# Patient Record
Sex: Male | Born: 1950 | Race: Black or African American | Hispanic: No | Marital: Married | Smoking: Former smoker
Health system: Southern US, Community
[De-identification: ages and names within clinical notes are randomized; demographics above are authoritative.]

## PROBLEM LIST (undated history)

## (undated) DIAGNOSIS — E1169 Type 2 diabetes mellitus with other specified complication: Secondary | ICD-10-CM

## (undated) DIAGNOSIS — Z0282 Encounter for adoption services: Secondary | ICD-10-CM

## (undated) DIAGNOSIS — E66811 Obesity, class 1: Secondary | ICD-10-CM

## (undated) DIAGNOSIS — E1149 Type 2 diabetes mellitus with other diabetic neurological complication: Secondary | ICD-10-CM

## (undated) DIAGNOSIS — M170 Bilateral primary osteoarthritis of knee: Secondary | ICD-10-CM

## (undated) DIAGNOSIS — I252 Old myocardial infarction: Secondary | ICD-10-CM

## (undated) DIAGNOSIS — Z8619 Personal history of other infectious and parasitic diseases: Secondary | ICD-10-CM

## (undated) DIAGNOSIS — I480 Paroxysmal atrial fibrillation: Secondary | ICD-10-CM

## (undated) DIAGNOSIS — N521 Erectile dysfunction due to diseases classified elsewhere: Secondary | ICD-10-CM

## (undated) DIAGNOSIS — I739 Peripheral vascular disease, unspecified: Secondary | ICD-10-CM

## (undated) DIAGNOSIS — I2510 Atherosclerotic heart disease of native coronary artery without angina pectoris: Secondary | ICD-10-CM

## (undated) DIAGNOSIS — E669 Obesity, unspecified: Secondary | ICD-10-CM

## (undated) DIAGNOSIS — Z951 Presence of aortocoronary bypass graft: Secondary | ICD-10-CM

## (undated) DIAGNOSIS — Z789 Other specified health status: Secondary | ICD-10-CM

## (undated) DIAGNOSIS — E785 Hyperlipidemia, unspecified: Secondary | ICD-10-CM

## (undated) DIAGNOSIS — E1142 Type 2 diabetes mellitus with diabetic polyneuropathy: Secondary | ICD-10-CM

## (undated) DIAGNOSIS — I340 Nonrheumatic mitral (valve) insufficiency: Secondary | ICD-10-CM

## (undated) DIAGNOSIS — I255 Ischemic cardiomyopathy: Secondary | ICD-10-CM

## (undated) DIAGNOSIS — I5032 Chronic diastolic (congestive) heart failure: Secondary | ICD-10-CM

## (undated) DIAGNOSIS — E1159 Type 2 diabetes mellitus with other circulatory complications: Secondary | ICD-10-CM

## (undated) DIAGNOSIS — D696 Thrombocytopenia, unspecified: Secondary | ICD-10-CM

## (undated) DIAGNOSIS — I10 Essential (primary) hypertension: Secondary | ICD-10-CM

## (undated) DIAGNOSIS — I152 Hypertension secondary to endocrine disorders: Secondary | ICD-10-CM

## (undated) DIAGNOSIS — I701 Atherosclerosis of renal artery: Secondary | ICD-10-CM

## (undated) HISTORY — DX: Bilateral primary osteoarthritis of knee: M17.0

## (undated) HISTORY — PX: TONSILLECTOMY: SUR1361

## (undated) HISTORY — DX: Other specified health status: Z78.9

## (undated) HISTORY — DX: Nonrheumatic mitral (valve) insufficiency: I34.0

## (undated) HISTORY — DX: Erectile dysfunction due to diseases classified elsewhere: N52.1

## (undated) HISTORY — DX: Paroxysmal atrial fibrillation: I48.0

## (undated) HISTORY — DX: Obesity, class 1: E66.811

## (undated) HISTORY — DX: Ischemic cardiomyopathy: I25.5

## (undated) HISTORY — DX: Type 2 diabetes mellitus with other circulatory complications: E11.59

## (undated) HISTORY — DX: Presence of aortocoronary bypass graft: Z95.1

## (undated) HISTORY — DX: Old myocardial infarction: I25.2

## (undated) HISTORY — DX: Personal history of other infectious and parasitic diseases: Z86.19

## (undated) HISTORY — DX: Thrombocytopenia, unspecified: D69.6

## (undated) HISTORY — DX: Atherosclerosis of renal artery: I70.1

## (undated) HISTORY — DX: Obesity, unspecified: E66.9

## (undated) HISTORY — DX: Type 2 diabetes mellitus with other diabetic neurological complication: E11.49

## (undated) HISTORY — DX: Atherosclerotic heart disease of native coronary artery without angina pectoris: I25.10

## (undated) HISTORY — PX: PLEURAL EFFUSION DRAINAGE: SHX5099

## (undated) HISTORY — DX: Essential (primary) hypertension: I10

## (undated) HISTORY — DX: Type 2 diabetes mellitus with diabetic polyneuropathy: E11.42

## (undated) HISTORY — DX: Encounter for adoption services: Z02.82

## (undated) HISTORY — DX: Hypertension secondary to endocrine disorders: I15.2

## (undated) HISTORY — DX: Peripheral vascular disease, unspecified: I73.9

## (undated) HISTORY — DX: Hyperlipidemia, unspecified: E78.5

## (undated) HISTORY — DX: Type 2 diabetes mellitus with other specified complication: E11.69

## (undated) HISTORY — DX: Chronic diastolic (congestive) heart failure: I50.32

---

## 1988-07-02 DIAGNOSIS — M170 Bilateral primary osteoarthritis of knee: Secondary | ICD-10-CM

## 1988-07-02 HISTORY — DX: Bilateral primary osteoarthritis of knee: M17.0

## 1988-07-02 HISTORY — PX: KNEE ARTHROSCOPY: SUR90

## 2004-07-02 DIAGNOSIS — E1169 Type 2 diabetes mellitus with other specified complication: Secondary | ICD-10-CM

## 2004-07-02 DIAGNOSIS — N521 Erectile dysfunction due to diseases classified elsewhere: Secondary | ICD-10-CM

## 2004-07-02 HISTORY — DX: Type 2 diabetes mellitus with other specified complication: E11.69

## 2004-07-02 HISTORY — DX: Type 2 diabetes mellitus with other specified complication: N52.1

## 2004-07-02 HISTORY — PX: VASCULAR SURGERY: SHX849

## 2005-05-10 ENCOUNTER — Ambulatory Visit (HOSPITAL_COMMUNITY): Admission: RE | Admit: 2005-05-10 | Discharge: 2005-05-10 | Payer: Self-pay | Admitting: Internal Medicine

## 2005-07-13 ENCOUNTER — Ambulatory Visit (HOSPITAL_COMMUNITY): Admission: RE | Admit: 2005-07-13 | Discharge: 2005-07-13 | Payer: Self-pay | Admitting: *Deleted

## 2005-08-07 ENCOUNTER — Inpatient Hospital Stay (HOSPITAL_COMMUNITY): Admission: RE | Admit: 2005-08-07 | Discharge: 2005-08-11 | Payer: Self-pay | Admitting: *Deleted

## 2005-10-12 ENCOUNTER — Ambulatory Visit (HOSPITAL_COMMUNITY): Admission: RE | Admit: 2005-10-12 | Discharge: 2005-10-12 | Payer: Self-pay | Admitting: *Deleted

## 2006-03-01 ENCOUNTER — Ambulatory Visit (HOSPITAL_COMMUNITY): Admission: RE | Admit: 2006-03-01 | Discharge: 2006-03-01 | Payer: Self-pay | Admitting: *Deleted

## 2006-03-15 ENCOUNTER — Ambulatory Visit (HOSPITAL_COMMUNITY): Admission: RE | Admit: 2006-03-15 | Discharge: 2006-03-15 | Payer: Self-pay | Admitting: *Deleted

## 2006-08-21 ENCOUNTER — Ambulatory Visit: Admission: RE | Admit: 2006-08-21 | Discharge: 2006-08-21 | Payer: Self-pay | Admitting: *Deleted

## 2006-09-16 ENCOUNTER — Observation Stay (HOSPITAL_COMMUNITY): Admission: RE | Admit: 2006-09-16 | Discharge: 2006-09-17 | Payer: Self-pay | Admitting: *Deleted

## 2006-09-16 ENCOUNTER — Ambulatory Visit: Payer: Self-pay | Admitting: *Deleted

## 2006-09-16 ENCOUNTER — Encounter (INDEPENDENT_AMBULATORY_CARE_PROVIDER_SITE_OTHER): Payer: Self-pay | Admitting: Specialist

## 2006-10-08 ENCOUNTER — Ambulatory Visit: Payer: Self-pay | Admitting: Vascular Surgery

## 2006-10-10 ENCOUNTER — Ambulatory Visit: Payer: Self-pay | Admitting: *Deleted

## 2006-10-21 ENCOUNTER — Ambulatory Visit: Payer: Self-pay | Admitting: *Deleted

## 2007-01-02 ENCOUNTER — Ambulatory Visit: Payer: Self-pay | Admitting: *Deleted

## 2007-01-16 ENCOUNTER — Ambulatory Visit: Payer: Self-pay | Admitting: *Deleted

## 2007-01-24 ENCOUNTER — Ambulatory Visit: Payer: Self-pay | Admitting: *Deleted

## 2007-01-24 ENCOUNTER — Ambulatory Visit (HOSPITAL_COMMUNITY): Admission: RE | Admit: 2007-01-24 | Discharge: 2007-01-24 | Payer: Self-pay | Admitting: *Deleted

## 2007-02-10 ENCOUNTER — Encounter: Admission: RE | Admit: 2007-02-10 | Discharge: 2007-02-10 | Payer: Self-pay | Admitting: Cardiology

## 2007-02-14 ENCOUNTER — Ambulatory Visit (HOSPITAL_COMMUNITY): Admission: RE | Admit: 2007-02-14 | Discharge: 2007-02-14 | Payer: Self-pay | Admitting: Cardiology

## 2007-02-14 ENCOUNTER — Ambulatory Visit: Payer: Self-pay | Admitting: *Deleted

## 2007-02-20 ENCOUNTER — Ambulatory Visit: Payer: Self-pay | Admitting: Thoracic Surgery (Cardiothoracic Vascular Surgery)

## 2007-02-21 ENCOUNTER — Ambulatory Visit (HOSPITAL_COMMUNITY): Admission: RE | Admit: 2007-02-21 | Discharge: 2007-02-21 | Payer: Self-pay | Admitting: *Deleted

## 2007-02-27 ENCOUNTER — Ambulatory Visit: Payer: Self-pay | Admitting: *Deleted

## 2007-03-03 DIAGNOSIS — Z951 Presence of aortocoronary bypass graft: Secondary | ICD-10-CM

## 2007-03-03 DIAGNOSIS — I255 Ischemic cardiomyopathy: Secondary | ICD-10-CM

## 2007-03-03 DIAGNOSIS — I2510 Atherosclerotic heart disease of native coronary artery without angina pectoris: Secondary | ICD-10-CM

## 2007-03-03 HISTORY — DX: Atherosclerotic heart disease of native coronary artery without angina pectoris: I25.10

## 2007-03-03 HISTORY — PX: CORONARY ARTERY BYPASS GRAFT: SHX141

## 2007-03-03 HISTORY — DX: Ischemic cardiomyopathy: I25.5

## 2007-03-03 HISTORY — DX: Presence of aortocoronary bypass graft: Z95.1

## 2007-03-11 ENCOUNTER — Inpatient Hospital Stay (HOSPITAL_COMMUNITY)
Admission: RE | Admit: 2007-03-11 | Discharge: 2007-03-15 | Payer: Self-pay | Admitting: Thoracic Surgery (Cardiothoracic Vascular Surgery)

## 2007-03-11 ENCOUNTER — Ambulatory Visit: Payer: Self-pay | Admitting: Thoracic Surgery (Cardiothoracic Vascular Surgery)

## 2007-03-29 DIAGNOSIS — I2510 Atherosclerotic heart disease of native coronary artery without angina pectoris: Secondary | ICD-10-CM | POA: Diagnosis present

## 2007-04-04 ENCOUNTER — Encounter
Admission: RE | Admit: 2007-04-04 | Discharge: 2007-04-04 | Payer: Self-pay | Admitting: Thoracic Surgery (Cardiothoracic Vascular Surgery)

## 2007-04-04 ENCOUNTER — Ambulatory Visit: Payer: Self-pay | Admitting: Thoracic Surgery (Cardiothoracic Vascular Surgery)

## 2007-04-10 ENCOUNTER — Encounter (HOSPITAL_COMMUNITY): Admission: RE | Admit: 2007-04-10 | Discharge: 2007-07-02 | Payer: Self-pay | Admitting: Cardiology

## 2007-04-24 ENCOUNTER — Ambulatory Visit: Payer: Self-pay | Admitting: *Deleted

## 2007-12-04 ENCOUNTER — Ambulatory Visit: Payer: Self-pay | Admitting: *Deleted

## 2007-12-26 ENCOUNTER — Ambulatory Visit: Payer: Self-pay | Admitting: *Deleted

## 2007-12-26 ENCOUNTER — Ambulatory Visit (HOSPITAL_COMMUNITY): Admission: RE | Admit: 2007-12-26 | Discharge: 2007-12-26 | Payer: Self-pay | Admitting: *Deleted

## 2008-01-22 ENCOUNTER — Ambulatory Visit: Payer: Self-pay | Admitting: *Deleted

## 2008-03-15 ENCOUNTER — Encounter (INDEPENDENT_AMBULATORY_CARE_PROVIDER_SITE_OTHER): Payer: Self-pay | Admitting: *Deleted

## 2008-03-15 ENCOUNTER — Ambulatory Visit: Payer: Self-pay | Admitting: *Deleted

## 2008-03-15 ENCOUNTER — Inpatient Hospital Stay (HOSPITAL_COMMUNITY): Admission: RE | Admit: 2008-03-15 | Discharge: 2008-03-16 | Payer: Self-pay | Admitting: *Deleted

## 2008-03-15 HISTORY — PX: FEMORAL ENDARTERECTOMY: SUR606

## 2008-04-01 ENCOUNTER — Ambulatory Visit: Payer: Self-pay | Admitting: *Deleted

## 2008-04-22 ENCOUNTER — Ambulatory Visit: Payer: Self-pay | Admitting: *Deleted

## 2008-06-07 ENCOUNTER — Ambulatory Visit: Payer: Self-pay | Admitting: *Deleted

## 2008-06-10 ENCOUNTER — Ambulatory Visit: Payer: Self-pay | Admitting: *Deleted

## 2008-07-21 IMAGING — CR DG CHEST 2V
2 series · 2 of 2 positions shown · non-contrast
Comparison: 07/11/05.

CLINICAL DATA: PPD.  Preadmission work-up.
 CHEST - 2 VIEW:

[view not recorded (1 of 2)]
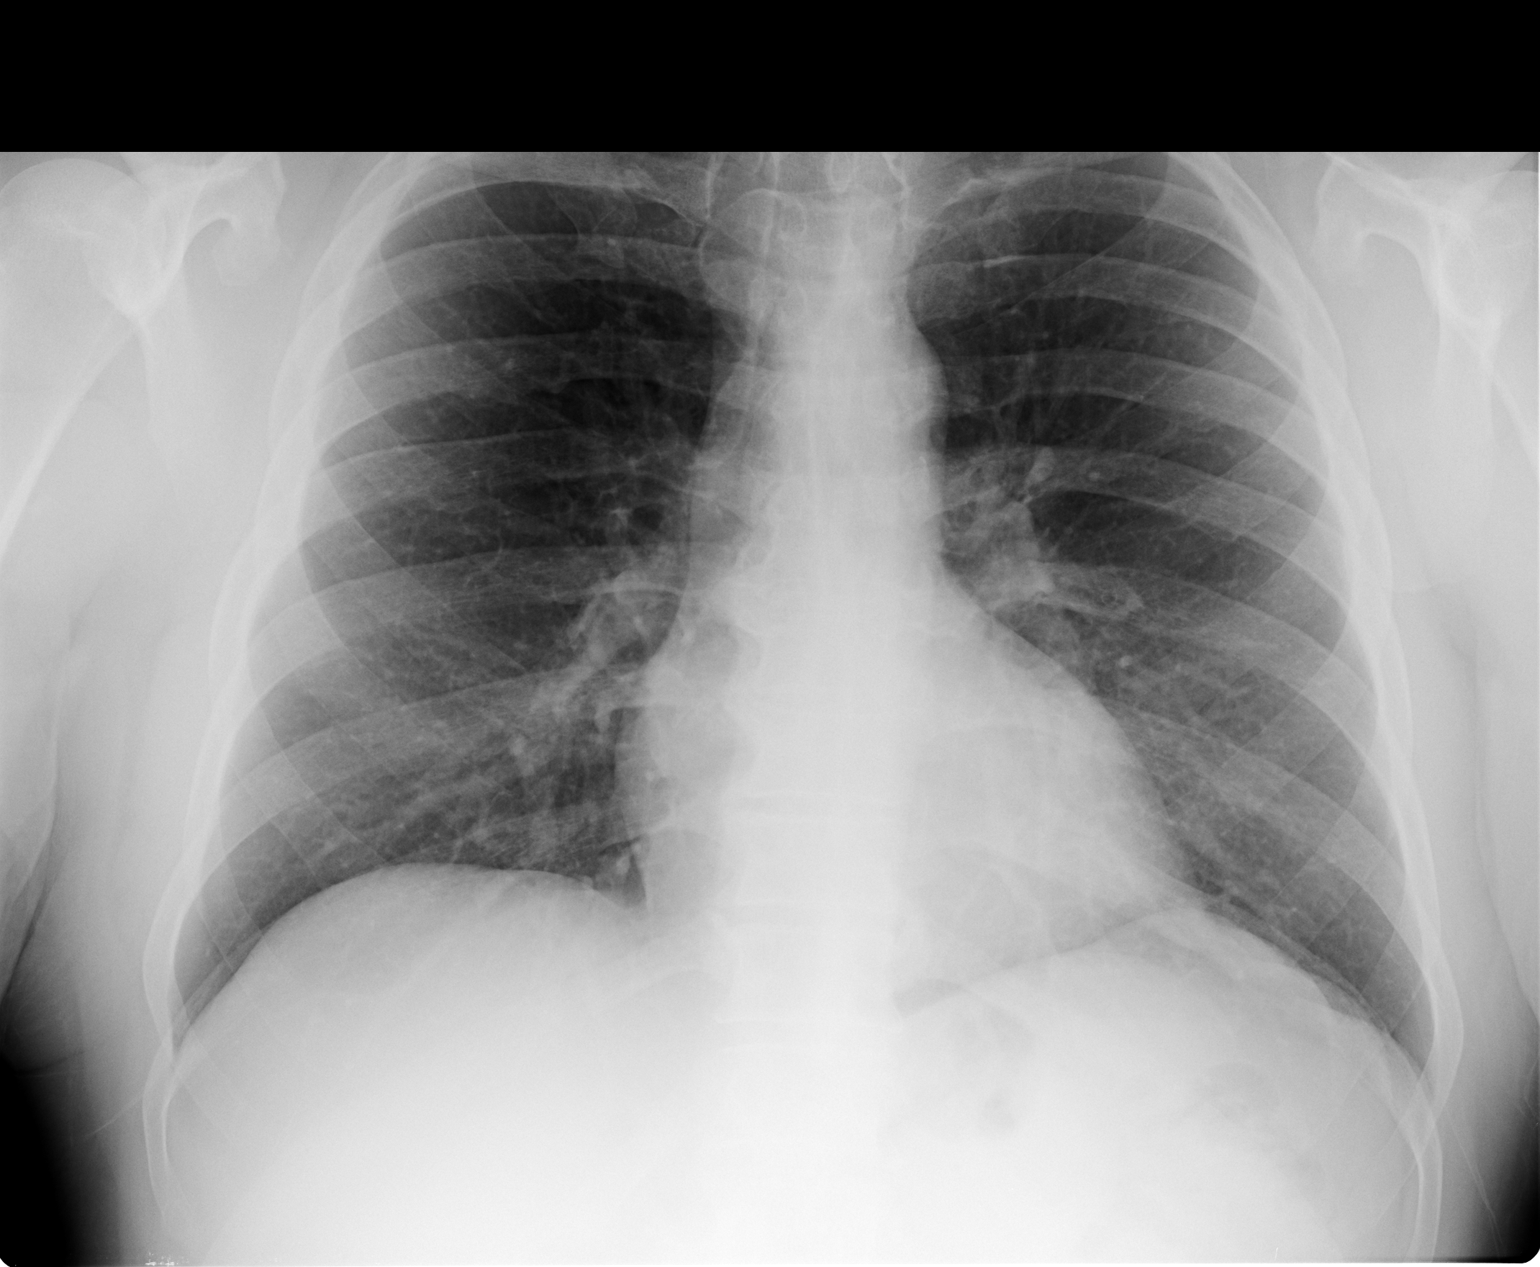

[view not recorded (2 of 2)]
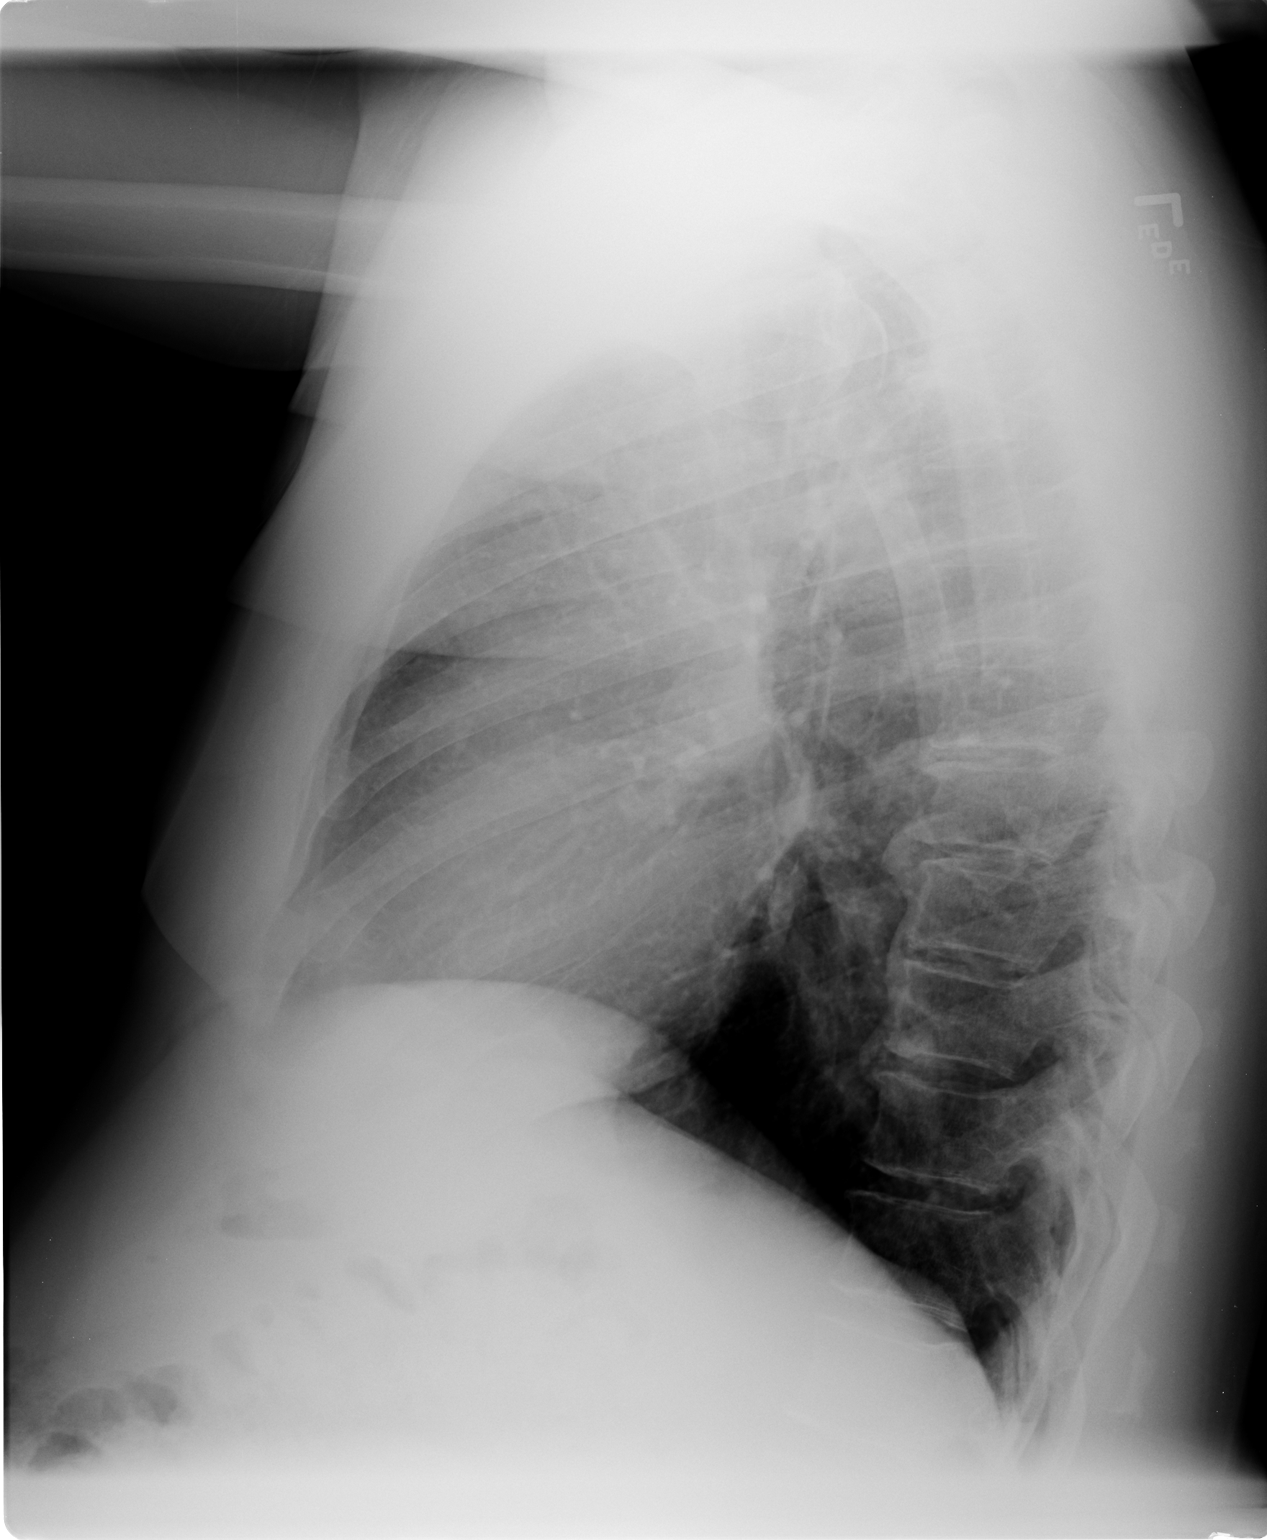

[2 of 2 positions shown; findings below may reference images not displayed]

FINDINGS: The heart size and mediastinal contours are within normal limits.  Both lungs are clear.  The visualized skeletal structures are unremarkable.
IMPRESSION: No active cardiopulmonary disease.

## 2008-09-28 ENCOUNTER — Ambulatory Visit: Payer: Self-pay | Admitting: *Deleted

## 2009-03-24 ENCOUNTER — Ambulatory Visit: Payer: Self-pay | Admitting: Vascular Surgery

## 2009-09-15 ENCOUNTER — Ambulatory Visit: Payer: Self-pay | Admitting: Vascular Surgery

## 2010-05-12 ENCOUNTER — Ambulatory Visit: Payer: Self-pay | Admitting: Vascular Surgery

## 2010-07-02 DIAGNOSIS — I252 Old myocardial infarction: Secondary | ICD-10-CM

## 2010-07-02 HISTORY — DX: Old myocardial infarction: I25.2

## 2010-07-22 ENCOUNTER — Encounter: Payer: Self-pay | Admitting: Internal Medicine

## 2010-11-14 NOTE — H&P (Signed)
NAME:  CUSTER, PIMENTA NO.:  [id number]   MEDICAL RECORD NO.:  [id number]          PATIENT TYPE:  INP   LOCATION:  NA                           FACILITY:  [facility]   PHYSICIAN:  Balinda Quails, M.D.    DATE OF BIRTH:  1951-06-05   DATE OF ADMISSION:  03/15/2008  DATE OF DISCHARGE:                              HISTORY & PHYSICAL   PRIMARY CARE PHYSICIAN:  Fleet Contras, MD   CARDIOLOGIST:  Cristy Hilts. Jacinto Halim, MD   ADMISSION DIAGNOSIS:  Bilateral claudication.   HISTORY:  William Pacheco is a 60 year old diabetic male with a history of  atherosclerotic vascular disease.  He has an ischemic cardiomyopathy and  has previously undergone coronary artery bypass.   He has a history of bilateral claudication.  He has had a right femoral-  popliteal bypass and placement of left superficial femoral artery  stents.  He developed symptoms of recurrent claudication in the lower  extremities.  Workup for this revealed evidence of common femoral  stenosis bilaterally verified by arteriography.  Admitted to the  hospital at this time for bilateral femoral endarterectomy.   PAST MEDICAL HISTORY:  1. Coronary artery disease, status post coronary artery bypass.  2. Peripheral vascular disease, status post right femoral-popliteal      bypass and left superficial femoral artery stenting.  3. Hypertension.  4. Hyperlipidemia.  5. Type 2 diabetes.  6. Remote history of tobacco abuse.   MEDICATIONS:  1. Ramipril 5 mg daily.  2. Simvastatin 80 mg daily.  3. Cilostazol 100 mg b.i.d.  4. Carvedilol 25 mg b.i.d.  5. Aspirin 325 mg daily.  6. Lantus insulin 45 units daily.  7. Glimepiride 4 mg daily.  8. Plavix 75 mg daily.  9. Avandamet 09/998 b.i.d.   ALLERGIES:  None known.   SOCIAL HISTORY:  The patient is married and works for [employer]  system.  He works in Production designer, theatre/television/film.  He has two children.  Not currently  smoking or using alcohol products.   FAMILY HISTORY:  This is  uncertain as he is adopted.   REVIEW OF SYSTEMS:  The patient's chief complaint is related to his  claudication bilaterally.  Denies any acute weight change.  No anorexia.  No nausea or vomiting.  No recent chest pain.  Does have mild shortness  of breath with exertion.  No cough or sputum production.  Denies  sensory, motor, or visual deficit.  No speech problems.  No urinary  tract symptoms.   PHYSICAL EXAMINATION:  GENERAL:  A 60 year old [place] American male,  alert and oriented.  No acute distress.  VITAL SIGNS:  Blood pressure is 120/70, pulse is 86 per minute and  regular, respirations 14 per minute.  HEENT:  Mouth and throat are clear.  Normocephalic.  NECK:  Supple.  No thyromegaly or adenopathy.  CHEST:  Equal air entry bilaterally.  No rales or rhonchi.  CARDIOVASCULAR:  Normal heart sounds.  No extra sounds or murmurs.  Regular rate and rhythm.  ABDOMEN:  Soft, nontender.  No masses or organomegaly.  Normal bowel  sounds.  EXTREMITIES:  No peripheral edema.  Right femoral scar.  Bilateral  femoral bruits.  1+ femoral pulse bilaterally.  1+ popliteal, posterior  tibial, dorsalis pedis pulse bilaterally.  NEUROLOGIC:  Cranial nerves intact.  Strength equal bilaterally.  Normal  reflexes.   IMPRESSION:  1. Bilateral claudication.  2. Coronary artery disease.  3. Type 2 diabetes.  4. Hypertension.  5. Hyperlipidemia.   PLAN:  The patient will be admitted electively to [facility] on  March 15, 2008, for planned bilateral femoral endarterectomy.      Balinda Quails, M.D.  Electronically Signed     PGH/MEDQ  D:  03/15/2008  T:  03/15/2008  Job:  [id number]   cc:   Fleet Contras, M.D.  Cristy Hilts. Jacinto Halim, MD

## 2010-11-14 NOTE — Assessment & Plan Note (Signed)
OFFICE VISIT   William Pacheco, William Pacheco  DOB:  Apr 27, 1951                                       05/12/2010  ZOXWR#:[id number]   HISTORY OF PRESENT ILLNESS:  This is a 60 year old gentleman who is  status post multiple bilateral leg revascularizations, most recently  right femoral to popliteal bypass and a left superficial femoral artery  stent.  He presents with a chief complaint now of short distance  claudication.  He notes even with walking from the grocery store to his  truck, he has pain in his calf that is consistent with claudication.  He  does not have rest pain symptoms, does not note any ulcerations or  gangrene in his feet.  He does not note anything specifically increases  this pain.  He primarily describes it as a tightness in his calves and  there is no radicular symptoms in his legs.   PAST MEDICAL HISTORY:  1. Peripheral arterial disease.  2. Hypertension.  3. Hyperlipidemia.  4. Diabetes.  5. Coronary artery disease.  6. Erectile dysfunction.  7. Renal artery stenosis.   PAST SURGICAL HISTORY:  Included a left iliofemoral endarterectomy, has  had a CABG, right femoral to popliteal bypass.  He has had a right knee  arthroscopy, tonsillectomy, adenoidectomy.   SOCIAL HISTORY:  He previously smoked up to 25 pack year history but  quit 2005.  He denies any alcohol or illicit drug use.   FAMILY HISTORY:  Unknown as he was adopted.   MEDICATIONS:  Ramipril, Zocor, Cilostazol, aspirin, Lantus, glimepiride,  Plavix, Aclophen.   ALLERGIES:  He has no known drug allergies.   REVIEW OF SYSTEMS:  Today he noted pain in legs with walking.  Also he  mentioned arthritis and joint pain.  Otherwise the rest of his 12 point  review of systems was noted be negative.   PHYSICAL EXAMINATION:  He had a blood pressure 146/82, heart rate 73,  respirations were 12.  He was satting 98% on room air.  GENERAL:  Well-developed, well-nourished, no apparent  distress.  HEAD:  Normocephalic, atraumatic.  ENT:  Nares had no erythema or drainage.  His hearing was grossly  intact.  Oropharynx without any erythema or exudate.  NECK:  Supple neck.  No nuchal rigidity.  No JVD.  No cervical  lymphadenopathy.  PULMONARY:  Symmetric expansion.  Good air movement.  No rhonchi, rales  or wheezes.  CARDIAC:  Regular rate rhythm.  Normal S1, S2.  No murmurs, rubs,  thrills or gallops.  VASCULAR:  Bilateral upper extremities have palpable pulses.  His  carotids were palpable without any obvious bruits.  GI:  He had a soft abdomen, nontender, nondistended, no guarding or  rebound.  No hepatosplenomegaly.  He is mildly obese.  I could  appreciate his aorta.  MUSCULOSKELETAL:  He had 5/5 strength in all extremities.  He had no  evidence of any gangrene or ulcers on any extremities.  He had incisions  that are consistent with the previous surgical scars.  NEUROLOGIC:  Cranial nerves II-XII were intact.  His motor exam was as  above.  Grossly sensation was intact in all extremities.  PSYCH:  He had good judgment and affect and mood were appropriate for  his situation.  SKIN:  The exam extremities are as listed above.  There were no rashes  noted any other locations.  LYMPHATIC:  He had no obviously palpable cervical, axillary or inguinal  lymphadenopathy.   Noninvasive vascular imaging:  He had bilateral ABIs completed which  demonstrated an ABI on the right side of 0.95, on the left 0.91.  There  were triphasic wave forms throughout.  Additionally, he had a bilateral  lower extremity duplex completed to survey his bypass graft on the right  which had patent flow throughout with maximal velocity of 167 c/s at the  proximal anastomosis within the left femoral popliteal system that is  widely patent with maximal velocity of 192 c/s.   MEDICAL DECISION MAKING:  This 59 year old gentleman is status post  multiple interventions on bilateral lower  extremities.  He has symptoms  that are consistent by history with claudication.  However, his ABIs  demonstrate triphasic wave forms throughout.  Additionally duplex shows  no obvious  areas of hemodynamically significant stenoses.  I offered  the patient an exercise ABI and screening for possible popliteal  entrapment but at this point, he declines such testing and he would  rather try to manage this conservatively.  I have discussed the findings  with the patient and he is going to continue maximal medical management.  Additionally, he is going to continue ABIs and duplexes every 6 months  of this point.  We discussed the plan and he agrees to proceed forth  with such.     [name], MD  Electronically Signed   BC/MEDQ  D:  05/12/2010  T:  05/15/2010  Job:  [id number]

## 2010-11-14 NOTE — Assessment & Plan Note (Signed)
OFFICE VISIT   William, Pacheco  DOB:  09-26-1950                                       06/10/2008  AVWUJ#:[id number]   The patient underwent left external iliac and common femoral  endarterectomy with Dacron patch angioplasty March 15, 2008, at  [facility].  He has recovered well since that procedure.  Notes  marked improvement in his left lower extremity claudication symptoms.  Left ABI measures 0.81, right ABI measures 0.73.   Blood pressure is 139/84, pulse is 94 per minute.  Groin incisions are  well-healed bilaterally.  Soft right femoral bruit present.  Feet are  well-perfused.   The patient is doing reasonably well at present.  Previous evaluation  has revealed evidence of left external iliac common femoral stenosis.  This represents recurrent disease.  His symptoms are minimal at present  and will plan to follow this with a repeat scan in 3 months.   Balinda Quails, M.D.  Electronically Signed   PGH/MEDQ  D:  06/10/2008  T:  06/11/2008  Job:  [id number]   cc:   Cristy Hilts. Jacinto Halim, MD  Fleet Contras, M.D.

## 2010-11-14 NOTE — Procedures (Signed)
BYPASS GRAFT EVALUATION   INDICATION:  Bilateral bypass grafts.   HISTORY:  Diabetes:  Yes.  Cardiac:  CABG.  Hypertension:  Yes.  Smoking:  Previous.  Previous Surgery:  Right fem to popliteal bypass graft, left superficial  femoral artery stent 01/24/2007.   SINGLE LEVEL ARTERIAL EXAM                               RIGHT              LEFT  Brachial:                    130                147  Anterior tibial:             118                119  Posterior tibial:            116                81  Peroneal:  Ankle/brachial index:        0.80               0.81   PREVIOUS ABI:  Date:  03/24/2009  RIGHT:  0.71  LEFT:  0.76   LOWER EXTREMITY BYPASS GRAFT DUPLEX EXAM:   DUPLEX:  Triphasic and biphasic Doppler waveforms throughout right  bypass graft and native arteries, left native arteries post  endarterectomy.   IMPRESSION:  1. Patent bypass graft with no evidence of stenosis.  2. Patent left arteries with evidence of no stenosis post      endarterectomy.  3. Ankle brachial index suggests mild disease, but has increased since      previous study.        ___________________________________________  Di Kindle. Edilia Bo, M.D.   CJ/MEDQ  D:  09/15/2009  T:  09/15/2009  Job:  [id number]

## 2010-11-14 NOTE — Letter (Signed)
April 04, 2007   Cristy Hilts. Jacinto Halim, MD  [address]  [place], [place] [address]   Re:  [name], [name]               DOB:  11-May-1951   Dear Vonna Kotyk:   I saw William Pacheco back in the office today.  As you know, he is a very  nice 60 year old gentleman who underwent coronary artery bypass grafting  x6 on September 9th.  Postoperatively he did well.  He now returns for  followup and I think overall he is making good progress.  He is only  about 3 and 1/2 weeks out and his exercise tolerance is good, he is not  having any anginal type pain.  He did have some irritation of his left  forearm incision and was given antibiotics for that.  That now looks  clean and without signs of infection but does have a little bit of  eschar formation but should heal up over the next couple of weeks.  From  a pain standpoint, he is really doing quite well, particularly for a  younger man, he is only taking Tylenol for his discomfort.  He does  complain of some light sensitivity about 5 to 6 hours after taking  multiple medications.  I asked him to separate those out to see if we  can narrow down what may be causing that.  Again, thank you very much  for allowing me to see William Pacheco and participate in his care.   Salvatore Decent Dorris Fetch, M.D.  Electronically Signed   SCH/MEDQ  D:  04/04/2007  T:  04/04/2007  Job:  [id number]   cc:   Fleet Contras, M.D.  Balinda Quails, M.D.

## 2010-11-14 NOTE — Procedures (Signed)
BYPASS GRAFT EVALUATION   INDICATION:  Followup, right lower extremity bypass graft and left lower  extremity arterial stenting.   HISTORY:  Diabetes:  Yes.  Cardiac:  CABG in 2008.  Hypertension:  Yes.  Smoking:  Quit.  Previous Surgery:  Right fem-pop bypass graft with Gore-Tex on 08/07/05,  right femoral endarterectomy on 09/16/06.  Left superficial femoral  artery atherectomy in September, 2007.  Left superficial femoral artery  PTA and stent on 01/24/07 by Dr. Madilyn Fireman.   SINGLE LEVEL ARTERIAL EXAM                               RIGHT              LEFT  Brachial:                    136                134  Anterior tibial:             78                 70  Posterior tibial:            90                 76  Peroneal:  Ankle/brachial index:        0.66               0.57   PREVIOUS ABI:  Date: 04/24/07  RIGHT:  0.65  LEFT:  0.62   LOWER EXTREMITY BYPASS GRAFT DUPLEX EXAM:   DUPLEX:  1. Doppler arterial waveforms appear borderline biphasic/monophasic      bilaterally down to the popliteal artery, including within the      right fem-pop bypass graft.  2. Elevated velocities noted in bilateral common femoral artery; R =      406 cm/s, L = 420 cm/s.  3. Elevated velocities in left SFA P/M of 215 cm/s.   IMPRESSION:  1. Ankle brachial indices appear fairly stable.  2. Patent right femoral-popliteal artery bypass graft.  3. Elevated velocities in bilateral common femoral arteries suggestive      of >75% stenosis.  4. Elevated velocities in left superficial femoral artery suggestive      of >50% stenosis.     ___________________________________________  P. Liliane Bade, M.D.   AS/MEDQ  D:  12/04/2007  T:  12/04/2007  Job:  [id number]

## 2010-11-14 NOTE — Assessment & Plan Note (Signed)
OFFICE VISIT   YON, SCHIFFMAN  DOB:  [birth year]                                       03/24/2009  EAVWU#:[id number]   I saw the patient in the office today as an add on.  This is a patient  of Dr. Madilyn Fireman who has had previous lower extremity bypass.  He wanted to  be seen today because he states that approximately 5 weeks ago he noted  numbness in his right hand which has been fairly persistent over the  last 5 weeks and he was concerned about this.  When he came in for a  routine protocol study today he had mentioned this to the lab tech and  then was added on.  The patient has no history of atrial fibrillation or  recent MRI.  He has undergone previous coronary revascularization 2  years ago.  He denies any history of stroke, TIAs, expressive or  receptive aphasia or amaurosis fugax.  He does not remember any specific  trauma to the arm or injury.  He does work as a Arboriculturist and had been  doing a fair amount of lifting recently although again he does not  remember any specific injury.   REVIEW OF SYSTEMS:  He has had no recent fever, chills.  He has had no  chest pain, chest pressure, palpitations or arrhythmias.  He has had no  productive cough, bronchitis, asthma or wheezing.   PHYSICAL EXAMINATION:  This a pleasant 60 year old gentleman who appears  his stated age.  Blood pressure is 107/73, heart rate is 88.  I do not  detect any carotid bruits.  Lungs are clear bilaterally to auscultation.  On cardiac exam he has a regular rate and rhythm.  He has a palpable  brachial and radial pulse bilaterally.  He has a biphasic radial ulnar  and palmar arch signal on the right and the right hand is warm and well-  perfused.  On the left side he has had previous radial harvesting for  his coronary revascularization.  He has a biphasic ulnar and palmar arch  signal on the left.  He has good strength in the upper extremities and  lower extremities  bilaterally.   I have explained to him that I do not think his symptoms are related to  an atheroembolic event.  He has excellent circulation in the right hand.  We have discussed the possibility of a small stroke and I have discussed  obtaining a CT scan of the head and carotid duplex scan.  He would like  to give this a few more weeks before agreeing to proceed with these  studies as he thinks potentially it is related to simply his work.  If  his symptoms do not resolve then I do think it would be useful to get a  CT scan of the head to rule out a small stroke and also a carotid duplex  scan although I do not appreciate any carotid bruits.  If his symptoms  persist and these studies are negative then he will likely have to be  seen by the neurologist for peripheral nerve evaluation.   Di Kindle. Edilia Bo, M.D.  Electronically Signed   CSD/MEDQ  D:  03/24/2009  T:  03/25/2009  Job:  [id number]   cc:   Fleet Contras, M.D.

## 2010-11-14 NOTE — Assessment & Plan Note (Signed)
OFFICE VISIT   William Pacheco, William Pacheco  DOB:  06/14/1951                                       04/01/2008  ZOXWR#:[id number]   The patient underwent left external iliac and common femoral  endarterectomy with Dacron patch angioplasty 03/15/2008.  He has been  recovering well since surgery.  Noted improvement in symptoms in his  left lower extremity.   Ankle brachial indices have improved to 0.81 in the left leg and 0.73 in  the right leg from preoperative values of 0.57 and 0.66 respectively.  Biphasic arterial waveforms are noted in the vessels bilaterally.   BP is 145/87, pulse 90 per minute.  Left groin incision healing  unremarkably.   The patient is doing well following his recent surgery.  We will plan  followup in a couple of months with repeat Doppler evaluation and  reevaluation of symptoms at that time.   Balinda Quails, M.D.  Electronically Signed   PGH/MEDQ  D:  04/01/2008  T:  04/03/2008  Job:  [id number]   cc:   Cristy Hilts. Jacinto Halim, MD  Fleet Contras, M.D.

## 2010-11-14 NOTE — Assessment & Plan Note (Signed)
OFFICE VISIT   SHO, SALGUERO  DOB:  1950-09-07                                        April 04, 2007  CHART #:  [id number]   [name] is a 60 year old gentleman who had coronary bypass grafting  x6 on September 9th.  We did left mammary and a left radial as well as  some saphenous vein.  We took saphenous vein from his left leg which  turned out to be acceptable.  He had had previously noted small vein in  the right leg.  [name] states that he still has some soreness, he  also has some numbness in his left hand, particularly over the thumb and  the distal part of his wrist.  He has been taking Tylenol at night but  has not been taking any narcotic pain medication.  He was started on  some antibiotics because of redness and some drainage from his radial  artery incision that has improved.   PHYSICAL EXAMINATION:  [name] is a 60 year old gentleman in no  acute distress.  The blood pressure is 133/81, pulse 95, respirations  18, his oxygen saturation is 98% on room air.  Lungs:  Clear with equal  breath sounds.  Cardiac Exam:  Has a regular rate and rhythm with normal  S1 and S2.  No rubs, murmurs or gallops.  His sternal incision is clean,  dry and intact.  Sternotomy is stable.  He does have some open wounds at  the chest tube site, no evidence of infection.  He has some eschar  formation on his left arm incision; again, no evidence of infection.  His leg wounds are healing well.   Chest x-ray shows good aeration of the lungs bilaterally.   IMPRESSION:  [name] is a 60 year old gentleman.  He is status post  coronary artery bypass grafting times six.  At this point in time he is  doing very well.  He has mild to moderate discomfort which is controlled  with Tylenol.  He is not requiring narcotics.  He is walking without  difficulty.  He is going to start the cardiac rehab program next week.  He says his blood sugars have been  running in the 120 to 160 range.  I  would not make any change in his diabetic regimen today.  I did tell him  that it was okay to begin driving, appropriate precautions were  discussed.  He is not to lift any objects weighing greater than 10  pounds for another three weeks.   In addition, [name] tells me that he has had some episodes of  light sensitivity around 5 to 6 o'clock in the afternoon.  He says he  takes 6 to 8 medications at noon every day and then between 5 and 6  o'clock every day he notices if he is watching television it becomes  very bright and irritating.  It is hard to narrow down what is causing  this.  I recommended that he split up his medications, take some around  9 a.m. and others around noon to see if the timing of this changes.  I  am not sure if this is an interaction or a specific medication  sensitivity, but hopefully that will help [place] narrow it down.   From a long term cardiac standpoint,  he will continue to be followed by  Dr. Yates Decamp.  I would be happy to see him back at any time.  Dr.  Concepcion Elk will continue to be his medical doctor.   Salvatore Decent Dorris Fetch, M.D.  Electronically Signed   SCH/MEDQ  D:  04/04/2007  T:  04/04/2007  Job:  [id number]   cc:   Cristy Hilts. Jacinto Halim, MD  Fleet Contras, M.D.  Balinda Quails, M.D.

## 2010-11-14 NOTE — Cardiovascular Report (Signed)
NAME:  William Pacheco, STALLBAUMER NO.:  [id number]   MEDICAL RECORD NO.:  [id number]          PATIENT TYPE:  AMB   LOCATION:  ENDO                         FACILITY:  [facility]   PHYSICIAN:  Darlin Priestly, MD  DATE OF BIRTH:  1951/06/07   DATE OF PROCEDURE:  02/21/2007  DATE OF DISCHARGE:                            CARDIAC CATHETERIZATION   PROCEDURE:  Transesophageal echocardiogram.   COMPLICATIONS:  None.   INDICATION:  [name] is a 60 year old male patient of Dr. Yates Decamp  with a history of diabetes, history of PVD status post right  femoropopliteal bypass, history of a recent cardiac catheterization on  February 14, 2007, revealing the significant left main disease as well as  an LAD and RCA disease with an EF of 40-45%.  He was noted at the time  to have at least a moderate mitral regurgitation.  He is now here for a  transesophageal echocardiogram to further evaluate his mitral valve.   DESCRIPTION OF OPERATION:  After obtaining informed written consent, the  patient was brought to the endoscopy suite in a fasting state.  He then  underwent successful and uncomplicated transesophageal echocardiogram.   The left ventricle was mildly dilated with normal thickness.  EF  approximately 40-45% with mild global hypokinesis.   Structurally normal aortogram with trivial aortic regurgitation.   Mildly thickened anterior and posterior mitral valve leaflets.  There  was mild calcification in the subvalvular apparatus.  There appears to  be mild to moderate mitral regurgitation.  There is no significant  mitral stenosis.   Structurally normal tricuspid valve with mild tricuspid regurgitation.   Mild pulmonary hypertension with an estimated PA pressure of 40-45 mmHg.   No evidence of a PFO by color or contrast echo.   Normal descending thoracic aorta.   CONCLUSIONS:  Successful transesophageal echocardiogram and findings  noted above.  There appears to be  mild/moderate mitral regurgitation  with no evidence of significant mitral stenosis with mildly depressed  left ventricular systolic function.      Darlin Priestly, MD  Electronically Signed     RHM/MEDQ  D:  02/21/2007  T:  02/22/2007  Job:  [id number]   cc:   Cristy Hilts. Jacinto Halim, MD

## 2010-11-14 NOTE — Op Note (Signed)
NAME:  JOSEPHUS, HARRIGER NO.:  [id number]   MEDICAL RECORD NO.:  [id number]          PATIENT TYPE:  INP   LOCATION:  3309                         FACILITY:  [facility]   PHYSICIAN:  Balinda Quails, M.D.    DATE OF BIRTH:  11/28/1950   DATE OF PROCEDURE:  03/15/2008  DATE OF DISCHARGE:                               OPERATIVE REPORT   SURGEON:  Balinda Quails, MD   ASSISTANT:  Jerold Coombe, PA   ANESTHESIA:  General endotracheal.   ANESTHESIOLOGIST:  Sheldon Silvan, MD   PREOPERATIVE DIAGNOSIS:  Bilateral lower extremity claudication.   POSTOPERATIVE DIAGNOSIS:  Bilateral lower extremity claudication.   PROCEDURE:  Left external iliac and common femoral endarterectomy with  Dacron patch angioplasty.   CLINICAL NOTE:  Mr. Chittum is a 60 year old diabetic male with  advanced atherosclerotic disease, history of coronary artery bypass and  right femoral-popliteal bypass.  He has developed recurrent  claudication.  Workup for this revealed extensive common femoral  stenosis bilaterally.  Initial plan was to perform bilateral femoral  endarterectomy.   PROCEDURE NOTE:  The patient brought to the operating room in stable  condition.  Placed under general endotracheal anesthesia.  Arterial line  inserted.  Foley catheter present.   Both legs prepped and draped in a sterile fashion in the supine  position.   Longitudinal skin incision made through the left groin.  Dissection  carried down through the subcutaneous tissue with electrocautery.  Deep  dissection carried down through the subcutaneous tissue and lymphatics.  Bridging veins ligated with 3-0 silk and divided.  The left common  femoral artery was quite stuck from previous catheterization procedures.  Extensive plaque was palpable.  The left common femoral artery was  exposed, freed, encircled with a vessel loop proximally.  The plaque  extended up into the external iliac artery.  The external iliac artery  was exposed under the inguinal ligament.  The inguinal ligament  mobilized.  A vessel loop was then encircled around the external iliac  artery and the retroperitoneum.  Tributaries were controlled with loops.  Dissection carried down to the origin of the profunda and superficial  femoral arteries, which were each encircled with vessel loops.   The patient administered 5000 units of heparin intravenously.  Adequate  circulation time permitted.  The left external iliac artery controlled  proximally with a Gregory clamp.  The profunda and superficial femoral  artery controlled with bulldog clamps.  A Longitudinal arteriotomy made  in the common femoral artery.  The arteriotomy then extended proximally  up into the external iliac artery.  There was extensive plaque with near  total occlusion of the left common femoral artery.   The arteriotomy was extended out onto the superficial femoral artery.  The left common femoral artery and external iliac were then  endarterectomized with an elevator.  This was carried down into the  origin of the superficial femoral artery over the plaque, was feathered  out.  The profunda plaque was also feathered out with eversion.  The  site irrigated with heparin saline solution.   A  patch angioplasty to the left external iliac and common femoral artery  was then carried out with a Finesse Dacron patch using running 6-0  Prolene suture.   At completion of the patch angioplasty, all the vessels were flushed.  Clamps were then removed with excellent flow present.  Adequate  hemostasis obtained.  The patient administered 30 mg of protamine  intravenously.   The groin incisions irrigated with copious amounts of saline solution.  The subcutaneous tissue then closed with a running 2-0 Vicryl suture in  2 layers and 3-0 Vicryl suture.  The skin was closed with Monocryl and  Dermabond.  Sterile dressings applied.   The patient tolerated the procedure well.  No  apparent complications.  The patient transferred to recovery room in stable condition.      Balinda Quails, M.D.  Electronically Signed     PGH/MEDQ  D:  03/15/2008  T:  03/16/2008  Job:  [id number]   cc:   Cristy Hilts. Jacinto Halim, MD  Fleet Contras, M.D.

## 2010-11-14 NOTE — Procedures (Signed)
BYPASS GRAFT EVALUATION   INDICATION:  Follow-up evaluation of right leg bypass graft and left  lower extremity arterial stenting.   HISTORY:  Diabetes:  Yes.  Cardiac:  Coronary artery bypass graft six weeks ago.  Hypertension:  No.  Smoking:  Quit in January, 2007.  Previous Surgery:  Right fem-pop bypass graft with Gore-Tex on August 07, 2005; right femoral endarterectomy on September 16, 2006.  Left  superficial femoral artery atherectomy in September, 2007.  Left  superficial femoral artery PTA and stent on January 24, 2007 by Dr. Madilyn Fireman.   SINGLE LEVEL ARTERIAL EXAM                               RIGHT              LEFT  Brachial:                    136                128  Anterior tibial:             88                 78  Posterior tibial:            80                 84  Peroneal:  Ankle/brachial index:        0.65               0.62   PREVIOUS ABI:  Date: 01/02/2007  RIGHT:  0.67  LEFT:  0.48   LOWER EXTREMITY BYPASS GRAFT DUPLEX EXAM:   DUPLEX:  On the right, waveforms are biphasic proximal to, within, and  distal to the bypass graft with an elevated peak systolic velocity of  264 cm/s on the right common femoral artery.  On the left, waveforms are  biphasic throughout the common femoral artery, superficial femoral  artery, and popliteal artery with an elevated peak systolic velocity of  303 cm/s in the left common femoral artery (previous study performed  prior to the stenting on January 02, 2007, the left common femoral artery,  superficial femoral artery, and popliteal artery were all monophasic).   IMPRESSION:  1. Right ankle brachial index is stable from previous study.  2. Left ankle brachial index is higher than previously recorded.  3. Elevated peak systolic velocity in the common femoral arteries      bilaterally.  4. Patent right femoropopliteal bypass graft.  5. No evidence of significant in-stent restenosis in the left      superficial  femoral artery.   ___________________________________________  P. Liliane Bade, M.D.   MC/MEDQ  D:  04/24/2007  T:  04/25/2007  Job:  [id number]

## 2010-11-14 NOTE — Op Note (Signed)
NAME:  William Pacheco, William Pacheco               ACCOUNT NO.:  [id number]   MEDICAL RECORD NO.:  [id number]          PATIENT TYPE:  AMB   LOCATION:  SDS                          FACILITY:  [facility]   PHYSICIAN:  Balinda Quails, M.D.    DATE OF BIRTH:  1950/07/12   DATE OF PROCEDURE:  12/26/2007  DATE OF DISCHARGE:                               OPERATIVE REPORT   ATTENDING PHYSICIAN:  Balinda Quails, MD   DIAGNOSIS:  Bilateral lower extremity claudication.   PROCEDURE:  Abdominal aortogram with bilateral lower extremity runoff  arteriography.   ACCESS:  Left common femoral artery with 5-French sheath.   CONTRAST:  185 mL Visipaque.   COMPLICATIONS:  None apparent.   CLINICAL NOTE:  William Pacheco is a 60 year old male vasculopath with a  history of peripheral vascular disease and coronary artery disease.  He  is a diabetic.  He has previously undergone a right femoral-popliteal  bypass and also has a history of right femoral endarterectomy.  He has  had 2 stents placed in left superficial femoral artery after failed  atherectomy.   He is brought to the cath lab at this time for diagnostic arteriography  due to Doppler findings of high-grade femoral artery stenoses  bilaterally and increasing claudication symptoms.   PROCEDURE NOTE:  The patient was brought to the cath lab in stable  condition.  Placed in supine position.  Both legs were prepped and  draped in a sterile fashion.  Administered 50 mcg of fentanyl  intravenously.   The left common femoral artery was accessed with an 18 gauge needle.  This was difficult to access due to a diseased left common femoral  artery.  A hand injection of contrast had to be made through the needle  and in the left common femoral artery to delineate the anatomy,  eventually with some difficulty a 0.035 Magic torque guidewire was  advanced into the abdominal aorta.  The needle removed, 5-French sheath  advanced over guidewire, the dilator removed, and sheath  flushed with  heparin and saline solution.   A standard pigtail catheter was then advanced over the guidewire to the  mid abdominal aorta.  The patient was administered 2000 units of heparin  intravenously due to a concern for occlusion of the left common femoral  artery from the sheath.   A standard AP abdominal aortogram was obtained.  This revealed the  superior mesenteric artery to be widely patent.  The renal arteries  bilaterally had mild proximal plaque without dominant stenosis.  The  infrarenal aorta was widely patent.  The pigtail catheter was brought  down to the aortic bifurcation and bilateral oblique pelvic  arteriography was obtained.  The common iliac, external iliac, and  hypogastric arteries were widely patent bilaterally.   The pigtail catheter then hooked on the aortic bifurcation and the Magic  torque guidewire advanced down into the right external iliac artery.  The pigtail catheter removed and an end-hole catheter advanced into the  right external iliac artery.  Right lower extremity arteriography  obtained.  The right common femoral artery revealed 2 areas  of distinct  high-grade stenosis.  The right femoral-popliteal bypass Gore-Tex graft  was patent down to the below-knee popliteal artery.  The right profunda  femoris artery was intact and the right superficial femoral artery  occluded.  The patent right femoral-popliteal graft revealed a wide open  anastomosis to the popliteal artery.  Runoff in the right leg was via  moderately diseased tibioperoneal trunk, peroneal and anterior tibial  arteries.  The posterior tibial artery occluded in the right calf.   The end-hole catheter was then removed.  Retrograde injection made  through the left femoral sheath to obtain left lower extremity  arteriography.  This did reveal plaque with a high-grade stenosis of  left common femoral artery.  The left profunda femoris artery was  patent.  Two areas of stenting were  identified in the left superficial  femoral artery with mild restenosis within the stent.  The left  popliteal artery was patent with intact 3-vessel runoff to the left  foot.  There was aberrant takeoff of the left anterior tibial artery.   This completed the arteriogram procedure.  No apparent complications.  The patient transferred to the holding area in stable condition.   FINAL IMPRESSION:  1. Mild stenosis of single renal arteries bilaterally.  2. Patent aortoiliac segment.  3. Moderate-to-severe bilateral common femoral artery stenosis.  4. Patent right femoral-popliteal bypass.  5. Patent superficial femoral artery stents, left leg, x2 with mild in-      stent restenosis.  6. Moderate tibial vessel disease bilaterally.   DISPOSITION:  The results will be reviewed further with the patient and  family, he will require bilateral femoral endarterectomy for treatment  of recurrent disease.      Balinda Quails, M.D.  Electronically Signed     PGH/MEDQ  D:  12/26/2007  T:  12/27/2007  Job:  [id number]   cc:   William Hilts. Jacinto Halim, MD

## 2010-11-14 NOTE — Op Note (Signed)
NAME:  MONTREAL, STEIDLE NO.:  [id number]   MEDICAL RECORD NO.:  [id number]          PATIENT TYPE:  INP   LOCATION:  2310                         FACILITY:  [facility]   PHYSICIAN:  Salvatore Decent. Hendrickson, M.D.DATE OF BIRTH:  1951/03/23   DATE OF PROCEDURE:  03/11/2007  DATE OF DISCHARGE:                               OPERATIVE REPORT   PREOPERATIVE DIAGNOSIS:  Left main and three-vessel coronary disease  with ischemic cardiomyopathy and mild to moderate mitral regurgitation.   POSTOPERATIVE DIAGNOSIS:  Left main and three-vessel coronary disease  with ischemic cardiomyopathy and mild to moderate mitral regurgitation.   PROCEDURE:  Median sternotomy, extracorporeal circulation, coronary  bypass grafting x6 (left internal mammary artery to left anterior  descending, left radial artery to first diagonal, saphenous vein graft  to obtuse marginals one and two, saphenous vein graft to posterior  descending and posterior lateral), endoscopic vein harvest left leg,  open radial artery harvest.   SURGEON:  Salvatore Decent. Dorris Fetch, M.D.   FIRST ASSISTANT:  Theda Belfast, PA   SECOND ASSISTANT:  Abbie Sons, SA   ANESTHESIA:  General.   FINDINGS:  Transesophageal echocardiography revealed global hypokinesis  improved post bypass.  There was mild mitral regurgitation of 1 to at  most 2+ even with provocative maneuvers.  OM1 and 2 were fair-quality  targets.  Remaining targets good-quality.   CLINICAL NOTE:  Mr. Barot is a 60 year old gentleman with a history  of atherosclerotic cardiovascular disease and multiple cardiac risk  factors presenting with a complaint of shortness of breath.  He does  have diabetes.  At catheterization he had left main and severe three-  vessel coronary disease and was referred for coronary bypass grafting.  The indications, risks, benefits and alternatives were discussed in  detail with the patient.  He understood and accepted risks  and agreed to  proceed.  Plan was to the evaluate the left radial artery as well as the  left greater saphenous vein.  He had previously had the vein on the  right side harvested for a peripheral bypass procedure.  Therefore, it  was not available.   OPERATIVE NOTE:  Mr. Detweiler was brought to the preop holding area on  March 11, 2007.  There the anesthesia service under direction of Dr.  Laverle Hobby placed lines for monitoring arterial, central venous and  pulmonary arterial pressure.  Intravenous antibiotics were administered.  He was taken to the operating room, anesthetized and intubated.  A Foley  catheter was placed.  Transesophageal echocardiography was performed.  It revealed global hypokinesis.  There was 1+ MR, with provocative  maneuvers this may have increased slightly 2+ but there was no moderate  severe or severe mitral regurgitation.  There was normal leaflet  morphology with no prolapse.  Decision was made not to do a mitral  annuloplasty based on these findings.  The chest, abdomen and legs and  left arm were prepped and draped in usual fashion.  An incision was made  on the left wrist on the volar aspect of the wrist overlying the radial  pulse.  A  short incision was made in the distal aspect of the wrist to  begin with and a short segment of the radial artery was mobilized.  Bulldog clamp was placed proximally and there was a good pulse with  distal occlusion confirming the findings of the preoperative Allen's  test as well as the plethysmography which showed adequate cross  circulation.  The incision was extended to just below the antecubital  fossa and the radial artery dissection was carried out using the  harmonic scalpel.  Simultaneously incision in the medial aspect of the  left leg at the level of the knee and the greater saphenous vein was  harvested endoscopically from groin to mid calf.  The saphenous vein was  of fair quality.   5000 units of heparin  was given prior to dividing the distal end of the  radial artery and while harvesting the saphenous vein, the radial artery  was also divided proximally.  The proximal and distal stumps were suture  ligated with 4-0 Prolene sutures.  The incision was irrigated and closed  in two layers with running 3-0 Vicryl subcutaneous suture and a running  3-0 Vicryl subcuticular suture.   A median sternotomy was performed and the left internal mammary artery  was harvested using standard technique.  This was also good quality  conduit.  The remainder of the full heparin dose was given prior to  dividing the distal end of the left mammary artery.   The pericardium was opened.  The ascending aorta was inspected and  palpated.  The aorta was relatively small for the patient's size, but  was free of any evidence of atherosclerotic disease.  The aorta was  cannulated via concentric 2-0 Ethibond pledgeted pursestring sutures. A  dual stage venous cannula was placed via pursestring suture on the right  atrial appendage.  Cardiopulmonary bypass instituted and the patient was  cooled to 32 degrees Celsius.  The coronary arteries were inspected and  anastomotic sites were chosen.  The conduits were inspected and cut to  length.  A foam pad was placed in the pericardium to protect the left  phrenic nerve and insulate the heart.  The temperature probe was placed  in the myocardial septum and a cardioplegic cannula placed in the  ascending aorta.   The aorta was crossclamped.  The left ventricle was emptied via aortic  root vent.  Cardiac arrest then was achieved with a combination of cold  antegrade blood cardioplegia and topical iced saline.  After achieving a  complete diastolic arrest and adequate myocardial septal cooling with 1  liter of cardioplegia the septal temperature was 9 degrees Celsius.  The  following distal anastomoses were performed.   First a reverse saphenous vein graft was placed  sequentially to the  posterior descending and posterolateral branches of the right coronary.  The right coronary was totally occluded.  The posterior descending,  posterolateral both were good-quality targets.  The side-to-side  anastomosis to the posterior descending was performed with a running 7-0  Prolene suture.  This anastomosis and all others were probed proximally,  distally at their completion to ensure patency.  The vein was then cut  to length distally and the distal end was anastomosed to the posterior  lateral.  This was also 1.5 mm vessel.  There was excellent flow through  the graft.  Cardioplegia was administered.  There was good hemostasis of  the anastomoses.   Next a reverse saphenous vein graft was placed sequentially to the  first  and second obtuse marginal branches.  This vessel bifurcated and both  branches were relatively small but there was plaquing in both branches  at its origin.  Therefore the vein was anastomosed to each of the  bifurcated branches as sequential graft.  Side-to-side anastomosis was  performed to OM1 and end-to-side to OM2.  Both of these were slightly  less than 1 mm in diameter and were fair quality targets.  There was  good flow through the graft.  Cardioplegia was administered.  There was  good hemostasis.   Next the distal end of the radial artery was beveled and was anastomosed  end-to-side to the first diagonal.  This was a large dominant  anterolateral branch had a 70% proximal stenosis.  It was 2 mm diameter.  The radial was anastomosed end-to-side with a running 8-0 Prolene  suture.  At completion of anastomosis there was good flow through the  graft.  Cardioplegia was administered.  There was good hemostasis.   Next the left internal mammary artery was brought through a window in  the pericardium.  The distal end was beveled and was anastomosed end-to-  side to the distal LAD.  The LAD was a 2 mm vessel.  There was plaquing   throughout of the vessel but a probe did pass to the apex.  The mammary  was anastomosed end-to-side with running 8-0 Prolene suture.  At  completion of the mammary to LAD anastomosis, the bulldog clamp was  briefly removed to inspect for hemostasis.  Immediate rapid septal  rewarming was noted.  The bulldog clamp was replaced.  The mammary  pedicle was tacked to epicardial surface of heart with 6-0 Prolene  sutures.   Additional cardioplegia was administered down the grafts as well as the  aortic root.  The cardioplegia cannula was then removed from the  ascending aorta.  The proximal vein graft anastomoses were performed to  4.5 mm punch aortotomies with running 6-0 Prolene sutures.  At  completion of the final vein proximal anastomosis, the patient was  placed in Trendelenburg position.  Lidocaine was administered.  The  bulldog clamp was removed from the left mammary artery.  The aortic root  was de-aired and the aortic crossclamp was removed.  Total crossclamp  time was 79 minutes.  The patient required a single fibrillation with 20  joules.  He then remained in sinus rhythm thereafter.   Bulldog clamps were placed proximally and distally on the vein graft to  the obtuse marginals and end-to-side anastomosis for the proximal the  radial artery graft was performed to a venotomy in this vessel using a  running 7-0 Prolene suture.  The anastomosis was back bled to de-air it  prior to removing the proximal bulldog clamp.  While the patient was  being rewarmed, all proximal and distal anastomoses were inspect for  hemostasis.  Epicardial pacing wires were placed on the right ventricle,  right atrium.  When the patient had rewarmed to a core temperature of 37  degrees Celsius he was weaned from cardiopulmonary bypass without  difficulty.  The total bypass time was 133 minutes.  The patient weaned  on the first attempt without inotropic support.   Postbypass transesophageal  echocardiography revealed no change in the  mitral regurgitation which was mild and there was some improvement in  wall motion globally.   A test dose of protamine was administered and was well tolerated.  The  atrial and aortic cannulae were removed.  The remaining protamine was  administered without incident.  The chest was irrigated with 1 liter of  warm normal saline containing 1 gram of vancomycin.  Hemostasis was  achieved.  Left pleural and two mediastinal chest tubes were placed  through separate subcostal incisions.  The pericardium was  reapproximated with interrupted 3-0 silk sutures.  It came together  easily without tension.  The sternum was closed with combination of  single and double heavy gauge stainless steel wires.  The remaining  incisions closed in standard fashion.  Subcuticular closure used for the  skin.  All sponge, needle and instrument counts were correct at end of  the procedure.  The patient was taken from the operating room to the  surgical intensive care unit in fair condition.      Salvatore Decent Dorris Fetch, M.D.  Electronically Signed     SCH/MEDQ  D:  03/11/2007  T:  03/12/2007  Job:  [id number]   cc:   Cristy Hilts. Jacinto Halim, MD  Fleet Contras, M.D.  Balinda Quails, M.D.

## 2010-11-14 NOTE — Assessment & Plan Note (Signed)
OFFICE VISIT   William Pacheco, William Pacheco  DOB:  August 28, 1950                                       01/16/2007  ZDGLO#:[id number]   The patient presents today to discuss the results of his most recent  Doppler evaluation.  This does reveal drop off in his ankle brachial  indices bilaterally.  Right ABI measures 0.66, left ABI measures 0.48  compared to values of greater than 1.0 and 0.69 respectively in April of  this year.  Imaging reveals a patent right femoral-popliteal bypass  graft.  His left superficial femoral artery, however, reveals an area of  elevated velocity in the proximal and also distal vessel 330 cm per  second and 245 cm per second respectively.   These findings are most consistent with recurrent stenosis in the  atherectomized left superficial femoral artery.  I do think it would be  worthwhile to go ahead and get an arteriogram on this and potentially  intervene with angioplasty and stenting.  This will be scheduled for  01/24/2007 at [facility].   Balinda Quails, M.D.  Electronically Signed   PGH/MEDQ  D:  01/16/2007  T:  01/17/2007  Job:  [id number]

## 2010-11-14 NOTE — Procedures (Signed)
BYPASS GRAFT EVALUATION   INDICATION:  Followup bypass graft and stent placement.   HISTORY:  Diabetes:  Yes.  Cardiac:  Yes.  Hypertension:  Yes.  Smoking:  Previous.  Previous Surgery:  Right femoral to popliteal bypass graft, left  superficial femoral artery stent 01/24/2007.   SINGLE LEVEL ARTERIAL EXAM                               RIGHT              LEFT  Brachial:                    139                142  Anterior tibial:             135                129  Posterior tibial:            132                125  Peroneal:  Ankle/brachial index:        0.95               0.91   PREVIOUS ABI:  Date:  09/15/2009  RIGHT:  0.80  LEFT:  0.81   LOWER EXTREMITY BYPASS GRAFT DUPLEX EXAM:   DUPLEX:  Triphasic and biphasic Doppler waveforms throughout the right  bypass graft and native arteries, in the left lower extremity arteries.   IMPRESSION:  1. Patent right bypass graft.  2. Patent left superficial femoral artery stent with highest velocity      noted of 192 cm/s.  3. Bilateral ankle brachial indices have increased from previous      study.   ___________________________________________  [name], MD   EM/MEDQ  D:  05/12/2010  T:  05/12/2010  Job:  [id number]

## 2010-11-14 NOTE — Procedures (Signed)
BYPASS GRAFT EVALUATION   INDICATION:  Followup evaluation of right leg bypass graft and left leg  endarterectomy site.   HISTORY:  Diabetes:  Yes.  Cardiac:  No.  Hypertension:  No.  Smoking:  Quit in January of 2007.  Previous Surgery:  Right FEM-POP bypass graft with Gore-Tex on August 07, 2005.  Right femoral endarterectomy on September 16, 2006. Left  superficial femoral artery atherectomy in September of 2007.  All of the  preceding procedures were done by Dr. Madilyn Fireman.   SINGLE LEVEL ARTERIAL EXAM                               RIGHT              LEFT  Brachial:                    145.               141.  Anterior tibial:             95.                68.  Posterior tibial:            86.                70.  Peroneal:  Ankle/brachial index:        0.66.              0.48.   PREVIOUS ABI:  Date: October 10, 2006.  RIGHT:  1.0.  LEFT:  0.69.   LOWER EXTREMITY BYPASS GRAFT DUPLEX EXAM:   DUPLEX:  On the right, wave forms are biphasic proximal to within and  distal to the right FEM-POP bypass graft.  There are slightly elevated  peak systolic velocities seen in the distal native right popliteal  artery (145 cm per second).   On the left, wave forms are monophasic throughout the common femoral,  superficial femoral and popliteal arteries with elevated peak systolic  velocities of 330 cm per second in the proximal superficial femoral  artery and 245 cm per second in the distal superficial femoral artery.   IMPRESSION:  1. Ankle brachial indices are lower than previously recorded      bilaterally.  2. Patent right femoral-popliteal bypass graft.  3. Elevated peak systolic velocities throughout the left superficial      femoral artery at the endarterectomy sites.   ___________________________________________  P. Liliane Bade, M.D.   MC/MEDQ  D:  01/02/2007  T:  01/03/2007  Job:  [id number]

## 2010-11-14 NOTE — Consult Note (Signed)
NEW PATIENT CONSULTATION   [name], [name]  DOB:  [birth year]                                        February 20, 2007  CHART #:  [id number]   [name] is a 60 year old gentleman, sent for consultation by Dr.  Yates Decamp, regarding left main and three-vessel coronary disease.   HISTORY OF PRESENT ILLNESS:  [name] is a 60 year old gentleman who  presents with a chief complaint of sudden onset of shortness of breath  at night.  [name] has a past medical history significant for  peripheral vascular disease, hypertension, dyslipidemia, remote tobacco  abuse.  Over the past 6-8 months, he has had about a half dozen episodes  where he would awaken suddenly in the night.  He would be very short of  breath and would have to sit up to catch his breath.  He would be  breathing in a congested fashion according to his wife with some  wheezing and coughing.  He denies any chest pain.  He also denies any  exertional chest pain or shortness of breath, but his exertion primarily  is limited by peripheral claudication.  He has multiple peripheral  vascular interventions by Dr. Madilyn Fireman dating back about 18 months where he  has had a fem-popliteal bypass on the right, a right common femoral  endarterectomy, and he has had some angioplasty done on the left side.  [name] denies any previous history of cardiac disease.   Given these symptoms, he saw Dr. Sherilyn Banker and had an echocardiogram  which showed an ejection fraction of 35-40% with moderate mitral  regurgitation.  He had a nondiagnostic stress test but given his  history, was advised to undergo cardiac catheterization.  He had right  and left heart catheterization where he was found to have normal right-  sided pressures with a good cardiac index.  His ejection fraction was 40-  45% with only mild mitral regurgitation.  His right coronary was totally  occluded.  His left main had an ostial 50-60% stenosis.  He  had  significant disease in his proximal LAD and diagonal.   PAST MEDICAL HISTORY:  1. Peripheral vascular disease with previous right femoral popliteal      bypass grafting with a Gore-Tex graft, right femoral endarterectomy      and patch angioplasty and left percutaneous angioplasty with      stenting.  2. Claudication.  3. Hypertension.  4. Hypercholesterolemia.  5. Type 2 adult onset insulin-dependent diabetes.  6. Remote tobacco abuse.   CURRENT MEDICATIONS:  1. Avandamet 09/998 mg tabs twice daily.  2. Simvastatin 80 mg daily.  3. Lantus insulin 45 units subcu daily.  4. Aspirin 325 mg daily.  He had previously been on Plavix, but that      has been discontinued.  5. Diovan 160 mg daily.  6. Coreg 6.25 mg b.i.d.   He has no known drug allergies.   FAMILY HISTORY:  Uncertain because he is adopted.   SOCIAL HISTORY:  He works as a Arboriculturist at [employer].  He  lives with his wife.  He smoked a pack a day for 25 years.  He quit 2  years ago.  He is physically active but not on a regular exercise  program.  He does not drink alcohol.   REVIEW OF  SYSTEMS:  Significant for claudication and muscle pain and  also has sensitive eyes bothered by bright light.   All other systems are negative.   PHYSICAL EXAMINATION:  [name] is a 60 year old African-American  male in no acute distress.  He is overweight but otherwise well-  developed.  His blood pressure is 106/67, pulse 92, respirations 18.  His oxygen  saturation is 97% on room air.  NEUROLOGIC:  He is alert and oriented x3, appropriate, and grossly  intact with no focal motor deficits.  HEENT:  Unremarkable.  NECK:  Supple without thyromegaly, adenopathy, or bruits.  CARDIAC:  Regular rate and rhythm, normal S1 and S2.  I do not hear a  murmur.  There is no rub or gallop.  LUNGS:  Clear.  ABDOMEN:  Soft and nontender.  EXTREMITIES:  Without cyanosis, clubbing, or edema.  He does have normal  Allen's  test bilaterally but is somewhat sluggish to refill.  He has  multiple surgical incisions on his right leg which are well-healed.  There is no peripheral edema.  He does have palpable pulses on the  right, diminished pulses on the left.   LABORATORY DATA:  Cardiac catheterization is described.  Electrocardiogram is described.  Carotid Dopplers showed no significant  stenoses.  His ABIs were 1.15 on the right and 0.77 on the left.  Palmar  arch evaluation with Doppler waveform was normal bilaterally with radial  compression.   IMPRESSION:  [name] is a 60 year old gentleman with multiple  cardiac risk factors and peripheral vascular disease who has been having  paroxysmal nocturnal dyspnea over the past 6-8 months.  At  catheterization, he has left main and three-vessel coronary disease,  coronary artery bypass grafting as indicated for survival benefit and  hopefully also improved his symptoms.  The difficulty in his case will  be conduits.  Given his diabetes, he is not a candidate for bilateral  mammary arteries.  We will plan to use the left internal mammary artery  to his left anterior descending.  He has had the greater saphenous vein  harvested from his right leg, and that was unusable for a femoral  popliteal bypass.  We will need to harvest the vein from his left leg,  but we will have to wait and see how suitable that will be to use as a  bypass graft.  Given his young age, we will plan to do a left radial  artery harvest.  His Allen's test is normal, although the refill is a  little more sluggish that I would ideally like to see with a normal  Doppler waveform.  I do not think we can afford not use his radial  artery.  I also did advise them that there was a potential we might need  to use his right radial artery as well.   He will need a graft to his left anterior descending, diagonal, his high  first obtuse marginal which is essentially a ramus equivalent as well as   a posterior descending and possibly posterolateral arteries on the  right.  I discussed in detail with he and his wife the nature of the  operation, the incision to be used, potential complications.  They  understand the indications, risks, benefits, and alternative treatments.  They understand the risks include but are not limited to death, stroke,  deep venous thrombosis, pulmonary embolism, bleeding, possible need for  transfusions, infections, as well as other organ system dysfunction  including respiratory, renal,  or gastrointestinal complications as well  as the risk of hand ischemia due to radial artery harvest or leg  ischemic complications or wound healing complications due to his poor  circulation in his legs.  He understands and accepts these risks and  agrees to proceed.  We will plan to evaluate him with a transesophageal  echocardiogram intraoperatively to see if he also needs a mitral valve  annuloplasty.  He is going to have a TEE done tomorrow, so we should  have a much better idea  going into surgery whether or not that will be necessary, but we will  plan to do the TEE intraoperatively in any event.   Salvatore Decent Dorris Fetch, M.D.  Electronically Signed   SCH/MEDQ  D:  02/20/2007  T:  02/21/2007  Job:  [id number]   cc:   Cristy Hilts. Jacinto Halim, MD  Fleet Contras, M.D.  Balinda Quails, M.D.

## 2010-11-14 NOTE — Letter (Signed)
February 20, 2007   Cristy Hilts. Jacinto Halim, MD  [address]  [place]  [place] [address]   Re:  William Pacheco, William Pacheco               DOB:  09/03/1950   Dear Vonna Kotyk:   Thank you very much for sending Lorcan Shelp for evaluation.  I saw him  in the office today.  He is a very nice 60 year old gentleman from [place] originally who has severe peripheral vascular disease,  multiple cardiac risk factors and now has been found to have severe  coronary artery disease with left main disease in the setting with  totally occluded right coronary.  I agree with you that Mr. Twedt  needs coronary artery bypass grafting and advised him of that.  He also  has an echocardiogram which showed moderate mitral regurgitation  although not much showed up on catheterization and he is scheduled to  have a TE to further evaluate that in the near future.  I encouraged Mr.  Cardosa to have surgery as soon as possible. At his request, we are  going to schedule it for March 11, 2007 but I asked him to contact  either you or me immediately if you he were to have any worsening of his  symptoms so that we could proceed at a sooner time.  I would really like  to do him right after Labor Day but he strongly desires to wait until  the 9th.   Again, thank you for allowing me to see Mr. Molyneux.  I look forward to  participating in his care and please do not hesitate to contact me if  there is anything I can do to assist you in the meantime.   Sincerely,   Salvatore Decent. Dorris Fetch, M.D.  Electronically Signed   SCH/MEDQ  D:  02/20/2007  T:  02/21/2007  Job:  [id number]   cc:   Balinda Quails, M.D.  Cristy Hilts. Jacinto Halim, MD  Fleet Contras, M.D.

## 2010-11-14 NOTE — Assessment & Plan Note (Signed)
OFFICE VISIT   William Pacheco, William Pacheco  DOB:  1951-06-03                                       12/04/2007  EAVWU#:[id number]   The patient is complaining of recurrent bilateral claudication.  He  underwent lower extremity Dopplers today and these do reveal evidence of  quite marked elevation in velocities in the common femoral arteries  bilaterally.  He has had a previous right common femoral and external  iliac endarterectomy.  He has had stent placement in the left  superficial femoral artery.  He has a right femoral popliteal bypass.   Surgical management of his right common femoral disease may be  potentially very difficult due to his multiple previous operations.  He  has agreed to undergo an arteriogram with the potential for possible  endovascular intervention.  This will be carried out on 12/26/2007 at  [facility].   Balinda Quails, M.D.  Electronically Signed   PGH/MEDQ  D:  12/04/2007  T:  12/05/2007  Job:  [id number]   cc:   Cristy Hilts. Jacinto Halim, MD

## 2010-11-14 NOTE — Assessment & Plan Note (Signed)
OFFICE VISIT   DOMENIQUE, SOUTHERS  DOB:  [birth year]                                       01/22/2008  WGNFA#:[id number]   The patient returned today to discuss the results of his arteriogram.  This reveals no evidence of significant aortoiliac occlusive disease.  He does have bilateral common femoral artery plaque with significant  stenoses bilaterally.  He has a patent right femoral popliteal bypass.  Patent left SFA stent.   His claudication symptoms are clearly related to his bilateral common  femoral artery disease.  These can be treated quite appropriately with  bilateral femoral endarterectomy and patch angioplasty.  This is what I  have recommended and he seems agreeable.  He is not able to schedule at  this time, however, due to some work issues.  He is going to contact the  office when he would like to go ahead.   Balinda Quails, M.D.  Electronically Signed   PGH/MEDQ  D:  01/22/2008  T:  01/23/2008  Job:  [id number]   cc:   Cristy Hilts. Jacinto Halim, MD  Fleet Contras, M.D.

## 2010-11-14 NOTE — Op Note (Signed)
NAME:  William Pacheco, William Pacheco               ACCOUNT NO.:  [id number]   MEDICAL RECORD NO.:  [id number]          PATIENT TYPE:  AMB   LOCATION:  SDS                          FACILITY:  [facility]   PHYSICIAN:  Balinda Quails, M.D.    DATE OF BIRTH:  [birth year]   DATE OF PROCEDURE:  01/24/2007  DATE OF DISCHARGE:  01/24/2007                               OPERATIVE REPORT   SURGEON:  Balinda Quails, M.D.   DISCHARGE DIAGNOSIS:  Recurrent left lower extremity claudication.   PROCEDURE:  1. Left lower extremity arteriogram.  2. Left superficial femoral artery percutaneous transluminal      angioplasty and stent.  3. Retrograde right femoral arteriogram.   ACCESS:  Right common femoral artery 6-French sheath.   CONTRAST:  185 mL Visipaque.   COMPLICATIONS:  None apparent.   CLINICAL NOTE:  [name] is a 60 year old diabetic male with history  of peripheral vascular disease and previous right femoral popliteal  bypass.  He has had atherectomy of his left superficial femoral artery.  He initially had a good response to his atherectomy of the left lower  extremity, however, developed recurrent claudication.  Workup for this  revealed reduction of his left ankle brachial index and duplex scan  verified evidence of multiple areas of stenosis in his left superficial  femoral artery.  He is brought to the cath lab at this time for left  lower extremity arteriography and possible percutaneous intervention.   PROCEDURE NOTE:  The patient was brought to the cath lab in stable  condition.  He was placed in the supine position.  Both groins were  prepped and draped in a sterile fashion.  He received 50 mcg fentanyl  and 1 mg of Versed intravenously.  The skin and subcutaneous tissue of  the right groin was instilled with 1% Xylocaine.   An 18 gauge needle was introduced into the right common femoral artery.  A 0.035 Wholey guidewire was advanced through the needle into the  abdominal aorta under  fluoroscopy.  The site was opened with an 11  blade.  A 6 French dilator was advanced over the guidewire.  A 6 French  sheath was then advanced over the guidewire.  The guidewire removed.  The dilator removed.  A 0.035 Wholey guidewire was advanced through the  sheath into the mid abdominal aorta.  A crossover catheter was then  advanced over the guidewire.  This was engaged in the left common iliac  artery.  The Wholey guidewire was then advanced down into the proximal  left superficial femoral artery.  The crossover catheter was advanced  over the guidewire into the left common femoral artery.  The Amplatz  superstiff guidewire was then advanced through the crossover catheter  into the left common femoral artery.  The crossover catheter was  removed.  The right femoral sheath was removed.  A 6 French Terumo  sheath advanced over guidewire into the left external iliac artery.  A  left lower extremity arteriogram was then obtained through the sheath.  This revealed moderate plaque disease in the left common femoral artery  without significant stenosis.  The left profunda femoris artery was  patent.  Further views of the left superficial femoral artery then  revealed distinct areas of restenosis.  There was a tandem area in the  proximal left thigh, an area of mild stenosis in the mid thigh, in the  distal thigh at the adductor canal, there was an area of high grade  stenosis greater than 80%.  The runoff views revealed that there was  disease in the popliteal artery; however, this was diffuse and moderate.   Attention was then placed on the most distal lesion in the left  superficial femoral artery.  There was an area of calcification  associated with this.  This was initially predilated with a 4 x 40  Powerflex balloon at 14 atmospheres x 60 seconds.  The balloon was then  removed and a 7 x 40 Powerflex self expanding stent was placed at the  area of the lesion.  This was then post  dilated with a 5 x 4 Powerflex  balloon at 10 atmospheres x 50 seconds.  Completion arteriogram revealed  good technical results.   Attention was then placed on the more proximal lesions.  There was an  area of moderate stenosis noted in the mid left superficial femoral  artery.  This was dilated with a 4 x 40 Powerflex balloon at 10  atmospheres x 60 seconds.  Following balloon angioplasty of the mid SFA  lesion, there was a good result without any evidence of dissection.  Attention was then placed on the more proximal tandem lesions.  These  were predilated with a 4 x 40 Powerflex balloon at 10 atmospheres x 60  seconds.  Following predilatation, this area was then stented with a 6 x  60 Everflex stent.  Completion arteriography revealed an excellent  result and post dilatation was not carried out.  There was an area of  mild tubular stenosis in the more proximal portion of the left  superficial femoral artery, however, this was not intervened on.   Following completion of interventions in the left lower extremity, a  runoff arteriogram was obtained.  This revealed an intact runoff with  moderate disease in the left popliteal artery, aberrant takeoff in the  left anterior tibial artery.  Dominant runoff with the left anterior and  posterior tibial arteries.  No evidence of distal embolization.   The guidewire was then removed and the sheath drawn back into the right  external iliac artery.  A retrograde right femoral arteriogram was  obtained.  This revealed a patent right external iliac artery.  The  right common femoral artery was patent.  The right profunda femoris  artery revealed mild disease at its origin.  The right superficial  femoral artery was occluded just beyond its origin.  The right femoral  popliteal Gore-Tex graft was patent.  This was anastomosed to the below  knee popliteal artery.  There was calcified plaque in the below knee  popliteal artery with long irregular  stenosis.  The anterior tibial  artery occluded at its origin.  Tibioperoneal trunk disease patent,  intact peroneal and posterior tibial runoff.  Reconstitution of the  anterior tibial artery in the mid calf.   This completed the procedure.  The patient did receive 5000 units  heparin before the interventions were undertaken.  The ACT measured 175  seconds.  There were no apparent complications.  The patient was  transferred to the holding area in stable condition.   FINAL  IMPRESSION:  1. Recurrent left lower extremity claudication associated with      recurrent left superficial femoral artery stenosis.  2. Technically successful left superficial femoral artery angioplasty      and stenting.  3. Patent right femoral popliteal bypass with diseased right lower      extremity tibial artery runoff via posterior tibial and peroneal.      Balinda Quails, M.D.  Electronically Signed    PGH/MEDQ  D:  01/24/2007  T:  01/25/2007  Job:  [id number]   cc:   Fleet Contras, M.D.  Cristy Hilts. Jacinto Halim, MD

## 2010-11-14 NOTE — Assessment & Plan Note (Signed)
OFFICE VISIT   William Pacheco, William Pacheco  DOB:  [birth year]                                       02/27/2007  ZOXWR#:[id number]   William Pacheco returned to the office today, he has most recently  undergone left lower extremity arteriogram with left superficial femoral  artery PTA with stenting. This was carried out 01/24/2007. It was a  technically successful procedure. He had tandem areas of restenosis in  his left superficial femoral artery following atherectomy.   His blood pressure is 170/86, pulse 87 per minute, regular respirations  18 per minute. Lower extremity evaluation reveals both feet to be well  perfused. There are no pedal pulses palpable. 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   William Pacheco is due to undergo coronary artery bypass by Dr. Dorris Fetch  on September 9. I have encouraged that he follow up with cardiac rehab  following this which should help with his claudication symptoms.   Balinda Quails, M.D.  Electronically Signed   PGH/MEDQ  D:  02/27/2007  T:  03/01/2007  Job:  [id number]

## 2010-11-14 NOTE — Procedures (Signed)
BYPASS GRAFT EVALUATION   INDICATION:  Bilateral lower extremity vascular interventions.   HISTORY:  Diabetes:  Yes.  Cardiac:  CABG.  Hypertension:  Yes.  Smoking:  Previous.  Previous Surgery:  Right fem-pop bypass graft on 08/07/2005, right  femoral artery endarterectomy on 09/16/2006, left superficial femoral  artery endarterectomy in September of 2007, left superficial femoral  artery PTA and stent on 01/24/2007, left external iliac and common  femoral artery endarterectomies on 03/15/2008.   SINGLE LEVEL ARTERIAL EXAM                               RIGHT              LEFT  Brachial:                    129                147  Anterior tibial:             88                 112  Posterior tibial:            104                64  Peroneal:  Ankle/brachial index:        0.71               0.76   PREVIOUS ABI:  Date:  09/29/2008  RIGHT:  0.77  LEFT:  0.81   LOWER EXTREMITY BYPASS GRAFT DUPLEX EXAM:   DUPLEX:  Monophasic Doppler waveforms noted throughout the right lower  extremity bypass graft and its native vessels with no focal increase in  velocities noted.  Monophasic / biphasic Doppler waveforms noted throughout the left common  femoral, superficial femoral and popliteal arteries with mild increased  velocities of 204 cm/s and 214 cm/s noted at the common femoral and  proximal to mid superficial femoral artery levels respectively.   IMPRESSION:  1. Patent right femoral-popliteal bypass graft and left superficial      femoral artery stents with mild increased velocity noted, as      described above.  2. Stable bilateral ankle brachial indices.    _________________________________________  Di Kindle. Edilia Bo, M.D.   CH/MEDQ  D:  03/25/2009  T:  03/25/2009  Job:  [id number]

## 2010-11-14 NOTE — Discharge Summary (Signed)
NAME:  William Pacheco, William Pacheco NO.:  [id number]   MEDICAL RECORD NO.:  [id number]          PATIENT TYPE:  INP   LOCATION:  2016                         FACILITY:  [facility]   PHYSICIAN:  Salvatore Decent. Dorris Fetch, M.D.DATE OF BIRTH:  Sep 04, 1950   DATE OF ADMISSION:  03/11/2007  DATE OF DISCHARGE:                               DISCHARGE SUMMARY   DISCHARGE:  Pending   HISTORY OF PRESENT ILLNESS:  The patient is a 60 year old male with a  chief complaint of sudden onset of shortness of breath at night.  He was  referred to Dr. Charlett Lango for surgical opinion following full  cardiology evaluation which revealed severe multivessel and left main  coronary artery disease.  Mr. Soltau has a past medical history  significant for peripheral vascular disease status post multiple  bilateral procedures of his lower extremities.  He also has  hypertension, dyslipidemia, and remote tobacco abuse.  Over the past 6-8  months he has had approximately a half a dozen episodes where he would  awaken suddenly in the night.  He would be very short of breath and half  to sit up and catch his breath.  He would be breathing and a congested  fashion, according to his wife, with some wheezing and coughing.  He  denied chest pain.  He also denied any exertional chest pain or  shortness of breath, but his exertion is limited to peripheral  claudication.  Given these symptoms, he was evaluated by Dr. Jacinto Halim and  an echocardiogram revealed an ejection fraction of 35-40% with a  moderate mitral regurgitation.  He had a nondiagnostic stress test, but  given his history, he was advised to undergo cardiac catheterization  which was done; and this revealed an ejection fraction of 40-45% with  only mild mitral regurgitation.  His right coronary artery was totally  occluded.  His left main had an ostial 58-60% stenosis.  He had  significant disease in the proximal LAD and diagonal as well.  Due to  these  findings, he was recommended surgical revascularization.  He was  admitted this hospitalization for the procedure.   PAST MEDICAL HISTORY:  1. Peripheral vascular disease with previous right femoral popliteal      bypass with a Gore-Tex graft.  Right femoral endarterectomy and      patch angioplasty and left percutaneous angioplasty with stenting.  2. Claudication.  3. Hypertension.  4. Hypercholesterolemia.  5. Type 2 diabetes mellitus, insulin dependent diabetes.  6. Remote tobacco use.   MEDICATIONS PRIOR TO ADMISSION INCLUDE:  1. Avandamet 09/998 one tablet twice daily.  2. Simvastatin 40 mg daily.  3. Lantus insulin 45 units daily.  4. Aspirin 325 mg daily.  5. Diovan 160 mg daily.  6. Coreg at 6.25 mg b.i.d.   ALLERGIES:  No known drug allergies.   For family history, social history, review of systems and physical exam  -- please see the history and physical done at the time of admission.   HOSPITAL COURSE:  The patient was admitted electively and on March 11, 2007 he was taken to the  operating room at which time he underwent  the following procedure:  Coronary artery bypass grafting x6.  The  following grafts were placed:  Left internal mammary artery to left  anterior descending, left radial artery to first diagonal, saphenous  vein graft to the obtuse marginals 1 and 2, saphenous vein graft to  posterior descending and posterior lateral.  Endoscopic vein harvest of  left leg open left radial artery harvest.  He tolerated this procedure  and was taken to the surgical intensive care unit in stable condition.   POSTOPERATIVE HOSPITAL COURSE:  The patient has done well overall.  He  has maintained stable hemodynamics.  He was weaned from the ventilator  without significant difficulty.  He was placed on Imdur for his radial  artery graft so as to lower his risk of spasm.  He does have acute  postoperative blood loss anemia, but this has stabilized.  His most  recent  hemoglobin/hematocrit dated March 13, 2007 are 9.1 and 26.80  respectively.  Electrolytes, BUN, and creatinine are within normal  limits.  Most recent BUN and creatinine dated March 13, 2007 are 21  and 0.96.  He is neurologically intact although there is some decreased  sensation in the left hand, the side where his radial artery was  removed.  All routine lines, monitors and drainage devices have been  discontinued in the standard fashion.  He is tolerating gradual increase  in activity using standard protocols.  His incisions are healing well  without evidence of infection.   He has had some sinus tachycardia but no other dysrhythmias.  His beta  blocker has been adjusted for this.  His volume status has shown steady  improvement, but he still had some moderate volume overload and will  require further diuresis as an outpatient.  His blood sugars have been  under adequate control; and he is to be started on his oral home regimen  in addition to his insulin.  Overall his status is felt to be  tentatively stable for discharge in the next day or so.   MEDICATIONS AT TIME OF DISCHARGE:  1. Aspirin 325 mg daily.  2. Coreg at 12.5 mg twice daily  3. Altace 2.5 mg daily.  4. Zocor 80 mg daily.  5. Oxycodone 5 mg one to two q.4-6 h. as needed for pain.  6. Avandamet 09/998 one tablet twice daily.  7. Imdur 30 mg daily for 1 month.  8. Amaryl 4 mg daily.  9. Lantus insulin 45 units q.p.m.  10.Lasix 40 mg daily for 7 days.  11.K-Dur 20 mEq daily for 7 days.   INSTRUCTIONS:  The patient received written instructions in regard to  medications, activity, diet, wound care, and followup.  He is to follow  up with Dr. Dorris Fetch on April 04, 2007 at 11:30 a.m. with a chest x-  ray at that time.  Additionally he has been instructed to call Dr.  Verl Dicker office to obtain an appointment in 2 weeks.   FINAL DIAGNOSES INCLUDE:  Severe coronary artery disease as described  above, now  status post surgical revascularization.   OTHER DIAGNOSES INCLUDE:  1. Acute blood loss anemia.  2. Severe peripheral vascular occlusive disease  3. Hypertension.  4. Hypercholesterolemia.  5. Type 2 diabetes mellitus insulin dependent.  6. Remote tobacco abuse.  7. Ejection fraction 40% on cardiac catheterization.  8. Mild mitral regurgitation by cardiac catheterization.      Rowe Clack, P.A.-C.  Salvatore Decent Dorris Fetch, M.D.  Electronically Signed    WEG/MEDQ  D:  03/14/2007  T:  03/15/2007  Job:  [id number]   cc:   Salvatore Decent. Dorris Fetch, M.D.  Cristy Hilts. Jacinto Halim, MD  P. Liliane Bade, M.D.  Fleet Contras, M.D.

## 2010-11-14 NOTE — Cardiovascular Report (Signed)
NAME:  William Pacheco, PTACEK NO.:  [id number]   MEDICAL RECORD NO.:  [id number]          PATIENT TYPE:  OIB   LOCATION:  2899                         FACILITY:  [facility]   PHYSICIAN:  Cristy Hilts. Jacinto Halim, MD       DATE OF BIRTH:  11/30/1950   DATE OF PROCEDURE:  02/14/2007  DATE OF DISCHARGE:  02/14/2007                            CARDIAC CATHETERIZATION   DATE OF PROCEDURE:  February 14, 2007.   PROCEDURE PERFORMED:  1. Left ventriculography.  2. Selective right and left coronary aortography.  3. Distal abdominal aortogram.  4. Right heart catheterization.   INDICATIONS:  William Pacheco is a 60 year old gentleman who has got  hypertension, diabetes, hyperlipidemia, who has been complaining of  shortness of breath.  He had undergone a stress test.  He had undergone  a echocardiographic examination, and this had revealed an ejection  fraction of 35% to 45%, with moderate mitral regurgitation.  He has also  been having symptoms of paroxysmal nocturnal dyspnea.  Given the fact  that he has peripheral arterial disease and high-risk features and  suspicion for multivessel disease, he was brought to the cardiac  catheterization lab to evaluate his coronary anatomy.  Right heart  catheterization is being performed to evaluate the significance of  mitral regurgitation and to evaluate pulmonary hypertension.  Distal  abdominal aortogram was performed, because of difficulty in access and  inability to feel left femoral arterial pulse.  The patient also has  history of right femoral-popliteal bypass surgery in 1997.   RIGHT HEART CATHETERIZATION:  Hemodynamic data:   RA pressure 8/5 with a mean of 4 mmHg.   RV pressure 28/4 with end-diastolic pressure of 6 mmHg.   PA pressure 25/10 with a mean of 17 mmHg.   Pulmonary capillary wedge 17/12 with a mean of 10 mmHg.   The V-wave measured 17 mm, A-wave measured 10 mmHg.   Cardiac output 4.60, cardiac index 2.03 by Fick.  PA  saturation 58%,  aortic saturation 93%.   HEMODYNAMIC DATA:  Left heart catheterization.  The ventricular pressure  was 121/6, with end-diastolic pressure of 11 mmHg.  Aortic pressure  124/67 with a mean of 91 mmHg.  No pressure gradient across the aortic  valve.   ANGIOGRAPHIC DATA:  Left ventricle.  Left ventricular systolic function  was 40% to 45% with severe inferior wall hypokinesis.  There was utmost  mild mitral regurgitation.   Right coronary artery.  Right coronary artery is a large caliber vessel,  which is occluded in its proximal segment.  Distal right coronary artery  is supplied by contralateral and ipsilateral collaterals.  It gives  origin to large PDA and small-to-moderate sized PLA.   Left main.  Left main is an ostial 50% to 60% stenosis.   LAD.  LAD is a large caliber vessel, gives origin to small diagonal 1, a  moderate-to-large sized diagonal 2.  The diagonal 2 has a 70% stenosis.  The mid-LAD after the origin of diagonal 2 has a 80% stenosis, which is  focal.  The target of the distal LAD is  a good target.  It wraps around  the apex.   Circumflex coronary artery.  Circumflex artery is a moderate caliber  vessel.  It gives origin to a high obtuse marginal 1, and this has mild  disease.  His distal circumflex gives origin to a small obtuse marginal  2, which is occluded in its proximal segment.   Distal abdominal aortogram.  Distal abdominal aortogram revealed the  right iliac artery and part of the common femoral artery to be widely  patent.  The left common iliac artery and external iliac artery was  widely patent.  However, the left femoral artery showed a 80% to 90%  stenosis.   Right innominate, right subclavian and LIMA were widely patent.   Left subclavian and LIMA.  Left subclavian and LIMA were widely patent.   IMPRESSION:  1. Significant left main disease constituting 50% to 60% stenosis.      LAD has significant disease in the mid-segment,  and also RCA is      occluded with ipsilateral and contralateral collaterals.  2. Mild decrease in LV systolic function, ejection fraction 40% to      45%.  The right heart catheterization hemodynamics suggest      probable, at least moderate, mitral regurgitation with presence of      a prominent V-wave; however, angiography did not to confirm      significant mitral regurgitation.  3. Significant peripheral arterial disease with high-grade stenosis of      the left common femoral artery.   RECOMMENDATIONS:  1. The patient will need of coronary artery bypass grafting.  I      discussed these findings with Dr. Andrey Spearman, and he will be      seeing the patient in the outpatient setting.  2. He probably will need a TEE, to better evaluate the mitral valve      anatomy prior to his bypass surgery.  I will try to set this up in      the next week.  3. Continued optimal optimization of medical care is indicated.   TECHNIQUE OF THE PROCEDURE:  Usual sterile precautions using a 6-French  left femoral arterial and a 7-French left femoral venous access, left  and right heart catheterization was performed.   Right heart catheterization was performed using a balloon-tip Swan-Ganz  catheter, which was easily advanced into the pulmonary capillary wedge  pressure, and right-sided hemodynamics were carefully measured, and the  waveforms were carefully analyzed.  The catheter was then pulled out of  body.   Left heart catheterization was performed using a 6-French multipurpose B  #2  catheter, which was advanced to the ascending aorta and continued  into the left ventricle over a J-wire.  Left ventricular hemodynamics  were carefully analyzed, and then the catheter was pulled into the  aortic root, and right coronary artery selectively engaged angiography  was performed, and then the left main coronary artery was selectively  engaged and angiography was performed.  The catheter was  utilized to  engage the right subclavian and right innominate and left subclavian  artery and the LIMA was visualized, and then the catheter was pulled  back in the distal abdominal aorta, and distal abdominal aortogram was  performed.  The catheter was then pulled out of body.  An angled pigtail  catheter was introduced back into the left ventricle over a J-wire.  This was a 6-French catheter, and left ventriculography was performed in  the RAO projection.  The catheter was then pulled out of body over a J-  wire.  The patient tolerated the procedure.  No immediate complications  were noted.      Cristy Hilts. Jacinto Halim, MD  Electronically Signed     JRG/MEDQ  D:  02/14/2007  T:  02/14/2007  Job:  [id number]   cc:   Fleet Contras, M.D.  Balinda Quails, M.D.  Salvatore Decent Dorris Fetch, M.D.

## 2010-11-17 NOTE — Op Note (Signed)
NAME:  William Pacheco, William Pacheco               ACCOUNT NO.:  [id number]   MEDICAL RECORD NO.:  [id number]          PATIENT TYPE:  AMB   LOCATION:  SDS                          FACILITY:  [facility]   PHYSICIAN:  Balinda Quails, M.D.    DATE OF BIRTH:  Apr 08, 1951   DATE OF PROCEDURE:  03/15/2006  DATE OF DISCHARGE:  03/15/2006                                 OPERATIVE REPORT   DIAGNOSIS:  Left lower extremity claudication.   PROCEDURE:  1. Left lower extremity arteriogram.  2. Left superficial femoral artery atherectomy with LS Fox [place]      atherectomy device.   ACCESS:  Right common femoral artery 7 French sheath.   CONTRAST:  125 mL Visipaque.   COMPLICATIONS:  None apparent.   CLINICAL NOTE:  William Pacheco is a 60 year old diabetic male with a history  of peripheral vascular disease.  Previously has undergone right femoral-  popliteal bypass.  Has also previously undergone left superficial femoral  artery atherectomy.  He presented with recurrent claudication symptoms  bilaterally.  Diagnostic arteriography revealed disease in the right common  femoral artery proximal to his bypass, and recurrent left superficial  femoral artery disease.  There are 3 distinct areas of re-stenosis in the  left superficial femoral artery.  He is brought back to the cath lab at this  time for left superficial femoral atherectomy.   PROCEDURE NOTE:  Patient was brought to the cath lab in stable condition.  Placed in supine position.  Received 50 mcg of fentanyl, 2 mg of Versed  intravenously.  Skin and subcutaneous tissue in the right groin instilled  with 1% Xylocaine.  A needle was used to introduce the right common femoral  artery.  Then 0.035 Amplatz superstiff guidewire advanced through the needle  into the abdominal aorta.  The needle removed, and a short 6 French sheath  advanced over the guidewire.  The dilator removed, and the sheath flushed  with heparin and saline solution.  A crossover catheter  was then advanced  over the guidewire.  Engaged in the left common iliac artery.  A 0.035  angled glide wire was then advanced through the catheter down into the left  common femoral artery.  The crossover catheter removed, and end-hold  catheter advanced down the left common femoral artery.  The glide wire was  removed.  The Amplatz superstiff guidewire was then advanced down into the  left common femoral artery.  The end-hold catheter and right femoral sheath  were then removed.  A long 7 French Terumo sheath was then advanced over the  guidewire into the left external iliac artery.  Dilator and guidewire were  removed.  A 0.014 Whisper guidewire was then advanced through the sheath and  down into the left superficial femoral artery and then down the left  popliteal artery.  Left lower extremity arteriogram was then obtained.  This  revealed 3 distinct areas of stenosis in the left superficial femoral  artery.   Patient was administered 7000 units heparin intravenously.  ACT measured 270  seconds.   The LS [place] atherectomy device  was then advanced over the guidewire.  This was advanced down with the assistance of the [place] tape.  The  distal-most area of focal stenosis at the adductor canal was then treated  with the atherectomy device for a total of 11 passes.  The device was  withdrawn as needed for removal of the plaque material.   The mid SFA lesion was then similarly treated with a total of 12 passes of  the atherectomy device, cleaning as needed.   The most proximal lesion was then treated with a total of 8 passes of the  atherectomy device.   At completion, an arteriogram was obtained.  This revealed a widely patent  left superficial femoral artery with minimal residual stenosis.  The tibial  vessels were all widely patent without evidence of distal embolization.   The guidewire was then removed.  The sheath was drawn back into the right  common iliac artery.   The Amplatz superstiff guidewire was advanced back  into the abdominal aorta.  The long sheath was removed and the short 7  French sheath was advanced over the guidewire.  The dilator was removed and  the sheath was flushed with heparin and saline solution.  At termination of  procedure, ACT measured 214 seconds.   FINAL IMPRESSION:  1. Recurrent stenosis, left superficial femoral artery.  2. Successful atherectomy, left superficial femoral artery with Fox [place]      device.  3. No evidence of distal embolization or other complications.   DISPOSITION:  Patient will be followed up in the office in approximately 6  weeks.  He will be given a prescription for Plavix 75 mg daily.      Balinda Quails, M.D.  Electronically Signed     PGH/MEDQ  D:  03/15/2006  T:  03/15/2006  Job:  [id number]

## 2010-11-17 NOTE — H&P (Signed)
NAME:  CORBETT, MOULDER NO.:  [id number]   MEDICAL RECORD NO.:  [id number]          PATIENT TYPE:  INP   LOCATION:  NA                           FACILITY:  [facility]   PHYSICIAN:  Balinda Quails, M.D.    DATE OF BIRTH:  05-30-1951   DATE OF ADMISSION:  08/23/2006  DATE OF DISCHARGE:                              HISTORY & PHYSICAL   CHIEF COMPLAINT:  Right leg pain.   HISTORY AND PHYSICAL:  This is a 60 year old African American male with  a history of peripheral vascular occlusive disease, status post multiple  procedures.  Patient recently underwent a right fem-pop bypass graft on  August 07, 2005 without any complications and a left superficial  femoral arthrectomy September of 2007.  Unfortunately, the patient  currently experiences bilateral lower extremity most severe in his right  lower extremity.  Patient complains of tightness and cramping pain after  walking a few blocks.  Pain is present in his bilateral thighs and  bilateral calves.  Pain is greater in the right than in the left.  He  denies any rest pain.  The patient denies any ulcerations, fevers,  chills, decrease in his temperature in the lower extremities, chest  pain, shortness of breath or TIAs/CVAs symptoms.  He does have a  decrease in motor function and can only walk a few blocks before pain  setting in.   The patient underwent ABIs, which were 0.66 in the right leg with a  patent right fem-pop bypass graft with evidence of disease in his right  external iliac artery and common femoral artery proximal to the bypass  graft.  The left ABI is decreased to 0.68 from postintervention value of  0.85.   Due to the patient being limited by his claudication of the right lower  extremity, associated with right external iliac and femoral disease, it  was best felt the patient undergo a right femoral artery endarterectomy  for treatment of this.  The patient agreed to proceed and he is set up  for  procedure on August 23, 2006.   PAST MEDICAL HISTORY:  1. Noninsulin dependent diabetes mellitus.  2. 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  3. Hyperlipidemia.  4. Erectile dysfunction.  5. Renal artery stenosis.  6. Peripheral vascular occlusive disease.   PAST SURGICAL HISTORY:  1. Left superficial femoral arthrectomy with __________.  2. Right knee arthroscopy.  3. Tonsillectomy.  4. Right fem-pop bypass graft on August 07, 2005.   ALLERGIES:  NO KNOWN DRUG ALLERGIES.   MEDICATIONS:  Include:  1. Pletal 100 mg p.o. b.i.d.  2. Vytorin 10/20 mg p.o. daily.  3. Glimepiride 4 mg p.o. daily.  4. Rosiglitazone/metformin 4/100 mg p.o. b.i.d.  5. Allegra 100 mg p.o. daily p.r.n.  6. Vitamin B12, vitamin E and vitamin C.   REVIEW OF SYSTEMS:  See HPI for pertinent positives and negatives.  Otherwise, negative for chronic obstructive pulmonary disease, renal  insufficiency and thyroid disease.   SOCIAL HISTORY:  The patient is married and lives with his family.  He  has 3 children.  Patient is employed as a Psychologist, sport and exercise  at [employer].  Currently, due to the patient's pain, he is not working at  [employer].  He does still drive.  He resides in [place].  Patient denies  alcohol use.  He is a previous 1/2 to 1 pack a day smoker; however, quit  a year ago.   FAMILY HISTORY:  This is unknown, the patient is adopted.   PHYSICAL EXAMINATION:  VITAL SIGNS:  Blood pressure 130/70.  Heart rate  72.  Respirations 16.  GENERAL:  This is a 60 year old African American male not in acute  distress.  HEENT:  Normocephalic, atraumatic.  Pupils equal, round and reactive to  light and accommodation.  Extraocular movements are intact.  Oral mucosa  pink and moist.  Sclerae anicteric.  NECK:  Supple.  Probable carotid.  Patient does not have any bruits  bilaterally.  RESPIRATORY:  Symmetrical on inspiration, unlabored and clear to  auscultation bilaterally.  CARDIAC:  Regular  rate and rhythm.  No murmur, gallop or rub.  ABDOMEN:  Soft, nontender, nondistended.  Normoactive bowel sounds x4.  GU/RECTAL:  Deferred.  EXTREMITIES:  No lower extremity edema.  His lower extremities are warm.  He has 2+ bilateral radial and femoral pulses.  No popliteal, dorsalis  pedis or posterior tibial pulses on palpitation.  NEURO:  Nonfocal.  Alert and oriented x4.  Gait steady.  Muscle strength  5+ bilaterally and throughout.  Deep tendon reflexes 2+ and symmetrical.   ASSESSMENT:  Right lower extremity claudication associated with right  external iliac and femoral disease.   PLAN:  1. We will admit the patient to [facility] on August 23[facility] 2006 under the Dr. Madilyn Fireman' service.  2. The patient will undergo a right femoral endarterectomy.  3. The risks and benefits were explained to the patient in great      detail by Dr. Madilyn Fireman, who has seen and evaluated the patient prior      to admission and is in agreement with the above.      Constance Holster, PA      P. Liliane Bade, M.D.  Electronically Signed    JMW/MEDQ  D:  08/21/2006  T:  08/22/2006  Job:  [id number]

## 2010-11-17 NOTE — Discharge Summary (Signed)
NAME:  William Pacheco, William Pacheco NO.:  [id number]   MEDICAL RECORD NO.:  [id number]          PATIENT TYPE:  INP   LOCATION:  2020                         FACILITY:  [facility]   PHYSICIAN:  Balinda Quails, M.D.    DATE OF BIRTH:  [birth year]   DATE OF ADMISSION:  08/07/2005  DATE OF DISCHARGE:  08/11/2005                                 DISCHARGE SUMMARY   HISTORY OF PRESENT ILLNESS:  The patient is a 60 year old black male with a  two to three year history of bilateral lower extremity claudication  symptoms.  His symptoms have worsened over the past six to twelve months to  the point where he would develop calf cramping and severe leg pain after  walking approximately 40 feet.  He was seen by Dr. Madilyn Fireman in consultation and  underwent a Doppler study at the [facility] office which showed ankle brachial  indices of 0.47 on the right and 0.52 on the left.  Subsequent to this he  underwent a lower extremity MR angiogram which showed a short segment renal  artery stenosis with long segment occlusion of the distal right superficial  femoral artery with popliteal constitution and diseased three vessel tibial  run-off as well as left superficial femoral artery and popliteal disease  with three vessel run-off.  Dr. Madilyn Fireman then performed an angiogram which  showed a 30 to 40% bilateral renal artery stenosis with a right superficial  femoral artery occlusion, with tibial disease run off on the right and  multiple areas of left superficial femoral artery stenoses.  The patient  continued to experience lifestyle limiting claudication symptoms but denied  rest pain, nonhealing lower extremity ulcers, peripheral edema, hip, buttock  or thigh pain.  It was Dr. Madilyn Fireman' opinion that the patient should undergo a  right femoral to popliteal bypass at this time and possible left superficial  femoral artery atherectomy at a later date.  He was admitted this  hospitalization for the surgical procedure.   PAST  MEDICAL HISTORY:  1.  Type 2 diabetes mellitus.  2.  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  3.  Hyperlipidemia.  4.  Erectile dysfunction.  5.  Renal artery stenosis.  6.  Peripheral vascular occlusive disease as described above.   PAST SURGICAL HISTORY:  1.  Right knee arthroscopy in 1990.  2.  Tonsillectomy.   CURRENT MEDICATIONS:  1.  Pletal 100 mg b.i.d.  2.  Vytorin 10/20 mg daily.  3.  Glimepiride 4 mg daily.  4.  Rosiglitazone/metformin 4/100 mg p.o. b.i.d.  5.  Viagra 100 mg PRN.  6.  Vitamin B12.  7.  Vitamin E.  8.  Vitamin C. (All vitamins daily).   ALLERGIES:  No known drug allergies.   FAMILY, SOCIAL HISTORY, REVIEW OF SYSTEMS, PHYSICAL EXAMINATION:  Please see  the history and physical done at the time of admission.   HOSPITAL COURSE:  The patient underwent the following procedure on August 07, 2005:  (1)  Right femoral to popliteal bypass with 6 mm stretch Gore-Tex,  (2) Harvesting of the right saphenous vein (Note,  the saphenous vein was  harvested but was not an acceptable conduit for grafting).  The patient  tolerated the procedure well and was taken to the post anesthesia care unit  in stable condition.   Postoperatively, the patient has done well.  He has maintained stable  hemodynamics.  He has a palpable right sided dorsalis pedis pulse with  normal neurological motor and sensory function of the right lower extremity.  He has had some moderate difficulty in ambulation due to pain but has  progressed slowly each day.  Ankle brachial index Doppler study done  postoperatively on August 09, 2005 revealed improvement on the right side  to a level of 0.74.  The same study shows his left side to be a 0.68.  All  incisions are healing well without evidence of infection. His diabetes has  been under stable inpatient control and his preoperative medications have  been resumed.  Overall, his status is felt to be tentatively stable for  discharge in the  morning of August 11, 2005, pending morning round  evaluation.   CONDITION ON DISCHARGE:  Stable and improving.   DISCHARGE MEDICATIONS:  1.  He is to resume his home medications.  2.  For pain Tylox one or two q.4-6h. as needed.   DISCHARGE INSTRUCTIONS:  The patient received written instructions in  regards to medications, activities, diet, wound care and follow up.   FOLLOW UP:  Follow up will include staple removal at the CVTS office on  August 17, 2005 at 10:00 A.M.  Additionally, he will see Dr. Madilyn Fireman on  August 30, 2005 at 2:30 P.M. with ankle brachial index studies to be repeated  at that time.   FINAL DIAGNOSIS:  Severe lifestyle limiting claudication with peripheral  vascular occlusive disease as described above, now status post surgical  revascularization on the right side.   OTHER DIAGNOSES:  As previously listed per the history.   LABORATORY DATA:  Postoperative hematocrit dated August 08, 2005 is 38.4.  BUN and creatinine same date are 10 and 0.7 respectively.      Rowe Clack, P.A.-C.      Balinda Quails, M.D.  Electronically Signed    WEG/MEDQ  D:  08/10/2005  T:  08/10/2005  Job:  [id number]   cc:   Fleet Contras, M.D.  Fax: [phone]

## 2010-11-17 NOTE — Op Note (Signed)
NAME:  William Pacheco, William Pacheco NO.:  [id number]   MEDICAL RECORD NO.:  [id number]          PATIENT TYPE:  INP   LOCATION:  3313                         FACILITY:  [facility]   PHYSICIAN:  Balinda Quails, M.D.    DATE OF BIRTH:  1951-05-07   DATE OF PROCEDURE:  08/07/2005  DATE OF DISCHARGE:                                 OPERATIVE REPORT   SURGEON:  P Bud Face, MD   ASSISTANT:  Fabienne Bruns, MD; Jerold Coombe, PA.   ANESTHETIC:  General endotracheal.   PREOPERATIVE DIAGNOSIS:  Claudication right lower extremity.   POSTOPERATIVE DIAGNOSIS:  Claudication right lower extremity.   PROCEDURE.:  1.  Right femoral-popliteal bypass with 6 mm stretch Gore-Tex graft.  2.  Harvesting right greater saphenous vein.   CLINICAL NOTE:  Mr. Cloninger is a 60 year old diabetic male with history of  tobacco use. He was seen in the office with bilateral claudication symptoms.  Arteriography revealed a long segment right superficial femoral artery  occlusion, areas of multiple stenoses of left superficial femoral artery. He  is brought the operating at this time for planned right femoral-popliteal  bypass.   OPERATIVE PROCEDURE:  The patient brought to the operating room stable  condition. Placed in supine position. General endotracheal anesthesia  induced. The right leg was prepped and draped in sterile fashion.   Oblique skin incision made through the right groin. Dissection carried down  through subcutaneous tissue with electrocautery. The saphenofemoral junction  exposed. Tributaries of saphenous vein ligated with 3-0 silk and divided.  Saphenous vein then exposed throughout the length of right thigh through  separated longitudinal skin incisions. Vein was noted to become small in  caliber in the distal right thigh. This was harvested down to the mid calf  level. The vein was very small.  Tributaries of vein ligated 3-0 silk and  divided. The vein harvested proximally  distally ligated with 2-0 silk. The  distal portion of vein was too small for cannulation. The vein trimmed  proximally. Was cannulated with a vein cannula. However, the vein was very  small in caliber. This was felt to be in adequate for femoral popliteal  bypass.   The right femoral vessels were then exposed. The common femoral artery  mobilized and encircled with vessel loop. The profunda and superficial  femoral arteries were encircled with vessel loops. The distal right common  femoral artery was relatively soft and adequate for grafting.   The popliteal artery was exposed through a medial approach. The  gastrocnemius muscle taken down. The distal right popliteal artery exposed.  Freed from the popliteal vein and encircled with vessel loop. This was  moderately diseased with plaque.   A subsartorial tunnel made between the two incisions. A 6 mm intraring graft  was placed through the tunnel. The intraring Gore-Tex graft was placed  through the tunnel.   The patient administered 7000 units heparin intravenously. Right femoral  vessels controlled with clamps. Longitudinal arteriotomy made in the right  common femoral artery. The graft was beveled and anastomosed end-to-side  with right common femoral artery using  running 6-0 Prolene suture. At  completion of the proximal anastomosis femoral vessels were flushed.  Excellent flow present through the graft. The graft controlled with a  fistula clamp.   The popliteal artery exposed. Controlled proximally and distally with  clamps. Longitudinal arteriotomy made. This was moderately to severely  diseased. Short endarterectomy was carried out. The graft was beveled and  anastomosed end-to-side to the popliteal artery using running 6-0 Prolene  suture. At completion of the distal anastomosis, adequate flushing carried  out. Clamps were then removed. Excellent signals present in the right  dorsalis pedis and posterior tibial arteries.    Adequate hemostasis obtained. The patient administered 50 milligrams  protamine intravenously.   The vein harvest bed was drained with a 15 round Blake drain, exited  inferiorly through the skin, fixed to skin with 2-0 silk suture.   The groin incision was then closed with a deep layer of running 2-0 Vicryl  suture. Some superficial subcutaneous layer of running 2-0 Vicryl suture.  Staples applied to skin. Vein harvest incisions were closed with single  layer of running 2-0 Vicryl suture and staples applied to skin.   Below-the-knee the fascia was closed with running 2-0 Vicryl suture.  Subcutaneous tissue closed running 3-0 Vicryl suture. Skin closed with  staples. Sterile dressings applied.   There no apparent complications. The patient transferred to recovery in  stable condition.      Balinda Quails, M.D.  Electronically Signed     PGH/MEDQ  D:  08/07/2005  T:  08/07/2005  Job:  [id number]   cc:   Fleet Contras, M.D.  Fax: [phone]

## 2010-11-17 NOTE — Op Note (Signed)
NAME:  William Pacheco, William Pacheco               ACCOUNT NO.:  [id number]   MEDICAL RECORD NO.:  [id number]          PATIENT TYPE:  AMB   LOCATION:  SDS                          FACILITY:  [facility]   PHYSICIAN:  Balinda Quails, M.D.    DATE OF BIRTH:  Sep 19, 1950   DATE OF PROCEDURE:  03/01/2006  DATE OF DISCHARGE:  03/01/2006                                 OPERATIVE REPORT   PHYSICIAN:  Balinda Quails, M.D.   PREOPERATIVE DIAGNOSIS:  Recurrent bilateral claudication.   POSTOPERATIVE DIAGNOSIS:  Recurrent bilateral claudication.   PROCEDURE:  1. Abdominal aortogram.  2. Bilateral selective lower extremity arteriograms.   ACCESS:  Left common femoral artery 5-French sheath.   CONTRAST:  150 mL Visipaque.   COMPLICATIONS:  None apparent   CLINICAL NOTE:  William Pacheco is a 60 year old diabetic male with history of  peripheral vascular disease.  He has previously undergone right femoral-  popliteal bypass and a left superficial femoral artery atherectomy.  He had  initial relief of his symptoms following these procedures, however, has  developed recurrent claudication symptoms.  Doppler evaluation revealed  evidence of disease proximal to his right femoral-popliteal bypass and  recurrent left superficial femoral artery disease.  He is brought to the  cath lab at this time for diagnostic arteriography.   PROCEDURE NOTE:  The patient was brought to the cath lab in stable  condition.  He was placed in the supine position.  Both groins were prepped  and draped in a sterile fashion.  The skin and subcutaneous tissue of the  left groin was instilled with 1% Xylocaine.  A needle was easily introduced  in the left common femoral artery.  A 0.035 J-wire was passed through the  needle into the abdominal aorta.  The needle was removed and a 5-French  sheath was advanced over the guidewire, dilator removed, and the sheath  flushed with heparin saline solution.  A pigtail catheter was advanced over  the  guidewire to the mid abdominal aorta.  Standard AP mid abdominal  aortogram was obtained.  This revealed the infrarenal aorta to be widely  patent.  The common iliac arteries were widely patent bilaterally.  The  external iliac and hypogastric arteries were widely patent bilaterally.  The  pigtail catheter was then brought close to the renal arteries.  A magnified  view of the renal arteries was obtained.  This revealed the right renal  artery to be widely patent.  There was a mild stenosis at the origin of the  left renal artery.  This was estimated to be less than 30%.   The pigtail catheter then brought down to the aortic bifurcation.  Lower  extremity arteriography was obtained.  This revealed the right common  femoral artery to have significant atherosclerotic disease with a large  plaque and a high-grade stenosis.  The right femoral-popliteal bypass graft  was patent.  The right lower extremity runoff was via the anterior tibial  and peroneal arteries.   The left lower extremity revealed recurrent disease of the left superficial  femoral artery with three distinct areas  of restenosis.  The popliteal  artery was patent.  The left calf revealed an aberrant takeoff of the left  anterior tibial artery which was dominant runoff.  The peroneal and  posterior tibial arteries were patent.   The guidewire was then reinserted through the pigtail catheter which was  hooked on the aortic bifurcation and the guidewire passed down into the  right external iliac artery.  An Indole catheter was then passed down over  the guidewire to the right external iliac artery.  An oblique view of the  right common femoral artery was obtained.  This verified significant disease  in the right common femoral artery with high grade stenosis and irregular  disease.   A magnified view of the distal right femoral popliteal anastomosis was also  obtained.  This revealed the anastomosis to be widely patent.   The  guidewire was then reinserted and the catheter removed.  Retrograde  injection was then made through the left femoral sheath.  This verified  recurrent disease in the left superficial femoral artery with three distinct  areas of restenosis in the proximal, mid, and distal left superficial  femoral artery.   This completed the arteriogram procedure.  The left femoral sheath was  removed.  No apparent complications.   FINAL IMPRESSION:  1. Mild left renal artery stenosis estimated to be 30%.  2. No significant aortoiliac occlusive disease.  3. Severe stenosis of the right common femoral artery proximal to the      right femoral-popliteal bypass.  4. Patent right femoral-popliteal bypass.  5. Recurrent left superficial femoral artery stenosis in three distinct      areas.   DISPOSITION:  These results will be reviewed with the patient and a final  decision made regarding further management.      Balinda Quails, M.D.  Electronically Signed     PGH/MEDQ  D:  03/01/2006  T:  03/01/2006  Job:  [id number]   cc:   Fleet Contras, M.D.

## 2010-11-17 NOTE — Op Note (Signed)
NAME:  William Pacheco, William Pacheco NO.:  [id number]   MEDICAL RECORD NO.:  [id number]          PATIENT TYPE:  INP   LOCATION:  2019                         FACILITY:  [facility]   PHYSICIAN:  Balinda Quails, M.D.    DATE OF BIRTH:  06-01-1951   DATE OF PROCEDURE:  09/16/2006  DATE OF DISCHARGE:                               OPERATIVE REPORT   SURGEON:  P Bud Face, MD   ASSISTANT:  Charlesetta Garibaldi, PA.   ANESTHETIC:  General endotracheal.   ANESTHESIOLOGIST:  Dr. Gypsy Balsam.   PREOPERATIVE DIAGNOSES:  1. Right lower extremity claudication.  2. Right common femoral artery stenosis.   POSTOPERATIVE DIAGNOSES:  1. Right lower extremity claudication.  2. Right common femoral artery stenosis.   PROCEDURE:  Right common femoral artery endarterectomy with Dacron patch  angioplasty.   CLINICAL NOTE:  William Pacheco is a 60 year old diabetic male with  peripheral vascular disease.  He has previously undergone right lower  extremity revascularization with femoral-popliteal grafting using Gore-  Tex graft due to inadequate saphenous vein.  He developed recurrent  claudication symptoms.  Workup for this revealed severe right common  femoral artery stenosis.  He is brought to the operating room at this  time for right femoral endarterectomy.   OPERATIVE PROCEDURE:  The patient brought to the operating room in  stable hemodynamic condition.  Placed under general endotracheal  anesthesia.  The right leg prepped and draped in sterile fashion.   Longitudinal skin incision made through the scar in the right groin.  Dissection carried down to subcutaneous tissue and lymphatics with  electrocautery.  The inguinal ligament identified.  The common femoral  artery encircled at the inguinal ligament with vessel loop.  Distal  dissection carried along the inguinal ligament to expose the origin of  the right femoral-popliteal bypass graft.  This was a Gore-Tex graft  which was freed and encircled  with vessel loop.  The superficial femoral  artery origin and profunda femoris artery origins were then freed and  encircled with vessel loops.   Further proximal dissection was carried up the common femoral artery up  under the inguinal ligament to the level of the external iliac.  The  circumflex, medial and lateral circumflex vessels were encircled with  vessel loops.   The patient administered 7000 units heparin intravenously.  The femoral  vessels controlled with clamps.  A longitudinal arteriotomy then made in  the right common femoral artery.  This revealed extensive plaque with a  high-grade stenosis.  The arteriotomy was extended proximally up into  the external iliac.  The arteriotomy then carried distally down over the  hood of the femoral-popliteal graft.  The common femoral artery was then  endarterectomized with a elevator.  The endarterectomy completed prior  to the origin of the profunda femoris.   A patch angioplasty was then carried out of right common femoral artery  with a Dacron Finesse patch using running 6-0 Prolene suture.  At  completion of the patch angioplasty, adequate flushing carried out.  Clamps were removed.  Excellent flow present into the right foot.  Adequate hemostasis obtained.  The patient administered 50 mg protamine  intravenously.   Subcutaneous tissue then closed running 2-0 Vicryl suture in two the  deep layers.  Superficial subcutaneous layer closed running 3-0 Vicryl  suture.  Skin closed with 4-0 Monocryl.  Dermabond applied along with a  sterile dressing.  The patient tolerated procedure well.  There were no  apparent complications.  Transferred to recovery room in stable  condition.      Balinda Quails, M.D.  Electronically Signed     PGH/MEDQ  D:  09/16/2006  T:  09/17/2006  Job:  [id number]   cc:   Fleet Contras, M.D.

## 2010-11-17 NOTE — H&P (Signed)
NAME:  William Pacheco, William Pacheco NO.:  [id number]   MEDICAL RECORD NO.:  [id number]          PATIENT TYPE:  INP   LOCATION:  NA                           FACILITY:  [facility]   PHYSICIAN:  Balinda Quails, M.D.    DATE OF BIRTH:  01-Mar-1951   DATE OF ADMISSION:  DATE OF DISCHARGE:                                HISTORY & PHYSICAL   DATE OF ADMISSION:  August 07, 2005   CHIEF COMPLAINT:  Leg pain.   HISTORY OF PRESENT ILLNESS:  The patient is a 59 year old black male with a  2- to 3-year history of bilateral lower extremity claudication symptoms. His  symptoms have worsened over the past 6-12 months to the point where he now  develops calf cramping and severe leg pain after walking approximately 40  feet. He was seen by Dr. Madilyn Fireman and underwent Doppler studies in our office  which showed ankle-brachial indices of 0.47 on the right and 0.52 on the  left. He underwent a lower extremity MR angiogram which showed a short  segment renal artery stenosis with long segment occlusion of the distal  right SFA with popliteal reconstitution, and diseased three-vessel tibial  runoff, as well as left SFA and popliteal disease with three-vessel runoff.  Dr. Madilyn Fireman performed an angiogram which showed 30-40% bilateral renal artery  stenoses with a right superficial femoral artery occlusion, with diseased  tibial vessel runoff on the right and multiple areas of left superficial  femoral artery stenosis. The patient continues to experience lifestyle-  limiting claudication symptoms but denies rest pain, nonhealing lower  extremity ulcers, peripheral edema, hip, buttock or thigh pain. It was Dr.  Madilyn Fireman' opinion that he should undergo a right femoral to popliteal bypass  graft at this time with a possible left superficial femoral artery  atherectomy at a later date.   PAST MEDICAL HISTORY:  1.  Type 2 diabetes mellitus, non-insulin dependent.  2.  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  3.   Hyperlipidemia.  4.  Erectile dysfunction.  5.  Renal artery stenosis.  6.  Peripheral vascular occlusive disease as above.   PAST SURGICAL HISTORY:  1.  Right knee arthroscopy in 1990.  2.  Tonsillectomy.   HOME MEDICATIONS:  1.  Pletal 100 mg b.i.d.  2.  Vytorin 10/20 mg daily.  3.  Glimepiride 4 mg daily.  4.  Rosiglitazone/metformin 4/100 mg b.i.d.  5.  Viagra 100 mg daily p.r.n.  6.  Vitamin B12, vitamin E and vitamin C daily.   ALLERGIES:  No known drug allergies.   SOCIAL HISTORY:  He is married and resides in [place] with his wife. They  have three children. 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 He previously smoked one-half to one pack of cigarettes per day  and quit approximately 2 months ago after smoking since age 59. He denies  alcohol use.   FAMILY HISTORY:  Unknown as the patient is adopted.   REVIEW OF SYSTEMS:  See history of present illness for pertinent positives  and negatives. He is  diabetic and states that his blood sugars have been  erratic lately, running somewhere in the 150-170 range. He wears reading  glasses. Otherwise, he denies other symptoms such as fevers, chills, weight  loss, recent infections, TIA symptoms, visual changes, amaurosis fugax,  syncope, presyncope, dysphagia, dysarthria, weakness, fatigue, chest pain,  heart palpitations, shortness of breath, orthopnea, PND, cough, wheezing,  abdominal pain, nausea, vomiting, diarrhea, constipation, hemoptysis,  hematemesis, melena, hematuria, dysuria or nocturia, lower extremity edema,  muscle or joint problems, intolerance to heat or cold, anxiety or  depression.   PHYSICAL EXAMINATION:  VITAL SIGNS:  Blood pressure is 132/84, heart rate  100 and regular, respirations 20 and unlabored.  GENERAL:  This is a well-developed, well-nourished black male in no acute  distress.  HEENT:  Normocephalic, atraumatic. Pupils equal, round and react to light  and  accommodation. Extraocular movements intact. TMs and canals are clear.  Nares patent bilaterally. Oropharynx is clear with moist mucous membranes.  NECK:  Supple without lymphadenopathy, thyromegaly or carotid bruits.  HEART:  Regular rate and rhythm without murmurs, rubs or gallops.  LUNGS:  Clear to auscultation.  ABDOMEN:  Soft, nontender, nondistended with active bowel sounds in all  quadrants. No masses or hepatosplenomegaly. He is mildly obese.  EXTREMITIES:  No clubbing, cyanosis or edema. He has 2+ femoral pulses and  no palpable pedal pulses bilaterally.  NEUROLOGIC:  Cranial nerves II-XII grossly intact. He is alert and oriented  x3. Gait is within normal limits. He has 5+ upper and lower muscle strength  which is symmetrical.   ASSESSMENT AND PLAN:  This is a 60 year old male with bilateral peripheral  vascular occlusive disease with lifestyle-limiting claudication. He will be  admitted to [facility] on August 07, 2005, and will undergo a  right femoral to popliteal bypass by Dr. Liliane Bade.      Coral Ceo, P.A.      Balinda Quails, M.D.  Electronically Signed   GC/MEDQ  D:  08/03/2005  T:  08/03/2005  Job:  [id number]   cc:   Fleet Contras, M.D.  Fax: [phone]

## 2010-11-17 NOTE — Op Note (Signed)
NAME:  William Pacheco, William Pacheco               ACCOUNT NO.:  [id number]   MEDICAL RECORD NO.:  [id number]          PATIENT TYPE:  AMB   LOCATION:  SDS                          FACILITY:  [facility]   PHYSICIAN:  Balinda Quails, M.D.    DATE OF BIRTH:  08-03-1950   DATE OF PROCEDURE:  07/13/2005  DATE OF DISCHARGE:  07/13/2005                                 OPERATIVE REPORT   PHYSICIAN:  Denman George, MD   DIAGNOSIS:  Bilateral lower extremity claudication.   PROCEDURE:  Abdominal aortogram with bilateral lower extremity runoff  arteriography.   ACCESS:  Right common femoral artery 5-French sheath.   CONTRAST:  180 mL of Visipaque.   COMPLICATIONS:  None apparent.   CLINICAL NOTE:  William Pacheco is a 60 year old male diabetic with history of  heavy tobacco use who was seen in the office with significant bilateral  claudication symptoms.  He is quite active.  Holds 2 jobs.  Brought to the  cath lab at this time for diagnostic arteriography.   PROCEDURE NOTE:  The patient was brought to the cath lab in stable  condition.  Placed in supine position.  Both groins were prepped and draped  in sterile fashion.  Skin and subcutaneous tissue in the right groin were  instilled with 1% Xylocaine.  The patient was administered 50 mcg of  fentanyl intravenously.   A needle was easily introduced into the right common femoral artery.  A  0.035 J-wire passed through the needle into the mid-abdominal aorta under  fluoroscopy.  The needle was removed, 5-French sheath advanced over the  guidewire.  The dilator was removed and sheath flushed with heparin and  saline solution.   A standard pigtail catheter was then advanced over the guidewire to the mid-  abdominal aorta.  Standard AP mid-abdominal aortogram obtained.  Single  renal arteries were present bilaterally.  Proximal renal artery stenosis was  noted bilaterally.  This was estimated to be approximately 40% on the right  and approximately 30% on the  left.  There was no evidence of significant  ostial renal artery stenosis.  The infrarenal aorta was widely patent.  Mild  irregularity was noted.  The common iliac arteries, external iliac arteries  and hypogastric arteries were widely patent bilaterally.   The pigtail catheter was then brought down to the aortic bifurcation and  lower extremity runoff arteriography obtained.  Posterior plaque was noted  in the common femoral arteries bilaterally.   The right leg revealed a large widely patent profunda femoris artery.  The  proximal right superficial femoral artery revealed multiple areas of  stenosis estimated to be 50% to 70%.  The right superficial femoral artery  then occluded in the mid-thigh.  There was reconstitution of the popliteal  artery above the knee. There was intact three-vessel tibial artery runoff  present; however, the tibial arteries were noted to be diseased with  multiple areas of stenosis.   The left leg revealed posterior common femoral plaque.  The left superficial  femoral artery was patent throughout its course.  There were, however,  multiple  areas of stenosis present within the left superficial femoral  artery.  Two distinct areas of high-grade stenosis were noted in the  proximal and midportion of the left superficial femoral artery.  There was  intact flow to the popliteal level.  There was aberrant takeoff of the left  anterior tibial artery.  There was three-vessel tibial artery runoff in the  left lower extremity.   Oblique views of the pelvis were obtained bilaterally.  These verified no  evidence of significant iliac artery occlusive disease.   This completed the arteriogram procedure.  The guidewire was reinserted,  pigtail catheter removed.  No apparent complications.  Right femoral sheath  removed.   FINAL IMPRESSION:  1.  Single bilateral renal arteries.  Bilateral proximal renal artery      stenosis estimated to be 30% to 40%.  2.  Intact  aortoiliac segment.  3.  Right superficial femoral artery occlusion with diseased right tibial      artery runoff.  4.  Multiple areas of left superficial femoral artery stenosis.   DISPOSITION:  These results have been reviewed with the patient.  The  patient will be considered for left superficial femoral atherectomy and  right femoropopliteal bypass.      Balinda Quails, M.D.  Electronically Signed     PGH/MEDQ  D:  07/13/2005  T:  07/14/2005  Job:  [id number]   cc:   [facility] Peripheral Vascular Cath Lab   Fleet Contras, M.D.  Fax: [phone]

## 2010-11-17 NOTE — Discharge Summary (Signed)
NAME:  William Pacheco, William Pacheco NO.:  [id number]   MEDICAL RECORD NO.:  [id number]          PATIENT TYPE:  INP   LOCATION:  3309                         FACILITY:  [facility]   PHYSICIAN:  William Pacheco, M.D.    DATE OF BIRTH:  Apr 29, 1951   DATE OF ADMISSION:  03/15/2008  DATE OF DISCHARGE:  03/16/2008                               DISCHARGE SUMMARY   ADMISSION DIAGNOSIS:  Peripheral vascular disease with bilateral  claudication.   DISCHARGE/SECONDARY DIAGNOSES:  1. Peripheral vascular disease with bilateral claudication, status      post left external iliac and common femoral endarterectomy.  2. Coronary artery disease with history of coronary artery bypass      grafting.  3. History of right femoropopliteal bypass artery bypass and left      superficial femoral artery stenting.  4. Hypertension.  5. Hyperlipidemia.  6. Diabetes mellitus type 2.  7. Remote history of tobacco abuse.   ALLERGIES:  No known drug allergies.   PROCEDURES:  On March 15, 2008, left external iliac and common  femoral endarterectomy with Dacron patch angioplasty.   BRIEF HISTORY:  William Pacheco is a 60 year old with diabetic mellitus and  history of atherosclerotic vascular disease.  He has ischemic  cardiomyopathy and has undergone femoral artery bypass graft surgery.  He has been followed by Dr. Madilyn Fireman for his peripheral vascular disease  and has history of bilateral claudication.  He has undergone right  femoropopliteal bypass and left superficial femoral artery stenting.  He  has recently developed recurrent symptoms of claudication in his lower  extremities.  A work up revealed evidence of common femoral stenosis  bilaterally verified by arteriography.  Dr. Madilyn Fireman recommended bilateral  femoral endarterectomies for relief of this symptoms.   HOSPITAL COURSE:  William Pacheco  was admitted to [facility] on  March 15, 2008.  He underwent the previously mentioned procedure.  As  stated, the initial plan was for him to undergo bilateral femoral  endarterectomies but ultimately decision was made to only operate on the  left side with plans to consider possible percutaneous intervention of  the right femoral endarterectomy in the future.  Postoperatively, Mr.  Pacheco was extubated, neurologically intact.  He was taken to the step-  down unit where he remained until discharge.  He had uneventful  postoperative course.  Vitals remained stable.   LABORATORIES:  Showed sodium of 138, potassium 4.2, glucose 129, BUN 8,  and creatinine 0.74.  White count of 8.8, hemoglobin 13.3, hematocrit  38.7, and platelet count 228.   He had palpable dorsalis pedis pulse on the left postoperatively and  groin incision was healing well without sings of hematoma infection.  On  the morning postop day 1, his diet was advanced.  Foley catheter was  discontinued and began to mobilize.  By that afternoon, he had met  criteria for discharge and discharged home in stable and improved  condition.   DISCHARGE MEDICATIONS:  1. Ultram 50 mg one-two tablets p.o. q.6 h. p.r.n. pain.  2. Ramipril 5 mg daily.  3. Simvastatin 80 mg daily.  4. Cilostazol 100 mg b.i.d.  5. Coreg 25 mg b.i.d.  6. Aspirin 325 mg daily.  7. Lantus insulin 45 units subcutaneously daily.  8. Avandamet 09/998 mg b.i.d.  9. Glimepiride 4 mg daily.  10.Plavix 75 mg daily.   DISCHARGE INSTRUCTIONS:  He will continue diabetic appropriate diet.  May shower and clean the incision daily with soap and water.  No driving  or heavy lifting over the next 2 weeks.  See Dr. Madilyn Fireman in approximately  3 weeks with a ABI.  He should call sooner if he develops fever greater  than 101, redness, purulent drainage from the incision, or increase  pain.      William Pacheco, P.A.      William Pacheco, M.D.  Electronically Signed    AWZ/MEDQ  D:  03/17/2008  T:  03/18/2008  Job:  [id number]   cc:   Fleet Contras, M.D.  Cristy Hilts.  Jacinto Halim, MD

## 2010-11-17 NOTE — Op Note (Signed)
NAME:  William Pacheco, William Pacheco               ACCOUNT NO.:  [id number]   MEDICAL RECORD NO.:  [id number]          PATIENT TYPE:  AMB   LOCATION:  SDS                          FACILITY:  [facility]   PHYSICIAN:  Balinda Quails, M.D.    DATE OF BIRTH:  05/08/1951   DATE OF PROCEDURE:  10/12/2005  DATE OF DISCHARGE:  10/12/2005                                 OPERATIVE REPORT   DIAGNOSIS:  Left lower extremity claudication.   PROCEDURE:  1.  Left lower extremity arteriogram.  2.  Left superficial femoral atherectomy with [place] SilverHawk Device.   ACCESS:  Right common femoral artery 7-French sheath.   CONTRAST:  200 mL Visipaque.   COMPLICATIONS:  None apparent.   CLINICAL NOTE:  William Pacheco is a 60 year old male with diabetes and  peripheral vascular disease.  He has previously undergone a right femoral-  popliteal bypass.  He has significant limiting claudication of his left  lower extremity.  Previous arteriographies revealed diffuse left superficial  femoral artery occlusive disease.  There, however, are areas of focal  stenosis.  He is brought to the catheterization lab at this time for planned  left superficial femoral artery atherectomy.   PROCEDURE NOTE:  The patient brought to the cath lab in stable condition.  Placed in supine position.  The right leg prepped and draped in a sterile  fashion.  Skin and subcutaneous tissue in the right groin instilled with 1%  Xylocaine.  The patient administered 1 mg of Versed and 50 mcg of femoral  intravenously.  He received an additional 1 mg of Versed during the  procedure.   The right common femoral artery accessed with an 18-gauge needle.  A 0.035  Wholey Guidewire advanced through the needle into the mid abdominal aorta.  A long 7-French Terumo Sheath was then advanced over guidewire.  The dilator  removed.  A crossover catheter was then advanced through the guidewire and  engaged in the left common iliac artery.  The [facility] Guidewire  was then  advanced down into left external iliac artery.  The crossover catheter was  advanced down over the guidewire into the left external iliac artery.  The  guidewire removed and exchanged for an Amplatz Super-Stiff Guidewire.  With  the Amplatz Super-Stiff Guidewire in the left external iliac artery, the  Terumo Sheath was then advanced across the bifurcation into the left  external iliac artery.   The left lower extremity arteriography then obtained with injection of  contrast through the left femoral sheath.  This revealed diffuse left  superficial femoral artery occlusive disease.  There, however, were two  distinct areas of high-grade stenosis present in the left superficial  femoral artery at the mid thigh and at the adductor canal level.  A 0.014  stabilizer guidewire was then advanced through the sheath and down across  the lesions and into the left popliteal artery.  The patient administered  7000 units of heparin intravenously.  ACT measured 377 seconds.   The SilverHawk LS Device was then advanced over the guidewire.  The mid left  superficial femoral artery lesion  was then treated with the SilverHawk  Atherectomy Device for a total of eight passes.  At completion of this, an  arteriography revealed excellent technical result with minimal residual  stenosis.  The device was then passed down to the distal lesion at the  adductor canal.  This was treated with a total of 11 passes of the  atherectomy device.  A arteriogram obtained and revealed excellent technical  result with minimal residual stenosis.   The device was then drawn back up into the proximal left superficial femoral  artery where there was an area of irregular stenosis felt to be moderate in  severity.  This was treated with four passes of the atherectomy device.  At  completion, a final left lower extremity arteriogram was obtained.  This  revealed an excellent technical result.  Areas of significant  stenosis were  relieved, and there were no lesions greater than 20%.  A left calf image was  obtained, and all three tibial vessels remained intact with antegrade flow  at termination of the procedure and no evidence of significant distal  embolization.   This completed the arteriogram procedure.  The [facility] Guidewire was then  reinserted and the Terumo Sheath removed and a short 7-French sheath placed  in the right groin.  The patient then transferred to the holding area  without apparent complication.   FINAL IMPRESSION:  1.  Left lower extremity claudication secondary to multiple areas of      stenosis and left superficial femoral artery.  2.  Successful atherectomy, left superficial femoral artery, with the      SilverHawk LS Atherectomy Device.   DISPOSITION:  These results have been reviewed with the patient and family.  The patient will be discharged and follow-up arranged the office.      Balinda Quails, M.D.  Electronically Signed     PGH/MEDQ  D:  11/16/2005  T:  11/16/2005  Job:  [id number]

## 2010-11-17 NOTE — Procedures (Signed)
BYPASS GRAFT EVALUATION   INDICATION:  Followup bilateral lower extremity revascularizations.   HISTORY:  Diabetes:  Yes.  Cardiac:  CABG.  Hypertension:  Yes.  Smoking:  Previous.  Previous Surgery:  Right fem-pop bypass graft on 08/07/2005, right  femoral artery endarterectomy on 09/16/2006, left superficial femoral  artery atherectomy in September of 2007, left superficial femoral artery  PTA and stent on 01/24/2007, left external iliac and common femoral  artery endarterectomies on 03/15/2008.   SINGLE LEVEL ARTERIAL EXAM                               RIGHT              LEFT  Brachial:                    148                151  Anterior tibial:             116                111  Posterior tibial:            112                123  Peroneal:  Ankle/brachial index:        0.77               0.81   PREVIOUS ABI:  Date:  06/10/2008  RIGHT:  0.72  LEFT:  0.75   LOWER EXTREMITY BYPASS GRAFT DUPLEX EXAM:   DUPLEX:  Monophasic to biphasic Doppler waveforms noted throughout the  right lower extremity bypass graft and its native vessels with a mildly  elevated velocity of 250 cm/s noted at the right common femoral artery  level.  Monophasic to biphasic Doppler waveforms noted throughout the left  external iliac, common femoral, superficial femoral and popliteal  arteries with mildly increased velocities of 242 cm/s and 246 cm/s noted  at the left common femoral and mid superficial femoral artery levels  respectively.   IMPRESSION:  1. Patent right fem-pop bypass graft, left superficial femoral artery      stent, and bilateral endarterectomy sites with increased velocities      noted, as described above.  2. Stable bilateral ankle brachial indices noted.         ___________________________________________  P. Liliane Bade, M.D.   CH/MEDQ  D:  09/29/2008  T:  09/29/2008  Job:  [id number]

## 2011-03-29 LAB — COMPREHENSIVE METABOLIC PANEL
ALT: 24
AST: 18
Albumin: 3.7
Alkaline Phosphatase: 30 — ABNORMAL LOW
BUN: 11
CO2: 26
Calcium: 8.9
Chloride: 108
Creatinine, Ser: 0.83
GFR calc Af Amer: >60
GFR calc non Af Amer: >60
Glucose, Bld: 155 — ABNORMAL HIGH
Potassium: 4.3
Sodium: 140
Total Bilirubin: 0.9
Total Protein: 6.3

## 2011-03-29 LAB — CBC
HCT: 38.5 — ABNORMAL LOW
Hemoglobin: 13.3
MCHC: 34.5
MCV: 88.9
Platelets: 240
RBC: 4.33
RDW: 15.5
WBC: 4.5

## 2011-03-29 LAB — PROTIME-INR
INR: 1
Prothrombin Time: 12.9

## 2011-03-29 LAB — APTT: aPTT: 32

## 2011-04-02 LAB — BASIC METABOLIC PANEL
BUN: 8
CO2: 27
Calcium: 8.8
Chloride: 104
Creatinine, Ser: 0.74
GFR calc Af Amer: >60
GFR calc non Af Amer: >60
Glucose, Bld: 129 — ABNORMAL HIGH
Potassium: 4.2
Sodium: 138

## 2011-04-02 LAB — CBC
HCT: 38.7 — ABNORMAL LOW
Hemoglobin: 13.3
MCHC: 34.3
MCV: 89.3
Platelets: 228
RBC: 4.33
RDW: 15.2
WBC: 8.8

## 2011-04-02 LAB — GLUCOSE, CAPILLARY: Glucose-Capillary: 142 — ABNORMAL HIGH

## 2011-04-04 LAB — COMPREHENSIVE METABOLIC PANEL
ALT: 17
AST: 16
Albumin: 3.9
Alkaline Phosphatase: 39
BUN: 14
CO2: 24
Calcium: 9.4
Chloride: 106
Creatinine, Ser: 1
GFR calc Af Amer: >60
GFR calc non Af Amer: >60
Glucose, Bld: 145 — ABNORMAL HIGH
Potassium: 4.9
Sodium: 139
Total Bilirubin: 1.1
Total Protein: 6.5

## 2011-04-04 LAB — GLUCOSE, CAPILLARY
Glucose-Capillary: 122 — ABNORMAL HIGH
Glucose-Capillary: 150 — ABNORMAL HIGH
Glucose-Capillary: 176 — ABNORMAL HIGH

## 2011-04-04 LAB — CBC
HCT: 40.5
Hemoglobin: 13.7
MCHC: 33.8
MCV: 88.6
Platelets: 234
RBC: 4.57
RDW: 14.7
WBC: 5.3

## 2011-04-04 LAB — URINALYSIS, ROUTINE W REFLEX MICROSCOPIC
Bilirubin Urine: NEGATIVE
Hgb urine dipstick: NEGATIVE
Ketones, ur: NEGATIVE
Nitrite: NEGATIVE
Urobilinogen, UA: 1

## 2011-04-04 LAB — TYPE AND SCREEN: Antibody Screen: NEGATIVE

## 2011-04-04 LAB — PROTIME-INR
INR: 0.9
Prothrombin Time: 12.7

## 2011-04-04 LAB — APTT: aPTT: 31

## 2011-04-13 LAB — I-STAT EC8
Acid-base deficit: 5 — ABNORMAL HIGH
Hemoglobin: 8.5 — ABNORMAL LOW
Potassium: 4.1
Sodium: 143
TCO2: 21

## 2011-04-13 LAB — POCT I-STAT 4, (NA,K, GLUC, HGB,HCT)
Glucose, Bld: 158 — ABNORMAL HIGH
Glucose, Bld: 193 — ABNORMAL HIGH
HCT: 25 — ABNORMAL LOW
HCT: 25 — ABNORMAL LOW
HCT: 33 — ABNORMAL LOW
HCT: 33 — ABNORMAL LOW
Hemoglobin: 11.2 — ABNORMAL LOW
Hemoglobin: 11.2 — ABNORMAL LOW
Hemoglobin: 12.2 — ABNORMAL LOW
Hemoglobin: 8.5 — ABNORMAL LOW
Operator id: 252761
Operator id: 3402
Operator id: 3402
Potassium: 4.5
Potassium: 5.3 — ABNORMAL HIGH
Sodium: 135
Sodium: 136
Sodium: 136

## 2011-04-13 LAB — POCT I-STAT 3, ART BLOOD GAS (G3+)
Acid-base deficit: 2
Acid-base deficit: 3 — ABNORMAL HIGH
Bicarbonate: 21.7
Bicarbonate: 23
O2 Saturation: 100
Operator id: 252761
Operator id: 257021
Operator id: 3402
Operator id: 176311
Patient temperature: 37.3
TCO2: 25
pCO2 arterial: 44.9
pCO2 arterial: 47.4 — ABNORMAL HIGH
pCO2 arterial: 39.2
pH, Arterial: 7.302 — ABNORMAL LOW
pH, Arterial: 7.348 — ABNORMAL LOW
pH, Arterial: 7.403
pO2, Arterial: 87
pO2, Arterial: 68 — ABNORMAL LOW

## 2011-04-13 LAB — APTT
aPTT: 33
aPTT: 33

## 2011-04-13 LAB — CBC
HCT: 26.8 — ABNORMAL LOW
HCT: 26.8 — ABNORMAL LOW
Hemoglobin: 10.8 — ABNORMAL LOW
Hemoglobin: 13.2
Hemoglobin: 9.2 — ABNORMAL LOW
MCHC: 34.1
MCV: 86.3
MCV: 86.7
Platelets: 145 — ABNORMAL LOW
Platelets: 149 — ABNORMAL LOW
RBC: 3.63 — ABNORMAL LOW
RBC: 4.49
RDW: 14.8 — ABNORMAL HIGH
RDW: 15.1 — ABNORMAL HIGH
WBC: 8.1
WBC: 9
WBC: 9.2

## 2011-04-13 LAB — POCT I-STAT 3, VENOUS BLOOD GAS (G3P V)
Bicarbonate: 21
O2 Saturation: 58
TCO2: 22
pCO2, Ven: 44 — ABNORMAL LOW
pH, Ven: 7.287
pO2, Ven: 34

## 2011-04-13 LAB — URINALYSIS, ROUTINE W REFLEX MICROSCOPIC
Bilirubin Urine: NEGATIVE
Hgb urine dipstick: NEGATIVE
Ketones, ur: NEGATIVE
Protein, ur: NEGATIVE
Urobilinogen, UA: 1

## 2011-04-13 LAB — BASIC METABOLIC PANEL
BUN: 15
BUN: 21
Calcium: 8.3 — ABNORMAL LOW
Chloride: 109
Creatinine, Ser: 0.98
GFR calc non Af Amer: >60
GFR calc non Af Amer: >60
Potassium: 3.8

## 2011-04-13 LAB — COMPREHENSIVE METABOLIC PANEL
CO2: 23
Calcium: 9.6
Creatinine, Ser: 0.72
GFR calc non Af Amer: >60
Glucose, Bld: 186 — ABNORMAL HIGH
Total Protein: 6.8

## 2011-04-13 LAB — BLOOD GAS, ARTERIAL
Acid-Base Excess: 0.5
Drawn by: 181601
O2 Saturation: 96.5
TCO2: 25.1
pCO2 arterial: 35.1
pO2, Arterial: 82.2

## 2011-04-13 LAB — CREATININE, SERUM: GFR calc non Af Amer: >60

## 2011-04-13 LAB — HEMOGLOBIN A1C
Hgb A1c MFr Bld: 9 — ABNORMAL HIGH
Mean Plasma Glucose: 243

## 2011-04-13 LAB — MAGNESIUM
Magnesium: 2.4
Magnesium: 2.5

## 2011-04-13 LAB — TYPE AND SCREEN: Antibody Screen: NEGATIVE

## 2011-04-13 LAB — PLATELET COUNT: Platelets: 218

## 2011-04-13 LAB — PROTIME-INR
INR: 1
INR: 1.4
Prothrombin Time: 13.7

## 2011-04-16 LAB — PROTIME-INR
INR: 0.9
Prothrombin Time: 12.3

## 2011-04-16 LAB — COMPREHENSIVE METABOLIC PANEL
Albumin: 4.2
Alkaline Phosphatase: 46
BUN: 17
Creatinine, Ser: 0.93
Glucose, Bld: 81
Total Bilirubin: 1
Total Protein: 7.1

## 2011-04-16 LAB — APTT: aPTT: 31

## 2011-04-16 LAB — CBC
HCT: 42.8
Hemoglobin: 14.2
MCV: 87.3
Platelets: 299
RDW: 14.5 — ABNORMAL HIGH

## 2011-05-18 ENCOUNTER — Other Ambulatory Visit: Payer: Self-pay

## 2011-06-15 ENCOUNTER — Ambulatory Visit (INDEPENDENT_AMBULATORY_CARE_PROVIDER_SITE_OTHER): Payer: 59 | Admitting: *Deleted

## 2011-06-15 ENCOUNTER — Other Ambulatory Visit (INDEPENDENT_AMBULATORY_CARE_PROVIDER_SITE_OTHER): Payer: 59 | Admitting: *Deleted

## 2011-06-15 DIAGNOSIS — Z48812 Encounter for surgical aftercare following surgery on the circulatory system: Secondary | ICD-10-CM

## 2011-06-15 DIAGNOSIS — I739 Peripheral vascular disease, unspecified: Secondary | ICD-10-CM

## 2011-06-22 NOTE — Procedures (Unsigned)
BYPASS GRAFT EVALUATION  INDICATION:  Follow up right fem-pop graft and left SFA stent.  HISTORY: Diabetes:  Yes. Cardiac:  Yes. Hypertension:  Yes. Smoking:  Previous. Previous Surgery:  Bilateral lower extremity intervention, 01/24/2007.  SINGLE LEVEL ARTERIAL EXAM                              RIGHT              LEFT Brachial: Anterior tibial: Posterior tibial: Peroneal: Ankle/brachial index:        0.87               0.83  PREVIOUS ABI:  Date: 05/12/10  RIGHT:  0.95  LEFT:  0.91  LOWER EXTREMITY BYPASS GRAFT DUPLEX EXAM:  DUPLEX: 1. Widely patent right fem-pop graft without evidence of restenosis. 2. Patent left superficial femoral artery stent with mild-to-moderate     diffuse plaquing within the stent and in the native outflow     superficial femoral artery with a focal stenosis observed in the     Hunter's canal segment. 3. See diagram for details.  IMPRESSION:  Patent bilateral lower extremity revascularizations, as described above.  ___________________________________________ William Hertz, MD  LT/MEDQ  D:  06/15/2011  T:  06/15/2011  Job:  [id number]

## 2011-07-03 DIAGNOSIS — I480 Paroxysmal atrial fibrillation: Secondary | ICD-10-CM

## 2011-07-03 HISTORY — DX: Paroxysmal atrial fibrillation: I48.0

## 2011-07-25 ENCOUNTER — Encounter: Payer: Self-pay | Admitting: Vascular Surgery

## 2011-07-26 ENCOUNTER — Encounter: Payer: Self-pay | Admitting: Vascular Surgery

## 2011-07-27 ENCOUNTER — Encounter: Payer: Self-pay | Admitting: Vascular Surgery

## 2011-07-27 ENCOUNTER — Ambulatory Visit (INDEPENDENT_AMBULATORY_CARE_PROVIDER_SITE_OTHER): Payer: 59 | Admitting: Vascular Surgery

## 2011-07-27 VITALS — BP 170/90 | HR 91 | Resp 16 | Ht 71.0 in | Wt 222.0 lb

## 2011-07-27 DIAGNOSIS — I70219 Atherosclerosis of native arteries of extremities with intermittent claudication, unspecified extremity: Secondary | ICD-10-CM

## 2011-07-27 NOTE — Progress Notes (Signed)
[facility] [place]  Established Intermittent Claudication  History of Present Illness  William Pacheco is a 61 y.o. male who presents with chief complaint: follow up on abnormal vascular lab studies.  The patient's symptoms have not progressed.  The patient's symptoms are: none.   The patient's treatment regimen currently included: maximal medical management.  The patient notes continuing to be smoke free.  Past Medical History  Diagnosis Date  . Arthritis   . Peripheral vascular disease   . Adopted     Pt has limited medical knowledge of family history  . Diabetes mellitus   . History of hepatitis B 1980's  . ED (erectile dysfunction)   . Renal artery stenosis   . Peripheral arterial disease   . Hypertension   . Hyperlipidemia   . CAD (coronary artery disease)     Past Surgical History  Procedure Date  . Coronary artery bypass graft 2008     X 6 vessel  . Femoral endarterectomy 03-15-2008    Left EIA & CFA endarterectomy by Dr. Madilyn Fireman  . Knee arthroscopy     Right  . Tonsillectomy     History   Social History  . Marital Status: Married    Spouse Name: N/A    Number of Children: N/A  . Years of Education: N/A   Occupational History  . Not on file.   Social History Main Topics  . Smoking status: Former Smoker    Quit date: 07/03/2003  . Smokeless tobacco: Not on file  . Alcohol Use: No  . Drug Use: No  . Sexually Active:    Other Topics Concern  . Not on file   Social History Narrative  . No narrative on file    No family history on file.  Current Outpatient Prescriptions on File Prior to Visit  Medication Sig Dispense Refill  . aspirin 325 MG tablet Take 325 mg by mouth daily.      . carvedilol (COREG) 25 MG tablet Take 25 mg by mouth 2 (two) times daily with a meal.      . cilostazol (PLETAL) 100 MG tablet Take 100 mg by mouth 2 (two) times daily.      . clopidogrel (PLAVIX) 75 MG tablet Take 75 mg by mouth daily.      Marland Kitchen  glimepiride (AMARYL) 4 MG tablet Take 4 mg by mouth daily before breakfast.      . insulin glargine (LANTUS) 100 UNIT/ML injection Inject 45 Units into the skin at bedtime.      . pioglitazone-metformin (ACTOPLUS MET) 15-850 MG per tablet Take 1 tablet by mouth 2 (two) times daily with a meal.      . ramipril (ALTACE) 10 MG capsule Take 10 mg by mouth daily.      . simvastatin (ZOCOR) 80 MG tablet Take 80 mg by mouth at bedtime.        No Known Allergies  Review of Systems (Positive items checked otherwise negative)  General: [ ]  Weight loss, [ ]  Weight gain, [ ]   Loss of appetite, [ ]  Fever  Neurologic: [ ]  Dizziness, [ ]  Blackouts, [ ]  Headaches, [ ]  Seizure  Ear/Nose/Throat: [ ]  Change in eyesight, [ ]  Change in hearing, [ ]  Nose bleeds, [ ]  Sore throat  Vascular: [ ]  Pain in legs with walking, [ ]  Pain in feet while lying flat, [ ]  Non-healing ulcer, [ ]  Stroke, [ ]  "Mini stroke", [ ]  Slurred speech, [ ]   Temporary blindness, [ ]  Blood clot in vein, [ ]  Phlebitis  Pulmonary: [ ]  Home oxygen, [ ]  Productive cough, [ ]  Bronchitis, [ ]  Coughing up blood, [ ]  Asthma, [ ]  Wheezing  Musculoskeletal: [ ]  Arthritis, [ ]  Joint pain, [ ]  Muscle pain  Cardiac: [ ]  Chest pain, [ ]  Chest tightness/pressure, [ ]  Shortness of breath when lying flat, [ ]  Shortness of breath with exertion, [ ]  Palpitations, [ ]  Heart murmur, [ ]  Arrythmia,  [ ]  Atrial fibrillation  Hematologic: [ ]  Bleeding problems, [ ]  Clotting disorder, [ ]  Anemia  Psychiatric:  [ ]  Depression, [ ]  Anxiety, [ ]  Attention deficit disorder  Gastrointestinal:  [ ]  Black stool,[ ]   Blood in stool, [ ]  Peptic ulcer disease, [ ]  Reflux, [ ]  Hiatal hernia, [ ]  Trouble swallowing, [ ]  Diarrhea, [ ]  Constipation  Urinary:  [ ]  Kidney disease, [ ]  Burning with urination, [ ]  Frequent urination, [ ]  Difficulty urinating  Skin: [ ]  Ulcers, [ ]  Rashes  Physical Examination  Filed Vitals:   07/27/11 0842  BP: 170/90  Pulse: 91    Resp: 16  Height: 5\' 11"  (1.803 m)  Weight: 222 lb (100.699 kg)  SpO2: 98%    General: A&O x 3, WDWN  Pulmonary: Sym exp, good air movt, CTAB, no rales, rhonchi, & wheezing  Cardiac: RRR, Nl S1, S2, no Murmurs, rubs or gallops  Vascular: Vessel Right Left  Radial Palpable Palpable  Brachial Palpable Palpable  Carotid Palpable, without bruit Palpable, without bruit  Aorta Non-palpable N/A  Femoral Palpable Palpable  Popliteal Non-palpable Non-palpable  PT Palpable Palpable  DP Palpable Palpable   Musculoskeletal: M/S 5/5 throughout , Extremities without ischemic changes , multiple healed incisions on right leg  Neurologic: Pain and light touch intact in extremities , Motor exam as listed above  Non-Invasive Vascular Imaging ABI (Date: 07/27/11)  RLE: 0.87, PT: triphasic, DP: monophasic  LLE: 0.83, PT: Monophasic, DP: Triphasic  B leg arterial Duplex (Date: 07/27/11)  R: patent R fem-pop: PSV 162 c/s near proximal anastomosi  L:  Patent  SFA stent with PSV 337 c/s  Medical Decision Making  William Pacheco is a 61 y.o. male who presents with: resolved B leg intermittent claudication with L SFA stent stenosis  On both sides, the patient's ABI have decreased, suggesting possibly some increased aortoiliac disease  Based on the patient's vascular studies and examination, I have offered the patient: Aortogram, Left leg angiogram and possible intervention.  He likely will need a covered stent to prevent distal embolization to treat the area of stenosis.  The patient is going to let us known what date works best for him and we will schedule him subsequently. I discussed with the patient the nature of angiographic procedures, especially the limited patencies of any endovascular intervention.  The patient is aware of that the risks of an angiographic procedure include but are not limited to: bleeding, infection, access site complications, renal failure, embolization, rupture of  vessel, dissection, possible need for emergent surgical intervention, possible need for surgical procedures to treat the patient's pathology, and stroke and death.  The patient is aware of the risks and agrees to proceed.  I discussed in depth with the patient the nature of atherosclerosis, and emphasized the importance of maximal medical management including strict control of blood pressure, blood glucose, and lipid levels, obtaining regular exercise, and cessation of smoking.  The patient is aware that without maximal medical management  the underlying atherosclerotic disease process will progress, limiting the benefit of any interventions.  Thank you for allowing [place] to participate in this patient's care.  [name], MD [facility] [place] Office: [phone] Pager: [phone]

## 2011-08-15 ENCOUNTER — Other Ambulatory Visit: Payer: Self-pay

## 2011-08-23 ENCOUNTER — Encounter (HOSPITAL_COMMUNITY): Payer: Self-pay | Admitting: Pharmacy Technician

## 2011-08-29 MED ORDER — SODIUM CHLORIDE 0.9 % IV SOLN: INTRAVENOUS | Status: DC

## 2011-08-30 ENCOUNTER — Other Ambulatory Visit: Payer: Self-pay

## 2011-08-30 ENCOUNTER — Ambulatory Visit (HOSPITAL_COMMUNITY)
Admission: RE | Admit: 2011-08-30 | Discharge: 2011-08-30 | Disposition: A | Discharge status: Home / Self Care | Payer: 59 | Source: Ambulatory Visit | Attending: Vascular Surgery | Admitting: Vascular Surgery

## 2011-08-30 ENCOUNTER — Encounter (HOSPITAL_COMMUNITY)
Admission: RE | Disposition: A | Discharge status: Home / Self Care | Payer: Self-pay | Source: Ambulatory Visit | Attending: Vascular Surgery

## 2011-08-30 DIAGNOSIS — I701 Atherosclerosis of renal artery: Secondary | ICD-10-CM | POA: Insufficient documentation

## 2011-08-30 DIAGNOSIS — T82898A Other specified complication of vascular prosthetic devices, implants and grafts, initial encounter: Secondary | ICD-10-CM | POA: Insufficient documentation

## 2011-08-30 DIAGNOSIS — E119 Type 2 diabetes mellitus without complications: Secondary | ICD-10-CM | POA: Insufficient documentation

## 2011-08-30 DIAGNOSIS — I10 Essential (primary) hypertension: Secondary | ICD-10-CM | POA: Insufficient documentation

## 2011-08-30 DIAGNOSIS — I2510 Atherosclerotic heart disease of native coronary artery without angina pectoris: Secondary | ICD-10-CM | POA: Insufficient documentation

## 2011-08-30 DIAGNOSIS — I70209 Unspecified atherosclerosis of native arteries of extremities, unspecified extremity: Secondary | ICD-10-CM | POA: Insufficient documentation

## 2011-08-30 DIAGNOSIS — N529 Male erectile dysfunction, unspecified: Secondary | ICD-10-CM | POA: Insufficient documentation

## 2011-08-30 DIAGNOSIS — E785 Hyperlipidemia, unspecified: Secondary | ICD-10-CM | POA: Insufficient documentation

## 2011-08-30 DIAGNOSIS — Y831 Surgical operation with implant of artificial internal device as the cause of abnormal reaction of the patient, or of later complication, without mention of misadventure at the time of the procedure: Secondary | ICD-10-CM | POA: Insufficient documentation

## 2011-08-30 DIAGNOSIS — I70219 Atherosclerosis of native arteries of extremities with intermittent claudication, unspecified extremity: Secondary | ICD-10-CM

## 2011-08-30 HISTORY — PX: ABDOMINAL AORTAGRAM: SHX5454

## 2011-08-30 HISTORY — PX: PERCUTANEOUS STENT INTERVENTION: SHX5500

## 2011-08-30 LAB — POCT I-STAT, CHEM 8
Calcium, Ion: 1.21 mmol/L (ref 1.12–1.32)
Chloride: 105 mEq/L (ref 96–112)
Glucose, Bld: 216 mg/dL — ABNORMAL HIGH (ref 70–99)
HCT: 46 % (ref 39.0–52.0)
Hemoglobin: 15.6 g/dL (ref 13.0–17.0)
Potassium: 4.1 mEq/L (ref 3.5–5.1)

## 2011-08-30 SURGERY — ABDOMINAL AORTAGRAM
Anesthesia: LOCAL

## 2011-08-30 MED ORDER — HEPARIN (PORCINE) IN NACL 2-0.9 UNIT/ML-% IJ SOLN
INTRAMUSCULAR | Status: AC
  Filled 2011-08-30: qty 1000

## 2011-08-30 MED ORDER — CARVEDILOL 25 MG PO TABS
37.5000 mg | ORAL_TABLET | ORAL | Status: AC
  Administered 2011-08-30: 37.5 mg via ORAL
  Filled 2011-08-30: qty 1

## 2011-08-30 MED ORDER — HYDRALAZINE HCL 20 MG/ML IJ SOLN: 10.0000 mg | INTRAMUSCULAR | Status: DC | PRN

## 2011-08-30 MED ORDER — HEPARIN SODIUM (PORCINE) 1000 UNIT/ML IJ SOLN
INTRAMUSCULAR | Status: AC
  Filled 2011-08-30: qty 1

## 2011-08-30 MED ORDER — SODIUM CHLORIDE 0.9 % IV SOLN: 1.0000 mL/kg/h | INTRAVENOUS | Status: DC

## 2011-08-30 MED ORDER — CLOPIDOGREL BISULFATE 75 MG PO TABS: 75.0000 mg | ORAL_TABLET | Freq: Every day | ORAL | Status: DC

## 2011-08-30 MED ORDER — AMLODIPINE BESYLATE 10 MG PO TABS
10.0000 mg | ORAL_TABLET | ORAL | Status: AC
  Administered 2011-08-30: 10 mg via ORAL
  Filled 2011-08-30: qty 1

## 2011-08-30 MED ORDER — FENTANYL CITRATE 0.05 MG/ML IJ SOLN
INTRAMUSCULAR | Status: AC
  Filled 2011-08-30: qty 2

## 2011-08-30 MED ORDER — LABETALOL HCL 5 MG/ML IV SOLN: 10.0000 mg | INTRAVENOUS | Status: DC | PRN

## 2011-08-30 MED ORDER — HYDRALAZINE HCL 20 MG/ML IJ SOLN
10.0000 mg | Freq: Once | INTRAMUSCULAR | Status: AC
  Administered 2011-08-30: 10 mg via INTRAVENOUS

## 2011-08-30 MED ORDER — LIDOCAINE HCL (PF) 1 % IJ SOLN
INTRAMUSCULAR | Status: AC
  Filled 2011-08-30: qty 30

## 2011-08-30 MED ORDER — HYDRALAZINE HCL 20 MG/ML IJ SOLN
INTRAMUSCULAR | Status: AC
  Filled 2011-08-30: qty 1

## 2011-08-30 MED ORDER — MIDAZOLAM HCL 2 MG/2ML IJ SOLN
INTRAMUSCULAR | Status: AC
  Filled 2011-08-30: qty 2

## 2011-08-30 MED ORDER — RAMIPRIL 10 MG PO CAPS
10.0000 mg | ORAL_CAPSULE | ORAL | Status: AC
  Administered 2011-08-30: 10 mg via ORAL
  Filled 2011-08-30: qty 1

## 2011-08-30 NOTE — Discharge Instructions (Signed)

## 2011-08-30 NOTE — H&P (Addendum)
[facility] [place]  History and Physical  History of Present Illness  William Pacheco is a 61 y.o. male who presents with chief complaint: increasing PAD in both legs.  The patient presents today for Aortogram, Left leg angiogram and possible intervention.    Past Medical History  Diagnosis Date  . Arthritis   . Peripheral vascular disease   . Adopted     Pt has limited medical knowledge of family history  . Diabetes mellitus   . History of hepatitis B 1980's  . ED (erectile dysfunction)   . Renal artery stenosis   . Peripheral arterial disease   . Hypertension   . Hyperlipidemia   . CAD (coronary artery disease)     Past Surgical History  Procedure Date  . Coronary artery bypass graft 2008     X 6 vessel  . Femoral endarterectomy 03-15-2008    Left EIA & CFA endarterectomy by Dr. Madilyn Fireman  . Knee arthroscopy     Right  . Tonsillectomy     History   Social History  . Marital Status: Married    Spouse Name: N/A    Number of Children: N/A  . Years of Education: N/A   Occupational History  . Not on file.   Social History Main Topics  . Smoking status: Former Smoker    Quit date: 07/03/2003  . Smokeless tobacco: Not on file  . Alcohol Use: No  . Drug Use: No  . Sexually Active:    Other Topics Concern  . Not on file   Social History Narrative  . No narrative on file    No family history on file.  No current facility-administered medications on file prior to encounter.   Current Outpatient Prescriptions on File Prior to Encounter  Medication Sig Dispense Refill  . aspirin 325 MG tablet Take 325 mg by mouth daily.      . carvedilol (COREG) 25 MG tablet Take 37.5 mg by mouth 2 (two) times daily with a meal.       . cilostazol (PLETAL) 100 MG tablet Take 100 mg by mouth 2 (two) times daily.      . clopidogrel (PLAVIX) 75 MG tablet Take 75 mg by mouth daily.      . insulin glargine (LANTUS) 100 UNIT/ML injection Inject 45 Units into  the skin daily.       . pioglitazone-metformin (ACTOPLUS MET) 15-850 MG per tablet Take 1 tablet by mouth 2 (two) times daily with a meal.      . ramipril (ALTACE) 10 MG capsule Take 10 mg by mouth daily.        No Known Allergies  Review of Systems: Kidney Disease, As listed above, otherwise negative.  Physical Examination  Filed Vitals:   08/30/11 0821  BP: 160/90  Pulse: 68  Temp: 97.4 F (36.3 C)  Resp: 18  Height: 6' (1.829 m)  Weight: 206 lb (93.441 kg)  SpO2: 94%   Body mass index is 27.94 kg/(m^2).  General: A&O x 3, WDWN  Pulmonary: Sym exp, good air movt, CTAB, no rales, rhonchi, & wheezing  Cardiac: RRR, Nl S1, S2, no Murmurs, rubs or gallops  Gastrointestinal: soft, NTND, -G/R, - HSM, - masses, - CVAT B  Musculoskeletal: M/S 5/5 throughout , Extremities without ischemic changes   Laboratory See iStat  Medical Decision Making  William Pacheco is a 61 y.o. male who presents with: increasing BLE PAD.   The patient is scheduled for:  Aortogram, Left leg angiogram and possible intervention I discussed with the patient the nature of angiographic procedures, especially the limited patencies of any endovascular intervention.  The patient is aware of that the risks of an angiographic procedure include but are not limited to: bleeding, infection, access site complications, renal failure, embolization, rupture of vessel, dissection, possible need for emergent surgical intervention, possible need for surgical procedures to treat the patient's pathology, and stroke and death.    The patient is aware of the risks and agrees to proceed.  [name], MD [facility] [place] Office: [phone] Pager: [phone]  08/30/2011, 7:13 AM

## 2011-08-30 NOTE — Op Note (Signed)
OPERATIVE NOTE   PROCEDURE: 1.  Right common femoral artery cannulation under ultrasound guidance 2.  Aortogram 3.  Left leg angiogram 4.  Stenting and angioplasty of left distal superficial femoral artery   PRE-OPERATIVE DIAGNOSIS: Left superficial femoral artery In-stent stenosis  POST-OPERATIVE DIAGNOSIS: same as above   SURGEON: [name], MD  ANESTHESIA: conscious sedation  ESTIMATED BLOOD LOSS: 50 cc  CONTRAST: 175 cc  FINDING(S):  Aorta: Patent  Bilateral common, internal, and external iliac arteries patent Bilateral common femoral artery patent Diseased right profunda femoral artery  Patent right superficial femoral artery Patent left profunda and superficial femoral arteries <50% stenosis in mid-segment superficial femoral artery  Multiple >75% stenoses in above-the-knee including a ~90% stenosis: resolved after stenting and angioplasty with 5 mm x 60 mm stent Aberrant left anterior tibial artery takeoff near the knee joint: widely patent, dominant runoff to left foot Left lateral geniculate supplies collaterals that run down to the foot No evidence of embolization after stenting  SPECIMEN(S):  none  INDICATIONS:   William Pacheco is a 61 y.o. male who presents with increasing velocities in the left superficial femoral artery on surveillance.  The duplex ultrasound suggests in-stent restenosis.  I recommended intervention in a primary patency fashion.  I discussed with the patient the nature of angiographic procedures, especially the limited patencies of any endovascular intervention.  The patient is aware of that the risks of an angiographic procedure include but are not limited to: bleeding, infection, access site complications, renal failure, embolization, rupture of vessel, dissection, possible need for emergent surgical intervention, possible need for surgical procedures to treat the patient's pathology, and stroke and death.  The patient is aware of the risks  and agrees to proceed.  DESCRIPTION: After full informed consent was obtained from the patient, the patient was brought back to the angiography suite.  The patient was placed supine upon the angiography table and connected to monitoring equipment.  The patient was then given conscious sedation, the amounts of which are documented in the patient's chart.  The patient was prepped and drape in the standard fashion for an angiographic procedure.  At this point, attention was turned to the right groin.  Under ultrasound guidance, the right common femoral artery will be cannulated with a 18 gauge needle.  The [facility] wire was passed up into the aorta.  The needle was exchanged for an endhole catheter which passed over the wire into the aorta.  The wire was exchanged for an Amplatz wire.   The end hole catheter was exchanged for a 5-Fr sheath, which was advanced over the wire into the common femoral artery.  The dilator was then removed.  The Omniflush catheter was then loaded over the wire up to the level of L1.  The catheter was connected to the power injector circuit.  After de-airring and de-clotting the circuit, a power injector aortogram was completed.  The [facility] wire was replaced in the catheter, and using the McIntosh and Omniflush, the left common iliac artery was selected.  The catheter and wire were advanced into the external iliac artery.  The wire was removed and the catheter was connected to the power injector circuit.  After de-airring and de-clotting the circuit, an automated left leg runoff was completed.  Based on the images, the left superficial femoral artery stents are patent and while there is a 50% stenosis in the mid segment, it does not need immediate intervention.  The distal superficial femoral artery/proximal above-the-knee popliteal artery  has multiple stenoses including a 90% stenosis.  The wire was exchanged for an Amplatz wire.  The patient's right femoral sheath was exchanged for a 6-Fr  Destination sheath, which was lodged in the left external iliac artery.  The patient was given 8000 units of Heparin intravenously to obtain anticoagulation.  The dilator was removed.  Based on the size of artery, a 5 mm x 60 mm Abbott self-expanding stent was selected.  Using a Glidecath and Navicross wire, I crossed the distal stenoses and removed the catheter.  The stent was loaded over the wire and centered at the location of the stenoses.  It was deployed without any difficulties and the stent delivery device was removed.  I then post-dilated the stent with a 5 mm x 60 mm angioplasty balloon which was inflated to profile to fully appose the stent to the arterial wall.  The angioplasty balloon was removed.  Completion imaging demonstrates resolution of the stenoses and no evidence of distal embolization.  The wire and sheath were pulled back into the right external iliac artery.  The plan is remove this sheath in the holding area once anticoagulation has reversed.    COMPLICATIONS: none  CONDITION: stable   [name], MD [facility] [place] Office: [phone] Pager: [phone]  08/30/2011, 9:53 AM

## 2011-09-24 ENCOUNTER — Emergency Department (HOSPITAL_COMMUNITY): Payer: 59

## 2011-09-24 ENCOUNTER — Encounter (HOSPITAL_COMMUNITY): Payer: Self-pay | Admitting: Emergency Medicine

## 2011-09-24 ENCOUNTER — Inpatient Hospital Stay (HOSPITAL_COMMUNITY)
Admission: EM | Admit: 2011-09-24 | Discharge: 2011-09-25 | DRG: 309 | Disposition: A | Discharge status: Home / Self Care | Payer: 59 | Source: Ambulatory Visit | Attending: Internal Medicine | Admitting: Internal Medicine

## 2011-09-24 ENCOUNTER — Other Ambulatory Visit: Payer: Self-pay

## 2011-09-24 DIAGNOSIS — E1140 Type 2 diabetes mellitus with diabetic neuropathy, unspecified: Secondary | ICD-10-CM | POA: Diagnosis present

## 2011-09-24 DIAGNOSIS — I701 Atherosclerosis of renal artery: Secondary | ICD-10-CM | POA: Diagnosis present

## 2011-09-24 DIAGNOSIS — J90 Pleural effusion, not elsewhere classified: Secondary | ICD-10-CM | POA: Diagnosis present

## 2011-09-24 DIAGNOSIS — Z7901 Long term (current) use of anticoagulants: Secondary | ICD-10-CM

## 2011-09-24 DIAGNOSIS — J811 Chronic pulmonary edema: Secondary | ICD-10-CM

## 2011-09-24 DIAGNOSIS — I739 Peripheral vascular disease, unspecified: Secondary | ICD-10-CM

## 2011-09-24 DIAGNOSIS — E785 Hyperlipidemia, unspecified: Secondary | ICD-10-CM | POA: Diagnosis present

## 2011-09-24 DIAGNOSIS — I10 Essential (primary) hypertension: Secondary | ICD-10-CM

## 2011-09-24 DIAGNOSIS — I509 Heart failure, unspecified: Secondary | ICD-10-CM | POA: Diagnosis present

## 2011-09-24 DIAGNOSIS — Z794 Long term (current) use of insulin: Secondary | ICD-10-CM

## 2011-09-24 DIAGNOSIS — Z7982 Long term (current) use of aspirin: Secondary | ICD-10-CM

## 2011-09-24 DIAGNOSIS — I2510 Atherosclerotic heart disease of native coronary artery without angina pectoris: Secondary | ICD-10-CM

## 2011-09-24 DIAGNOSIS — E119 Type 2 diabetes mellitus without complications: Secondary | ICD-10-CM | POA: Diagnosis present

## 2011-09-24 DIAGNOSIS — I70219 Atherosclerosis of native arteries of extremities with intermittent claudication, unspecified extremity: Secondary | ICD-10-CM | POA: Diagnosis present

## 2011-09-24 DIAGNOSIS — M129 Arthropathy, unspecified: Secondary | ICD-10-CM | POA: Diagnosis present

## 2011-09-24 DIAGNOSIS — Z951 Presence of aortocoronary bypass graft: Secondary | ICD-10-CM

## 2011-09-24 DIAGNOSIS — Z79899 Other long term (current) drug therapy: Secondary | ICD-10-CM

## 2011-09-24 DIAGNOSIS — I4891 Unspecified atrial fibrillation: Principal | ICD-10-CM | POA: Diagnosis present

## 2011-09-24 DIAGNOSIS — D696 Thrombocytopenia, unspecified: Secondary | ICD-10-CM | POA: Diagnosis present

## 2011-09-24 LAB — CBC
HCT: 42.9 % (ref 39.0–52.0)
Hemoglobin: 14.2 g/dL (ref 13.0–17.0)
MCH: 28.9 pg (ref 26.0–34.0)
MCHC: 33.1 g/dL (ref 30.0–36.0)

## 2011-09-24 LAB — COMPREHENSIVE METABOLIC PANEL
AST: 35 U/L (ref 0–37)
Albumin: 3.3 g/dL — ABNORMAL LOW (ref 3.5–5.2)
Alkaline Phosphatase: 48 U/L (ref 39–117)
BUN: 12 mg/dL (ref 6–23)
Chloride: 104 mEq/L (ref 96–112)
Potassium: 4.7 mEq/L (ref 3.5–5.1)
Sodium: 136 mEq/L (ref 135–145)
Total Protein: 5.9 g/dL — ABNORMAL LOW (ref 6.0–8.3)

## 2011-09-24 LAB — CARDIAC PANEL(CRET KIN+CKTOT+MB+TROPI)
CK, MB: 5.7 ng/mL — ABNORMAL HIGH (ref 0.3–4.0)
Relative Index: 2.9 — ABNORMAL HIGH (ref 0.0–2.5)
Relative Index: 2.9 — ABNORMAL HIGH (ref 0.0–2.5)
Relative Index: 2.7 — ABNORMAL HIGH (ref 0.0–2.5)
Total CK: 223 U/L (ref 7–232)
Total CK: 200 U/L (ref 7–232)

## 2011-09-24 LAB — BASIC METABOLIC PANEL
BUN: 12 mg/dL (ref 6–23)
CO2: 26 mEq/L (ref 19–32)
Calcium: 8.9 mg/dL (ref 8.4–10.5)
GFR calc non Af Amer: >90 mL/min (ref >90)
Glucose, Bld: 306 mg/dL — ABNORMAL HIGH (ref 70–99)

## 2011-09-24 LAB — TROPONIN I: Troponin I: <0.3 ng/mL (ref <0.30)

## 2011-09-24 LAB — POCT I-STAT, CHEM 8
BUN: 14 mg/dL (ref 6–23)
Calcium, Ion: 1.22 mmol/L (ref 1.12–1.32)
Chloride: 104 mEq/L (ref 96–112)
Glucose, Bld: 306 mg/dL — ABNORMAL HIGH (ref 70–99)
Potassium: 4.6 mEq/L (ref 3.5–5.1)

## 2011-09-24 LAB — RAPID URINE DRUG SCREEN, HOSP PERFORMED
Amphetamines: NOT DETECTED
Benzodiazepines: NOT DETECTED
Opiates: NOT DETECTED
Tetrahydrocannabinol: NOT DETECTED

## 2011-09-24 LAB — POCT I-STAT TROPONIN I

## 2011-09-24 LAB — GLUCOSE, CAPILLARY: Glucose-Capillary: 354 mg/dL — ABNORMAL HIGH (ref 70–99)

## 2011-09-24 MED ORDER — SODIUM CHLORIDE 0.9 % IV SOLN
250.0000 mL | INTRAVENOUS | Status: DC | PRN
  Administered 2011-09-24 – 2011-09-25 (×2): 250 mL via INTRAVENOUS

## 2011-09-24 MED ORDER — ACETAMINOPHEN 325 MG PO TABS: 650.0000 mg | ORAL_TABLET | ORAL | Status: DC | PRN

## 2011-09-24 MED ORDER — CARVEDILOL 12.5 MG PO TABS
37.5000 mg | ORAL_TABLET | Freq: Two times a day (BID) | ORAL | Status: DC
  Administered 2011-09-24 – 2011-09-25 (×3): 37.5 mg via ORAL
  Filled 2011-09-24 (×4): qty 1

## 2011-09-24 MED ORDER — RAMIPRIL 10 MG PO CAPS
10.0000 mg | ORAL_CAPSULE | Freq: Every day | ORAL | Status: DC
  Administered 2011-09-24 – 2011-09-25 (×2): 10 mg via ORAL
  Filled 2011-09-24 (×2): qty 1

## 2011-09-24 MED ORDER — ALBUTEROL SULFATE (5 MG/ML) 0.5% IN NEBU
5.0000 mg | INHALATION_SOLUTION | Freq: Once | RESPIRATORY_TRACT | Status: AC
  Administered 2011-09-24: 5 mg via RESPIRATORY_TRACT
  Filled 2011-09-24: qty 1

## 2011-09-24 MED ORDER — SODIUM CHLORIDE 0.9 % IV SOLN
INTRAVENOUS | Status: DC
  Administered 2011-09-24: 07:00:00 via INTRAVENOUS

## 2011-09-24 MED ORDER — INSULIN ASPART 100 UNIT/ML ~~LOC~~ SOLN
0.0000 [IU] | Freq: Three times a day (TID) | SUBCUTANEOUS | Status: DC
  Administered 2011-09-24: 9 [IU] via SUBCUTANEOUS
  Administered 2011-09-24: 5 [IU] via SUBCUTANEOUS
  Administered 2011-09-25: 3 [IU] via SUBCUTANEOUS
  Administered 2011-09-25: 2 [IU] via SUBCUTANEOUS
  Administered 2011-09-25: 3 [IU] via SUBCUTANEOUS

## 2011-09-24 MED ORDER — ONDANSETRON HCL 4 MG/2ML IJ SOLN: 4.0000 mg | Freq: Four times a day (QID) | INTRAMUSCULAR | Status: DC | PRN

## 2011-09-24 MED ORDER — SODIUM CHLORIDE 0.9 % IJ SOLN: 3.0000 mL | INTRAMUSCULAR | Status: DC | PRN

## 2011-09-24 MED ORDER — FUROSEMIDE 10 MG/ML IJ SOLN: 40.0000 mg | Freq: Two times a day (BID) | INTRAMUSCULAR | Status: DC

## 2011-09-24 MED ORDER — ASPIRIN 325 MG PO TABS
325.0000 mg | ORAL_TABLET | Freq: Every day | ORAL | Status: DC
  Administered 2011-09-24 – 2011-09-25 (×2): 325 mg via ORAL
  Filled 2011-09-24 (×2): qty 1

## 2011-09-24 MED ORDER — IOHEXOL 350 MG/ML SOLN
100.0000 mL | Freq: Once | INTRAVENOUS | Status: AC | PRN
  Administered 2011-09-24: 100 mL via INTRAVENOUS

## 2011-09-24 MED ORDER — VANCOMYCIN HCL IN DEXTROSE 1-5 GM/200ML-% IV SOLN: 1000.0000 mg | Freq: Once | INTRAVENOUS | Status: DC

## 2011-09-24 MED ORDER — PIPERACILLIN-TAZOBACTAM 3.375 G IVPB
3.3750 g | Freq: Once | INTRAVENOUS | Status: DC
  Administered 2011-09-24: 3.375 g via INTRAVENOUS
  Filled 2011-09-24: qty 50

## 2011-09-24 MED ORDER — POTASSIUM CHLORIDE CRYS ER 10 MEQ PO TBCR
10.0000 meq | EXTENDED_RELEASE_TABLET | Freq: Two times a day (BID) | ORAL | Status: DC
  Administered 2011-09-24 – 2011-09-25 (×2): 10 meq via ORAL
  Filled 2011-09-24 (×3): qty 1

## 2011-09-24 MED ORDER — INSULIN GLARGINE 100 UNIT/ML ~~LOC~~ SOLN
45.0000 [IU] | Freq: Every day | SUBCUTANEOUS | Status: DC
  Administered 2011-09-24 – 2011-09-25 (×2): 45 [IU] via SUBCUTANEOUS

## 2011-09-24 MED ORDER — DILTIAZEM HCL 100 MG IV SOLR
10.0000 mg | Freq: Once | INTRAVENOUS | Status: AC
  Administered 2011-09-24: 12:00:00 via INTRAVENOUS
  Filled 2011-09-24: qty 100

## 2011-09-24 MED ORDER — CILOSTAZOL 100 MG PO TABS
100.0000 mg | ORAL_TABLET | Freq: Two times a day (BID) | ORAL | Status: DC
  Administered 2011-09-24 – 2011-09-25 (×3): 100 mg via ORAL
  Filled 2011-09-24 (×4): qty 1

## 2011-09-24 MED ORDER — SIMVASTATIN 40 MG PO TABS
40.0000 mg | ORAL_TABLET | Freq: Every evening | ORAL | Status: DC
  Administered 2011-09-24: 40 mg via ORAL
  Filled 2011-09-24 (×2): qty 1

## 2011-09-24 MED ORDER — ENOXAPARIN SODIUM 40 MG/0.4ML ~~LOC~~ SOLN
40.0000 mg | SUBCUTANEOUS | Status: DC
  Administered 2011-09-24: 40 mg via SUBCUTANEOUS
  Filled 2011-09-24 (×2): qty 0.4

## 2011-09-24 MED ORDER — FUROSEMIDE 20 MG PO TABS
20.0000 mg | ORAL_TABLET | Freq: Every day | ORAL | Status: DC
  Administered 2011-09-25: 20 mg via ORAL
  Filled 2011-09-24 (×2): qty 1

## 2011-09-24 MED ORDER — DILTIAZEM HCL 100 MG IV SOLR
5.0000 mg/h | INTRAVENOUS | Status: DC
  Administered 2011-09-24 – 2011-09-25 (×3): 5 mg/h via INTRAVENOUS
  Filled 2011-09-24 (×3): qty 100

## 2011-09-24 MED ORDER — FUROSEMIDE 10 MG/ML IJ SOLN
40.0000 mg | Freq: Once | INTRAMUSCULAR | Status: AC
  Administered 2011-09-24: 40 mg via INTRAVENOUS
  Filled 2011-09-24: qty 4

## 2011-09-24 MED ORDER — CLOPIDOGREL BISULFATE 75 MG PO TABS
75.0000 mg | ORAL_TABLET | Freq: Every day | ORAL | Status: DC
  Administered 2011-09-24 – 2011-09-25 (×2): 75 mg via ORAL
  Filled 2011-09-24 (×2): qty 1

## 2011-09-24 MED ORDER — IPRATROPIUM BROMIDE 0.02 % IN SOLN
0.5000 mg | Freq: Once | RESPIRATORY_TRACT | Status: AC
  Administered 2011-09-24: 0.5 mg via RESPIRATORY_TRACT
  Filled 2011-09-24: qty 2.5

## 2011-09-24 MED ORDER — SODIUM CHLORIDE 0.9 % IJ SOLN
3.0000 mL | Freq: Two times a day (BID) | INTRAMUSCULAR | Status: DC
  Administered 2011-09-24 (×2): 3 mL via INTRAVENOUS

## 2011-09-24 NOTE — ED Notes (Signed)
Pt and wife states that the last time he was here he ended up getting a rash on his back and buttocks from the cotton sheets and the nurse mentioned using the newer sheets for him.

## 2011-09-24 NOTE — Consult Note (Signed)
Pt. Seen and examined. Agree with the NP/PA-C note as written.  This appears to be new onset heart failure, likely secondary to occult a-fib with RVR - HR's here in the 140-150 range. Now on diltiazem.  Last EF was 45% with some regional wall motion abnormalities. 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 Echo is pending today. CXR shows bilateral pleural effusions. He may need TEE cardioversion.  Chrystie Nose, MD, [facility] Attending Cardiologist [facility]

## 2011-09-24 NOTE — ED Notes (Addendum)
EKG done at 0345; new and old EKG given to and signed by Dr. Nicanor Alcon.

## 2011-09-24 NOTE — ED Provider Notes (Signed)
History     CSN: [id number]  Arrival date & time 09/24/11  0340   First MD Initiated Contact with William Pacheco 09/24/11 [phone]      Chief Complaint  William Pacheco presents with  . Shortness of Breath     William Pacheco is a 61 y.o. male presenting with shortness of breath. The history is provided by the William Pacheco.  Shortness of Breath  The current episode started more than 2 weeks ago. The problem occurs occasionally. The problem has been gradually worsening. The problem is mild. The symptoms are aggravated by activity and a supine position. Associated symptoms include shortness of breath and wheezing. Pertinent negatives include no chest pain, no chest pressure, no orthopnea, no fever, no stridor and no cough. His past medical history does not include asthma or past wheezing.   William Pacheco presents to the emergency department tonight with increasing shortness of breath over the last 3 weeks. Shortness of breath has worsened over the last week, worse with exertion and lying flat. William Pacheco had outpatient surgery on 08/30/2011. Stents placed in his left femoral artery. William Pacheco states he did not stay overnight. Admits approximately 2-3 days after this procedure is when the shortness of breath started, growing worse until today. Tonight he was unable to sleep due to increase shortness of breath on lying flat. He also states that he has an unusual tightness in his right neck. William Pacheco has had a mild intermittent cough but no known fever. He denies chest pain, nausea, vomiting, diarrhea or other associated symptoms. Significant history for CAD (see remote history of 6 vessel bypass in late 90s) and and significant PAD. Has had a right fem-pop bypass and this recent stent placement and left femoral artery.  Past Medical History  Diagnosis Date  . Arthritis   . Peripheral vascular disease   . Adopted     Pt has limited medical knowledge of family history  . Diabetes mellitus   . History of hepatitis B 1980's  . ED (erectile  dysfunction)   . Renal artery stenosis   . Peripheral arterial disease   . Hypertension   . Hyperlipidemia   . CAD (coronary artery disease)     Past Surgical History  Procedure Date  . Coronary artery bypass graft 2008     X 6 vessel  . Femoral endarterectomy 03-15-2008    Left EIA & CFA endarterectomy by Dr. Madilyn Fireman  . Knee arthroscopy     Right  . Tonsillectomy     History reviewed. No pertinent family history.  History  Substance Use Topics  . Smoking status: Former Smoker    Quit date: 07/03/2003  . Smokeless tobacco: Not on file  . Alcohol Use: No      Review of Systems  Constitutional: Negative.  Negative for fever and chills.  HENT: Negative.   Eyes: Negative.   Respiratory: Positive for shortness of breath and wheezing. Negative for cough, chest tightness and stridor.   Cardiovascular: Negative.  Negative for chest pain and orthopnea.  Gastrointestinal: Negative.   Genitourinary: Negative.   Musculoskeletal: Negative.   Skin: Negative.   Neurological: Negative.   Hematological: Negative.   Psychiatric/Behavioral: Negative.     Allergies  Review of William Pacheco's allergies indicates no known allergies.  Home Medications   Current Outpatient Rx  Name Route Sig Dispense Refill  . ASPIRIN 325 MG PO TABS Oral Take 325 mg by mouth daily.    Marland Kitchen CARVEDILOL 25 MG PO TABS Oral Take 37.5 mg by mouth 2 (two)  times daily with a meal.     . AMLODIPINE BESYLATE 10 MG PO TABS Oral Take 10 mg by mouth daily.    Marland Kitchen CALCIUM CARBONATE 600 MG PO TABS Oral Take 600 mg by mouth daily.    Marland Kitchen CILOSTAZOL 100 MG PO TABS Oral Take 100 mg by mouth 2 (two) times daily.    Marland Kitchen CLOPIDOGREL BISULFATE 75 MG PO TABS Oral Take 1 tablet (75 mg total) by mouth daily. 30 tablet 11  . INSULIN ASPART 100 UNIT/ML [place] SOLN Subcutaneous Inject 3-4 Units into the skin 3 (three) times daily before meals. Sliding scale    . INSULIN GLARGINE 100 UNIT/ML [place] SOLN Subcutaneous Inject 45 Units into the skin  daily.     Marland Kitchen PIOGLITAZONE HCL-METFORMIN HCL 15-850 MG PO TABS Oral Take 1 tablet by mouth 2 (two) times daily with a meal.    . PRESCRIPTION MEDICATION  William Pacheco stated no longer taking plavix but is taking another medication similar. Waiting to call pharmacy when it opens to verify.    Marland Kitchen RAMIPRIL 10 MG PO CAPS Oral Take 10 mg by mouth daily.    Marland Kitchen SIMVASTATIN 40 MG PO TABS Oral Take 40 mg by mouth every evening.      BP 131/66  Pulse 101  Temp(Src) 97.5 F (36.4 C) (Oral)  Resp 16  SpO2 100%  Physical Exam  Constitutional: He appears well-developed and well-nourished.  HENT:  Head: Normocephalic and atraumatic.  Eyes: Conjunctivae are normal.  Neck: Neck supple.  Cardiovascular: Regular rhythm and intact distal pulses.  Tachycardia present.   Pulmonary/Chest: Effort normal.       Mild expiratory wheezes bilaterally at bases,  right greater than left  Abdominal: Soft. Bowel sounds are normal.  Musculoskeletal: Normal range of motion.  Neurological: He is alert.  Skin: Skin is warm and dry.  Psychiatric: He has a normal mood and affect.    ED Course  Procedures   Mild bibasilar opacities with underlying vascular congestion. BNP 345. I have discussed this William Pacheco with Dr. Nicanor Alcon. Will  start Zosyn and Vanc and plan for admission for HAP. CT angio-chest pending. I have discussed this William Pacheco with the C. Lawyer, PA who has agreed to accept care of this William Pacheco pending CT chest angiogram, EKG, troponin and ultimate disposition.  Labs Reviewed  CBC - Abnormal; Notable for the following:    Platelets 137 (*)    All other components within normal limits  BASIC METABOLIC PANEL - Abnormal; Notable for the following:    Glucose, Bld 306 (*)    All other components within normal limits  PRO B NATRIURETIC PEPTIDE - Abnormal; Notable for the following:    Pro B Natriuretic peptide (BNP) 384.5 (*)    All other components within normal limits  POCT I-STAT, CHEM 8 - Abnormal; Notable for  the following:    Glucose, Bld 306 (*)    All other components within normal limits  POCT I-STAT TROPONIN I   Dg Chest 2 View  09/24/2011  *RADIOLOGY REPORT*  Clinical Data: Cough, chest pain, wheezing and shortness of breath.  CHEST - 2 VIEW  Comparison: Chest radiograph performed 03/11/2008  Findings: The lungs are well-aerated.  Mild bibasilar airspace opacities are noted; underlying vascular congestion is noted.  This may reflect multifocal pneumonia or interstitial edema.  Trace fluid is noted tracking along both minor fissures.  There is no evidence of pneumothorax.  The heart is borderline normal in size; the William Pacheco is status post  median sternotomy, with evidence of prior CABG.  No acute osseous abnormalities are seen.  Mild calcification is noted along the proximal abdominal aorta.  IMPRESSION: Mild bibasilar airspace opacities, with underlying vascular congestion.  This may reflect multifocal pneumonia or interstitial edema.  Original Report Authenticated By: Tonia Ghent, M.D.     No diagnosis found.    MDM  HPI/PE and clinical findings c/w 1. SOB (chest x-ray findings suspicious for bilateral pneumonia (VAnc and zosyn initiated) versus vascular congestion, BNP 345, CT angio chest, EKg and Trop I pending)  Disposition pending      Leanne Chang, NP 09/24/11 0630

## 2011-09-24 NOTE — Progress Notes (Signed)
Utilization Review Completed.Sacred Roa T3/25/2013   

## 2011-09-24 NOTE — Progress Notes (Signed)
  Echocardiogram 2D Echocardiogram has been performed.  William Pacheco 09/24/2011, 3:03 PM

## 2011-09-24 NOTE — H&P (Signed)
PCP:   Dorrene German, MD, MD   Chief Complaint:  Dyspnea   HPI: 61 year old gentleman with coronary disease status post bypass grafting, presenting to the emergency room with progressive onset of dyspnea for the past 2 weeks. He denies any fever chills or productive cough. He reports that the dyspnea is worse when he is lying flat and that he cannot walk for long distances. He denies any chest pain. He denies using any drugs. He says that he doesn't think he gained any weight.   Review of Systems:  The patient denies anorexia, fever, , vision loss, decreased hearing, hoarseness, chest pain, syncope, balance deficits, hemoptysis, abdominal pain, melena, hematochezia, severe indigestion/heartburn, hematuria, incontinence, genital sores, muscle weakness, suspicious skin lesions, transient blindness, difficulty walking, depression,  Had a stent placed in his femoral artery recently Past Medical History: Past Medical History  Diagnosis Date  . Arthritis   . Peripheral vascular disease   . Adopted     Pt has limited medical knowledge of family history  . Diabetes mellitus   . History of hepatitis B 1980's  . ED (erectile dysfunction)   . Renal artery stenosis   . Peripheral arterial disease   . Hypertension   . Hyperlipidemia   . CAD (coronary artery disease)    Past Surgical History  Procedure Date  . Coronary artery bypass graft 2008     X 6 vessel  . Femoral endarterectomy 03-15-2008    Left EIA & CFA endarterectomy by Dr. Madilyn Fireman  . Knee arthroscopy     Right  . Tonsillectomy     Medications: Prior to Admission medications   Medication Sig Start Date End Date Taking? Authorizing Provider  aspirin 325 MG tablet Take 325 mg by mouth daily.   Yes Historical Provider, MD  carvedilol (COREG) 25 MG tablet Take 37.5 mg by mouth 2 (two) times daily with a meal.    Yes Historical Provider, MD  amLODipine (NORVASC) 10 MG tablet Take 10 mg by mouth daily.    Historical Provider, MD    calcium carbonate (OS-CAL) 600 MG TABS Take 600 mg by mouth daily.    Historical Provider, MD  cilostazol (PLETAL) 100 MG tablet Take 100 mg by mouth 2 (two) times daily.    Historical Provider, MD  clopidogrel (PLAVIX) 75 MG tablet Take 1 tablet (75 mg total) by mouth daily. 08/30/11   Fransisco Hertz, MD  insulin aspart (NOVOLOG) 100 UNIT/ML injection Inject 3-4 Units into the skin 3 (three) times daily before meals. Sliding scale    Historical Provider, MD  insulin glargine (LANTUS) 100 UNIT/ML injection Inject 45 Units into the skin daily.     Historical Provider, MD  pioglitazone-metformin (ACTOPLUS MET) 15-850 MG per tablet Take 1 tablet by mouth 2 (two) times daily with a meal.    Historical Provider, MD  PRESCRIPTION MEDICATION Patient stated no longer taking plavix but is taking another medication similar. Waiting to call pharmacy when it opens to verify.    Historical Provider, MD  ramipril (ALTACE) 10 MG capsule Take 10 mg by mouth daily.    Historical Provider, MD  simvastatin (ZOCOR) 40 MG tablet Take 40 mg by mouth every evening.    Historical Provider, MD    Allergies:  No Known Allergies  Social History:  reports that he quit smoking about 8 years ago. He does not have any smokeless tobacco history on file. He reports that he does not drink alcohol or use illicit drugs.  History  Social History Narrative  . No narrative on file   He's been adopted so he doesn't know his family history  Family History: History reviewed. No pertinent family history.  Physical Exam: Filed Vitals:   09/24/11 0344 09/24/11 0420 09/24/11 0743  BP: 162/96 131/66 149/98  Pulse: 98 101 113  Temp: 97.5 F (36.4 C)  98.1 F (36.7 C)  TempSrc: Oral  Oral  Resp: 22 16 20   SpO2: 96% 100% 94%   Alert and oriented x3 Head normocephalic atraumatic Eyes pupils equal round react to light and accommodation Throat clear without exudate Neck with bilateral JVD Chest bilateral crackles wheezes no  rhonchi Heart regular without murmurs Abdomen distended soft nontender Both legs have pretibial edema Skin warm dry no suspicious looking rashes Cranial nerves 2-12 are intact Strength is 5 out of 5 in all 4 extremities Sensation is intact   Labs on Admission:   [facility] 09/24/11 0417 09/24/11 0358  NA 141 136  K 4.6 4.5  CL 104 103  CO2 -- 26  GLUCOSE 306* 306*  BUN 14 12  CREATININE 1.00 0.87  CALCIUM -- 8.9  MG -- --  PHOS -- --   No results found for this basename: AST:2,ALT:2,ALKPHOS:2,BILITOT:2,PROT:2,ALBUMIN:2 in the last 72 hours No results found for this basename: LIPASE:2,AMYLASE:2 in the last 72 hours  Basename 09/24/11 0417 09/24/11 0358  WBC -- 6.8  NEUTROABS -- --  HGB 15.3 14.2  HCT 45.0 42.9  MCV -- 87.4  PLT -- 137*    Basename 09/24/11 0610  CKTOTAL --  CKMB --  CKMBINDEX --  TROPONINI <0.30   No results found for this basename: TSH,T4TOTAL,FREET3,T3FREE,THYROIDAB in the last 72 hours No results found for this basename: VITAMINB12:2,FOLATE:2,FERRITIN:2,TIBC:2,IRON:2,RETICCTPCT:2 in the last 72 hours  I have personally reviewed EKG - st depression V5-V6   Radiological Exams on Admission: Dg Chest 2 View  09/24/2011  *RADIOLOGY REPORT*  Clinical Data: Cough, chest pain, wheezing and shortness of breath.  CHEST - 2 VIEW  Comparison: Chest radiograph performed 03/11/2008  Findings: The lungs are well-aerated.  Mild bibasilar airspace opacities are noted; underlying vascular congestion is noted.  This may reflect multifocal pneumonia or interstitial edema.  Trace fluid is noted tracking along both minor fissures.  There is no evidence of pneumothorax.  The heart is borderline normal in size; the patient is status post median sternotomy, with evidence of prior CABG.  No acute osseous abnormalities are seen.  Mild calcification is noted along the proximal abdominal aorta.  IMPRESSION: Mild bibasilar airspace opacities, with underlying vascular congestion.   This may reflect multifocal pneumonia or interstitial edema.  Original Report Authenticated By: Tonia Ghent, M.D.   I have personally reviewed Ct Angio Chest W/cm &/or Wo Cm  09/24/2011  *RADIOLOGY REPORT*  Clinical Data: Shortness of breath and chest congestion.  Right- sided neck tightness.  CT ANGIOGRAPHY CHEST  Technique:  Multidetector CT imaging of the chest using the standard protocol during bolus administration of intravenous contrast. Multiplanar reconstructed images including MIPs were obtained and reviewed to evaluate the vascular anatomy.  Contrast: OMNIPAQUE IOHEXOL 350 MG/ML IV SOLN  Comparison: Chest radiograph performed earlier today at 04:06 a.m.  Findings: There is no evidence of significant pulmonary embolus.  Small bilateral pleural effusions are noted, right greater than left.  Diffuse interstitial prominence is seen, with bibasilar airspace opacities, compatible with pulmonary edema.  There is no evidence of significant pneumothorax.  No masses are identified; no abnormal focal contrast enhancement is  seen.  Prominent mediastinal and right hilar nodes are seen, measuring up to 1.7 cm in size; enlarged precarinal, aortopulmonary window, periaortic and right paratracheal nodes are noted.  Diffuse coronary artery calcifications are seen.  No pericardial effusion is identified.  The great vessels are grossly unremarkable in appearance.  The patient is status post median sternotomy.  No axillary lymphadenopathy is seen.  The thyroid gland is unremarkable in appearance.  The visualized portions of the liver and spleen are unremarkable. The visualized portions of the pancreas, gallbladder, stomach, adrenal glands and left kidney are within normal limits.  There is reflux of contrast into the IVC and hepatic veins.  No acute osseous abnormalities are seen.  Anterior bridging osteophytes are noted along the lower thoracic spine.  IMPRESSION:  1.  No evidence of significant pulmonary  embolus. 2.  Small bilateral pleural effusions, right greater than left, with diffuse interstitial prominence and bibasilar airspace opacities, compatible with pulmonary edema. 3.  Diffusely enlarged mediastinal and right hilar nodes, measuring up to 1.7 cm in size; these are of uncertain etiology. 4.  Incidental reflux of contrast into the IVC and hepatic veins.  Original Report Authenticated By: Tonia Ghent, M.D.    Assessment/Plan Present on Admission:  .Atherosclerosis of native arteries of the extremities with intermittent claudication .Peripheral vascular disease .Hypertension .CAD (coronary artery disease) .Hyperlipidemia .Pulmonary edema .DM (diabetes mellitus), type 2, uncontrolled .Thrombocytopenia .Pleural effusion on right .Pleural effusion on left   This is a 61 year old gentleman admitted with dyspnea. His CT scan findings and his clinical findings indicate most likely new-onset congestive heart failure syndrome. Similar appearance and similar clinical status could be seen with cocaine use, capillary leak, severe low albumin states. The reason for decompensation could be related to the patient's abundant water intake. He also could have silent ischemia. Plan is to admit the patient to telemetry, obtain transthoracic echocardiogram, obtain urinary drug screen, obtain liver profile. Start treatment with intravenous furosemide. [facility] cardiology has been called The patient will be continue Lantus and sliding scale novolog for his diabetes For now I will assume that the bilateral pleural effusions are related to transudative effusion - but careful followup will have to be ensured Further plans of care depending on how he progresses  William Pacheco 09/24/2011, 8:37 AM

## 2011-09-24 NOTE — ED Provider Notes (Deleted)
Medical screening examination/treatment/procedure(s) were conducted as a shared visit with non-physician practitioner(s) and myself.  I personally evaluated the patient during the encounter  Patient presents with CP, focal seizure of the LUE, lesion on the head reportedly opened by Dr. Briant Cedar.    Awake and alert. Has lesion boggy on the right scalp with central scab PERRL, EOMI, TM in tact B MMM Neck supple RRR diminshed BS B NABS, soft nontender FROM x 4 extremities   Date: 09/24/2011  Rate: 102  Rhythm: sinus tachycardia  QRS Axis: left  Intervals: QT prolonged  ST/T Wave abnormalities: nonspecific T wave changes ST depression 1 mm 34F  Conduction Disutrbances:none  Narrative Interpretation:   Old EKG Reviewed: changes noted Case d/w Dr. Franky Macho of neurosurgery, will eval for expanded subdural hematoma Case d/w Dr. Darrick Penna, PCCM no one available to see patient until after 7 am Insulin control with glucomander protocol, seizure medication loaded with keppra, cycle cardiac enzymes  Ahlam Piscitelli K Shabnam Ladd-Rasch, MD 09/24/11 1610

## 2011-09-24 NOTE — ED Provider Notes (Signed)
History     CSN: [id number]  Arrival date & time 09/24/11  0340   First MD Initiated Contact with Patient 09/24/11 [phone]      Chief Complaint  Patient presents with  . Shortness of Breath    (Consider location/radiation/quality/duration/timing/severity/associated sxs/prior treatment) Patient is a 61 y.o. male presenting with shortness of breath. The history is provided by the patient. No language interpreter was used.  Shortness of Breath  The current episode started today (was happening intermittently for weeks but worst and suddent tonight). The onset was sudden. The problem occurs rarely. The problem has been gradually worsening. The problem is severe. The symptoms are relieved by nothing. The symptoms are aggravated by nothing. Associated symptoms include shortness of breath. Pertinent negatives include no wheezing. There was no intake of a foreign body. He was not exposed to toxic fumes. He has had no prior intubations. His past medical history does not include asthma. He has been behaving normally. Recently, medical care has been given at this facility. Services received include medications given.  Also orthopnea  Past Medical History  Diagnosis Date  . Arthritis   . Peripheral vascular disease   . Adopted     Pt has limited medical knowledge of family history  . Diabetes mellitus   . History of hepatitis B 1980's  . ED (erectile dysfunction)   . Renal artery stenosis   . Peripheral arterial disease   . Hypertension   . Hyperlipidemia   . CAD (coronary artery disease)     Past Surgical History  Procedure Date  . Coronary artery bypass graft 2008     X 6 vessel  . Femoral endarterectomy 03-15-2008    Left EIA & CFA endarterectomy by Dr. Madilyn Fireman  . Knee arthroscopy     Right  . Tonsillectomy     History reviewed. No pertinent family history.  History  Substance Use Topics  . Smoking status: Former Smoker    Quit date: 07/03/2003  . Smokeless tobacco: Not on file    . Alcohol Use: No      Review of Systems  Constitutional: Negative.   HENT: Negative.   Eyes: Negative.   Respiratory: Positive for chest tightness and shortness of breath. Negative for wheezing.   Cardiovascular: Negative.   Gastrointestinal: Negative.   Genitourinary: Negative.   Musculoskeletal: Negative.   Neurological: Negative.   Hematological: Negative.   Psychiatric/Behavioral: Negative.     Allergies  Review of patient's allergies indicates no known allergies.  Home Medications   Current Outpatient Rx  Name Route Sig Dispense Refill  . ASPIRIN 325 MG PO TABS Oral Take 325 mg by mouth daily.    Marland Kitchen CARVEDILOL 25 MG PO TABS Oral Take 37.5 mg by mouth 2 (two) times daily with a meal.     . AMLODIPINE BESYLATE 10 MG PO TABS Oral Take 10 mg by mouth daily.    Marland Kitchen CALCIUM CARBONATE 600 MG PO TABS Oral Take 600 mg by mouth daily.    Marland Kitchen CILOSTAZOL 100 MG PO TABS Oral Take 100 mg by mouth 2 (two) times daily.    Marland Kitchen CLOPIDOGREL BISULFATE 75 MG PO TABS Oral Take 1 tablet (75 mg total) by mouth daily. 30 tablet 11  . INSULIN ASPART 100 UNIT/ML [place] SOLN Subcutaneous Inject 3-4 Units into the skin 3 (three) times daily before meals. Sliding scale    . INSULIN GLARGINE 100 UNIT/ML [place] SOLN Subcutaneous Inject 45 Units into the skin daily.     Marland Kitchen  PIOGLITAZONE HCL-METFORMIN HCL 15-850 MG PO TABS Oral Take 1 tablet by mouth 2 (two) times daily with a meal.    . PRESCRIPTION MEDICATION  Patient stated no longer taking plavix but is taking another medication similar. Waiting to call pharmacy when it opens to verify.    Marland Kitchen RAMIPRIL 10 MG PO CAPS Oral Take 10 mg by mouth daily.    Marland Kitchen SIMVASTATIN 40 MG PO TABS Oral Take 40 mg by mouth every evening.      BP 131/66  Pulse 101  Temp(Src) 97.5 F (36.4 C) (Oral)  Resp 16  SpO2 100%  Physical Exam  Constitutional: He is oriented to person, place, and time. He appears well-developed and well-nourished. No distress.  HENT:  Head: Normocephalic  and atraumatic.  Mouth/Throat: Oropharynx is clear and moist.  Eyes: Conjunctivae are normal. Pupils are equal, round, and reactive to light.  Neck: Normal range of motion. Neck supple.  Cardiovascular: Normal rate and regular rhythm.   Pulmonary/Chest: He has decreased breath sounds.  Abdominal: Soft. Bowel sounds are normal.  Musculoskeletal: Normal range of motion. He exhibits edema.  Neurological: He is alert and oriented to person, place, and time.  Skin: Skin is warm and dry.  Psychiatric: He has a normal mood and affect.    ED Course  Procedures (including critical care time)  Labs Reviewed  CBC - Abnormal; Notable for the following:    Platelets 137 (*)    All other components within normal limits  BASIC METABOLIC PANEL - Abnormal; Notable for the following:    Glucose, Bld 306 (*)    All other components within normal limits  PRO B NATRIURETIC PEPTIDE - Abnormal; Notable for the following:    Pro B Natriuretic peptide (BNP) 384.5 (*)    All other components within normal limits  POCT I-STAT, CHEM 8 - Abnormal; Notable for the following:    Glucose, Bld 306 (*)    All other components within normal limits  POCT I-STAT TROPONIN I  TROPONIN I   Dg Chest 2 View  09/24/2011  *RADIOLOGY REPORT*  Clinical Data: Cough, chest pain, wheezing and shortness of breath.  CHEST - 2 VIEW  Comparison: Chest radiograph performed 03/11/2008  Findings: The lungs are well-aerated.  Mild bibasilar airspace opacities are noted; underlying vascular congestion is noted.  This may reflect multifocal pneumonia or interstitial edema.  Trace fluid is noted tracking along both minor fissures.  There is no evidence of pneumothorax.  The heart is borderline normal in size; the patient is status post median sternotomy, with evidence of prior CABG.  No acute osseous abnormalities are seen.  Mild calcification is noted along the proximal abdominal aorta.  IMPRESSION: Mild bibasilar airspace opacities, with  underlying vascular congestion.  This may reflect multifocal pneumonia or interstitial edema.  Original Report Authenticated By: Tonia Ghent, M.D.   Ct Angio Chest W/cm &/or Wo Cm  09/24/2011  *RADIOLOGY REPORT*  Clinical Data: Shortness of breath and chest congestion.  Right- sided neck tightness.  CT ANGIOGRAPHY CHEST  Technique:  Multidetector CT imaging of the chest using the standard protocol during bolus administration of intravenous contrast. Multiplanar reconstructed images including MIPs were obtained and reviewed to evaluate the vascular anatomy.  Contrast: OMNIPAQUE IOHEXOL 350 MG/ML IV SOLN  Comparison: Chest radiograph performed earlier today at 04:06 a.m.  Findings: There is no evidence of significant pulmonary embolus.  Small bilateral pleural effusions are noted, right greater than left.  Diffuse interstitial prominence is seen,  with bibasilar airspace opacities, compatible with pulmonary edema.  There is no evidence of significant pneumothorax.  No masses are identified; no abnormal focal contrast enhancement is seen.  Prominent mediastinal and right hilar nodes are seen, measuring up to 1.7 cm in size; enlarged precarinal, aortopulmonary window, periaortic and right paratracheal nodes are noted.  Diffuse coronary artery calcifications are seen.  No pericardial effusion is identified.  The great vessels are grossly unremarkable in appearance.  The patient is status post median sternotomy.  No axillary lymphadenopathy is seen.  The thyroid gland is unremarkable in appearance.  The visualized portions of the liver and spleen are unremarkable. The visualized portions of the pancreas, gallbladder, stomach, adrenal glands and left kidney are within normal limits.  There is reflux of contrast into the IVC and hepatic veins.  No acute osseous abnormalities are seen.  Anterior bridging osteophytes are noted along the lower thoracic spine.  IMPRESSION:  1.  No evidence of significant pulmonary  embolus. 2.  Small bilateral pleural effusions, right greater than left, with diffuse interstitial prominence and bibasilar airspace opacities, compatible with pulmonary edema. 3.  Diffusely enlarged mediastinal and right hilar nodes, measuring up to 1.7 cm in size; these are of uncertain etiology. 4.  Incidental reflux of contrast into the IVC and hepatic veins.  Original Report Authenticated By: Tonia Ghent, M.D.     No diagnosis found.    MDM  Medical screening examination/treatment/procedure(s) were conducted as a shared visit with non-physician practitioner(s) and myself.  I personally evaluated the patient during the encounter        Emitt Maglione K Marshaun Lortie-Rasch, MD 09/24/11 [phone]

## 2011-09-24 NOTE — Consult Note (Signed)
Reason for Consult:  CHF Referring Physician:  [name] is an 61 y.o. male.  HPI: patient is a 61 year old African American male with history of coronary artery disease status post coronary artery bypass grafting in September 2008. He had a LIMA to the LAD, left radial to the D1, SVG to the OM1/OM 2 sequential, SVG to the PDA/PL sequential.  Isthmus was performed artery disease most recently had a stent to the femoral artery in February of 2013. He also has a history of type 2 diabetes this lipidemia. His last Myoview exam was January 2010 showed mild perfusion defect in the basal inferior and mid inferior walls with mild reversibility consistent with the previous study. His most recent echo was in July 2010 showed mild concentric LVH and ejection fraction of 45%. Mild to moderate left atrial dilatation and mild to moderate MR. The patient reports 3 work 3 weeks worth of shortness of breath which has been progressively getting worse.  He reports that last night he was sleeping in the recliner and got up to go to bed, and when he did so, he had increased shortness of breath when he laid down and had to sit upright. He also noticed "his chest sounded real watery".  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 His weight in during last office visit in September 2012 was 228 pounds he is currently on admission was 222 pounds.  This reports that he weighs himself daily his tract by [facility].  He denies chest pain palpitations nausea vomiting fever abdominal pain dysuria hematuria hematochezia, LEE.   Prior to committing him the patient was in sinus rhythm. During the interview nurse entered the room indicating the patient's heart rate was in the 140s. Upon reexamination of the  telemetry the patient had converted to atrial fibrillation with a rapid ventricular response.  His BNP was slightly elevated at 348 the patient received IV Lasix in the emergency room. This provided relief 2 symptoms.  Past Medical History  Diagnosis Date  . Arthritis   . Peripheral vascular disease   . Adopted     Pt has limited medical knowledge of family history  . Diabetes mellitus   . History of hepatitis B 1980's  . ED (erectile dysfunction)   . Renal artery stenosis   . Peripheral arterial disease   . Hypertension   . Hyperlipidemia   . CAD (coronary artery disease)   . Shortness of breath   . Blood transfusion     " NO REACTION TO TRANSFUSION  . Hepatitis     HX of hepatitis B  . Neuromuscular disorder     PERIFERAL NEUROPATHY     Past Surgical History  Procedure Date  . Coronary artery bypass graft 2008     X 6 vessel  . Femoral endarterectomy 03-15-2008    Left EIA & CFA endarterectomy by Dr. Madilyn Fireman  . Knee arthroscopy     Right  . Tonsillectomy   . Vascular surgery     History reviewed. No pertinent family history.  Social History:  reports that he quit smoking about 8 years ago. He has never used smokeless tobacco. He reports that he does not drink alcohol or use illicit drugs.  Allergies: No Known Allergies  Medications:     .  albuterol  5 mg Nebulization Once  . aspirin  325 mg Oral Daily  . carvedilol  37.5 mg Oral BID WC  . cilostazol  100 mg Oral BID  . clopidogrel  75 mg Oral Daily  . diltiazem  10 mg Intravenous Once  . enoxaparin  40 mg Subcutaneous Q24H  . furosemide  40 mg Intravenous Once  . furosemide  40 mg Intravenous Q12H  . insulin aspart  0-9 Units Subcutaneous TID WC  . insulin glargine  45 Units Subcutaneous Daily  . ipratropium  0.5 mg Nebulization Once  . potassium chloride  10 mEq Oral BID  . ramipril  10 mg Oral Daily  . simvastatin  40 mg Oral QPM  . sodium chloride  3 mL Intravenous Q12H  . DISCONTD:  piperacillin-tazobactam (ZOSYN)  IV  3.375 g Intravenous Once  . DISCONTD: vancomycin  1,000 mg Intravenous Once     Results for orders placed during the hospital encounter of 09/24/11 (from the past 48 hour(s))  CBC     Status: Abnormal   Collection Time   09/24/11  3:58 AM      Component Value Range Comment   WBC 6.8  4.0 - 10.5 (K/uL)    RBC 4.91  4.22 - 5.81 (MIL/uL)    Hemoglobin 14.2  13.0 - 17.0 (g/dL)    HCT 14.7  82.9 - 56.2 (%)    MCV 87.4  78.0 - 100.0 (fL)    MCH 28.9  26.0 - 34.0 (pg)    MCHC 33.1  30.0 - 36.0 (g/dL)    RDW 13.0  86.5 - 78.4 (%)    Platelets 137 (*) 150 - 400 (K/uL)   BASIC METABOLIC PANEL     Status: Abnormal   Collection Time   09/24/11  3:58 AM      Component Value Range Comment   Sodium 136  135 - 145 (mEq/L)    Potassium 4.5  3.5 - 5.1 (mEq/L)    Chloride 103  96 - 112 (mEq/L)    CO2 26  19 - 32 (mEq/L)    Glucose, Bld 306 (*) 70 - 99 (mg/dL)    BUN 12  6 - 23 (mg/dL)    Creatinine, Ser 6.96  0.50 - 1.35 (mg/dL)    Calcium 8.9  8.4 - 10.5 (mg/dL)    GFR calc non Af Amer >90  >90 (mL/min)    GFR calc Af Amer >90  >90 (mL/min)   PRO B NATRIURETIC PEPTIDE     Status: Abnormal   Collection Time   09/24/11  3:59 AM      Component Value Range Comment   Pro B Natriuretic peptide (BNP) 384.5 (*) 0 - 125 (pg/mL)   POCT I-STAT TROPONIN I     Status: Normal   Collection Time   09/24/11  4:14 AM      Component Value Range Comment   Troponin i, poc 0.05  0.00 - 0.08 (ng/mL)    Comment 3            POCT I-STAT, CHEM 8     Status: Abnormal   Collection Time   09/24/11  4:17 AM      Component Value Range Comment   Sodium 141  135 - 145 (mEq/L)    Potassium 4.6  3.5 - 5.1 (mEq/L)    Chloride 104  96 - 112 (mEq/L)    BUN 14  6 - 23 (mg/dL)    Creatinine, Ser 2.95  0.50 - 1.35 (mg/dL)    Glucose, Bld 621 (*) 70 - 99 (mg/dL)    Calcium, Ion 3.08  1.12 - 1.32 (mmol/L)    TCO2 26  0 - 100 (mmol/L)    Hemoglobin 15.3  13.0 - 17.0 (g/dL)    HCT 65.7   84.6 - 96.2 (%)   TROPONIN I     Status: Normal   Collection Time   09/24/11  6:10 AM      Component Value Range Comment   Troponin I <0.30  <0.30 (ng/mL)   POCT I-STAT TROPONIN I     Status: Normal   Collection Time   09/24/11  7:44 AM      Component Value Range Comment   Troponin i, poc 0.02  0.00 - 0.08 (ng/mL)    Comment 3            COMPREHENSIVE METABOLIC PANEL     Status: Abnormal   Collection Time   09/24/11  7:57 AM      Component Value Range Comment   Sodium 136  135 - 145 (mEq/L)    Potassium 4.7  3.5 - 5.1 (mEq/L)    Chloride 104  96 - 112 (mEq/L)    CO2 21  19 - 32 (mEq/L)    Glucose, Bld 235 (*) 70 - 99 (mg/dL)    BUN 12  6 - 23 (mg/dL)    Creatinine, Ser 9.52  0.50 - 1.35 (mg/dL)    Calcium 8.9  8.4 - 10.5 (mg/dL)    Total Protein 5.9 (*) 6.0 - 8.3 (g/dL)    Albumin 3.3 (*) 3.5 - 5.2 (g/dL)    AST 35  0 - 37 (U/L)    ALT 74 (*) 0 - 53 (U/L)    Alkaline Phosphatase 48  39 - 117 (U/L)    Total Bilirubin 0.9  0.3 - 1.2 (mg/dL)    GFR calc non Af Amer >90  >90 (mL/min)    GFR calc Af Amer >90  >90 (mL/min)   CARDIAC PANEL(CRET KIN+CKTOT+MB+TROPI)     Status: Abnormal   Collection Time   09/24/11  7:57 AM      Component Value Range Comment   Total CK 223  7 - 232 (U/L)    CK, MB 6.4 (*) 0.3 - 4.0 (ng/mL)    Troponin I <0.30  <0.30 (ng/mL)    Relative Index 2.9 (*) 0.0 - 2.5    URINE RAPID DRUG SCREEN (HOSP PERFORMED)     Status: Normal   Collection Time   09/24/11  9:39 AM      Component Value Range Comment   Opiates NONE DETECTED  NONE DETECTED     Cocaine NONE DETECTED  NONE DETECTED     Benzodiazepines NONE DETECTED  NONE DETECTED     Amphetamines NONE DETECTED  NONE DETECTED     Tetrahydrocannabinol NONE DETECTED  NONE DETECTED     Barbiturates NONE DETECTED  NONE DETECTED      Dg Chest 2 View  09/24/2011  *RADIOLOGY REPORT*  Clinical Data: Cough, chest pain, wheezing and shortness of breath.  CHEST - 2 VIEW  Comparison: Chest radiograph performed  03/11/2008  Findings: The lungs are well-aerated.  Mild bibasilar airspace opacities are noted; underlying vascular congestion is noted.  This may reflect multifocal pneumonia or interstitial edema.  Trace fluid is noted tracking along both minor fissures.  There is no evidence of pneumothorax.  The heart is borderline normal in size; the patient  is status post median sternotomy, with evidence of prior CABG.  No acute osseous abnormalities are seen.  Mild calcification is noted along the proximal abdominal aorta.  IMPRESSION: Mild bibasilar airspace opacities, with underlying vascular congestion.  This may reflect multifocal pneumonia or interstitial edema.  Original Report Authenticated By: Tonia Ghent, M.D.   Ct Angio Chest W/cm &/or Wo Cm  09/24/2011  *RADIOLOGY REPORT*  Clinical Data: Shortness of breath and chest congestion.  Right- sided neck tightness.  CT ANGIOGRAPHY CHEST  Technique:  Multidetector CT imaging of the chest using the standard protocol during bolus administration of intravenous contrast. Multiplanar reconstructed images including MIPs were obtained and reviewed to evaluate the vascular anatomy.  Contrast: OMNIPAQUE IOHEXOL 350 MG/ML IV SOLN  Comparison: Chest radiograph performed earlier today at 04:06 a.m.  Findings: There is no evidence of significant pulmonary embolus.  Small bilateral pleural effusions are noted, right greater than left.  Diffuse interstitial prominence is seen, with bibasilar airspace opacities, compatible with pulmonary edema.  There is no evidence of significant pneumothorax.  No masses are identified; no abnormal focal contrast enhancement is seen.  Prominent mediastinal and right hilar nodes are seen, measuring up to 1.7 cm in size; enlarged precarinal, aortopulmonary window, periaortic and right paratracheal nodes are noted.  Diffuse coronary artery calcifications are seen.  No pericardial effusion is identified.  The great vessels are grossly unremarkable  in appearance.  The patient is status post median sternotomy.  No axillary lymphadenopathy is seen.  The thyroid gland is unremarkable in appearance.  The visualized portions of the liver and spleen are unremarkable. The visualized portions of the pancreas, gallbladder, stomach, adrenal glands and left kidney are within normal limits.  There is reflux of contrast into the IVC and hepatic veins.  No acute osseous abnormalities are seen.  Anterior bridging osteophytes are noted along the lower thoracic spine.  IMPRESSION:  1.  No evidence of significant pulmonary embolus. 2.  Small bilateral pleural effusions, right greater than left, with diffuse interstitial prominence and bibasilar airspace opacities, compatible with pulmonary edema. 3.  Diffusely enlarged mediastinal and right hilar nodes, measuring up to 1.7 cm in size; these are of uncertain etiology. 4.  Incidental reflux of contrast into the IVC and hepatic veins.  Original Report Authenticated By: Tonia Ghent, M.D.    Review of Systems  Constitutional: Positive for malaise/fatigue. Negative for fever, chills and diaphoresis.  HENT: Positive for neck pain (right-sided began 0300hrs.   feels like a pinch ). Negative for congestion.   Eyes: Negative for blurred vision.  Respiratory: Positive for cough (a little) and shortness of breath. Negative for hemoptysis and wheezing.   Cardiovascular: Positive for orthopnea. Negative for chest pain, palpitations, leg swelling and PND.  Gastrointestinal: Negative for nausea, vomiting, abdominal pain, diarrhea, constipation, blood in stool and melena.  Genitourinary: Negative for dysuria and hematuria.  Neurological: Positive for weakness. Negative for dizziness and headaches.   Blood pressure 152/75, pulse 92, temperature 98.1 F (36.7 C), temperature source Oral, resp. rate 18, height 5\' 11"  (1.803 m), weight 100.9 kg (222 lb 7.1 oz), SpO2 95.00%. Physical Exam  Constitutional: He is oriented to person,  place, and time. He appears well-developed and well-nourished. No distress.  HENT:  Head: Normocephalic and atraumatic.  Eyes: EOM are normal. Pupils are equal, round, and reactive to light. No scleral icterus.  Neck: No JVD present.  Cardiovascular: An irregularly irregular rhythm present. Tachycardia present.   No murmur heard. Pulses:  Radial pulses are 2+ on the right side, and 0 on the left side.       Dorsalis pedis pulses are 2+ on the right side, and 2+ on the left side.  Respiratory: Effort normal and breath sounds normal. He has no wheezes. He has no rales.  GI: Soft. Bowel sounds are normal. He exhibits no distension and no mass. There is no tenderness.  Musculoskeletal: He exhibits no edema.  Neurological: He is alert and oriented to person, place, and time. He exhibits normal muscle tone.  Skin: Skin is warm and dry.  Psychiatric: He has a normal mood and affect.    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  His BNP is only slightly elevated.  He'll be started on ASA.  Patient was given 10 mg IV bolus of Cardizem. He was started on 5 mg per hour IV drip. I will also continue Coreg.  The patient doesn't convert to normal sinus rhythm would consider TEE cardioversion.  Kyleigh Nannini W 09/24/2011, 12:00 PM

## 2011-09-24 NOTE — ED Notes (Addendum)
Patient complaining of shortness of breath and chest congestion over the last week; patient states that symptoms became more severe upon lying down for bed tonight.  Patient states that there has been several episodes over the last week where he had to sit down and concentrate on his breathing due to the sudden onset of shortness of breath.  Patient denies chest pain; patient reports CABG in 2008 and stent placement in left calf three weeks ago. Reports "tightness" in right side of neck.

## 2011-09-25 LAB — GLUCOSE, CAPILLARY: Glucose-Capillary: 192 mg/dL — ABNORMAL HIGH (ref 70–99)

## 2011-09-25 LAB — BASIC METABOLIC PANEL
BUN: 20 mg/dL (ref 6–23)
GFR calc Af Amer: 76 mL/min — ABNORMAL LOW (ref >90)
GFR calc non Af Amer: 65 mL/min — ABNORMAL LOW (ref >90)
Potassium: 4 mEq/L (ref 3.5–5.1)
Sodium: 137 mEq/L (ref 135–145)

## 2011-09-25 MED ORDER — CARVEDILOL 25 MG PO TABS: 37.5000 mg | ORAL_TABLET | Freq: Two times a day (BID) | ORAL | Status: DC

## 2011-09-25 MED ORDER — INSULIN GLARGINE 100 UNIT/ML ~~LOC~~ SOLN: 55.0000 [IU] | Freq: Every day | SUBCUTANEOUS | Status: DC

## 2011-09-25 MED ORDER — DABIGATRAN ETEXILATE MESYLATE 150 MG PO CAPS: 150.0000 mg | ORAL_CAPSULE | Freq: Two times a day (BID) | ORAL | Status: DC

## 2011-09-25 MED ORDER — DILTIAZEM HCL 100 MG IV SOLR
5.0000 mg/h | INTRAVENOUS | Status: AC
  Administered 2011-09-25: 5 mg/h via INTRAVENOUS
  Filled 2011-09-25: qty 100

## 2011-09-25 MED ORDER — FUROSEMIDE 20 MG PO TABS: 20.0000 mg | ORAL_TABLET | Freq: Every day | ORAL | Status: DC

## 2011-09-25 MED ORDER — OFF THE BEAT BOOK
Freq: Once | Status: DC
  Filled 2011-09-25 (×2): qty 1

## 2011-09-25 MED ORDER — ACETAMINOPHEN 325 MG PO TABS: 650.0000 mg | ORAL_TABLET | ORAL | Status: AC | PRN

## 2011-09-25 MED ORDER — ASPIRIN 81 MG PO TABS: 81.0000 mg | ORAL_TABLET | Freq: Every day | ORAL | Status: DC

## 2011-09-25 MED ORDER — ATORVASTATIN CALCIUM 20 MG PO TABS: 20.0000 mg | ORAL_TABLET | Freq: Every day | ORAL | Status: DC

## 2011-09-25 MED ORDER — CILOSTAZOL 100 MG PO TABS: 100.0000 mg | ORAL_TABLET | Freq: Two times a day (BID) | ORAL | Status: AC

## 2011-09-25 MED ORDER — DABIGATRAN ETEXILATE MESYLATE 150 MG PO CAPS
150.0000 mg | ORAL_CAPSULE | Freq: Two times a day (BID) | ORAL | Status: DC
  Administered 2011-09-25: 150 mg via ORAL
  Filled 2011-09-25 (×2): qty 1

## 2011-09-25 MED ORDER — POTASSIUM CHLORIDE CRYS ER 10 MEQ PO TBCR: 10.0000 meq | EXTENDED_RELEASE_TABLET | Freq: Two times a day (BID) | ORAL | Status: DC

## 2011-09-25 MED ORDER — RAMIPRIL 10 MG PO CAPS: 10.0000 mg | ORAL_CAPSULE | Freq: Every day | ORAL | Status: DC

## 2011-09-25 MED ORDER — DILTIAZEM HCL ER COATED BEADS 240 MG PO CP24
240.0000 mg | ORAL_CAPSULE | Freq: Every day | ORAL | Status: DC
  Administered 2011-09-25: 240 mg via ORAL
  Filled 2011-09-25: qty 1

## 2011-09-25 MED ORDER — DILTIAZEM HCL ER COATED BEADS 240 MG PO CP24: 240.0000 mg | ORAL_CAPSULE | Freq: Every day | ORAL | Status: DC

## 2011-09-25 MED ORDER — ATORVASTATIN CALCIUM 20 MG PO TABS
20.0000 mg | ORAL_TABLET | Freq: Every day | ORAL | Status: DC
  Administered 2011-09-25: 20 mg via ORAL
  Filled 2011-09-25: qty 1

## 2011-09-25 NOTE — Progress Notes (Signed)
Chart review is complete. Patient is currently enrolled in a [facility] HMO plan as a primary payor.  This currently renders him ineligible for services.  Please consider consulting with the onsite [facility] Liaison [facility] [phone].  For any additional questions or new referrals please contact Anibal Henderson BSN RN [facility] Liaison at [phone].

## 2011-09-25 NOTE — Progress Notes (Signed)
Pt. Seen and examined. Agree with the NP/PA-C note as written. Will assume care of patient on our service. He is feeling better. Rate controlled a-fib. Start po cardizem 240 mg daily. 1st dose now, then d.c cardizem gtts in 2 hours after oral dose. Start pradaxa 150 mg po BID. Probable d/c later today. Follow-up with me in 1 month in the office.  Chrystie Nose, MD, [facility] Attending Cardiologist [facility]

## 2011-09-25 NOTE — Discharge Instructions (Signed)
Low salt, diabetic diet.  Pradaxa is to prevent clots from forming.

## 2011-09-25 NOTE — Progress Notes (Signed)
   CARE MANAGEMENT NOTE 09/25/2011  Patient:  William Pacheco, William Pacheco   Account Number:  [id number]  Date Initiated:  09/25/2011  Documentation initiated by:  Donn Pierini  Subjective/Objective Assessment:   Pt admitted with a-fib, new onset CHF     Action/Plan:   PTA pt lived at home with spouse, was independent with ADLs   Anticipated DC Date:  09/27/2011   Anticipated DC Plan:  HOME/SELF CARE      DC Planning Services  CM consult      Choice offered to / List presented to:             Status of service:  Completed, signed off Medicare Important Message given?   (If response is "NO", the following Medicare IM given date fields will be blank) Date Medicare IM given:   Date Additional Medicare IM given:    Discharge Disposition:  HOME/SELF CARE  Per UR Regulation:    If discussed at Long Length of Stay Meetings, dates discussed:    Comments:  PCP- Av[place]uere  09-25-11 1548 Tomi Bamberger, RN,BSN [phone] NO PRIOR AUTH NEEDED. CO PAY WILL BE $40. Will make pt aware.  09-25-11 1402 Tomi Bamberger, RN,BSN [phone] CM will give pt pradaxa savings card. Will do a benefits check on medicaiton. Thanks  09/25/11- 1155- Donn Pierini RN, BSN [phone] Per notes in chart pt with new onset CHF- referral made to [facility] for CHF f/u at discharge- per Tim with [facility] pt does not qualify for program- spoke with Carollee Herter with [facility]- who will refer pt to insurance provider case manager for home f/u when discharged. CM to follow for any further d/c needs.

## 2011-09-25 NOTE — Progress Notes (Signed)
Inpatient Diabetes Program Recommendations  AACE/ADA: New Consensus Statement on Inpatient Glycemic Control (2009)  Target Ranges:  Prepandial:   less than 140 mg/dL      Peak postprandial:   less than 180 mg/dL (1-2 hours)      Critically ill patients:  140 - 180 mg/dL   Reason for Visit: Hyperglycemia and elevated Hbg A1c  Inpatient Diabetes Program Recommendations Insulin - Basal: Patient states that his Lantus dose was 55 units every day prior to admission.  Former dose was 45 units daily.  (At home takes Lantus at 1 PM before going to work. Insulin - Meal Coverage: May benefit from addition of 3 or 4 units of regularly scheduled Novolog meal coverage tid with each meal in addition to correction scale while at the hospital.  Patient not receptive to taking meal coverage Novolog with each meal at home.. HgbA1C: Hbg A1C od 9.8 indicates sub-optimal control of diabetes. Outpatient Referral: Patient not receptive to notion of Outpatient Diabetes follow-up.  Suggested to talk to PCP regarding a referral if he changes his mind.  Note: Patient works 2 PM to 10 PM.  Not agreeable to taking Novolog pen to work- mostly cited security reasons.   Takes Lantus at 1PM before going to work at [employer].  [employer] late at night after supper-- Gets off work at [employer], goes home, eats supper, takes novolog once a day by sliding scale, then goes to bed.  Since patient not willing to take Novolog with each meal, suggest he take some meal coverage before supper instead of after-- if this is his largest meal of the day.  If not the largest meal of day, perhaps he will take then provided it is while he is at home.

## 2011-09-25 NOTE — Progress Notes (Signed)
   CARE MANAGEMENT NOTE 09/25/2011  Patient:  ABDOULIE, TIERCE   Account Number:  [id number]  Date Initiated:  09/25/2011  Documentation initiated by:  Donn Pierini  Subjective/Objective Assessment:   Pt admitted with a-fib, new onset CHF     Action/Plan:   PTA pt lived at home with spouse, was independent with ADLs   Anticipated DC Date:  09/27/2011   Anticipated DC Plan:  HOME/SELF CARE      DC Planning Services  CM consult      Choice offered to / List presented to:             Status of service:  In process, will continue to follow Medicare Important Message given?   (If response is "NO", the following Medicare IM given date fields will be blank) Date Medicare IM given:   Date Additional Medicare IM given:    Discharge Disposition:    Per UR Regulation:    If discussed at Long Length of Stay Meetings, dates discussed:    Comments:  PCP- Avbuere  09/25/11- 1155- Donn Pierini RN, BSN [phone] Per notes in chart pt with new onset CHF- referral made to [facility] for CHF f/u at discharge- per Tim with [facility] pt does not qualify for program- spoke with Carollee Herter with [facility]- who will refer pt to insurance provider case manager for home f/u when discharged. CM to follow for any further d/c needs.

## 2011-09-25 NOTE — Progress Notes (Signed)
   CARE MANAGEMENT NOTE 09/25/2011  Patient:  William Pacheco, William Pacheco   Account Number:  [id number]  Date Initiated:  09/25/2011  Documentation initiated by:  Donn Pierini  Subjective/Objective Assessment:   Pt admitted with a-fib, new onset CHF     Action/Plan:   PTA pt lived at home with spouse, was independent with ADLs   Anticipated DC Date:  09/27/2011   Anticipated DC Plan:  HOME/SELF CARE      DC Planning Services  CM consult      Choice offered to / List presented to:             Status of service:  In process, will continue to follow Medicare Important Message given?   (If response is "NO", the following Medicare IM given date fields will be blank) Date Medicare IM given:   Date Additional Medicare IM given:    Discharge Disposition:    Per UR Regulation:    If discussed at Long Length of Stay Meetings, dates discussed:    Comments:  PCP- Avbuere 09-25-11 [address] RN,BSN [phone] CM will give pt pradaxa savings card. Will do a benefits check on medicaiton. Thanks  09/25/11- 1155- Donn Pierini RN, BSN [phone] Per notes in chart pt with new onset CHF- referral made to [facility] for CHF f/u at discharge- per Tim with [facility] pt does not qualify for program- spoke with Carollee Herter with [facility]- who will refer pt to insurance provider case manager for home f/u when discharged. CM to follow for any further d/c needs.

## 2011-09-25 NOTE — Consult Note (Signed)
Reviewed by Friend Dorfman EdD 

## 2011-09-25 NOTE — Progress Notes (Signed)
Brief history: Patient is a 61 year old African American male with history of coronary artery disease status post coronary artery bypass grafting in September 2008. He had a LIMA to the LAD, left radial to the D1, SVG to the OM1/OM 2 sequential, SVG to the PDA/PL sequential. Isthmus was performed artery disease most recently had a stent to the femoral artery in February of 2013. He also has a history of type 2 diabetes this lipidemia. His last Myoview exam was January 2010 showed mild perfusion defect in the basal inferior and mid inferior walls with mild reversibility consistent with the previous study. His most recent echo was in July 2010 showed mild concentric LVH and ejection fraction of 45%. Mild to moderate left atrial dilatation and mild to moderate MR. The patient reports 3 work 3 weeks worth of shortness of breath which has been progressively getting worse. He reports that last night he was sleeping in the recliner and got up to go to bed, and when he did so, he had increased shortness of breath when he laid down and had to sit upright. He also noticed "his chest sounded real watery".  During the interview nurse entered the room indicating the patient's heart rate was in the 140s. Upon reexamination of the telemetry the patient had converted to atrial fibrillation with a rapid ventricular response. His BNP was slightly elevated at 348 the patient received IV Lasix in the emergency room   Subjective: No chest pain. Denies SOB.  Objective: Vital signs in last 24 hours: Temp:  [97.4 F (36.3 C)-98.6 F (37 C)] 97.5 F (36.4 C) (03/26 0832) Pulse Rate:  [58-101] 72  (03/26 0832) Resp:  [18-20] 18  (03/26 0832) BP: (96-156)/(55-75) 116/67 mmHg (03/26 0832) SpO2:  [94 %-95 %] 94 % (03/26 0832) Weight:  [100.9 kg (222 lb 7.1 oz)-101.6 kg (223 lb 15.8 oz)] 101.6 kg (223 lb 15.8 oz) (03/26 0500) Weight change:  Last BM Date: 09/24/11 Intake/Output from previous day:-735 03/25 0701 - 03/26  0700 In: 840 [P.O.:840] Out: 1575 [Urine:1575] Intake/Output this shift: Total I/O In: -  Out: 100 [Urine:100]  PE: General:A&O X 3, MAE, follows commands Neck:no JVD sitting up right Heart:S1S2 RRR Lungs:clear without rales, rhonchi or wheezes. Abd:+BS, Soft, non tender Ext:no edema.   Lab Results:  Basename 09/24/11 0417 09/24/11 0358  WBC -- 6.8  HGB 15.3 14.2  HCT 45.0 42.9  PLT -- 137*   BMET  Basename 09/25/11 0600 09/24/11 0757  NA 137 136  K 4.0 4.7  CL 104 104  CO2 25 21  GLUCOSE 176* 235*  BUN 20 12  CREATININE 1.19 0.76  CALCIUM 8.8 8.9    Basename 09/24/11 2016 09/24/11 1413  TROPONINI <0.30 <0.30    No results found for this basename: CHOL, HDL, LDLCALC, LDLDIRECT, TRIG, CHOLHDL   Lab Results  Component Value Date   HGBA1C 9.8* 09/24/2011     No results found for this basename: TSH    Hepatic Function Panel  Basename 09/24/11 0757  PROT 5.9*  ALBUMIN 3.3*  AST 35  ALT 74*  ALKPHOS 48  BILITOT 0.9  BILIDIR --  IBILI --   No results found for this basename: CHOL in the last 72 hours No results found for this basename: PROTIME in the last 72 hours    EKG: Orders placed during the hospital encounter of 09/24/11  . ED EKG  . ED EKG  . ED EKG  . ED EKG    Studies/Results: Dg Chest  2 View  09/24/2011  *RADIOLOGY REPORT*  Clinical Data: Cough, chest pain, wheezing and shortness of breath.  CHEST - 2 VIEW  Comparison: Chest radiograph performed 03/11/2008  Findings: The lungs are well-aerated.  Mild bibasilar airspace opacities are noted; underlying vascular congestion is noted.  This may reflect multifocal pneumonia or interstitial edema.  Trace fluid is noted tracking along both minor fissures.  There is no evidence of pneumothorax.  The heart is borderline normal in size; the patient is status post median sternotomy, with evidence of prior CABG.  No acute osseous abnormalities are seen.  Mild calcification is noted along the proximal  abdominal aorta.  IMPRESSION: Mild bibasilar airspace opacities, with underlying vascular congestion.  This may reflect multifocal pneumonia or interstitial edema.  Original Report Authenticated By: Tonia Ghent, M.D.   Ct Angio Chest W/cm &/or Wo Cm  09/24/2011  *RADIOLOGY REPORT*  Clinical Data: Shortness of breath and chest congestion.  Right- sided neck tightness.  CT ANGIOGRAPHY CHEST  Technique:  Multidetector CT imaging of the chest using the standard protocol during bolus administration of intravenous contrast. Multiplanar reconstructed images including MIPs were obtained and reviewed to evaluate the vascular anatomy.  Contrast: OMNIPAQUE IOHEXOL 350 MG/ML IV SOLN  Comparison: Chest radiograph performed earlier today at 04:06 a.m.  Findings: There is no evidence of significant pulmonary embolus.  Small bilateral pleural effusions are noted, right greater than left.  Diffuse interstitial prominence is seen, with bibasilar airspace opacities, compatible with pulmonary edema.  There is no evidence of significant pneumothorax.  No masses are identified; no abnormal focal contrast enhancement is seen.  Prominent mediastinal and right hilar nodes are seen, measuring up to 1.7 cm in size; enlarged precarinal, aortopulmonary window, periaortic and right paratracheal nodes are noted.  Diffuse coronary artery calcifications are seen.  No pericardial effusion is identified.  The great vessels are grossly unremarkable in appearance.  The patient is status post median sternotomy.  No axillary lymphadenopathy is seen.  The thyroid gland is unremarkable in appearance.  The visualized portions of the liver and spleen are unremarkable. The visualized portions of the pancreas, gallbladder, stomach, adrenal glands and left kidney are within normal limits.  There is reflux of contrast into the IVC and hepatic veins.  No acute osseous abnormalities are seen.  Anterior bridging osteophytes are noted along the lower  thoracic spine.  IMPRESSION:  1.  No evidence of significant pulmonary embolus. 2.  Small bilateral pleural effusions, right greater than left, with diffuse interstitial prominence and bibasilar airspace opacities, compatible with pulmonary edema. 3.  Diffusely enlarged mediastinal and right hilar nodes, measuring up to 1.7 cm in size; these are of uncertain etiology. 4.  Incidental reflux of contrast into the IVC and hepatic veins.  Original Report Authenticated By: Tonia Ghent, M.D.    Medications: I have reviewed the patient's current medications.    Marland Kitchen aspirin  325 mg Oral Daily  . carvedilol  37.5 mg Oral BID WC  . cilostazol  100 mg Oral BID  . clopidogrel  75 mg Oral Daily  . diltiazem  10 mg Intravenous Once  . enoxaparin  40 mg Subcutaneous Q24H  . furosemide  20 mg Oral Daily  . insulin aspart  0-9 Units Subcutaneous TID WC  . insulin glargine  45 Units Subcutaneous Daily  . potassium chloride  10 mEq Oral BID  . ramipril  10 mg Oral Daily  . simvastatin  40 mg Oral QPM  . sodium chloride  3 mL Intravenous Q12H  . DISCONTD: furosemide  40 mg Intravenous Q12H   Assessment/Plan: Patient Active Problem List  Diagnoses  . Atherosclerosis of native arteries of the extremities with intermittent claudication  . Peripheral vascular disease  . Hypertension  . CAD (coronary artery disease)  . Hyperlipidemia  . S/P CABG x 6  . Pulmonary edema  . DM (diabetes mellitus), type 2, uncontrolled  . Thrombocytopenia  . Pleural effusion on right  . Pleural effusion on left  . Atrial fibrillation with rapid ventricular response   PLAN: now maintaining SR.   Neg. MI, continues on IV Cardizem. Will change to po vs. Increase coreg to 50 BID?   Initial Pro BNP 384   No PE on CT angio.  Pt. Most likely in and out of PAF over last 3 weeks.   Anticoagulation on plavix and asprin.  Pt. With CAD s/p CABG, diabetes, HTN, decreased EF. Also PAD.   2D Echo:  - Left ventricle: The cavity size was  normal. There was mild concentric hypertrophy. Systolic function was mildly reduced. The estimated ejection fraction was in the range of 45% to 50%. There appears to be inferior hypokinesis. - Aortic valve: Sclerosis without stenosis. - Mitral valve: Calcified annulus. 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 - Left atrium: Moderate dilation. - Tricuspid valve: Mild regurgitation. - Pulmonary arteries: PA peak pressure: 34mm Hg (S). - Inferior vena cava: The vessel was normal in size; the respirophasic diameter changes were in the normal range (= 50%); findings are consistent with normal central venous pressure.         LOS: 1 day   Endya Austin R 09/25/2011, 8:42 AM

## 2011-09-26 DIAGNOSIS — I340 Nonrheumatic mitral (valve) insufficiency: Secondary | ICD-10-CM | POA: Insufficient documentation

## 2011-09-27 ENCOUNTER — Encounter (HOSPITAL_COMMUNITY): Payer: Self-pay | Admitting: Cardiology

## 2011-09-27 DIAGNOSIS — Z7901 Long term (current) use of anticoagulants: Secondary | ICD-10-CM

## 2011-09-27 NOTE — Discharge Summary (Signed)
Physician Discharge Summary  Patient ID: William Pacheco MRN: [id number] DOB/AGE: [birth year] 61 y.o.  Admit date: 09/24/2011 Discharge date: 09/25/11  Discharge Diagnoses:  Principal Problem:  *Atrial fibrillation with rapid ventricular response Active Problems:  Pulmonary edema  Hypertension  CAD (coronary artery disease)  Hyperlipidemia  S/P CABG x 6  DM (diabetes mellitus), type 2, uncontrolled  Anticoagulation adequate, on Pradaxa  Peripheral vascular disease  Thrombocytopenia  Atherosclerosis of native arteries of the extremities with intermittent claudication   Discharged Condition: good  Hospital Course: Patient is a 61 year old African American male with history of coronary artery disease status post coronary artery bypass grafting in September 2008. He had a LIMA to the LAD, left radial to the D1, SVG to the OM1/OM 2 sequential, SVG to the PDA/PL sequential. Isthmus was performed artery disease most recently had a stent to the femoral artery in February of 2013. He also has a history of type 2 diabetes. His last Myoview exam was January 2010 showed mild perfusion defect in the basal inferior and mid inferior walls with mild reversibility consistent with the previous study. His most recent echo was in July 2010 showed mild concentric LVH and ejection fraction of 45%. Mild to moderate left atrial dilatation and mild to moderate MR. The patient reports 3 work 3 weeks worth of shortness of breath which has been progressively getting worse. He reports that night prior to admit, he was sleeping in the recliner and got up to go to bed, and when he did so, he had increased shortness of breath when he laid down and had to sit upright. He also noticed "his chest sounded real watery".  During the interview nurse entered the room indicating the patient's heart rate was in the 140s. Upon reexamination of the telemetry the patient had converted to atrial fibrillation with a rapid ventricular  response. His BNP was slightly elevated at 348 the patient received IV Lasix in the emergency room.  Patient was admitted to telemetry floor continued with IV diuresis. It was felt his heart failure was secondary to atrial fib with rapid ventricular response. Once he was placed on IV Cardizem he converted to sinus rhythm.  IV Cardizem was continued until day of discharge and it was changed to by mouth Cardizem.  The morning of discharge he was stable without shortness of breath without any complaints and quite anxious to be discharged.  He ambulated in the hall without complications. He was started on Pradaxa for anticoagulation.  2-D echo during hospitalization:   - Left ventricle: The cavity size was normal. There was mild concentric hypertrophy. Systolic function was mildly reduced. The estimated ejection fraction was in the range of 45% to 50%. There appears to be inferior hypokinesis. - Aortic valve: Sclerosis without stenosis. - Mitral valve: Calcified annulus. 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 - Left atrium: Moderate dilation. - Tricuspid valve: Mild regurgitation. - Pulmonary arteries: PA peak pressure: 34mm Hg (S). - Inferior vena cava: The vessel was normal in size; the respirophasic diameter changes were in the normal range (= 50%); findings are consistent with normal central venous pressure.  Patient was seen and examined by Dr. Rennis Golden and was ready for discharge he will followup as an outpatient with his regular cardiologist Dr. Herbie Baltimore.  He will also follow with his primary care physician for diabetes management he was seen by the diabetic coordinator during his hospitalization and suggestions  were given to the patient to help him control his diabetes. The patient was admitted on Lantus and was discharged on Lantus and will followup with primary  care.  Consults: cardiology  Significant Diagnostic Studies:  Labs at discharge sodium 137 potassium 4.0 chloride 104 CO2 25 BUN 20 creatinine 1.19 calcium 8.8  Hemoglobin 14.2 hematocrit 42.9 platelets 137 WBC 6.8 ProBNP on admission 384.  Cardiac enzymes were negative with CK 200-179 MB 5.7-4.8 and troponin I all less than 0.30.  AST 35 ALT 74 total protein 5.9 total bili 0.9 glucose was elevated at 235.  Hemoglobin A1c 9.8 Drug screen was negative.  CT angiography chest: IMPRESSION:  1. No evidence of significant pulmonary embolus.  2. Small bilateral pleural effusions, right greater than left,  with diffuse interstitial prominence and bibasilar airspace  opacities, compatible with pulmonary edema.  3. Diffusely enlarged mediastinal and right hilar nodes, measuring  up to 1.7 cm in size; these are of uncertain etiology.  4. Incidental reflux of contrast into the IVC and hepatic veins.   Two-view chest x-ray: IMPRESSION:  Mild bibasilar airspace opacities, with underlying vascular  congestion. This may reflect multifocal pneumonia or interstitial  Edema--Please note cardiology felt this was all heart failure that was resolved at discharge.  Discharge Exam: Blood pressure 107/63, pulse 64, temperature 97.9 F (36.6 C), temperature source Oral, resp. rate 16, height 5\' 11"  (1.803 m), weight 101.6 kg (223 lb 15.8 oz), SpO2 97.00%.   General:A&O X 3, MAE, follows commands  Neck:no JVD sitting up right  Heart:S1S2 RRR  Lungs:clear without rales, rhonchi or wheezes.  Abd:+BS, Soft, non tender  Ext:no edema  Disposition: 01-Home or Self Care   Medication List  As of 09/27/2011 11:32 AM   TAKE these medications         acetaminophen 325 MG tablet   Commonly known as: TYLENOL   Take 2 tablets (650 mg total) by mouth every 4 (four) hours as needed for pain.      aspirin 81 MG tablet   Take 1 tablet (81 mg total) by mouth daily.      atorvastatin 20 MG tablet   Commonly  known as: LIPITOR   Take 1 tablet (20 mg total) by mouth daily at 6 PM.      carvedilol 25 MG tablet   Commonly known as: COREG   Take 1.5 tablets (37.5 mg total) by mouth 2 (two) times daily with a meal.      cilostazol 100 MG tablet   Commonly known as: PLETAL   Take 1 tablet (100 mg total) by mouth 2 (two) times daily.      dabigatran 150 MG Caps   Commonly known as: PRADAXA   Take 1 capsule (150 mg total) by mouth every 12 (twelve) hours.      diltiazem 240 MG 24 hr capsule   Commonly known as: CARDIZEM CD   Take 1 capsule (240 mg total) by mouth daily.      furosemide 20 MG tablet   Commonly known as: LASIX   Take 1 tablet (20 mg total) by mouth daily.      gabapentin 300 MG capsule   Commonly known as: NEURONTIN   Take 300 mg by mouth 3 (three) times daily.      insulin glargine 100 UNIT/ML injection   Commonly known as: LANTUS   Inject 55 Units into the skin daily.      potassium chloride 10 MEQ tablet   Commonly known as:  K-DUR,KLOR-CON   Take 1 tablet (10 mEq total) by mouth 2 (two) times daily.      ramipril 10 MG capsule   Commonly known as: ALTACE   Take 1 capsule (10 mg total) by mouth daily.           Follow-up Information    Follow up with AVBUERE,EDWIN A, MD. (call for follow up appt in 1-2 weeks)       Follow up with Marykay Lex, MD on 10/30/2011. (at 9:00 am)    Contact information:   [facility] [address] [place] [address] [phone]        Discharge instructions:  Low salt, diabetic diet.  Pradaxa is to prevent clots from forming.  SignedLeone Brand 09/27/2011, 11:32 AM

## 2011-09-28 NOTE — Discharge Summary (Signed)
William Mayfield C. Tristan Bramble, MD, FACC Attending Cardiologist [facility]  

## 2012-05-21 ENCOUNTER — Encounter: Payer: Self-pay | Admitting: Vascular Surgery

## 2012-11-26 ENCOUNTER — Telehealth: Payer: Self-pay | Admitting: Cardiology

## 2012-11-26 NOTE — Telephone Encounter (Signed)
[facility] pharmacy called and would like to get clarification on the amount to take for the Xarelto 20 mg.. Please Call at [phone]  Thanks

## 2012-11-27 MED ORDER — RIVAROXABAN 20 MG PO TABS: 20.0000 mg | ORAL_TABLET | Freq: Every day | ORAL | Status: DC

## 2012-11-27 NOTE — Telephone Encounter (Signed)
E-Rx sent to pharmacy with instructions

## 2012-12-03 ENCOUNTER — Other Ambulatory Visit: Payer: Self-pay | Admitting: *Deleted

## 2012-12-03 MED ORDER — RIVAROXABAN 20 MG PO TABS: 20.0000 mg | ORAL_TABLET | Freq: Every day | ORAL | Status: DC

## 2012-12-09 ENCOUNTER — Other Ambulatory Visit: Payer: Self-pay | Admitting: *Deleted

## 2012-12-09 MED ORDER — ATORVASTATIN CALCIUM 20 MG PO TABS: 20.0000 mg | ORAL_TABLET | Freq: Every day | ORAL | Status: DC

## 2012-12-10 ENCOUNTER — Other Ambulatory Visit: Payer: Self-pay | Admitting: *Deleted

## 2012-12-28 ENCOUNTER — Other Ambulatory Visit: Payer: Self-pay | Admitting: Cardiology

## 2012-12-29 NOTE — Telephone Encounter (Signed)
Rx was sent to pharmacy electronically. 

## 2013-01-12 ENCOUNTER — Other Ambulatory Visit: Payer: Self-pay | Admitting: Cardiology

## 2013-01-12 DIAGNOSIS — I25810 Atherosclerosis of coronary artery bypass graft(s) without angina pectoris: Secondary | ICD-10-CM

## 2013-01-16 ENCOUNTER — Other Ambulatory Visit: Payer: Self-pay | Admitting: *Deleted

## 2013-01-16 MED ORDER — POTASSIUM CHLORIDE ER 10 MEQ PO TBCR: 10.0000 meq | EXTENDED_RELEASE_TABLET | Freq: Two times a day (BID) | ORAL | Status: DC

## 2013-01-21 ENCOUNTER — Other Ambulatory Visit: Payer: Self-pay | Admitting: Cardiology

## 2013-01-27 ENCOUNTER — Other Ambulatory Visit: Payer: Self-pay | Admitting: Cardiology

## 2013-01-28 NOTE — Telephone Encounter (Signed)
Rx was sent to pharmacy electronically. 

## 2013-01-29 ENCOUNTER — Other Ambulatory Visit: Payer: Self-pay | Admitting: Cardiology

## 2013-03-03 ENCOUNTER — Ambulatory Visit (HOSPITAL_COMMUNITY)
Admission: RE | Admit: 2013-03-03 | Discharge: 2013-03-03 | Disposition: A | Discharge status: Home / Self Care | Payer: 59 | Source: Ambulatory Visit | Attending: Cardiovascular Disease | Admitting: Cardiovascular Disease

## 2013-03-03 DIAGNOSIS — I10 Essential (primary) hypertension: Secondary | ICD-10-CM | POA: Insufficient documentation

## 2013-03-03 DIAGNOSIS — I25810 Atherosclerosis of coronary artery bypass graft(s) without angina pectoris: Secondary | ICD-10-CM

## 2013-03-03 DIAGNOSIS — I2510 Atherosclerotic heart disease of native coronary artery without angina pectoris: Secondary | ICD-10-CM

## 2013-03-03 DIAGNOSIS — E785 Hyperlipidemia, unspecified: Secondary | ICD-10-CM | POA: Insufficient documentation

## 2013-03-03 NOTE — Progress Notes (Signed)
2D Echo Performed 03/03/2013    Mariadelcarmen Corella, RCS  

## 2013-03-04 ENCOUNTER — Encounter: Payer: Self-pay | Admitting: Cardiology

## 2013-03-11 ENCOUNTER — Encounter: Payer: Self-pay | Admitting: Cardiology

## 2013-03-12 ENCOUNTER — Ambulatory Visit (INDEPENDENT_AMBULATORY_CARE_PROVIDER_SITE_OTHER): Payer: 59 | Admitting: Cardiology

## 2013-03-12 ENCOUNTER — Encounter: Payer: Self-pay | Admitting: Cardiology

## 2013-03-12 VITALS — BP 130/70 | HR 75 | Ht 71.0 in | Wt 245.4 lb

## 2013-03-12 DIAGNOSIS — I059 Rheumatic mitral valve disease, unspecified: Secondary | ICD-10-CM

## 2013-03-12 DIAGNOSIS — I2589 Other forms of chronic ischemic heart disease: Secondary | ICD-10-CM

## 2013-03-12 DIAGNOSIS — E785 Hyperlipidemia, unspecified: Secondary | ICD-10-CM

## 2013-03-12 DIAGNOSIS — I340 Nonrheumatic mitral (valve) insufficiency: Secondary | ICD-10-CM

## 2013-03-12 DIAGNOSIS — I2510 Atherosclerotic heart disease of native coronary artery without angina pectoris: Secondary | ICD-10-CM

## 2013-03-12 DIAGNOSIS — Z7901 Long term (current) use of anticoagulants: Secondary | ICD-10-CM

## 2013-03-12 DIAGNOSIS — I10 Essential (primary) hypertension: Secondary | ICD-10-CM

## 2013-03-12 DIAGNOSIS — Z951 Presence of aortocoronary bypass graft: Secondary | ICD-10-CM

## 2013-03-12 DIAGNOSIS — I739 Peripheral vascular disease, unspecified: Secondary | ICD-10-CM

## 2013-03-12 DIAGNOSIS — I480 Paroxysmal atrial fibrillation: Secondary | ICD-10-CM

## 2013-03-12 DIAGNOSIS — I255 Ischemic cardiomyopathy: Secondary | ICD-10-CM

## 2013-03-12 DIAGNOSIS — I70219 Atherosclerosis of native arteries of extremities with intermittent claudication, unspecified extremity: Secondary | ICD-10-CM

## 2013-03-12 DIAGNOSIS — I4891 Unspecified atrial fibrillation: Secondary | ICD-10-CM

## 2013-03-12 DIAGNOSIS — E669 Obesity, unspecified: Secondary | ICD-10-CM

## 2013-03-12 NOTE — Patient Instructions (Addendum)
Increase exercise  Call make an appointment to she Dr Claudie Fisherman.  Have your cholesterol check by primary  Your physician wants you to follow-up in 12 month Dr Herbie Baltimore.  You will receive a reminder letter in the mail two months in advance. If you don't receive a letter, please call our office to schedule the follow-up appointment.

## 2013-03-13 ENCOUNTER — Encounter: Payer: Self-pay | Admitting: Cardiology

## 2013-03-15 ENCOUNTER — Other Ambulatory Visit: Payer: Self-pay | Admitting: Cardiology

## 2013-03-16 NOTE — Telephone Encounter (Signed)
Rx was sent to pharmacy electronically. 

## 2013-03-19 ENCOUNTER — Other Ambulatory Visit: Payer: Self-pay

## 2013-03-19 DIAGNOSIS — M79609 Pain in unspecified limb: Secondary | ICD-10-CM

## 2013-03-19 DIAGNOSIS — I739 Peripheral vascular disease, unspecified: Secondary | ICD-10-CM

## 2013-03-21 ENCOUNTER — Other Ambulatory Visit: Payer: Self-pay | Admitting: Cardiology

## 2013-03-23 NOTE — Telephone Encounter (Signed)
Rx was sent to pharmacy electronically. 

## 2013-03-28 ENCOUNTER — Encounter: Payer: Self-pay | Admitting: Cardiology

## 2013-03-28 DIAGNOSIS — I480 Paroxysmal atrial fibrillation: Secondary | ICD-10-CM | POA: Insufficient documentation

## 2013-03-28 DIAGNOSIS — E669 Obesity, unspecified: Secondary | ICD-10-CM | POA: Insufficient documentation

## 2013-03-28 NOTE — Assessment & Plan Note (Signed)
Simply based on having coronary disease he is on appropriate medications but is also taking cilostazol.

## 2013-03-28 NOTE — Assessment & Plan Note (Signed)
Relatively stable ejection fraction, I think that's probably close to 45 days with 2 echoes. He does have some dyspnea with walking, but is currently limited because of his claudication. He is on standing dose of Lasix, and the relatively well.

## 2013-03-28 NOTE — Assessment & Plan Note (Signed)
He has not had a heart catheterization since his CABG, his last Myoview was in 2013. In the absence of [place] symptoms, we'll hold off until at least 2013 but could potentially hold off for a couple more years if he is no further symptoms.

## 2013-03-28 NOTE — Progress Notes (Signed)
PCP: Dorrene German, MD  Clinic Note: Chief Complaint  Patient presents with  . Annual Exam    no chest pain,no sob, no edema    HPI: William Pacheco is a 62 y.o. male with a PMH below who presents today for annual followup of his coronary artery disease, PVD and atrial fibrillation.  Interval History: He presents today without really any major cardiac concerns. He does note worsening symptoms of claudication. He says it really is tired out after walking about 100 yards or his legs are feeling heavy and tight. He doesn't really localize which side. Oh no major complaints. He denies any chest tightness or chest pressure with rest or exertion. No dyspnea with rest or exertion. He does has not been doing as much exercise as he had before is put on about 20 pounds he lost in the past. He denies any heart failure symptoms of PND, orthopnea or edema her annual significant palpitations of late. No further episodes of atrial fibrillation again. He denies any significant palpitations or lightheadedness, dizziness, wooziness, 6. Near syncope. No TIA or amaurosis fugax symptoms.  No melena, hematochezia or hematuria. No significant nosebleeds.  Past Medical History  Diagnosis Date  . CAD (coronary artery disease), native coronary artery September 2008    Left main 50-60%, LAD 80% with D2 involved. RCA mid occlusion --> CABG  . 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 Ischemic cardiomyopathy September 2008    EF = 35-40%; THE 45-50% IN 2013 --> ECHO 03/03/13 - EF 40-45% LV: Moderate HK of basal-midlateral & inferior myocardium; c/w prior infarction in the distribution of the RCA or LCx. Mild-Mod MR with Calcified annulus. Mod TR  . History of MI (myocardial infarction) 2012    Noted by echocardiography, and nuclear stress testing. Known RCA occlusion prior to CABG  . PAF (paroxysmal atrial fibrillation) - initial presentation with RVR, and heart failure  2013    On a beta blocker, calcium channel blocker & Xarelto. Lexiscan Myoview 11/08/11 LV was enlarged with no evidence of ischemia. There was a moderate sized, moderate to severe in intensity fixed defect in the inferior wall suggestive of scar with no reversible ischemia. There was a basal septal akinesis w/severe global hypokinesis. EF = 31% (Which is different from the [facility] EF)  . Chronic diastolic CHF (congestive heart failure), NYHA class 2 2009, 2011; 2013    Exacerbated by A. fib RVR, currently on Lasix.  . Peripheral arterial disease 2009, 2011; 2013    2009, 2011; 2013  . Renal artery stenosis, non-flow-limiting   . DM (diabetes mellitus), type 2 with neurological complications     Peripheral neuropathy  . Diabetic peripheral neuropathy associated with type 2 diabetes mellitus   . Hyperlipidemia LDL goal <70     On statin therapy  . Obesity (BMI 30.0-34.9)   . Hypertension associated with diabetes   . Erectile dysfunction associated with type 2 diabetes mellitus  2006  . Ischemic mitral regurgitation  1980s    Mild to moderate  . Thrombocytopenia      chronic  . Osteoarthritis of both knees 1990    Status post left knee a arthroscopy, currently William Pacheco right knee  . History of hepatitis B 1980s  . Adopted     Adopted [V68.89]    Prior Cardiac Evaluation and Past Surgical History: Past Surgical History  Procedure Laterality Date  . Femoral endarterectomy  03-15-2008  Left EIA & CFA endarterectomy by Dr. Madilyn Fireman  . Knee arthroscopy Left 1990    For osteoarthritis pain.  . Tonsillectomy    . Vascular surgery    . Pleural effusion drainage Right   . Coronary artery bypass graft  03/2007    CABG x 6 LIMA-LAD, left radial to diagonal 1, SVG to OM1-OM2 sequential, SVG to PDA-PLA sequential.   No Known Allergies  Current Outpatient Prescriptions  Medication Sig Dispense Refill  . aspirin 81 MG tablet Take 1 tablet (81 mg total) by mouth daily.      Marland Kitchen atorvastatin  (LIPITOR) 20 MG tablet Take 1 tablet (20 mg total) by mouth daily.  30 tablet  4  . Blood Glucose Monitoring Suppl (ONE TOUCH ULTRA MINI) W/DEVICE KIT       . carvedilol (COREG) 25 MG tablet TAKE 1 AND 1/2 TABLETS TWICE DAILY  90 tablet  2  . gabapentin (NEURONTIN) 300 MG capsule Take 300 mg by mouth 3 (three) times daily.      . Glucosamine-Chondroit-Vit C-Mn (GLUCOSAMINE 1500 COMPLEX PO) Take by mouth 2 (two) times daily.      . insulin aspart (NOVOLOG) 100 UNIT/ML injection 30 units total  10 units  Morning  And 20 units evening      . insulin glargine (LANTUS) 100 UNIT/ML injection Inject 60 Units into the skin daily.      Marland Kitchen KLOR-CON M10 10 MEQ tablet Take 10 mEq by mouth daily.      Marland Kitchen LYRICA 75 MG capsule 75 mg 3 (three) times daily.       . potassium chloride (K-DUR) 10 MEQ tablet Take 1 tablet (10 mEq total) by mouth 2 (two) times daily.  60 tablet  3  . ramipril (ALTACE) 10 MG capsule TAKE 1 CAPSULE EVERY DAY  30 capsule  3  . Rivaroxaban (XARELTO) 20 MG TABS Take 1 tablet (20 mg total) by mouth daily.  30 tablet  6  . cilostazol (PLETAL) 100 MG tablet TAKE 1 TABLET TWICE A DAY  60 tablet  11  . diltiazem (CARDIZEM CD) 240 MG 24 hr capsule Take 1 capsule (240 mg total) by mouth daily.  30 capsule  11  . furosemide (LASIX) 20 MG tablet Take 1 tablet (20 mg total) by mouth daily.  30 tablet  11  . [DISCONTINUED] amLODipine (NORVASC) 10 MG tablet Take 10 mg by mouth daily.      . [DISCONTINUED] calcium carbonate (OS-CAL) 600 MG TABS Take 600 mg by mouth daily.      . [DISCONTINUED] pioglitazone-metformin (ACTOPLUS MET) 15-850 MG per tablet Take 1 tablet by mouth 2 (two) times daily with a meal.      . [DISCONTINUED] simvastatin (ZOCOR) 40 MG tablet Take 40 mg by mouth every evening.       No current facility-administered medications for this visit.    History   Social History Narrative   He is a married father of 3, grandfather 1. Does not routinely exercise, does not drink or smoke  alcohol or he works as a Copy, and has trouble making ends meet.    ROS: A comprehensive Review of Systems - Notably positive for leg fatigue and discomfort/cramping after walking roughly 100 yards. Also noted 20 pound weight gain do to decrease activity related to claudication. Not noticing of any cardiac symptoms.  PHYSICAL EXAM BP 130/70  Pulse 75  Ht 5\' 11"  (1.803 m)  Wt 245 lb 6.4 oz (111.313 kg)  BMI 34.24  kg/m2 General appearance: alert, cooperative, appears stated age, no distress and moderately obese Neck: no adenopathy, no carotid bruit, no JVD and supple, symmetrical, trachea midline Lungs: clear to auscultation bilaterally, normal percussion bilaterally and Nonlabored, good air movement Heart: normal apical impulse, regular rate and rhythm, S1, S2 normal, no S3 or S4, systolic murmur: Early peaking systolic ejection murmur 1/6 at the base, 1/6, blowing radiates to carotids and 2nd systolic murmur: holosystolic 1/6, blowing and buzzing at 2nd right intercostal space Abdomen: soft, non-tender; bowel sounds normal; no masses,  no organomegaly and Obese Extremities: extremities normal, atraumatic, no cyanosis or edema and no ulcers, gangrene or trophic changes Pulses: Diminished pulses right and left pedal, roughly 1+ Neurologic: Grossly normal  [id number] today: Yes Rate: 75 , Rhythm: Normal sinus rhythm, PVCs versus PAC with aaberrant conduction;    Recent Labs:  none currently available   ASSESSMENT / PLAN: Atherosclerosis of native arteries of the extremities with intermittent claudication He definitely has a significant PAD history, and is currently being followed by Dr. Imogene Burn. Based on his history symptoms of claudication walking 100 yards, it recommended he contact vascular surgeon's office in order to be reevaluated and possibly restudied.  CAD (coronary artery disease) - CABG x 6 Fairly well from a cardiac standpoint. No real and ~of symptoms or real heart failure  symptoms.   Currently on aspirin, statin, stable dose carvedilol, stable dose ramipril with standing Lasix daily.  Myoview 1 year ago was negative.  S/P CABG x 6 He has not had a heart catheterization since his CABG, his last Myoview was in 2013. In the absence of [place] symptoms, we'll hold off until at least 2013 but could potentially hold off for a couple more years if he is no further symptoms.  PAF (paroxysmal atrial fibrillation) - initial presentation with RVR, and heart failure Rate control would additional calcified walker in addition to the beta blocker. No recurrent symptoms second toe. Antiplatelet on Xarelto after being switched off of Pradaxa.  Ischemic cardiomyopathy Relatively stable ejection fraction, I think that's probably close to 45 days with 2 echoes. He does have some dyspnea with walking, but is currently limited because of his claudication. He is on standing dose of Lasix, and the relatively well.  PAD (peripheral artery disease) - R Fem-Pop, L SFA stent Simply based on having coronary disease he is on appropriate medications but is also taking cilostazol.   Anticoagulation adequate, on Xarelto No active bleeding. Tolerating well  Hyperlipidemia Currently on atorvastatin, was switched from simvastatin. He will be due for having lipid panel and chemistry panel checked. He's not sure if he still getting them filled/checked when he sees his primary physician. If not, we will be contacting him to see if you to return for fasting blood work.  Obesity (BMI 30-39.9) Weight loss efforts and place. Simply needs spelled annulate nor to lose weight.  Hypertension Well-controlled. He appears to be taking all his medications.    Orders Placed This Encounter  Procedures  . EKG 12-Lead    Followup: One year  Janyla Biscoe W. Herbie Baltimore, M.D., M.S. [facility] [address] [place] [address], [place]  [address]  [phone] Pager #  [phone]

## 2013-03-28 NOTE — Assessment & Plan Note (Signed)
Rate control would additional calcified walker in addition to the beta blocker. No recurrent symptoms second toe. Antiplatelet on Xarelto after being switched off of Pradaxa.

## 2013-03-28 NOTE — Assessment & Plan Note (Addendum)
Fairly well from a cardiac standpoint. No real and ~of symptoms or real heart failure symptoms.   Currently on aspirin, statin, stable dose carvedilol, stable dose ramipril with standing Lasix daily.  Myoview 1 year ago was negative.

## 2013-03-28 NOTE — Assessment & Plan Note (Signed)
Currently on atorvastatin, was switched from simvastatin. He will be due for having lipid panel and chemistry panel checked. He's not sure if he still getting them filled/checked when he sees his primary physician. If not, we will be contacting him to see if you to return for fasting blood work.

## 2013-03-28 NOTE — Assessment & Plan Note (Signed)
Well-controlled. He appears to be taking all his medications.

## 2013-03-28 NOTE — Assessment & Plan Note (Signed)
No active bleeding. Tolerating well

## 2013-03-28 NOTE — Assessment & Plan Note (Signed)
Weight loss efforts and place. Simply needs spelled annulate nor to lose weight.

## 2013-03-28 NOTE — Assessment & Plan Note (Signed)
He definitely has a significant PAD history, and is currently being followed by Dr. Imogene Burn. Based on his history symptoms of claudication walking 100 yards, it recommended he contact vascular surgeon's office in order to be reevaluated and possibly restudied.

## 2013-04-10 ENCOUNTER — Ambulatory Visit: Payer: 59 | Admitting: Vascular Surgery

## 2013-04-10 ENCOUNTER — Encounter (HOSPITAL_COMMUNITY): Payer: 59

## 2013-04-10 ENCOUNTER — Other Ambulatory Visit (HOSPITAL_COMMUNITY): Payer: 59

## 2013-04-23 ENCOUNTER — Encounter: Payer: Self-pay | Admitting: Vascular Surgery

## 2013-04-24 ENCOUNTER — Encounter: Payer: Self-pay | Admitting: Vascular Surgery

## 2013-04-24 ENCOUNTER — Ambulatory Visit (INDEPENDENT_AMBULATORY_CARE_PROVIDER_SITE_OTHER)
Admission: RE | Admit: 2013-04-24 | Discharge: 2013-04-24 | Disposition: A | Discharge status: Home / Self Care | Payer: 59 | Source: Ambulatory Visit | Attending: Vascular Surgery | Admitting: Vascular Surgery

## 2013-04-24 ENCOUNTER — Encounter (INDEPENDENT_AMBULATORY_CARE_PROVIDER_SITE_OTHER): Payer: Self-pay

## 2013-04-24 ENCOUNTER — Ambulatory Visit (INDEPENDENT_AMBULATORY_CARE_PROVIDER_SITE_OTHER): Payer: 59 | Admitting: Vascular Surgery

## 2013-04-24 ENCOUNTER — Ambulatory Visit (HOSPITAL_COMMUNITY)
Admission: RE | Admit: 2013-04-24 | Discharge: 2013-04-24 | Disposition: A | Discharge status: Home / Self Care | Payer: 59 | Source: Ambulatory Visit | Attending: Vascular Surgery | Admitting: Vascular Surgery

## 2013-04-24 VITALS — BP 152/74 | HR 77 | Resp 18 | Ht 71.0 in | Wt 230.0 lb

## 2013-04-24 DIAGNOSIS — I70219 Atherosclerosis of native arteries of extremities with intermittent claudication, unspecified extremity: Secondary | ICD-10-CM

## 2013-04-24 DIAGNOSIS — I739 Peripheral vascular disease, unspecified: Secondary | ICD-10-CM | POA: Insufficient documentation

## 2013-04-24 DIAGNOSIS — M79609 Pain in unspecified limb: Secondary | ICD-10-CM

## 2013-04-24 NOTE — Progress Notes (Signed)
[facility]  Established Intermittent Claudication  History of Present Illness  William Pacheco is a 62 y.o. (04-26-1951) male s/p R fem-pop BPG by Dr. Madilyn Fireman and L distal SFA stenting (08/30/11) who presents with chief complaint: right calf aching.  The patient's symptoms have progressed.  The patient's symptoms are: aching and cramping in R calf after 40 minutes of walking.  The patient's treatment regimen currently included: maximal medical management and walking plan.  The patient notes continue non-smoking but A1c in 10s.  The pt denies any wounds or rest pain.  The patient has been loss to follow-up  The patient's PMH, PSH, SH, FamHx, Med, and Allergies are unchanged from 08/30/11.  On ROS today: R knee pain improved after injections, denies any IC or rest pain  Physical Examination  Filed Vitals:   04/24/13 1257  BP: 152/74  Pulse: 77  Resp: 18  Height: 5\' 11"  (1.803 m)  Weight: 230 lb (104.327 kg)   Body mass index is 32.09 kg/(m^2).  General: A&O x 3, WD, obese  Pulmonary: Sym exp, good air movt, CTAB, no rales, rhonchi, & wheezing   Cardiac: RRR, Nl S1, S2, no Murmurs, rubs or gallops   Vascular:  Vessel  Right  Left   Radial  Palpable  Palpable   Brachial  Palpable  Palpable   Carotid  Palpable, without bruit  Palpable, without bruit   Aorta  Non-palpable  N/A   Femoral  Palpable  Palpable   Popliteal  Non-palpable  Non-palpable   PT  Faintly Palpable  Not Palpable   DP  Not Palpable  Faintly Palpable    Musculoskeletal: M/S 5/5 throughout , Extremities without ischemic changes , multiple healed incisions on right leg   Neurologic: Pain and light touch intact in extremities , Motor exam as listed above   Non-Invasive Vascular Imaging   ABI (Date: 04/24/2013)  R: 0.87 (0.87), DP: bi, PT: bi, TBI: 0.64  L: 0.83 (0.83), DP: mono, PT: mono, TBI: 0.65  B leg arterial Duplex (Date: 04/24/2013)  R: patent R fem-pop: PSV 194 c/s near  distal anastomosis (2.5) L: Patent SFA stent, PSV 247 c/s in proximal SFA (2.6)  Medical Decision Making  William Pacheco is a 62 y.o. male who presents with:  right leg intermittent claudication without evidence of critical limb ischemia.  Based on the patient's vascular studies and examination, I have offered the patient: q6 month surveillance studies consistenting of B ABI and BLE arterial duplex..  I discussed in depth with the patient the nature of atherosclerosis, and emphasized the importance of maximal medical management including strict control of blood pressure, blood glucose, and lipid levels, antiplatelet agents, obtaining regular exercise, and cessation of smoking.    The patient is aware that without maximal medical management the underlying atherosclerotic disease process will progress, limiting the benefit of any interventions. The patient is currently on a statin: Lipitor.   The patient is currently on an anti-platelet: ASA The patient is also on Xarelto.  Thank you for allowing [place] to participate in this patient's care.  [name], MD [facility] Office: [phone] Pager: [phone]  04/24/2013, 2:03 PM

## 2013-05-02 HISTORY — PX: TRANSTHORACIC ECHOCARDIOGRAM: SHX275

## 2013-05-08 ENCOUNTER — Other Ambulatory Visit: Payer: Self-pay | Admitting: Cardiology

## 2013-05-08 NOTE — Telephone Encounter (Signed)
Rx was sent to pharmacy electronically. 

## 2013-07-21 ENCOUNTER — Other Ambulatory Visit: Payer: Self-pay | Admitting: Family

## 2013-07-21 DIAGNOSIS — Z48812 Encounter for surgical aftercare following surgery on the circulatory system: Secondary | ICD-10-CM

## 2013-07-21 DIAGNOSIS — I739 Peripheral vascular disease, unspecified: Secondary | ICD-10-CM

## 2013-07-25 ENCOUNTER — Emergency Department (HOSPITAL_COMMUNITY): Payer: No Typology Code available for payment source

## 2013-07-25 ENCOUNTER — Encounter (HOSPITAL_COMMUNITY): Payer: Self-pay | Admitting: Emergency Medicine

## 2013-07-25 ENCOUNTER — Inpatient Hospital Stay (HOSPITAL_COMMUNITY)
Admission: EM | Admit: 2013-07-25 | Discharge: 2013-07-28 | DRG: 280 | Disposition: A | Discharge status: Home / Self Care | Payer: No Typology Code available for payment source | Attending: Internal Medicine | Admitting: Internal Medicine

## 2013-07-25 DIAGNOSIS — R778 Other specified abnormalities of plasma proteins: Secondary | ICD-10-CM | POA: Diagnosis not present

## 2013-07-25 DIAGNOSIS — M79609 Pain in unspecified limb: Secondary | ICD-10-CM

## 2013-07-25 DIAGNOSIS — I509 Heart failure, unspecified: Secondary | ICD-10-CM | POA: Diagnosis present

## 2013-07-25 DIAGNOSIS — J9600 Acute respiratory failure, unspecified whether with hypoxia or hypercapnia: Secondary | ICD-10-CM | POA: Diagnosis present

## 2013-07-25 DIAGNOSIS — E1142 Type 2 diabetes mellitus with diabetic polyneuropathy: Secondary | ICD-10-CM | POA: Diagnosis present

## 2013-07-25 DIAGNOSIS — I70219 Atherosclerosis of native arteries of extremities with intermittent claudication, unspecified extremity: Secondary | ICD-10-CM | POA: Diagnosis present

## 2013-07-25 DIAGNOSIS — Z951 Presence of aortocoronary bypass graft: Secondary | ICD-10-CM

## 2013-07-25 DIAGNOSIS — E1140 Type 2 diabetes mellitus with diabetic neuropathy, unspecified: Secondary | ICD-10-CM | POA: Diagnosis present

## 2013-07-25 DIAGNOSIS — Z87891 Personal history of nicotine dependence: Secondary | ICD-10-CM

## 2013-07-25 DIAGNOSIS — I214 Non-ST elevation (NSTEMI) myocardial infarction: Secondary | ICD-10-CM | POA: Diagnosis present

## 2013-07-25 DIAGNOSIS — Z9119 Patient's noncompliance with other medical treatment and regimen: Secondary | ICD-10-CM

## 2013-07-25 DIAGNOSIS — Z794 Long term (current) use of insulin: Secondary | ICD-10-CM

## 2013-07-25 DIAGNOSIS — I4891 Unspecified atrial fibrillation: Secondary | ICD-10-CM | POA: Diagnosis present

## 2013-07-25 DIAGNOSIS — IMO0002 Reserved for concepts with insufficient information to code with codable children: Secondary | ICD-10-CM | POA: Diagnosis present

## 2013-07-25 DIAGNOSIS — I5042 Chronic combined systolic (congestive) and diastolic (congestive) heart failure: Secondary | ICD-10-CM | POA: Diagnosis present

## 2013-07-25 DIAGNOSIS — J811 Chronic pulmonary edema: Secondary | ICD-10-CM

## 2013-07-25 DIAGNOSIS — I252 Old myocardial infarction: Secondary | ICD-10-CM

## 2013-07-25 DIAGNOSIS — I480 Paroxysmal atrial fibrillation: Secondary | ICD-10-CM | POA: Diagnosis present

## 2013-07-25 DIAGNOSIS — Z6830 Body mass index (BMI) 30.0-30.9, adult: Secondary | ICD-10-CM

## 2013-07-25 DIAGNOSIS — I255 Ischemic cardiomyopathy: Secondary | ICD-10-CM | POA: Diagnosis present

## 2013-07-25 DIAGNOSIS — E1149 Type 2 diabetes mellitus with other diabetic neurological complication: Secondary | ICD-10-CM | POA: Diagnosis present

## 2013-07-25 DIAGNOSIS — I2589 Other forms of chronic ischemic heart disease: Secondary | ICD-10-CM | POA: Diagnosis present

## 2013-07-25 DIAGNOSIS — I340 Nonrheumatic mitral (valve) insufficiency: Secondary | ICD-10-CM

## 2013-07-25 DIAGNOSIS — I739 Peripheral vascular disease, unspecified: Secondary | ICD-10-CM | POA: Diagnosis present

## 2013-07-25 DIAGNOSIS — D696 Thrombocytopenia, unspecified: Secondary | ICD-10-CM

## 2013-07-25 DIAGNOSIS — I10 Essential (primary) hypertension: Secondary | ICD-10-CM | POA: Diagnosis present

## 2013-07-25 DIAGNOSIS — E1165 Type 2 diabetes mellitus with hyperglycemia: Secondary | ICD-10-CM

## 2013-07-25 DIAGNOSIS — E785 Hyperlipidemia, unspecified: Secondary | ICD-10-CM | POA: Diagnosis present

## 2013-07-25 DIAGNOSIS — J810 Acute pulmonary edema: Secondary | ICD-10-CM

## 2013-07-25 DIAGNOSIS — N529 Male erectile dysfunction, unspecified: Secondary | ICD-10-CM | POA: Diagnosis present

## 2013-07-25 DIAGNOSIS — E669 Obesity, unspecified: Secondary | ICD-10-CM | POA: Diagnosis present

## 2013-07-25 DIAGNOSIS — I169 Hypertensive crisis, unspecified: Secondary | ICD-10-CM

## 2013-07-25 DIAGNOSIS — I2510 Atherosclerotic heart disease of native coronary artery without angina pectoris: Secondary | ICD-10-CM | POA: Diagnosis present

## 2013-07-25 DIAGNOSIS — I5023 Acute on chronic systolic (congestive) heart failure: Principal | ICD-10-CM | POA: Diagnosis present

## 2013-07-25 DIAGNOSIS — Z91199 Patient's noncompliance with other medical treatment and regimen due to unspecified reason: Secondary | ICD-10-CM

## 2013-07-25 DIAGNOSIS — Z7982 Long term (current) use of aspirin: Secondary | ICD-10-CM

## 2013-07-25 DIAGNOSIS — Z7901 Long term (current) use of anticoagulants: Secondary | ICD-10-CM

## 2013-07-25 DIAGNOSIS — R7989 Other specified abnormal findings of blood chemistry: Secondary | ICD-10-CM

## 2013-07-25 LAB — URINALYSIS, ROUTINE W REFLEX MICROSCOPIC
BILIRUBIN URINE: NEGATIVE
Glucose, UA: >1000 mg/dL — AB
HGB URINE DIPSTICK: NEGATIVE
Ketones, ur: 15 mg/dL — AB
Leukocytes, UA: NEGATIVE
NITRITE: NEGATIVE
Protein, ur: 100 mg/dL — AB
SPECIFIC GRAVITY, URINE: 1.03 (ref 1.005–1.030)
Urobilinogen, UA: 0.2 mg/dL (ref 0.0–1.0)
pH: 7 (ref 5.0–8.0)

## 2013-07-25 LAB — POCT I-STAT 3, ART BLOOD GAS (G3+)
Acid-base deficit: 7 mmol/L — ABNORMAL HIGH (ref 0.0–2.0)
Acid-base deficit: 1 mmol/L (ref 0.0–2.0)
BICARBONATE: 21.1 meq/L (ref 20.0–24.0)
Bicarbonate: 25.4 mEq/L — ABNORMAL HIGH (ref 20.0–24.0)
O2 SAT: 100 %
O2 Saturation: 78 %
PH ART: 7.228 — AB (ref 7.350–7.450)
PO2 ART: 182 mmHg — AB (ref 80.0–100.0)
Patient temperature: 98.6
TCO2: 23 mmol/L (ref 0–100)
TCO2: 27 mmol/L (ref 0–100)
pCO2 arterial: 50.5 mmHg — ABNORMAL HIGH (ref 35.0–45.0)
pCO2 arterial: 47.7 mmHg — ABNORMAL HIGH (ref 35.0–45.0)
pH, Arterial: 7.333 — ABNORMAL LOW (ref 7.350–7.450)
pO2, Arterial: 51 mmHg — ABNORMAL LOW (ref 80.0–100.0)

## 2013-07-25 LAB — GLUCOSE, CAPILLARY
GLUCOSE-CAPILLARY: 410 mg/dL — AB (ref 70–99)
GLUCOSE-CAPILLARY: 417 mg/dL — AB (ref 70–99)
Glucose-Capillary: 295 mg/dL — ABNORMAL HIGH (ref 70–99)
Glucose-Capillary: 317 mg/dL — ABNORMAL HIGH (ref 70–99)
Glucose-Capillary: 216 mg/dL — ABNORMAL HIGH (ref 70–99)

## 2013-07-25 LAB — CBC WITH DIFFERENTIAL/PLATELET
BASOS ABS: 0.1 10*3/uL (ref 0.0–0.1)
Basophils Relative: 0 % (ref 0–1)
EOS ABS: 0.1 10*3/uL (ref 0.0–0.7)
EOS PCT: 1 % (ref 0–5)
HCT: 51.2 % (ref 39.0–52.0)
Hemoglobin: 18.2 g/dL — ABNORMAL HIGH (ref 13.0–17.0)
Lymphocytes Relative: 15 % (ref 12–46)
Lymphs Abs: 1.9 10*3/uL (ref 0.7–4.0)
MCH: 30.5 pg (ref 26.0–34.0)
MCHC: 35.5 g/dL (ref 30.0–36.0)
MCV: 85.9 fL (ref 78.0–100.0)
Monocytes Absolute: 0.7 10*3/uL (ref 0.1–1.0)
Monocytes Relative: 6 % (ref 3–12)
Neutro Abs: 9.8 10*3/uL — ABNORMAL HIGH (ref 1.7–7.7)
Neutrophils Relative %: 78 % — ABNORMAL HIGH (ref 43–77)
PLATELETS: 192 10*3/uL (ref 150–400)
RBC: 5.96 MIL/uL — ABNORMAL HIGH (ref 4.22–5.81)
RDW: 13.4 % (ref 11.5–15.5)
WBC: 12.5 10*3/uL — AB (ref 4.0–10.5)

## 2013-07-25 LAB — COMPREHENSIVE METABOLIC PANEL
ALT: 49 U/L (ref 0–53)
AST: 33 U/L (ref 0–37)
Albumin: 3.5 g/dL (ref 3.5–5.2)
Alkaline Phosphatase: 69 U/L (ref 39–117)
BUN: 21 mg/dL (ref 6–23)
CALCIUM: 8.8 mg/dL (ref 8.4–10.5)
CO2: 22 mEq/L (ref 19–32)
Chloride: 99 mEq/L (ref 96–112)
Creatinine, Ser: 0.98 mg/dL (ref 0.50–1.35)
GFR calc Af Amer: >90 mL/min (ref >90)
GFR calc non Af Amer: 88 mL/min — ABNORMAL LOW (ref >90)
Glucose, Bld: 456 mg/dL — ABNORMAL HIGH (ref 70–99)
Potassium: 4.7 mEq/L (ref 3.7–5.3)
SODIUM: 138 meq/L (ref 137–147)
TOTAL PROTEIN: 6.9 g/dL (ref 6.0–8.3)
Total Bilirubin: 0.7 mg/dL (ref 0.3–1.2)

## 2013-07-25 LAB — POCT I-STAT, CHEM 8
BUN: 24 mg/dL — ABNORMAL HIGH (ref 6–23)
CREATININE: 1.1 mg/dL (ref 0.50–1.35)
Calcium, Ion: 1.09 mmol/L — ABNORMAL LOW (ref 1.13–1.30)
Chloride: 105 mEq/L (ref 96–112)
Glucose, Bld: 466 mg/dL — ABNORMAL HIGH (ref 70–99)
HCT: 55 % — ABNORMAL HIGH (ref 39.0–52.0)
HEMOGLOBIN: 18.7 g/dL — AB (ref 13.0–17.0)
POTASSIUM: 4.3 meq/L (ref 3.7–5.3)
Sodium: 136 mEq/L — ABNORMAL LOW (ref 137–147)
TCO2: 24 mmol/L (ref 0–100)

## 2013-07-25 LAB — TROPONIN I
TROPONIN I: 0.36 ng/mL — AB (ref <0.30)
TROPONIN I: 0.79 ng/mL — AB (ref <0.30)
Troponin I: 1.06 ng/mL (ref <0.30)

## 2013-07-25 LAB — MRSA PCR SCREENING: MRSA by PCR: NEGATIVE

## 2013-07-25 LAB — URINE MICROSCOPIC-ADD ON

## 2013-07-25 LAB — CG4 I-STAT (LACTIC ACID): LACTIC ACID, VENOUS: 2.61 mmol/L — AB (ref 0.5–2.2)

## 2013-07-25 LAB — APTT: aPTT: 29 seconds (ref 24–37)

## 2013-07-25 LAB — PRO B NATRIURETIC PEPTIDE: Pro B Natriuretic peptide (BNP): 949.9 pg/mL — ABNORMAL HIGH (ref 0–125)

## 2013-07-25 LAB — POCT I-STAT TROPONIN I: TROPONIN I, POC: 0 ng/mL (ref 0.00–0.08)

## 2013-07-25 MED ORDER — RAMIPRIL 10 MG PO CAPS
10.0000 mg | ORAL_CAPSULE | Freq: Every day | ORAL | Status: DC
Start: 2013-07-25 — End: 2013-07-25

## 2013-07-25 MED ORDER — POTASSIUM CHLORIDE ER 10 MEQ PO TBCR
10.0000 meq | EXTENDED_RELEASE_TABLET | Freq: Two times a day (BID) | ORAL | Status: DC
  Administered 2013-07-25 – 2013-07-28 (×7): 10 meq via ORAL
  Filled 2013-07-25 (×8): qty 1

## 2013-07-25 MED ORDER — FUROSEMIDE 10 MG/ML IJ SOLN
80.0000 mg | Freq: Once | INTRAMUSCULAR | Status: AC
  Administered 2013-07-25: 80 mg via INTRAVENOUS
  Filled 2013-07-25: qty 8

## 2013-07-25 MED ORDER — MORPHINE SULFATE 4 MG/ML IJ SOLN
INTRAMUSCULAR | Status: AC
  Filled 2013-07-25: qty 1

## 2013-07-25 MED ORDER — INSULIN ASPART 100 UNIT/ML ~~LOC~~ SOLN
4.0000 [IU] | Freq: Three times a day (TID) | SUBCUTANEOUS | Status: DC
  Administered 2013-07-25: 4 [IU] via SUBCUTANEOUS
  Administered 2013-07-25: 13:00:00 via SUBCUTANEOUS
  Administered 2013-07-26 – 2013-07-28 (×7): 4 [IU] via SUBCUTANEOUS

## 2013-07-25 MED ORDER — SODIUM CHLORIDE 0.9 % IJ SOLN: 3.0000 mL | INTRAMUSCULAR | Status: DC | PRN

## 2013-07-25 MED ORDER — ONDANSETRON HCL 4 MG/2ML IJ SOLN: 4.0000 mg | Freq: Four times a day (QID) | INTRAMUSCULAR | Status: DC | PRN

## 2013-07-25 MED ORDER — SODIUM CHLORIDE 0.9 % IJ SOLN
3.0000 mL | Freq: Two times a day (BID) | INTRAMUSCULAR | Status: DC
  Administered 2013-07-25 – 2013-07-28 (×4): 3 mL via INTRAVENOUS

## 2013-07-25 MED ORDER — INSULIN ASPART 100 UNIT/ML ~~LOC~~ SOLN
0.0000 [IU] | Freq: Three times a day (TID) | SUBCUTANEOUS | Status: DC
  Administered 2013-07-25: 13:00:00 via SUBCUTANEOUS
  Administered 2013-07-25: 15 [IU] via SUBCUTANEOUS
  Administered 2013-07-26 (×2): 7 [IU] via SUBCUTANEOUS
  Administered 2013-07-26: 4 [IU] via SUBCUTANEOUS
  Administered 2013-07-27: 11 [IU] via SUBCUTANEOUS
  Administered 2013-07-28: 3 [IU] via SUBCUTANEOUS
  Administered 2013-07-28: 4 [IU] via SUBCUTANEOUS

## 2013-07-25 MED ORDER — FUROSEMIDE 10 MG/ML IJ SOLN
40.0000 mg | Freq: Two times a day (BID) | INTRAMUSCULAR | Status: DC
  Administered 2013-07-25 – 2013-07-26 (×2): 40 mg via INTRAVENOUS
  Filled 2013-07-25 (×2): qty 4

## 2013-07-25 MED ORDER — GABAPENTIN 300 MG PO CAPS
300.0000 mg | ORAL_CAPSULE | Freq: Three times a day (TID) | ORAL | Status: DC
  Filled 2013-07-25 (×3): qty 1

## 2013-07-25 MED ORDER — ACETAMINOPHEN 325 MG PO TABS: 650.0000 mg | ORAL_TABLET | ORAL | Status: DC | PRN

## 2013-07-25 MED ORDER — MORPHINE SULFATE 4 MG/ML IJ SOLN
4.0000 mg | Freq: Once | INTRAMUSCULAR | Status: AC
  Administered 2013-07-25: 4 mg via INTRAVENOUS

## 2013-07-25 MED ORDER — INSULIN GLARGINE 100 UNIT/ML ~~LOC~~ SOLN
25.0000 [IU] | Freq: Every day | SUBCUTANEOUS | Status: DC
  Filled 2013-07-25: qty 0.25

## 2013-07-25 MED ORDER — ATORVASTATIN CALCIUM 20 MG PO TABS
20.0000 mg | ORAL_TABLET | Freq: Every day | ORAL | Status: DC
  Administered 2013-07-25 – 2013-07-27 (×3): 20 mg via ORAL
  Filled 2013-07-25 (×4): qty 1

## 2013-07-25 MED ORDER — LORAZEPAM 2 MG/ML IJ SOLN
INTRAMUSCULAR | Status: AC
  Filled 2013-07-25: qty 1

## 2013-07-25 MED ORDER — LORAZEPAM 2 MG/ML IJ SOLN
1.0000 mg | Freq: Once | INTRAMUSCULAR | Status: AC
  Administered 2013-07-25: 1 mg via INTRAVENOUS

## 2013-07-25 MED ORDER — FUROSEMIDE 10 MG/ML IJ SOLN
40.0000 mg | Freq: Four times a day (QID) | INTRAMUSCULAR | Status: DC
  Administered 2013-07-25: 40 mg via INTRAVENOUS
  Filled 2013-07-25 (×4): qty 4

## 2013-07-25 MED ORDER — RIVAROXABAN 20 MG PO TABS
20.0000 mg | ORAL_TABLET | Freq: Every day | ORAL | Status: DC
  Administered 2013-07-25: 20 mg via ORAL
  Filled 2013-07-25 (×3): qty 1

## 2013-07-25 MED ORDER — RAMIPRIL 10 MG PO CAPS
10.0000 mg | ORAL_CAPSULE | Freq: Every day | ORAL | Status: DC
  Administered 2013-07-25 – 2013-07-28 (×4): 10 mg via ORAL
  Filled 2013-07-25 (×4): qty 1

## 2013-07-25 MED ORDER — CILOSTAZOL 100 MG PO TABS
100.0000 mg | ORAL_TABLET | Freq: Two times a day (BID) | ORAL | Status: DC
  Administered 2013-07-25 (×2): 100 mg via ORAL
  Filled 2013-07-25 (×4): qty 1

## 2013-07-25 MED ORDER — SODIUM CHLORIDE 0.9 % IV SOLN: 250.0000 mL | INTRAVENOUS | Status: DC | PRN

## 2013-07-25 MED ORDER — DABIGATRAN ETEXILATE MESYLATE 150 MG PO CAPS: 300.0000 mg | ORAL_CAPSULE | Freq: Two times a day (BID) | ORAL | Status: DC

## 2013-07-25 MED ORDER — INSULIN GLARGINE 100 UNIT/ML ~~LOC~~ SOLN
55.0000 [IU] | Freq: Every day | SUBCUTANEOUS | Status: DC
Start: 2013-07-25 — End: 2013-07-26
  Administered 2013-07-25: 55 [IU] via SUBCUTANEOUS
  Filled 2013-07-25 (×2): qty 0.55

## 2013-07-25 MED ORDER — CARVEDILOL 25 MG PO TABS: 37.5000 mg | ORAL_TABLET | Freq: Two times a day (BID) | ORAL | Status: DC

## 2013-07-25 MED ORDER — ASPIRIN EC 81 MG PO TBEC
81.0000 mg | DELAYED_RELEASE_TABLET | Freq: Every day | ORAL | Status: DC
  Administered 2013-07-25 – 2013-07-27 (×3): 81 mg via ORAL
  Filled 2013-07-25 (×3): qty 1

## 2013-07-25 MED ORDER — DILTIAZEM HCL ER COATED BEADS 240 MG PO CP24: 240.0000 mg | ORAL_CAPSULE | Freq: Every day | ORAL | Status: DC

## 2013-07-25 MED ORDER — INSULIN ASPART 100 UNIT/ML ~~LOC~~ SOLN
0.0000 [IU] | SUBCUTANEOUS | Status: DC
  Administered 2013-07-25: 20 [IU] via SUBCUTANEOUS

## 2013-07-25 MED ORDER — INSULIN ASPART 100 UNIT/ML ~~LOC~~ SOLN
0.0000 [IU] | Freq: Every day | SUBCUTANEOUS | Status: DC
  Administered 2013-07-25 – 2013-07-26 (×2): 2 [IU] via SUBCUTANEOUS

## 2013-07-25 MED ORDER — CARVEDILOL 25 MG PO TABS
37.5000 mg | ORAL_TABLET | Freq: Two times a day (BID) | ORAL | Status: DC
  Administered 2013-07-25 – 2013-07-27 (×5): 37.5 mg via ORAL
  Filled 2013-07-25 (×10): qty 1

## 2013-07-25 NOTE — Progress Notes (Signed)
I agree with the assessment documented by Mavis, student nurse. 

## 2013-07-25 NOTE — Progress Notes (Addendum)
Chart reviewed.   [facility] PROGRESS NOTE  William Pacheco [id number] DOB: Apr 25, 1951 DOA: 07/25/2013 PCP: Dorrene GermanAVBUERE,EDWIN A, MD  Assessment/Plan:  Principal Problem:   Acute respiratory failure secondary to CHF, improved. Now on [place] Active Problems:   Ischemic cardiomyopathy   CAD (coronary artery disease) - CABG x 6   DM type 2, uncontrolled, with neuropathy: increase SSI and lantus   PAF (paroxysmal atrial fibrillation) currently sinus. Had been on xarelto previously. Will resume.    Elevated troponin: no CP. Check EKG and trend.   Atherosclerosis of native arteries of the extremities with intermittent claudication: on pletal and serial ABIs   PAD (peripheral artery disease) - R Fem-Pop, L SFA stent   Hyperlipidemia   Noncompliance   Acute on chronic systolic heart failure secondary to noncompliance and hypertensive urgency.  Pt has been off most meds for several months, due to financial reasons. Lost insurance, but now, apparently insured.  Off bipap. BP better. CBGs still high.  Repeat EKG. Transfer to tele. Await echo and trend troponins. No CP. Elevated troponin May be secondary to CHF, but if echo worse or troponins worse, will consult cardiology.   Code Status:  full Family Communication:  wife Disposition Plan:  home  Consultants:    Procedures:     Antibiotics:    HPI/Subjective: Patient reports that he lost his job, then subsequently his insurance and was unable to pay for most of his medications. He has been off of most of his medications for several months. Recently got insurance back, but will have difficulty with co-pays until he's bringing in more money. Denies chest pain. Currently feeling much better. Had previously been on xarelto, not Pradaxa. Also, had been started on Invokana, and Lyrica in addition to previously listed medications. Discussed with pharmacy.  Objective: Filed Vitals:   07/25/13 0800  BP:   Pulse: 103  Temp:   Resp: 20     Intake/Output Summary (Last 24 hours) at 07/25/13 0857 Last data filed at 07/25/13 0500  Gross per 24 hour  Intake      0 ml  Output   1100 ml  Net  -1100 ml   Filed Weights   07/25/13 0500  Weight: 103.2 kg (227 lb 8.2 oz)   Tele: sinus tach 103  Exam:   General:  Comfortable off bipap  Neck: JVD  Cardiovascular: RRR without MGR  Respiratory: Rales at bases  Abdomen: S, NT, ND  Ext: trace edema. No CC  Basic Metabolic Panel:  Recent Labs Lab 07/25/13 0235 07/25/13 0258  NA 138 136*  K 4.7 4.3  CL 99 105  CO2 22  --   GLUCOSE 456* 466*  BUN 21 24*  CREATININE 0.98 1.10  CALCIUM 8.8  --    Liver Function Tests:  Recent Labs Lab 07/25/13 0235  AST 33  ALT 49  ALKPHOS 69  BILITOT 0.7  PROT 6.9  ALBUMIN 3.5   No results found for this basename: LIPASE, AMYLASE,  in the last 168 hours No results found for this basename: AMMONIA,  in the last 168 hours CBC:  Recent Labs Lab 07/25/13 0235 07/25/13 0258  WBC 12.5*  --   NEUTROABS 9.8*  --   HGB 18.2* 18.7*  HCT 51.2 55.0*  MCV 85.9  --   PLT 192  --    Cardiac Enzymes:  Recent Labs Lab 07/25/13 0605  TROPONINI 0.36*   BNP (last 3 results)  Recent Labs  07/25/13 0235  PROBNP  949.9*   CBG:  Recent Labs Lab 07/25/13 0608 07/25/13 0609  GLUCAP 410* 417*    No results found for this or any previous visit (from the past 240 hour(s)).   Studies: Dg Chest Portable 1 View  07/25/2013   CLINICAL DATA:  Respiratory distress  EXAM: PORTABLE CHEST - 1 VIEW  COMPARISON:  09/24/2011  FINDINGS: Previous median sternotomy and CABG procedure. Mild cardiac enlargement is noted. Diffuse bilateral hazy lung opacities are identified right greater than left. The visualized osseous structures are unremarkable.  IMPRESSION: Moderate severe pulmonary edema.   Electronically Signed   By: Signa Kell M.D.   On: 07/25/2013 01:35    Scheduled Meds: . aspirin EC  81 mg Oral Daily  . atorvastatin   20 mg Oral q1800  . cilostazol  100 mg Oral BID  . furosemide  40 mg Intravenous Q6H  . gabapentin  300 mg Oral TID  . insulin aspart  0-20 Units Subcutaneous Q4H  . insulin glargine  25 Units Subcutaneous QHS  . potassium chloride  10 mEq Oral BID  . sodium chloride  3 mL Intravenous Q12H   Continuous Infusions:   Time spent: 35 minutes  Dulcey Riederer L  [facility] Pager [phone]. If 7PM-7AM, please contact night-coverage at [email or web address], password [facility] 07/25/2013, 8:57 AM  LOS: 0 days

## 2013-07-25 NOTE — Progress Notes (Signed)
Received report from ED RN; assumed care of patient.  Patient arrived to unit via stretcher and is alert and oriented x4.  NAD noted.  Denies pain at this time.  Vitals obtained and skin assessment completed.  No areas of pressure noted.  Plan of care reviewed.  Family at bedside.  Will continue to monitor.

## 2013-07-25 NOTE — H&P (Signed)
[facility] History and Physical  William Pacheco NJA:[id number] DOB: 01-26-1951 DOA: 07/25/2013  Referring physician: EDP PCP: Dorrene German, MD   Chief Complaint: Acute respiratory failure   HPI: William Pacheco is a 63 y.o. male who presents to the ED with respiratory distress.  Symptoms suddenly onset at 1 am this morning without warning.  No chest pain.  Patient presented to the ED essentially in respiratory failure, was placed on rescue BIPAP.  His initial BP was 197/112, after putting the patient on BIPAP and giving him 80 of lasix, his breathing is significantly improved and his BP is down to 116/72.  Hospitalist asked to admit.  Review of Systems: Systems reviewed.  As above, otherwise negative  Past Medical History  Diagnosis Date  . CAD (coronary artery disease), native coronary artery September 2008    Left main 50-60%, LAD 80% with D2 involved. RCA mid occlusion --> CABG  . 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 Ischemic cardiomyopathy September 2008    EF = 35-40%; THE 45-50% IN 2013 --> ECHO 03/03/13 - EF 40-45% LV: Moderate HK of basal-midlateral & inferior myocardium; c/w prior infarction in the distribution of the RCA or LCx. Mild-Mod MR with Calcified annulus. Mod TR  . History of MI (myocardial infarction) 2012    Noted by echocardiography, and nuclear stress testing. Known RCA occlusion prior to CABG  . PAF (paroxysmal atrial fibrillation) - initial presentation with RVR, and heart failure 2013    On a beta blocker, calcium channel blocker & Xarelto. Lexiscan Myoview 11/08/11 LV was enlarged with no evidence of ischemia. There was a moderate sized, moderate to severe in intensity fixed defect in the inferior wall suggestive of scar with no reversible ischemia. There was a basal septal akinesis w/severe global hypokinesis. EF = 31% (Which is different from the [facility] EF)  . Chronic diastolic CHF (congestive heart failure),  NYHA class 2 2009, 2011; 2013    Exacerbated by A. fib RVR, currently on Lasix.  . Peripheral arterial disease 2009, 2011; 2013    2009, 2011; 2013  . Renal artery stenosis, non-flow-limiting   . DM (diabetes mellitus), type 2 with neurological complications     Peripheral neuropathy  . Diabetic peripheral neuropathy associated with type 2 diabetes mellitus   . Hyperlipidemia LDL goal <70     On statin therapy  . Obesity (BMI 30.0-34.9)   . Hypertension associated with diabetes   . Erectile dysfunction associated with type 2 diabetes mellitus  2006  . Ischemic mitral regurgitation  1980s    Mild to moderate  . Thrombocytopenia      chronic  . Osteoarthritis of both knees 1990    Status post left knee a arthroscopy, currently Harding right knee  . History of hepatitis B 1980s  . Adopted     Adopted [J86.27]   Past Surgical History  Procedure Laterality Date  . Femoral endarterectomy  03-15-2008    Left EIA & CFA endarterectomy by Dr. Madilyn Fireman  . Knee arthroscopy Left 1990    For osteoarthritis pain.  . Tonsillectomy    . Vascular surgery    . Pleural effusion drainage Right   . Coronary artery bypass graft  03/2007    CABG x 6 LIMA-LAD, left radial to diagonal 1, SVG to OM1-OM2 sequential, SVG to PDA-PLA sequential.   Social History:  reports that he quit smoking about 8 years ago. His smoking use included  Cigarettes. He started smoking about 40 years ago. He smoked 0.50 packs per day. He has never used smokeless tobacco. He reports that he does not drink alcohol or use illicit drugs.  No Known Allergies  Family History  Problem Relation Age of Onset  . Adopted: Yes  . Cancer Mother      Prior to Admission medications   Medication Sig Start Date End Date Taking? Authorizing Provider  aspirin 81 MG EC tablet Take 81 mg by mouth daily.  03/27/13  Yes Historical Provider, MD  atorvastatin (LIPITOR) 20 MG tablet TAKE 1 TABLET (20 MG TOTAL) BY MOUTH DAILY. 05/08/13  Yes Leonie Man, MD  carvedilol (COREG) 25 MG tablet Take 37.5 mg by mouth 2 (two) times daily with a meal. 1 and 1/2 tablets   Yes Historical Provider, MD  cilostazol (PLETAL) 100 MG tablet TAKE 1 TABLET TWICE A DAY 03/21/13  Yes Leonie Man, MD  dabigatran (PRADAXA) 150 MG CAPS capsule Take 300 mg by mouth 2 (two) times daily.   Yes Historical Provider, MD  diltiazem (CARDIZEM CD) 240 MG 24 hr capsule Take 1 capsule (240 mg total) by mouth daily. 03/15/13  Yes Leonie Man, MD  furosemide (LASIX) 20 MG tablet Take 1 tablet (20 mg total) by mouth daily. 03/15/13  Yes Leonie Man, MD  gabapentin (NEURONTIN) 300 MG capsule Take 300 mg by mouth 3 (three) times daily.   Yes Historical Provider, MD  Glucosamine-Chondroit-Vit C-Mn (GLUCOSAMINE 1500 COMPLEX PO) Take by mouth 2 (two) times daily.   Yes Historical Provider, MD  insulin aspart (NOVOLOG) 100 UNIT/ML injection 30 units total  10 units  Morning  And 20 units evening   Yes Historical Provider, MD  potassium chloride (K-DUR) 10 MEQ tablet Take 1 tablet (10 mEq total) by mouth 2 (two) times daily. 01/16/13  Yes Leonie Man, MD  ramipril (ALTACE) 10 MG capsule TAKE 1 CAPSULE EVERY DAY 01/21/13  Yes Leonie Man, MD  Blood Glucose Monitoring Suppl (ONE TOUCH ULTRA MINI) W/DEVICE KIT  01/15/13   Historical Provider, MD  insulin glargine (LANTUS) 100 UNIT/ML injection Inject 55 Units into the skin daily.  09/25/11 03/12/13  Cecilie Kicks, NP   Physical Exam: Filed Vitals:   07/25/13 0300  BP: 116/72  Pulse: 115  Temp: 97.5 F (36.4 C)  Resp: 23    BP 116/72  Pulse 115  Temp(Src) 97.5 F (36.4 C) (Core (Comment))  Resp 23  SpO2 99%  General Appearance:    Alert, oriented, no distress, appears stated age  Head:    Normocephalic, atraumatic  Eyes:    PERRL, EOMI, sclera non-icteric        Nose:   Nares without drainage or epistaxis. Mucosa, turbinates normal  Throat:   Moist mucous membranes. Oropharynx without erythema or exudate.   Neck:   Supple. No carotid bruits.  No thyromegaly.  No lymphadenopathy.   Back:     No CVA tenderness, no spinal tenderness  Lungs:     Crackles bilaterally  Chest wall:    No tenderness to palpitation  Heart:    Regular rate and rhythm without murmurs, gallops, rubs  Abdomen:     Soft, non-tender, nondistended, normal bowel sounds, no organomegaly  Genitalia:    deferred  Rectal:    deferred  Extremities:   No clubbing, cyanosis or edema.  Pulses:   2+ and symmetric all extremities  Skin:   Skin color, texture, turgor normal, no  rashes or lesions  Lymph nodes:   Cervical, supraclavicular, and axillary nodes normal  Neurologic:   CNII-XII intact. Normal strength, sensation and reflexes      throughout    Labs on Admission:  Basic Metabolic Panel:  Recent Labs Lab 07/25/13 0235 07/25/13 0258  NA 138 136*  K 4.7 4.3  CL 99 105  CO2 22  --   GLUCOSE 456* 466*  BUN 21 24*  CREATININE 0.98 1.10  CALCIUM 8.8  --    Liver Function Tests:  Recent Labs Lab 07/25/13 0235  AST 33  ALT 49  ALKPHOS 69  BILITOT 0.7  PROT 6.9  ALBUMIN 3.5   No results found for this basename: LIPASE, AMYLASE,  in the last 168 hours No results found for this basename: AMMONIA,  in the last 168 hours CBC:  Recent Labs Lab 07/25/13 0235 07/25/13 0258  WBC 12.5*  --   NEUTROABS 9.8*  --   HGB 18.2* 18.7*  HCT 51.2 55.0*  MCV 85.9  --   PLT 192  --    Cardiac Enzymes: No results found for this basename: CKTOTAL, CKMB, CKMBINDEX, TROPONINI,  in the last 168 hours  BNP (last 3 results)  Recent Labs  07/25/13 0235  PROBNP 949.9*   CBG: No results found for this basename: GLUCAP,  in the last 168 hours  Radiological Exams on Admission: Dg Chest Portable 1 View  07/25/2013   CLINICAL DATA:  Respiratory distress  EXAM: PORTABLE CHEST - 1 VIEW  COMPARISON:  09/24/2011  FINDINGS: Previous median sternotomy and CABG procedure. Mild cardiac enlargement is noted. Diffuse bilateral hazy  lung opacities are identified right greater than left. The visualized osseous structures are unremarkable.  IMPRESSION: Moderate severe pulmonary edema.   Electronically Signed   By: Kerby Moors M.D.   On: 07/25/2013 01:35    EKG: Independently reviewed.  Assessment/Plan Principal Problem:   Flash pulmonary edema Active Problems:   CAD (coronary artery disease) - CABG x 6   DM (diabetes mellitus), type 2, uncontrolled; On [place]   Ischemic cardiomyopathy   Acute respiratory failure   1. Flash pulmonary edema - acute on chronic systolic CHF - symptoms improved on BIPAP, on heart failure pathway, checking serial troponins to rule out MI although initial troponin was negative and he reports no chest pain.  Aggressive diuresis with lasix 40 iv Q6H.  Wean off BIPAP if tolerated (RRT going to try this now). 2. Ischemic cardiomyopathy - previous EF 30%, h/o 6 vessel CABG in past, no chest pain this time but monitor troponins closely as this flash pulmonary edema certainly could be a presentation of ACS. 3. DM2 - takes lantus 55 at home and novolog 2 times a day, will switch him to lantus 25 qhs, and resistant dose SSI while he remains NPO initially (with BIPAP on).    Code Status: Full Code  Family Communication: Family at bedside Disposition Plan: Admit to inpatient   Time spent: 70 min  Santana Gosdin M. [facility] Pager [phone]  If 7AM-7PM, please contact the day team taking care of the patient [email or web address] Password [facility] 07/25/2013, 3:57 AM

## 2013-07-25 NOTE — Progress Notes (Signed)
Daphane Shepherd notified of patient CBG 417.  Will administer sliding scale per Dr. Burnadette Peter.

## 2013-07-25 NOTE — Progress Notes (Signed)
Client has increase in Troponin's and on call MD notified.  No new orders given.  Client denies SOB or chest pain.

## 2013-07-25 NOTE — Progress Notes (Signed)
ANTICOAGULATION CONSULT NOTE - Initial Consult  Pharmacy Consult for Xarelto Indication: nonvalvular afib  No Known Allergies  Patient Measurements: Height: 5\' 11"  (180.3 cm) Weight: 227 lb 8.2 oz (103.2 kg) IBW/kg (Calculated) : 75.3  Vital Signs: Temp: 97.3 F (36.3 C) (01/24 0700) Temp src: Axillary (01/24 0700) BP: 128/80 mmHg (01/24 0700) Pulse Rate: 103 (01/24 0800)  Labs:  Recent Labs  07/25/13 0235 07/25/13 0258 07/25/13 0605  HGB 18.2* 18.7*  --   HCT 51.2 55.0*  --   PLT 192  --   --   APTT 29  --   --   CREATININE 0.98 1.10  --   TROPONINI  --   --  0.36*    Estimated Creatinine Clearance: 87.4 ml/min (by C-G formula based on Cr of 1.1).   Medical History: Past Medical History  Diagnosis Date  . CAD (coronary artery disease), native coronary artery September 2008    Left main 50-60%, LAD 80% with D2 involved. RCA mid occlusion --> CABG  . 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 Ischemic cardiomyopathy September 2008    EF = 35-40%; THE 45-50% IN 2013 --> ECHO 03/03/13 - EF 40-45% LV: Moderate HK of basal-midlateral & inferior myocardium; c/w prior infarction in the distribution of the RCA or LCx. Mild-Mod MR with Calcified annulus. Mod TR  . History of MI (myocardial infarction) 2012    Noted by echocardiography, and nuclear stress testing. Known RCA occlusion prior to CABG  . PAF (paroxysmal atrial fibrillation) - initial presentation with RVR, and heart failure 2013    On a beta blocker, calcium channel blocker & Xarelto. Lexiscan Myoview 11/08/11 LV was enlarged with no evidence of ischemia. There was a moderate sized, moderate to severe in intensity fixed defect in the inferior wall suggestive of scar with no reversible ischemia. There was a basal septal akinesis w/severe global hypokinesis. EF = 31% ([facility]  . Chronic diastolic CHF (congestive heart failure), NYHA class 2 2009,  2011; 2013    Exacerbated by A. fib RVR, currently on Lasix.  . Peripheral arterial disease 2009, 2011; 2013    2009, 2011; 2013  . Renal artery stenosis, non-flow-limiting   . DM (diabetes mellitus), type 2 with neurological complications     Peripheral neuropathy  . Diabetic peripheral neuropathy associated with type 2 diabetes mellitus   . Hyperlipidemia LDL goal <70     On statin therapy  . Obesity (BMI 30.0-34.9)   . Hypertension associated with diabetes   . Erectile dysfunction associated with type 2 diabetes mellitus  2006  . Ischemic mitral regurgitation  1980s    Mild to moderate  . Thrombocytopenia      chronic  . Osteoarthritis of both knees 1990    Status post left knee a arthroscopy, currently Harding right knee  . History of hepatitis B 1980s  . Adopted     Adopted [V68.89]    Medications:  Scheduled:  . aspirin EC  81 mg Oral Daily  . atorvastatin  20 mg Oral q1800  . carvedilol  37.5 mg Oral BID WC  . cilostazol  100 mg Oral BID  . furosemide  40 mg Intravenous BID  . insulin aspart  0-20 Units Subcutaneous TID WC  . insulin aspart  0-5 Units Subcutaneous QHS  . insulin aspart  4 Units Subcutaneous TID WC  . insulin glargine  55 Units Subcutaneous  QHS  . potassium chloride  10 mEq Oral BID  . ramipril  10 mg Oral Daily  . sodium chloride  3 mL Intravenous Q12H   Assessment: 63 yo AAM presented to the ED with respiratory distress.  Patient has extensive cardiac history including nonvalvular paroxysmal afib, CAD, CABG x6, hx MI, chronic diastolic HF, and PAD.  Upon admission there was some confusion as to which anticoagulant the patient was taking PTA and how much (Pradaxa vs. Xarelto).  When further investigated, it appears the patient has been non-compliant on meds for some time, but does have a bag of medications that include a bottle of Xarelto 20mg  dosed daily.  Pharmacy has been consulted to dose Xarelto inpatient.  I confirmed that he has not taken any  home meds today, and he reports no recent bleeding issues.    Goal of Therapy:  Anticoagulation with Xarelto PO Monitor platelets by anticoagulation protocol: Yes   Plan:  - Xarelto 20mg  every evening with supper - monitor for s/s of bleeding  Harrold Donath E. Achilles Dunk, PharmD Clinical Pharmacist - Resident Pager: [phone] Pharmacy: [phone] 07/25/2013 9:43 AM

## 2013-07-25 NOTE — ED Provider Notes (Signed)
CSN: [id number]     Arrival date & time 07/25/13  0110 History   First MD Initiated Contact with Patient 07/25/13 0127     Chief Complaint  Patient presents with  . Respiratory Distress   (Consider location/radiation/quality/duration/timing/severity/associated sxs/prior Treatment) HPI  This patient is a 63 yo man BIB EMS from home with abrupt onset of severe SOB. Wife gives history and says that patient got out of bed to ambulate to the bathroom and came back to the bedroom stating that he couldn't breath. He had been doing well previously in the evening and day.   The patient has ischemic cardiomyopathy with an EF in the 30% range along with diastolic heart failure. Please see PMH for details of comorbidities. Patient is unable to give hx due to degree of respiratory distress. But, he is able to shake his head and indicated that he has not had chest pain.   Wife says that the patient seemed fine throughout the day yesterday and before going to bed. He has had a single previous episode of respiratory distress which was caused by pulmonary edema. But, this episode is much worse than previous.   No recent med changes.   Paramedics report that BP was 200/112 in the field. The patient received supplemental oxygen en route.   Past Medical History  Diagnosis Date  . CAD (coronary artery disease), native coronary artery September 2008    Left main 50-60%, LAD 80% with D2 involved. RCA mid occlusion --> CABG  . 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 Ischemic cardiomyopathy September 2008    EF = 35-40%; THE 45-50% IN 2013 --> ECHO 03/03/13 - EF 40-45% LV: Moderate HK of basal-midlateral & inferior myocardium; c/w prior infarction in the distribution of the RCA or LCx. Mild-Mod MR with Calcified annulus. Mod TR  . History of MI (myocardial infarction) 2012    Noted by echocardiography, and nuclear stress testing. Known RCA occlusion prior to CABG  . PAF  (paroxysmal atrial fibrillation) - initial presentation with RVR, and heart failure 2013    On a beta blocker, calcium channel blocker & Xarelto. Lexiscan Myoview 11/08/11 LV was enlarged with no evidence of ischemia. There was a moderate sized, moderate to severe in intensity fixed defect in the inferior wall suggestive of scar with no reversible ischemia. There was a basal septal akinesis w/severe global hypokinesis. EF = 31% (Which is different from the [facility] EF)  . Chronic diastolic CHF (congestive heart failure), NYHA class 2 2009, 2011; 2013    Exacerbated by A. fib RVR, currently on Lasix.  . Peripheral arterial disease 2009, 2011; 2013    2009, 2011; 2013  . Renal artery stenosis, non-flow-limiting   . DM (diabetes mellitus), type 2 with neurological complications     Peripheral neuropathy  . Diabetic peripheral neuropathy associated with type 2 diabetes mellitus   . Hyperlipidemia LDL goal <70     On statin therapy  . Obesity (BMI 30.0-34.9)   . Hypertension associated with diabetes   . Erectile dysfunction associated with type 2 diabetes mellitus  2006  . Ischemic mitral regurgitation  1980s    Mild to moderate  . Thrombocytopenia      chronic  . Osteoarthritis of both knees 1990    Status post left knee a arthroscopy, currently [place] right knee  . History of hepatitis B 1980s  . Adopted     Adopted [V68.89]   Past  Surgical History  Procedure Laterality Date  . Femoral endarterectomy  03-15-2008    Left EIA & CFA endarterectomy by Dr. Amedeo Plenty  . Knee arthroscopy Left 1990    For osteoarthritis pain.  . Tonsillectomy    . Vascular surgery    . Pleural effusion drainage Right   . Coronary artery bypass graft  03/2007    CABG x 6 LIMA-LAD, left radial to diagonal 1, SVG to OM1-OM2 sequential, SVG to PDA-PLA sequential.   Family History  Problem Relation Age of Onset  . Adopted: Yes  . Cancer Mother    History  Substance Use Topics  . Smoking status: Former Smoker --  0.50 packs/day    Types: Cigarettes    Start date: 07/02/1973    Quit date: 07/02/2005  . Smokeless tobacco: Never Used  . Alcohol Use: No    Review of Systems  Unable to obtain from the patient because of respiratory failure.    Allergies  Review of patient's allergies indicates no known allergies.  Home Medications   Current Outpatient Rx  Name  Route  Sig  Dispense  Refill  . aspirin 81 MG EC tablet               . aspirin 81 MG tablet   Oral   Take 1 tablet (81 mg total) by mouth daily.           New dose   . atorvastatin (LIPITOR) 20 MG tablet      TAKE 1 TABLET (20 MG TOTAL) BY MOUTH DAILY.   30 tablet   6   . Blood Glucose Monitoring Suppl (ONE TOUCH ULTRA MINI) W/DEVICE KIT               . carvedilol (COREG) 25 MG tablet      TAKE 1 AND 1/2 TABLETS TWICE DAILY   90 tablet   2   . cilostazol (PLETAL) 100 MG tablet      TAKE 1 TABLET TWICE A DAY   60 tablet   11   . diltiazem (CARDIZEM CD) 240 MG 24 hr capsule   Oral   Take 1 capsule (240 mg total) by mouth daily.   30 capsule   11   . furosemide (LASIX) 20 MG tablet   Oral   Take 1 tablet (20 mg total) by mouth daily.   30 tablet   11   . gabapentin (NEURONTIN) 300 MG capsule   Oral   Take 300 mg by mouth 3 (three) times daily.         . Glucosamine-Chondroit-Vit C-Mn (GLUCOSAMINE 1500 COMPLEX PO)   Oral   Take by mouth 2 (two) times daily.         . insulin aspart (NOVOLOG) 100 UNIT/ML injection      30 units total  10 units  Morning  And 20 units evening         . EXPIRED: insulin glargine (LANTUS) 100 UNIT/ML injection   Subcutaneous   Inject 60 Units into the skin daily.         . INVOKANA 100 MG TABS               . KLOR-CON M10 10 MEQ tablet   Oral   Take 10 mEq by mouth daily.         Marland Kitchen LANTUS SOLOSTAR 100 UNIT/ML SOPN               . LYRICA 100 MG capsule               .  LYRICA 75 MG capsule      75 mg 3 (three) times daily.           Marland Kitchen NOVOLOG FLEXPEN 100 UNIT/ML SOPN FlexPen               . nystatin-triamcinolone (MYCOLOG II) cream               . ONE TOUCH ULTRA TEST test strip               . potassium chloride (K-DUR) 10 MEQ tablet   Oral   Take 1 tablet (10 mEq total) by mouth 2 (two) times daily.   60 tablet   3   . ramipril (ALTACE) 10 MG capsule      TAKE 1 CAPSULE EVERY DAY   30 capsule   3   . Rivaroxaban (XARELTO) 20 MG TABS   Oral   Take 1 tablet (20 mg total) by mouth daily.   30 tablet   6    BP 196/112  Pulse 136  SpO2 92% Physical Exam Gen: well developed and well nourished appearing, appears to be in severe respiratory distress.  Head: NCAT Eyes: PERL, EOMI Nose: no epistaixis or rhinorrhea Mouth/throat: mucosa is moist and pink Neck: supple, no jvd appreciated.  Lungs: RR 48/min and shallow, diffuse rales throughout all lung fields, accessory muscle use present.  CV: rapid and regular, pulse around 140 bpm, no murmur, extremities appear well perfused.  Abd: soft, notender, nondistended, obese Back: normal to inspection Skin: warm and dry Ext: normal to inspection, no dependent edema Neuro: CN ii-xii grossly intact, no focal deficits Psyche; normal affect,  calm and cooperative.   ED Course  Procedures (including critical care time) Labs Review  Results for orders placed during the hospital encounter of 07/25/13 (from the past 24 hour(s))  POCT I-STAT 3, BLOOD GAS (G3+)     Status: Abnormal   Collection Time    07/25/13  1:26 AM      Result Value Range   pH, Arterial 7.228 (*) 7.350 - 7.450   pCO2 arterial 50.5 (*) 35.0 - 45.0 mmHg   pO2, Arterial 51.0 (*) 80.0 - 100.0 mmHg   Bicarbonate 21.1  20.0 - 24.0 mEq/L   TCO2 23  0 - 100 mmol/L   O2 Saturation 78.0     Acid-base deficit 7.0 (*) 0.0 - 2.0 mmol/L   Patient temperature 98.6 F     Collection site RADIAL, ALLEN'S TEST ACCEPTABLE     Drawn by RT     Sample type ARTERIAL    URINALYSIS, ROUTINE W  REFLEX MICROSCOPIC     Status: Abnormal   Collection Time    07/25/13  1:34 AM      Result Value Range   Color, Urine YELLOW  YELLOW   APPearance CLEAR  CLEAR   Specific Gravity, Urine 1.030  1.005 - 1.030   pH 7.0  5.0 - 8.0   Glucose, UA >1000 (*) NEGATIVE mg/dL   Hgb urine dipstick NEGATIVE  NEGATIVE   Bilirubin Urine NEGATIVE  NEGATIVE   Ketones, ur 15 (*) NEGATIVE mg/dL   Protein, ur 100 (*) NEGATIVE mg/dL   Urobilinogen, UA 0.2  0.0 - 1.0 mg/dL   Nitrite NEGATIVE  NEGATIVE   Leukocytes, UA NEGATIVE  NEGATIVE  URINE MICROSCOPIC-ADD ON     Status: Abnormal   Collection Time    07/25/13  1:34 AM      Result Value Range   WBC,  UA 0-2  <3 WBC/hpf   RBC / HPF 0-2  <3 RBC/hpf   Bacteria, UA RARE  RARE   Casts HYALINE CASTS (*) NEGATIVE   Urine-Other SPERM PRESENT    PRO B NATRIURETIC PEPTIDE     Status: Abnormal   Collection Time    07/25/13  2:35 AM      Result Value Range   Pro B Natriuretic peptide (BNP) 949.9 (*) 0 - 125 pg/mL  CBC WITH DIFFERENTIAL     Status: Abnormal   Collection Time    07/25/13  2:35 AM      Result Value Range   WBC 12.5 (*) 4.0 - 10.5 K/uL   RBC 5.96 (*) 4.22 - 5.81 MIL/uL   Hemoglobin 18.2 (*) 13.0 - 17.0 g/dL   HCT 51.2  39.0 - 52.0 %   MCV 85.9  78.0 - 100.0 fL   MCH 30.5  26.0 - 34.0 pg   MCHC 35.5  30.0 - 36.0 g/dL   RDW 13.4  11.5 - 15.5 %   Platelets 192  150 - 400 K/uL   Neutrophils Relative % 78 (*) 43 - 77 %   Neutro Abs 9.8 (*) 1.7 - 7.7 K/uL   Lymphocytes Relative 15  12 - 46 %   Lymphs Abs 1.9  0.7 - 4.0 K/uL   Monocytes Relative 6  3 - 12 %   Monocytes Absolute 0.7  0.1 - 1.0 K/uL   Eosinophils Relative 1  0 - 5 %   Eosinophils Absolute 0.1  0.0 - 0.7 K/uL   Basophils Relative 0  0 - 1 %   Basophils Absolute 0.1  0.0 - 0.1 K/uL  COMPREHENSIVE METABOLIC PANEL     Status: Abnormal   Collection Time    07/25/13  2:35 AM      Result Value Range   Sodium 138  137 - 147 mEq/L   Potassium 4.7  3.7 - 5.3 mEq/L   Chloride 99   96 - 112 mEq/L   CO2 22  19 - 32 mEq/L   Glucose, Bld 456 (*) 70 - 99 mg/dL   BUN 21  6 - 23 mg/dL   Creatinine, Ser 0.98  0.50 - 1.35 mg/dL   Calcium 8.8  8.4 - 10.5 mg/dL   Total Protein 6.9  6.0 - 8.3 g/dL   Albumin 3.5  3.5 - 5.2 g/dL   AST 33  0 - 37 U/L   ALT 49  0 - 53 U/L   Alkaline Phosphatase 69  39 - 117 U/L   Total Bilirubin 0.7  0.3 - 1.2 mg/dL   GFR calc non Af Amer 88 (*) >90 mL/min   GFR calc Af Amer >90  >90 mL/min  APTT     Status: None   Collection Time    07/25/13  2:35 AM      Result Value Range   aPTT 29  24 - 37 seconds  POCT I-STAT TROPONIN I     Status: None   Collection Time    07/25/13  2:55 AM      Result Value Range   Troponin i, poc 0.00  0.00 - 0.08 ng/mL   Comment 3           POCT I-STAT, CHEM 8     Status: Abnormal   Collection Time    07/25/13  2:58 AM      Result Value Range   Sodium 136 (*) 137 - 147 mEq/L  Potassium 4.3  3.7 - 5.3 mEq/L   Chloride 105  96 - 112 mEq/L   BUN 24 (*) 6 - 23 mg/dL   Creatinine, Ser 1.10  0.50 - 1.35 mg/dL   Glucose, Bld 466 (*) 70 - 99 mg/dL   Calcium, Ion 1.09 (*) 1.13 - 1.30 mmol/L   TCO2 24  0 - 100 mmol/L   Hemoglobin 18.7 (*) 13.0 - 17.0 g/dL   HCT 55.0 (*) 39.0 - 52.0 %  CG4 I-STAT (LACTIC ACID)     Status: Abnormal   Collection Time    07/25/13  2:59 AM      Result Value Range   Lactic Acid, Venous 2.61 (*) 0.5 - 2.2 mmol/L  POCT I-STAT 3, BLOOD GAS (G3+)     Status: Abnormal   Collection Time    07/25/13  3:29 AM      Result Value Range   pH, Arterial 7.333 (*) 7.350 - 7.450   pCO2 arterial 47.7 (*) 35.0 - 45.0 mmHg   pO2, Arterial 182.0 (*) 80.0 - 100.0 mmHg   Bicarbonate 25.4 (*) 20.0 - 24.0 mEq/L   TCO2 27  0 - 100 mmol/L   O2 Saturation 100.0     Acid-base deficit 1.0  0.0 - 2.0 mmol/L   Patient temperature 36.63 C     Collection site RADIAL, ALLEN'S TEST ACCEPTABLE     Drawn by RT     Sample type ARTERIAL        DG Chest Portable 1 View (Final result)  Result time: 07/25/13  01:35:51    Final result by Rad Results In Interface (07/25/13 01:35:51)    Narrative:   CLINICAL DATA: Respiratory distress  EXAM: PORTABLE CHEST - 1 VIEW  COMPARISON: 09/24/2011  FINDINGS: Previous median sternotomy and CABG procedure. Mild cardiac enlargement is noted. Diffuse bilateral hazy lung opacities are identified right greater than left. The visualized osseous structures are unremarkable.  IMPRESSION: Moderate severe pulmonary edema.   Electronically Signed By: Kerby Moors M.D. On: 07/25/2013 01:35   EKG: sinus tach, rate 136 bpm, normal axis, normal intervals, LVH pattern, non specific T wave abnormalities.   CRITICAL CARE Performed by: Elyn Peers   Total critical care time: 79m  Critical care time was exclusive of separately billable procedures and treating other patients.  Critical care was necessary to treat or prevent imminent or life-threatening deterioration.  Critical care was time spent personally by me on the following activities: development of treatment plan with patient and/or surrogate as well as nursing, discussions with consultants, evaluation of patient's response to treatment, examination of patient, obtaining history from patient or surrogate, ordering and performing treatments and interventions, ordering and review of laboratory studies, ordering and review of radiographic studies, pulse oximetry and re-evaluation of patient's condition.   MDM   0240: Patient re-evaluated. Able to speak now with BiPAP mask on. Says he is feeling much better. RR now around 24/min, BP has normalized without intervention other than lasix. The patient has diuresed approximately 300cc of clear urine.   0330:  Case discussed with Dr. Fabio Neighbors who will see and evaluate for admission to the SDU.   Patient presented with hypertensive crisis and flash pulmonary edema. Now much improved. We will attempt to wean from BiPAP. BP remains good.    Elyn Peers,  MD 07/25/13 [phone]

## 2013-07-25 NOTE — ED Notes (Signed)
Pt presents with sudden onset SOB

## 2013-07-25 NOTE — Progress Notes (Signed)
Chaplain was called to provide support for wife of pt who presented with SOB. Pt's wife was with him in Trauma C, having accompanied him in the ambulance. She said pt had had bypass surgery here several hears ago. Chaplain provided emotional and spiritual support for pt's wife while medical staff was attending to pt. We had prayer together. Pt's wife expressed appreciation for my presence.

## 2013-07-25 NOTE — ED Notes (Addendum)
Dr Lavella Lemons given a copy of lactic acid 2.61

## 2013-07-26 DIAGNOSIS — I70219 Atherosclerosis of native arteries of extremities with intermittent claudication, unspecified extremity: Secondary | ICD-10-CM

## 2013-07-26 DIAGNOSIS — Z9119 Patient's noncompliance with other medical treatment and regimen: Secondary | ICD-10-CM

## 2013-07-26 DIAGNOSIS — I5023 Acute on chronic systolic (congestive) heart failure: Principal | ICD-10-CM

## 2013-07-26 DIAGNOSIS — I059 Rheumatic mitral valve disease, unspecified: Secondary | ICD-10-CM

## 2013-07-26 DIAGNOSIS — Z91199 Patient's noncompliance with other medical treatment and regimen due to unspecified reason: Secondary | ICD-10-CM

## 2013-07-26 DIAGNOSIS — E1142 Type 2 diabetes mellitus with diabetic polyneuropathy: Secondary | ICD-10-CM

## 2013-07-26 DIAGNOSIS — E1149 Type 2 diabetes mellitus with other diabetic neurological complication: Secondary | ICD-10-CM

## 2013-07-26 DIAGNOSIS — I252 Old myocardial infarction: Secondary | ICD-10-CM | POA: Diagnosis present

## 2013-07-26 DIAGNOSIS — R799 Abnormal finding of blood chemistry, unspecified: Secondary | ICD-10-CM

## 2013-07-26 HISTORY — PX: TRANSTHORACIC ECHOCARDIOGRAM: SHX275

## 2013-07-26 LAB — BASIC METABOLIC PANEL
BUN: 31 mg/dL — ABNORMAL HIGH (ref 6–23)
CO2: 23 meq/L (ref 19–32)
Calcium: 8.9 mg/dL (ref 8.4–10.5)
Chloride: 99 mEq/L (ref 96–112)
Creatinine, Ser: 1.02 mg/dL (ref 0.50–1.35)
GFR calc Af Amer: >90 mL/min (ref >90)
GFR calc non Af Amer: 78 mL/min — ABNORMAL LOW (ref >90)
GLUCOSE: 204 mg/dL — AB (ref 70–99)
Potassium: 4.1 mEq/L (ref 3.7–5.3)
Sodium: 137 mEq/L (ref 137–147)

## 2013-07-26 LAB — GLUCOSE, CAPILLARY
Glucose-Capillary: 170 mg/dL — ABNORMAL HIGH (ref 70–99)
Glucose-Capillary: 208 mg/dL — ABNORMAL HIGH (ref 70–99)
Glucose-Capillary: 250 mg/dL — ABNORMAL HIGH (ref 70–99)
Glucose-Capillary: 208 mg/dL — ABNORMAL HIGH (ref 70–99)

## 2013-07-26 LAB — TROPONIN I: Troponin I: 1 ng/mL (ref <0.30)

## 2013-07-26 LAB — HEPARIN LEVEL (UNFRACTIONATED)

## 2013-07-26 MED ORDER — SODIUM CHLORIDE 0.9 % IJ SOLN: 3.0000 mL | INTRAMUSCULAR | Status: DC | PRN

## 2013-07-26 MED ORDER — HEPARIN (PORCINE) IN NACL 100-0.45 UNIT/ML-% IJ SOLN
1400.0000 [IU]/h | INTRAMUSCULAR | Status: DC
  Administered 2013-07-26: 1300 [IU]/h via INTRAVENOUS
  Administered 2013-07-27 (×2): 1400 [IU]/h via INTRAVENOUS
  Filled 2013-07-26 (×2): qty 250

## 2013-07-26 MED ORDER — ASPIRIN 81 MG PO CHEW
81.0000 mg | CHEWABLE_TABLET | ORAL | Status: AC
  Administered 2013-07-27: 81 mg via ORAL
  Filled 2013-07-26: qty 1

## 2013-07-26 MED ORDER — DIAZEPAM 5 MG PO TABS: 5.0000 mg | ORAL_TABLET | ORAL | Status: AC

## 2013-07-26 MED ORDER — SODIUM CHLORIDE 0.9 % IJ SOLN
3.0000 mL | Freq: Two times a day (BID) | INTRAMUSCULAR | Status: DC
  Administered 2013-07-26 – 2013-07-27 (×3): 3 mL via INTRAVENOUS

## 2013-07-26 MED ORDER — METOPROLOL TARTRATE 1 MG/ML IV SOLN
5.0000 mg | Freq: Once | INTRAVENOUS | Status: AC
  Administered 2013-07-26: 5 mg via INTRAVENOUS
  Filled 2013-07-26: qty 5

## 2013-07-26 MED ORDER — INSULIN GLARGINE 100 UNIT/ML ~~LOC~~ SOLN
28.0000 [IU] | Freq: Every day | SUBCUTANEOUS | Status: DC
  Administered 2013-07-26 – 2013-07-27 (×2): 28 [IU] via SUBCUTANEOUS
  Filled 2013-07-26 (×3): qty 0.28

## 2013-07-26 MED ORDER — FUROSEMIDE 40 MG PO TABS
40.0000 mg | ORAL_TABLET | Freq: Two times a day (BID) | ORAL | Status: AC
  Administered 2013-07-26 – 2013-07-27 (×3): 40 mg via ORAL
  Filled 2013-07-26 (×4): qty 1

## 2013-07-26 MED ORDER — SODIUM CHLORIDE 0.9 % IV SOLN: 250.0000 mL | INTRAVENOUS | Status: DC | PRN

## 2013-07-26 NOTE — Progress Notes (Addendum)
[facility] PROGRESS NOTE  William Pacheco William Pacheco UJW:[id number]:[id number] DOB: 1950-09-07 DOA: 07/25/2013 PCP: William GermanAVBUERE,EDWIN A, MD  Assessment/Plan:    Acute respiratory failure secondary to CHF, much improved. Wean oxygen and increase activity   Acute on chronic systolic heart failure secondary to noncompliance and hypertensive urgency. >4.5 L negative.  Change to PO lasix    Ischemic cardiomyopathy: await echo    CAD (coronary artery disease) - CABG x 6: troponins increased further overnight, but no CP. Will consult cardiology. Per Dr. Elissa HeftyHarding's last note, considering myoview or cath.  Repeat EKG ok. On ASA, beta blocker. Discussed with Dr. Anne FuSkains who recommends holding xarelto, stopping Pletal and starting heparin drip. May need cardiac catheterization.    DM type 2, uncontrolled, with neuropathy: improved    PAF (paroxysmal atrial fibrillation) currently sinus. Had been on xarelto previously.     Elevated troponin: see above    Atherosclerosis of native arteries of the extremities with intermittent claudication: on cilostazol and serial ABIs. Previously, patient had been on cilostazol, xarelto and aspirin. Would likely be okay with xarelto alone. Defer to cardiology.    PAD (peripheral artery disease) - R Fem-Pop, L SFA stent    Hyperlipidemia back on statin    Noncompliance: Would simplify medication regimen as much as possible, as patient is concerned about even being able to afford co-pays.  Code Status:  full Family Communication:  Wife 1/24 Disposition Plan:  home  Consultants:  cardiology  Procedures:     Antibiotics:    HPI/Subjective: Breathing back to baseline. No chest pain or palpitations. Has not ambulated much. Wants to take off his nasal cannula oxygen. Wondering when he can go home.  Objective: Filed Vitals:   07/26/13 0508  BP: 116/66  Pulse: 93  Temp: 97.7 F (36.5 C)  Resp: 18    Intake/Output Summary (Last 24 hours) at 07/26/13 0819 Last data  filed at 07/25/13 2156  Gross per 24 hour  Intake   1185 ml  Output   4600 ml  Net  -3415 ml   Filed Weights   07/25/13 0500 07/25/13 1239 07/26/13 0508  Weight: 103.2 kg (227 lb 8.2 oz) 100.381 kg (221 lb 4.8 oz) 102.059 kg (225 lb)   Tele: [facility] fib last night.  Exam:   General:  Comfortable  Cardiovascular: RRR without MGR  Respiratory: Minimal rales at right base no wheezes or rhonchi  Abdomen: S, NT, ND  Ext: trace edema. No CC  Basic Metabolic Panel:  Recent Labs Lab 07/25/13 0235 07/25/13 0258 07/26/13 0545  NA 138 136* 137  K 4.7 4.3 4.1  CL 99 105 99  CO2 22  --  23  GLUCOSE 456* 466* 204*  BUN 21 24* 31*  CREATININE 0.98 1.10 1.02  CALCIUM 8.8  --  8.9   Liver Function Tests:  Recent Labs Lab 07/25/13 0235  AST 33  ALT 49  ALKPHOS 69  BILITOT 0.7  PROT 6.9  ALBUMIN 3.5   No results found for this basename: LIPASE, AMYLASE,  in the last 168 hours No results found for this basename: AMMONIA,  in the last 168 hours CBC:  Recent Labs Lab 07/25/13 0235 07/25/13 0258  WBC 12.5*  --   NEUTROABS 9.8*  --   HGB 18.2* 18.7*  HCT 51.2 55.0*  MCV 85.9  --   PLT 192  --    Cardiac Enzymes:  Recent Labs Lab 07/25/13 0605 07/25/13 0956 07/25/13 1540 07/26/13 0545  TROPONINI 0.36* 0.79* 1.06*  1.00*   BNP (last 3 results)  Recent Labs  07/25/13 0235  PROBNP 949.9*   CBG:  Recent Labs Lab 07/25/13 0609 07/25/13 1152 07/25/13 1602 07/25/13 2129 07/26/13 0651  GLUCAP 417* 295* 317* 216* 170*    Recent Results (from the past 240 hour(s))  MRSA PCR SCREENING     Status: None   Collection Time    07/25/13  6:22 AM      Result Value Range Status   MRSA by PCR NEGATIVE  NEGATIVE Final   Comment:            The GeneXpert MRSA Assay (FDA     approved for NASAL specimens     only), is one component of [facility]     comprehensive MRSA colonization     surveillance program. It is not     intended to diagnose MRSA     infection nor to guide  or     monitor treatment for     MRSA infections.     Studies: Dg Chest Portable 1 View  07/25/2013   CLINICAL DATA:  Respiratory distress  EXAM: PORTABLE CHEST - 1 VIEW  COMPARISON:  09/24/2011  FINDINGS: Previous median sternotomy and CABG procedure. Mild cardiac enlargement is noted. Diffuse bilateral hazy lung opacities are identified right greater than left. The visualized osseous structures are unremarkable.  IMPRESSION: Moderate severe pulmonary edema.   Electronically Signed   By: Signa Kell M.D.   On: 07/25/2013 01:35    Scheduled Meds: . aspirin EC  81 mg Oral Daily  . atorvastatin  20 mg Oral q1800  . carvedilol  37.5 mg Oral BID WC  . cilostazol  100 mg Oral BID  . furosemide  40 mg Intravenous BID  . insulin aspart  0-20 Units Subcutaneous TID WC  . insulin aspart  0-5 Units Subcutaneous QHS  . insulin aspart  4 Units Subcutaneous TID WC  . insulin glargine  55 Units Subcutaneous QHS  . potassium chloride  10 mEq Oral BID  . ramipril  10 mg Oral Daily  . rivaroxaban  20 mg Oral Q supper  . sodium chloride  3 mL Intravenous Q12H   Continuous Infusions:   Time spent: 35 minutes  [name]  [facility] Pager [phone]. If 7PM-7AM, please contact night-coverage at [email or web address], password [facility] 07/26/2013, 8:19 AM  LOS: 1 day

## 2013-07-26 NOTE — Progress Notes (Signed)
Patient recently NSR, notified by Central telemetry of rhythm change to AFib, new orders given, EKG completed, B/P105/52, asymptomatic and asleep, will continue to monitor

## 2013-07-26 NOTE — Progress Notes (Signed)
Pt converted to NSR 90-94.

## 2013-07-26 NOTE — Progress Notes (Signed)
Up in chair.  Client has been told on several occasions to use urinal so we may measure it.  Denies any chest pain or SOB.  Respiratory exchange even and unlabored.

## 2013-07-26 NOTE — Consult Note (Addendum)
Reason for Consult: Acute on chronic CHF, elevated Troponin  Requesting Physician: [facility]  HPI: This is a 63 y.o. male followed by Dr Ellyn Hack with a past medical history significant for CAD. He had CABG x 6 in Sept 2008 with LIMA-LAD, LRA-Dx1, SVG-MO1/OM2, SVG-PD/PL. He had a Myoview in May 2013 that showed LVD with an EF of 31% but no ischemia EF was 40-45%. He had AF in 2013 and has been on Eliquis. He has PVD and has had multiple procedures on his lower extremities. 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 For some reason his wife dropped him from her insurance. The pt says he couldn't afford his medication and has been out for 5 weeks. He reports sudden SOB last night. In the ER he required Bi Pap. He denies any chest pain. His Troponin is positive at 1.06. This am he is sitting in chair, comfortable, no SOB after 4.5 L diuresis.   PMHx:  Past Medical History  Diagnosis Date  . CAD (coronary artery disease), native coronary artery September 2008    Left main 50-60%, LAD 80% with D2 involved. RCA mid occlusion --> CABG  . 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 Ischemic cardiomyopathy September 2008    EF = 35-40%; THE 45-50% IN 2013 --> ECHO 03/03/13 - EF 40-45% LV: Moderate HK of basal-midlateral & inferior myocardium; c/w prior infarction in the distribution of the RCA or LCx. Mild-Mod MR with Calcified annulus. Mod TR  . History of MI (myocardial infarction) 2012    Noted by echocardiography, and nuclear stress testing. Known RCA occlusion prior to CABG  . PAF (paroxysmal atrial fibrillation) - initial presentation with RVR, and heart failure 2013    On a beta blocker, calcium channel blocker & Xarelto. Lexiscan Myoview 11/08/11 LV was enlarged with no evidence of ischemia. There was a moderate sized, moderate to severe in intensity fixed defect in the inferior wall suggestive of scar with no reversible ischemia. There was a basal  septal akinesis w/severe global hypokinesis. EF = 31% (Which is different from the [facility] EF)  . Chronic diastolic CHF (congestive heart failure), NYHA class 2 2009, 2011; 2013    Exacerbated by A. fib RVR, currently on Lasix.  . Peripheral arterial disease 2009, 2011; 2013    2009, 2011; 2013  . Renal artery stenosis, non-flow-limiting   . DM (diabetes mellitus), type 2 with neurological complications     Peripheral neuropathy  . Diabetic peripheral neuropathy associated with type 2 diabetes mellitus   . Hyperlipidemia LDL goal <70     On statin therapy  . Obesity (BMI 30.0-34.9)   . Hypertension associated with diabetes   . Erectile dysfunction associated with type 2 diabetes mellitus  2006  . Ischemic mitral regurgitation  1980s    Mild to moderate  . Thrombocytopenia      chronic  . Osteoarthritis of both knees 1990    Status post left knee a arthroscopy, currently Harding right knee  . History of hepatitis B 1980s  . Adopted     Adopted [Z61.09]   Past Surgical History  Procedure Laterality Date  . Femoral endarterectomy  03-15-2008    Left EIA & CFA endarterectomy by Dr. Amedeo Plenty  . Knee arthroscopy Left 1990    For osteoarthritis pain.  . Tonsillectomy    . Vascular surgery    . Pleural effusion drainage Right   .  Coronary artery bypass graft  03/2007    CABG x 6 LIMA-LAD, left radial to diagonal 1, SVG to OM1-OM2 sequential, SVG to PDA-PLA sequential.    FAMHx: history of cancer   SOCHx:  reports that he quit smoking about 8 years ago. His smoking use included Cigarettes. He started smoking about 40 years ago. He smoked 0.50 packs per day. He has never used smokeless tobacco. He reports that he does not drink alcohol or use illicit drugs.Lives with his wife  ALLERGIES: No Known Allergies  ROS: Pertinent items are noted in HPI. No angina, he denies any recent edema or DOE. For complete ROS see H&P.  HOME MEDICATIONS: Prescriptions prior to admission  Medication  Sig Dispense Refill  . aspirin 81 MG EC tablet Take 81 mg by mouth daily.       Marland Kitchen atorvastatin (LIPITOR) 20 MG tablet TAKE 1 TABLET (20 MG TOTAL) BY MOUTH DAILY.  30 tablet  6  . Canagliflozin (INVOKANA) 100 MG TABS Take 100 mg by mouth daily.      . carvedilol (COREG) 25 MG tablet Take 37.5 mg by mouth 2 (two) times daily with a meal. 1 and 1/2 tablets      . cilostazol (PLETAL) 100 MG tablet TAKE 1 TABLET TWICE A DAY  60 tablet  11  . diltiazem (CARDIZEM CD) 240 MG 24 hr capsule Take 1 capsule (240 mg total) by mouth daily.  30 capsule  11  . furosemide (LASIX) 20 MG tablet Take 1 tablet (20 mg total) by mouth daily.  30 tablet  11  . gabapentin (NEURONTIN) 300 MG capsule Take 300 mg by mouth 3 (three) times daily.      . Glucosamine-Chondroit-Vit C-Mn (GLUCOSAMINE 1500 COMPLEX PO) Take by mouth 2 (two) times daily.      . insulin aspart (NOVOLOG) 100 UNIT/ML injection 30 units total  10 units  Morning  And 20 units evening      . potassium chloride (K-DUR) 10 MEQ tablet Take 1 tablet (10 mEq total) by mouth 2 (two) times daily.  60 tablet  3  . pregabalin (LYRICA) 100 MG capsule Take 100 mg by mouth 3 (three) times daily.      . ramipril (ALTACE) 10 MG capsule TAKE 1 CAPSULE EVERY DAY  30 capsule  3  . Blood Glucose Monitoring Suppl (ONE TOUCH ULTRA MINI) W/DEVICE KIT       . insulin glargine (LANTUS) 100 UNIT/ML injection Inject 55 Units into the skin daily.         HOSPITAL MEDICATIONS: I have reviewed the patient's current medications.  VITALS: Blood pressure 116/66, pulse 93, temperature 97.7 F (36.5 C), temperature source Oral, resp. rate 18, height $RemoveBe'5\' 11"'[id number]$  (1.803 m), weight 225 lb (102.059 kg), SpO2 96.00%.  PHYSICAL EXAM: General appearance: alert, cooperative, no distress and moderately obese Neck: no carotid bruit and no JVD Lungs: clear to auscultation bilaterally Heart: regular rate and rhythm Abdomen: obese Extremities: no edema Pulses: diminnished Skin: cool and  dry Neurologic: Grossly normal  LABS: Results for orders placed during the hospital encounter of 07/25/13 (from the past 48 hour(s))  POCT I-STAT 3, BLOOD GAS (G3+)     Status: Abnormal   Collection Time    07/25/13  1:26 AM      Result Value Range   pH, Arterial 7.228 (*) 7.350 - 7.450   pCO2 arterial 50.5 (*) 35.0 - 45.0 mmHg   pO2, Arterial 51.0 (*) 80.0 - 100.0 mmHg  Bicarbonate 21.1  20.0 - 24.0 mEq/L   TCO2 23  0 - 100 mmol/L   O2 Saturation 78.0     Acid-base deficit 7.0 (*) 0.0 - 2.0 mmol/L   Patient temperature 98.6 F     Collection site RADIAL, ALLEN'S TEST ACCEPTABLE     Drawn by RT     Sample type ARTERIAL    URINALYSIS, ROUTINE W REFLEX MICROSCOPIC     Status: Abnormal   Collection Time    07/25/13  1:34 AM      Result Value Range   Color, Urine YELLOW  YELLOW   APPearance CLEAR  CLEAR   Specific Gravity, Urine 1.030  1.005 - 1.030   pH 7.0  5.0 - 8.0   Glucose, UA >1000 (*) NEGATIVE mg/dL   Hgb urine dipstick NEGATIVE  NEGATIVE   Bilirubin Urine NEGATIVE  NEGATIVE   Ketones, ur 15 (*) NEGATIVE mg/dL   Protein, ur 100 (*) NEGATIVE mg/dL   Urobilinogen, UA 0.2  0.0 - 1.0 mg/dL   Nitrite NEGATIVE  NEGATIVE   Leukocytes, UA NEGATIVE  NEGATIVE  URINE MICROSCOPIC-ADD ON     Status: Abnormal   Collection Time    07/25/13  1:34 AM      Result Value Range   WBC, UA 0-2  <3 WBC/hpf   RBC / HPF 0-2  <3 RBC/hpf   Bacteria, UA RARE  RARE   Casts HYALINE CASTS (*) NEGATIVE   Urine-Other SPERM PRESENT    PRO B NATRIURETIC PEPTIDE     Status: Abnormal   Collection Time    07/25/13  2:35 AM      Result Value Range   Pro B Natriuretic peptide (BNP) 949.9 (*) 0 - 125 pg/mL  CBC WITH DIFFERENTIAL     Status: Abnormal   Collection Time    07/25/13  2:35 AM      Result Value Range   WBC 12.5 (*) 4.0 - 10.5 K/uL   RBC 5.96 (*) 4.22 - 5.81 MIL/uL   Hemoglobin 18.2 (*) 13.0 - 17.0 g/dL   HCT 51.2  39.0 - 52.0 %   MCV 85.9  78.0 - 100.0 fL   MCH 30.5  26.0 - 34.0 pg    MCHC 35.5  30.0 - 36.0 g/dL   RDW 13.4  11.5 - 15.5 %   Platelets 192  150 - 400 K/uL   Neutrophils Relative % 78 (*) 43 - 77 %   Neutro Abs 9.8 (*) 1.7 - 7.7 K/uL   Lymphocytes Relative 15  12 - 46 %   Lymphs Abs 1.9  0.7 - 4.0 K/uL   Monocytes Relative 6  3 - 12 %   Monocytes Absolute 0.7  0.1 - 1.0 K/uL   Eosinophils Relative 1  0 - 5 %   Eosinophils Absolute 0.1  0.0 - 0.7 K/uL   Basophils Relative 0  0 - 1 %   Basophils Absolute 0.1  0.0 - 0.1 K/uL  COMPREHENSIVE METABOLIC PANEL     Status: Abnormal   Collection Time    07/25/13  2:35 AM      Result Value Range   Sodium 138  137 - 147 mEq/L   Potassium 4.7  3.7 - 5.3 mEq/L   Comment: HEMOLYSIS AT THIS LEVEL MAY AFFECT RESULT   Chloride 99  96 - 112 mEq/L   CO2 22  19 - 32 mEq/L   Glucose, Bld 456 (*) 70 - 99 mg/dL   BUN 21  6 - 23 mg/dL   Creatinine, Ser 0.98  0.50 - 1.35 mg/dL   Calcium 8.8  8.4 - 10.5 mg/dL   Total Protein 6.9  6.0 - 8.3 g/dL   Albumin 3.5  3.5 - 5.2 g/dL   AST 33  0 - 37 U/L   ALT 49  0 - 53 U/L   Alkaline Phosphatase 69  39 - 117 U/L   Total Bilirubin 0.7  0.3 - 1.2 mg/dL   GFR calc non Af Amer 88 (*) >90 mL/min   GFR calc Af Amer >90  >90 mL/min   Comment: (NOTE)     The eGFR has been calculated using the CKD EPI equation.     This calculation has not been validated in all clinical situations.     eGFR's persistently <90 mL/min signify possible Chronic Kidney     Disease.  APTT     Status: None   Collection Time    07/25/13  2:35 AM      Result Value Range   aPTT 29  24 - 37 seconds  POCT I-STAT TROPONIN I     Status: None   Collection Time    07/25/13  2:55 AM      Result Value Range   Troponin i, poc 0.00  0.00 - 0.08 ng/mL   Comment 3            Comment: Due to the release kinetics of cTnI,     a negative result within the first hours     of the onset of symptoms does not rule out     myocardial infarction with certainty.     If myocardial infarction is still suspected,     repeat  the test at appropriate intervals.  POCT I-STAT, CHEM 8     Status: Abnormal   Collection Time    07/25/13  2:58 AM      Result Value Range   Sodium 136 (*) 137 - 147 mEq/L   Potassium 4.3  3.7 - 5.3 mEq/L   Chloride 105  96 - 112 mEq/L   BUN 24 (*) 6 - 23 mg/dL   Creatinine, Ser 1.10  0.50 - 1.35 mg/dL   Glucose, Bld 466 (*) 70 - 99 mg/dL   Calcium, Ion 1.09 (*) 1.13 - 1.30 mmol/L   TCO2 24  0 - 100 mmol/L   Hemoglobin 18.7 (*) 13.0 - 17.0 g/dL   HCT 55.0 (*) 39.0 - 52.0 %  CG4 I-STAT (LACTIC ACID)     Status: Abnormal   Collection Time    07/25/13  2:59 AM      Result Value Range   Lactic Acid, Venous 2.61 (*) 0.5 - 2.2 mmol/L  POCT I-STAT 3, BLOOD GAS (G3+)     Status: Abnormal   Collection Time    07/25/13  3:29 AM      Result Value Range   pH, Arterial 7.333 (*) 7.350 - 7.450   pCO2 arterial 47.7 (*) 35.0 - 45.0 mmHg   pO2, Arterial 182.0 (*) 80.0 - 100.0 mmHg   Bicarbonate 25.4 (*) 20.0 - 24.0 mEq/L   TCO2 27  0 - 100 mmol/L   O2 Saturation 100.0     Acid-base deficit 1.0  0.0 - 2.0 mmol/L   Patient temperature 36.63 C     Collection site RADIAL, ALLEN'S TEST ACCEPTABLE     Drawn by RT     Sample type ARTERIAL    TROPONIN I  Status: Abnormal   Collection Time    07/25/13  6:05 AM      Result Value Range   Troponin I 0.36 (*) <0.30 ng/mL   Comment:            Due to the release kinetics of cTnI,     a negative result within the first hours     of the onset of symptoms does not rule out     myocardial infarction with certainty.     If myocardial infarction is still suspected,     repeat the test at appropriate intervals.     CRITICAL RESULT CALLED TO, READ BACK BY AND VERIFIED WITH:     DAYS T,RN 07/25/13 0707 WAYK  GLUCOSE, CAPILLARY     Status: Abnormal   Collection Time    07/25/13  6:08 AM      Result Value Range   Glucose-Capillary 410 (*) 70 - 99 mg/dL  GLUCOSE, CAPILLARY     Status: Abnormal   Collection Time    07/25/13  6:09 AM      Result  Value Range   Glucose-Capillary 417 (*) 70 - 99 mg/dL  MRSA PCR SCREENING     Status: None   Collection Time    07/25/13  6:22 AM      Result Value Range   MRSA by PCR NEGATIVE  NEGATIVE   Comment:            The GeneXpert MRSA Assay (FDA     approved for NASAL specimens     only), is one component of a     comprehensive MRSA colonization     surveillance program. It is not     intended to diagnose MRSA     infection nor to guide or     monitor treatment for     MRSA infections.  TROPONIN I     Status: Abnormal   Collection Time    07/25/13  9:56 AM      Result Value Range   Troponin I 0.79 (*) <0.30 ng/mL   Comment:            Due to the release kinetics of cTnI,     a negative result within the first hours     of the onset of symptoms does not rule out     myocardial infarction with certainty.     If myocardial infarction is still suspected,     repeat the test at appropriate intervals.     CRITICAL VALUE NOTED.  VALUE IS CONSISTENT WITH PREVIOUSLY REPORTED AND CALLED VALUE.  GLUCOSE, CAPILLARY     Status: Abnormal   Collection Time    07/25/13 11:52 AM      Result Value Range   Glucose-Capillary 295 (*) 70 - 99 mg/dL  TROPONIN I     Status: Abnormal   Collection Time    07/25/13  3:40 PM      Result Value Range   Troponin I 1.06 (*) <0.30 ng/mL   Comment:            Due to the release kinetics of cTnI,     a negative result within the first hours     of the onset of symptoms does not rule out     myocardial infarction with certainty.     If myocardial infarction is still suspected,     repeat the test at appropriate intervals.     CRITICAL VALUE NOTED.  VALUE  IS CONSISTENT WITH PREVIOUSLY REPORTED AND CALLED VALUE.  GLUCOSE, CAPILLARY     Status: Abnormal   Collection Time    07/25/13  4:02 PM      Result Value Range   Glucose-Capillary 317 (*) 70 - 99 mg/dL   Comment 1 Documented in Chart     Comment 2 Notify RN    GLUCOSE, CAPILLARY     Status: Abnormal    Collection Time    07/25/13  9:29 PM      Result Value Range   Glucose-Capillary 216 (*) 70 - 99 mg/dL   Comment 1 Notify RN    BASIC METABOLIC PANEL     Status: Abnormal   Collection Time    07/26/13  5:45 AM      Result Value Range   Sodium 137  137 - 147 mEq/L   Potassium 4.1  3.7 - 5.3 mEq/L   Chloride 99  96 - 112 mEq/L   CO2 23  19 - 32 mEq/L   Glucose, Bld 204 (*) 70 - 99 mg/dL   BUN 31 (*) 6 - 23 mg/dL   Creatinine, Ser 1.02  0.50 - 1.35 mg/dL   Calcium 8.9  8.4 - 10.5 mg/dL   GFR calc non Af Amer 78 (*) >90 mL/min   GFR calc Af Amer >90  >90 mL/min   Comment: (NOTE)     The eGFR has been calculated using the CKD EPI equation.     This calculation has not been validated in all clinical situations.     eGFR's persistently <90 mL/min signify possible Chronic Kidney     Disease.  TROPONIN I     Status: Abnormal   Collection Time    07/26/13  5:45 AM      Result Value Range   Troponin I 1.00 (*) <0.30 ng/mL   Comment:            Due to the release kinetics of cTnI,     a negative result within the first hours     of the onset of symptoms does not rule out     myocardial infarction with certainty.     If myocardial infarction is still suspected,     repeat the test at appropriate intervals.     CRITICAL VALUE NOTED.  VALUE IS CONSISTENT WITH PREVIOUSLY REPORTED AND CALLED VALUE.  GLUCOSE, CAPILLARY     Status: Abnormal   Collection Time    07/26/13  6:51 AM      Result Value Range   Glucose-Capillary 170 (*) 70 - 99 mg/dL   Comment 1 Notify RN      EKG: NSR ST LVH with repol.   IMAGING: Dg Chest Portable 1 View  07/25/2013   CLINICAL DATA:  Respiratory distress  EXAM: PORTABLE CHEST - 1 VIEW  COMPARISON:  09/24/2011  FINDINGS: Previous median sternotomy and CABG procedure. Mild cardiac enlargement is noted. Diffuse bilateral hazy lung opacities are identified right greater than left. The visualized osseous structures are unremarkable.  IMPRESSION: Moderate severe  pulmonary edema.   Electronically Signed   By: Kerby Moors M.D.   On: 07/25/2013 01:35    IMPRESSION: Principal Problem:   Acute respiratory failure Active Problems:   CAD- CABG x 6 9/08- Myoview 5/13 with scar, no ischemia   Acute on chronic systolic heart failure   Noncompliance   NSTEMI - Troponin 1.06   DM type 2, uncontrolled, with neuropathy   Anticoagulation adequate, on Xarelto  Ischemic cardiomyopathy   Atherosclerosis of native arteries of the extremities with intermittent claudication   PAD - R Fem-Pop, L SFA stent   Hypertension   Hyperlipidemia   PAF- initial presentation with RVR, and heart failure   Elevated troponin   RECOMMENDATION: MD to see. Acute onset of pulmonary edema and elevated Troponin concerning, he may need cath. Home meds resumed. Add Heparin. May want to resume Coreg at a lower dose.   Time Spent Directly with Patient: 45 minutes  Erlene Quan [phone] beeper 07/26/2013, 8:53 AM    Patient personally seen and examined, agree with above.  1. Non-ST elevation myocardial infarction-likely type II in the setting of respiratory failure/acute systolic heart failure. Nonetheless, given his extensive coronary disease history, prior bypass surgery, troponin of 1.06 I do believe that he may benefit from cardiac catheterization to further evaluate his anatomy. We'll make him n.p.o. past midnight.  2. Ischemic cardiomyopathy-previous ejection fraction 31% on nuclear stress test, 40% on echocardiogram. He has been out of medications due to lack of insurance. Resuming medications.  3. Acute on chronic systolic heart failure-this may of been completely secondary to lack of medications at home. Currently much improved with home regimen. Carvedilol quite elevated at 37.5 mg twice a day. Monitor for any signs of bradycardia. Currently fine. He is also on Cardizem which can be a negative inotrope in the setting of cardiomyopathy. I'm unsure if he is wiring this  because of previous tachycardia. Based upon his medication and very high dose of carvedilol, it is likely that he remained tachycardic despite highly elevated beta blocker. Perhaps his calcium channel blocker addition was required.  4. Paroxysmal atrial fibrillation-currently in sinus rhythm. Previous anticoagulation with Xarelto. This is currently on hold (last dose 07/25/13, evening) and we will start IV heparin this afternoon. This is in anticipation of cardiac catheterization  5. Severe peripheral vascular disease - R Fem-Pop, L SFA stent. Access for cardiac catheterization may need to be carefully reviewed. Perhaps left groin (prior L SFA stent vs. Left radial (not a bounding pulse).  I will write precatheterization orders. If he continues to improve significantly with medications restarted, no longer has any significant dyspnea, one may consider postponing catheterization or perhaps continuing to treat him medically, endorsing medical compliance.   We will follow along  Candee Furbish, MD

## 2013-07-26 NOTE — Progress Notes (Signed)
ANTICOAGULATION CONSULT NOTE - FOLLOW UP  Pharmacy Consult for Heparin Indication: nonvalvular afib  No Known Allergies  Patient Measurements: Height: 5\' 11"  (180.3 cm) Weight: 225 lb (102.059 kg) IBW/kg (Calculated) : 75.3 Heparin dosing weight: 95 kg  Vital Signs: Temp: 97.7 F (36.5 C) (01/25 0508) Temp src: Oral (01/25 0508) BP: 116/66 mmHg (01/25 0508) Pulse Rate: 93 (01/25 0508)  Labs:  Recent Labs  07/25/13 0235 07/25/13 0258  07/25/13 0956 07/25/13 1540 07/26/13 0545  HGB 18.2* 18.7*  --   --   --   --   HCT 51.2 55.0*  --   --   --   --   PLT 192  --   --   --   --   --   APTT 29  --   --   --   --   --   CREATININE 0.98 1.10  --   --   --  1.02  TROPONINI  --   --   < > 0.79* 1.06* 1.00*  < > = values in this interval not displayed.  Estimated Creatinine Clearance: 93.7 ml/min (by C-G formula based on Cr of 1.02).   Medical History: Past Medical History  Diagnosis Date  . CAD (coronary artery disease), native coronary artery September 2008    Left main 50-60%, LAD 80% with D2 involved. RCA mid occlusion --> CABG  . 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 Ischemic cardiomyopathy September 2008    EF = 35-40%; THE 45-50% IN 2013 --> ECHO 03/03/13 - EF 40-45% LV: Moderate HK of basal-midlateral & inferior myocardium; c/w prior infarction in the distribution of the RCA or LCx. Mild-Mod MR with Calcified annulus. Mod TR  . History of MI (myocardial infarction) 2012    Noted by echocardiography, and nuclear stress testing. Known RCA occlusion prior to CABG  . PAF (paroxysmal atrial fibrillation) - initial presentation with RVR, and heart failure 2013    On a beta blocker, calcium channel blocker & Xarelto. Lexiscan Myoview 11/08/11 LV was enlarged with no evidence of ischemia. There was a moderate sized, moderate to severe in intensity fixed defect in the inferior wall suggestive of scar with no reversible ischemia. There  was a basal septal akinesis w/severe global hypokinesis. EF = 31% ([facility]  . Chronic diastolic CHF (congestive heart failure), NYHA class 2 2009, 2011; 2013    Exacerbated by A. fib RVR, currently on Lasix.  . Peripheral arterial disease 2009, 2011; 2013    2009, 2011; 2013  . Renal artery stenosis, non-flow-limiting   . DM (diabetes mellitus), type 2 with neurological complications     Peripheral neuropathy  . Diabetic peripheral neuropathy associated with type 2 diabetes mellitus   . Hyperlipidemia LDL goal <70     On statin therapy  . Obesity (BMI 30.0-34.9)   . Hypertension associated with diabetes   . Erectile dysfunction associated with type 2 diabetes mellitus  2006  . Ischemic mitral regurgitation  1980s    Mild to moderate  . Thrombocytopenia      chronic  . Osteoarthritis of both knees 1990    Status post left knee a arthroscopy, currently Harding right knee  . History of hepatitis B 1980s  . Adopted     Adopted [V68.89]    Medications:  Scheduled:  . aspirin EC  81 mg Oral Daily  . atorvastatin  20 mg  Oral q1800  . carvedilol  37.5 mg Oral BID WC  . furosemide  40 mg Oral BID  . insulin aspart  0-20 Units Subcutaneous TID WC  . insulin aspart  0-5 Units Subcutaneous QHS  . insulin aspart  4 Units Subcutaneous TID WC  . insulin glargine  55 Units Subcutaneous QHS  . potassium chloride  10 mEq Oral BID  . ramipril  10 mg Oral Daily  . sodium chloride  3 mL Intravenous Q12H   Assessment: 63 yo AAM admitted 07/25/2013  with respiratory distress.  Patient has extensive cardiac history including nonvalvular paroxysmal afib, CAD, CABG x6, hx MI, chronic diastolic HF, and PAD.  Upon admission there was some confusion as to which anticoagulant the patient was taking PTA and how much (Pradaxa vs. Xarelto).  When further investigated, it appears the patient has been non-compliant on meds for some time, but does have a bag of medications that  include a bottle of Xarelto 20mg  dosed daily.  Pharmacy consulted to dose Xarelto then transition to heparin (1/25)  Events:  Troponin rising, possible cath.  Transition to heparin  Coag:  Afib, ACS: rivaroxaban last dose 1/24 at 1840, no bleeding noted  CV: ACS:   Asa, atorva, coreg, furo 40 bid, K 10 bid (K 4.1), ramipril   Goal of Therapy:  Heparin level 0.7-0.3, or aPTT 1.5-2x ULN, depending upon rate of rivaroxaban clearance Monitor platelets by anticoagulation protocol: Yes   Plan:  1. Follow up baseline HL on rivaroxaban 2. Last dose of rivaroxaban was last night at 1840, will begin heparin 1300 units/hr today at 1800, follow up 6h HL, daily CBC  Thank you for allowing pharmacy to be a part of this patients care team.  Lovenia Kim Pharm.D., BCPS Clinical Pharmacist 07/26/2013 8:44 AM Pager: [phone] Phone: [phone]

## 2013-07-26 NOTE — Progress Notes (Signed)
Echo Lab  2D Echocardiogram completed.  Chae Oommen L Malichi Palardy, RDCS 07/26/2013 10:24 AM

## 2013-07-27 ENCOUNTER — Encounter (HOSPITAL_COMMUNITY)
Admission: EM | Disposition: A | Discharge status: Home / Self Care | Payer: Self-pay | Source: Home / Self Care | Attending: Internal Medicine

## 2013-07-27 DIAGNOSIS — Z7901 Long term (current) use of anticoagulants: Secondary | ICD-10-CM

## 2013-07-27 DIAGNOSIS — I2510 Atherosclerotic heart disease of native coronary artery without angina pectoris: Secondary | ICD-10-CM

## 2013-07-27 DIAGNOSIS — I059 Rheumatic mitral valve disease, unspecified: Secondary | ICD-10-CM

## 2013-07-27 LAB — APTT
APTT: 99 s — AB (ref 24–37)
aPTT: 66 seconds — ABNORMAL HIGH (ref 24–37)

## 2013-07-27 LAB — BASIC METABOLIC PANEL
BUN: 32 mg/dL — ABNORMAL HIGH (ref 6–23)
CALCIUM: 8.6 mg/dL (ref 8.4–10.5)
CHLORIDE: 100 meq/L (ref 96–112)
CO2: 26 mEq/L (ref 19–32)
Creatinine, Ser: 0.93 mg/dL (ref 0.50–1.35)
GFR calc non Af Amer: 89 mL/min — ABNORMAL LOW (ref >90)
Glucose, Bld: 116 mg/dL — ABNORMAL HIGH (ref 70–99)
Potassium: 3.4 mEq/L — ABNORMAL LOW (ref 3.7–5.3)
SODIUM: 139 meq/L (ref 137–147)

## 2013-07-27 LAB — CBC
HCT: 42.6 % (ref 39.0–52.0)
Hemoglobin: 15.1 g/dL (ref 13.0–17.0)
MCH: 30.3 pg (ref 26.0–34.0)
MCHC: 35.4 g/dL (ref 30.0–36.0)
MCV: 85.5 fL (ref 78.0–100.0)
PLATELETS: 183 10*3/uL (ref 150–400)
RBC: 4.98 MIL/uL (ref 4.22–5.81)
RDW: 13.7 % (ref 11.5–15.5)
WBC: 8.7 10*3/uL (ref 4.0–10.5)

## 2013-07-27 LAB — PROTIME-INR
INR: 1.12 (ref 0.00–1.49)
Prothrombin Time: 14.2 seconds (ref 11.6–15.2)

## 2013-07-27 LAB — GLUCOSE, CAPILLARY
GLUCOSE-CAPILLARY: 123 mg/dL — AB (ref 70–99)
GLUCOSE-CAPILLARY: 116 mg/dL — AB (ref 70–99)
Glucose-Capillary: 118 mg/dL — ABNORMAL HIGH (ref 70–99)
Glucose-Capillary: 261 mg/dL — ABNORMAL HIGH (ref 70–99)

## 2013-07-27 SURGERY — LEFT HEART CATHETERIZATION WITH CORONARY ANGIOGRAM
Anesthesia: LOCAL

## 2013-07-27 MED ORDER — RIVAROXABAN 20 MG PO TABS
20.0000 mg | ORAL_TABLET | Freq: Every day | ORAL | Status: DC
  Administered 2013-07-27: 20 mg via ORAL
  Filled 2013-07-27 (×2): qty 1

## 2013-07-27 MED ORDER — FUROSEMIDE 40 MG PO TABS
40.0000 mg | ORAL_TABLET | Freq: Every day | ORAL | Status: DC
  Administered 2013-07-28: 40 mg via ORAL
  Filled 2013-07-27: qty 1

## 2013-07-27 MED ORDER — POTASSIUM CHLORIDE CRYS ER 20 MEQ PO TBCR
40.0000 meq | EXTENDED_RELEASE_TABLET | Freq: Once | ORAL | Status: AC
  Administered 2013-07-27: 40 meq via ORAL
  Filled 2013-07-27: qty 2

## 2013-07-27 MED ORDER — SODIUM CHLORIDE 0.9 % IV SOLN
INTRAVENOUS | Status: DC
Start: 2013-07-27 — End: 2013-07-28
  Administered 2013-07-27: 10 mL/h via INTRAVENOUS

## 2013-07-27 NOTE — Progress Notes (Signed)
Inpatient Diabetes Program Recommendations  AACE/ADA: New Consensus Statement on Inpatient Glycemic Control (2013)  Target Ranges:  Prepandial:   less than 140 mg/dL      Peak postprandial:   less than 180 mg/dL (1-2 hours)      Critically ill patients:  140 - 180 mg/dL  Results for William Pacheco, William Pacheco (MRN [id number]) as of 07/27/2013 13:06  Ref. Range 07/26/2013 12:00 07/26/2013 16:09 07/26/2013 21:27 07/27/2013 05:59 07/27/2013 11:44  Glucose-Capillary Latest Range: 70-99 mg/dL 497 (H) 026 (H) 378 (H) 118 (H) 123 (H)   Inpatient Diabetes Program Recommendations HgbA1C: order to assess prehospital glucose control  Thank you  Piedad Climes BSN, RN,CDE Inpatient Diabetes Coordinator [phone] (team pager)

## 2013-07-27 NOTE — Progress Notes (Signed)
I have reviewed the patients history, records and examined the patient. He has no angina. Troponin has peaked at 1.06. Pattern and presentation is compatible with type II NSTEMI related to medication non-compliance and pressure/volume overload. He has not had angina. He has diuresed > 4 liters and feels back to baseline.  After discussion with the patient, we will cancel cath, get him back on a stable medical regimen, and consider discharge with outpatient f/u for ischemic evaluation in next 24 hours. Will need to ambulate as we adjust meds.

## 2013-07-27 NOTE — Progress Notes (Addendum)
ANTICOAGULATION CONSULT NOTE - Follow Up Consult  Pharmacy Consult for Heparin -> Rivaroxaban Indication: atrial fibrillation  No Known Allergies  Patient Measurements: Height: 5\' 11"  (180.3 cm) Weight: 225 lb 1.6 oz (102.105 kg) (a scale) IBW/kg (Calculated) : 75.3 Heparin Dosing Weight: ~95kg  Vital Signs: Temp: 97.1 F (36.2 C) (01/26 0547) Temp src: Oral (01/26 0547) BP: 99/59 mmHg (01/26 0854) Pulse Rate: 76 (01/26 0854)  Labs:  Recent Labs  07/25/13 0235 07/25/13 0258  07/25/13 0956 07/25/13 1540 07/26/13 0545 07/26/13 0845 07/26/13 2350 07/27/13 0348 07/27/13 0830  HGB 18.2* 18.7*  --   --   --   --   --   --  15.1  --   HCT 51.2 55.0*  --   --   --   --   --   --  42.6  --   PLT 192  --   --   --   --   --   --   --  183  --   APTT 29  --   --   --   --   --   --  66*  --  99*  LABPROT  --   --   --   --   --   --   --   --   --  14.2  INR  --   --   --   --   --   --   --   --   --  1.12  HEPARINUNFRC  --   --   --   --   --   --  >2.20*  --   --   --   CREATININE 0.98 1.10  --   --   --  1.02  --   --  0.93  --   TROPONINI  --   --   < > 0.79* 1.06* 1.00*  --   --   --   --   < > = values in this interval not displayed.  Estimated Creatinine Clearance: 102.7 ml/min (by C-G formula based on Cr of 0.93).   Medications:  Heparin 1300 units/hr- > 1400 units/hr  Assessment: 63 yo AAM admitted 07/25/2013 with respiratory distress. Patient has extensive cardiac history including nonvalvular paroxysmal afib, CAD, CABG x6, hx MI, chronic diastolic HF, and PAD. Upon admission there was some confusion as to which anticoagulant the patient was taking PTA and how much (Pradaxa vs. Xarelto). When further investigated, it appears the patient has been non-compliant on meds for some time, but does have a bag of medications that include a bottle of Xarelto 20mg  dosed daily. Pharmacy consulted to dose Xarelto then transition to heparin (1/25) > back to xarelto 1/26  Events:  Cath canceled, plan to resume rivaroxaban, case management to see re supplyof medications.    Coag: Afib, rivaroxaban 20 mg daily  CV: ACS:  Asa, atorva, coreg, furo 40 bid, K 10 bid (K 3.4), ramipril  Goal of Therapy:  Heparin level 0.3-0.7 units/ml Monitor platelets by anticoagulation protocol: Yes   Plan:  -Continue heparin till and then DC at the same time as give first dose rivaroxaban 20 mg daily -Daily CBC  Thank you for allowing pharmacy to be a part of this patients care team.  Lovenia Kim Pharm.D., BCPS Clinical Pharmacist 07/27/2013 12:04 PM Pager: [phone] Phone: [phone]

## 2013-07-27 NOTE — Progress Notes (Signed)
UR completed William Barner K. Macen Joslin, RN, BSN, MSHL, CCM  07/27/2013 12:00 PM

## 2013-07-27 NOTE — Progress Notes (Signed)
[facility] PROGRESS NOTE  William Pacheco JME:[id number] DOB: April 29, 1951 DOA: 07/25/2013 PCP: Dorrene German, MD  Assessment/Plan:    Acute respiratory failure secondary to CHF, much improved. Wean oxygen and increase activity   Acute on chronic systolic heart failure secondary to noncompliance and hypertensive urgency. EF lower at 15 - 20%. Also Right HF  Mod to severe MR:    Ischemic cardiomyopathy:     CAD (coronary artery disease) - CABG x 6: Discussed with Dr. Katrinka Blazing. Cardiac catheterization canceled. Stop heparin drip. Resume is xarelto. Also stop aspirin and Pletal.    DM type 2, uncontrolled, with neuropathy: improved    PAF (paroxysmal atrial fibrillation)    Elevated troponin: see above    Atherosclerosis of native arteries of the extremities with intermittent claudication:     PAD (peripheral artery disease) - R Fem-Pop, L SFA stent    Hyperlipidemia back on statin    Noncompliance: Would simplify medication regimen as much as possible, as patient is concerned about even being able to afford co-pays.  Likely home tomorrow if stable.  Code Status:  full Family Communication:  Wife 1/24 Disposition Plan:  home  Consultants:  cardiology  Procedures:     Antibiotics:    HPI/Subjective: Thirsty. Breathing at baseline. No chest pain.  Objective: Filed Vitals:   07/27/13 0854  BP: 99/59  Pulse: 76  Temp:   Resp: 20    Intake/Output Summary (Last 24 hours) at 07/27/13 1338 Last data filed at 07/27/13 0857  Gross per 24 hour  Intake 1000.88 ml  Output   1425 ml  Net -424.12 ml   Filed Weights   07/25/13 1239 07/26/13 0508 07/27/13 0547  Weight: 100.381 kg (221 lb 4.8 oz) 102.059 kg (225 lb) 102.105 kg (225 lb 1.6 oz)   Tele: a fib last night.  Exam:   General:  Comfortable.  Cardiovascular: RRR without MGR  Respiratory: Clear to auscultation bilaterally without wheezes rhonchi or rales  Abdomen: S, NT, ND  Ext: trace  edema. No CC  Basic Metabolic Panel:  Recent Labs Lab 07/25/13 0235 07/25/13 0258 07/26/13 0545 07/27/13 0348  NA 138 136* 137 139  K 4.7 4.3 4.1 3.4*  CL 99 105 99 100  CO2 22  --  23 26  GLUCOSE 456* 466* 204* 116*  BUN 21 24* 31* 32*  CREATININE 0.98 1.10 1.02 0.93  CALCIUM 8.8  --  8.9 8.6   Liver Function Tests:  Recent Labs Lab 07/25/13 0235  AST 33  ALT 49  ALKPHOS 69  BILITOT 0.7  PROT 6.9  ALBUMIN 3.5   No results found for this basename: LIPASE, AMYLASE,  in the last 168 hours No results found for this basename: AMMONIA,  in the last 168 hours CBC:  Recent Labs Lab 07/25/13 0235 07/25/13 0258 07/27/13 0348  WBC 12.5*  --  8.7  NEUTROABS 9.8*  --   --   HGB 18.2* 18.7* 15.1  HCT 51.2 55.0* 42.6  MCV 85.9  --  85.5  PLT 192  --  183   Cardiac Enzymes:  Recent Labs Lab 07/25/13 0605 07/25/13 0956 07/25/13 1540 07/26/13 0545  TROPONINI 0.36* 0.79* 1.06* 1.00*   BNP (last 3 results)  Recent Labs  07/25/13 0235  PROBNP 949.9*   CBG:  Recent Labs Lab 07/26/13 1200 07/26/13 1609 07/26/13 2127 07/27/13 0559 07/27/13 1144  GLUCAP 208* 250* 208* 118* 123*    Recent Results (from the past 240 hour(s))  CULTURE, BLOOD (  ROUTINE X 2)     Status: None   Collection Time    07/25/13  2:20 AM      Result Value Range Status   Specimen Description BLOOD RIGHT HAND   Final   Special Requests BOTTLES DRAWN AEROBIC ONLY 10CCS   Final   Culture  Setup Time     Final   Value: 07/25/2013 14:43     Performed at [facility]   Culture     Final   Value:        BLOOD CULTURE RECEIVED NO GROWTH TO DATE CULTURE WILL BE HELD FOR 5 DAYS BEFORE ISSUING A FINAL NEGATIVE REPORT     Performed at [facility]   Report Status PENDING   Incomplete  CULTURE, BLOOD (ROUTINE X 2)     Status: None   Collection Time    07/25/13  2:30 AM      Result Value Range Status   Specimen Description BLOOD LEFT HAND   Final   Special Requests BOTTLES  DRAWN AEROBIC ONLY 3CCS   Final   Culture  Setup Time     Final   Value: 07/25/2013 14:43     Performed at [facility]   Culture     Final   Value:        BLOOD CULTURE RECEIVED NO GROWTH TO DATE CULTURE WILL BE HELD FOR 5 DAYS BEFORE ISSUING A FINAL NEGATIVE REPORT     Performed at [facility]   Report Status PENDING   Incomplete  MRSA PCR SCREENING     Status: None   Collection Time    07/25/13  6:22 AM      Result Value Range Status   MRSA by PCR NEGATIVE  NEGATIVE Final   Comment:            The GeneXpert MRSA Assay (FDA     approved for NASAL specimens     only), is one component of a     comprehensive MRSA colonization     surveillance program. It is not     intended to diagnose MRSA     infection nor to guide or     monitor treatment for     MRSA infections.     Studies: No results found. ECHO Left ventricle: The cavity size was normal. Wall thickness was increased in a pattern of mild LVH. Systolic function was severely reduced. The estimated ejection fraction was in the range of 15% to 20%. There is akinesis of the basalinferolateral and inferoseptal myocardium. There is akinesis of the basal-midinferior myocardium. Doppler parameters are consistent with restrictive physiology, indicative of decreased left ventricular diastolic compliance and/or increased left atrial pressure. Doppler parameters are consistent with elevated ventricular end-diastolic filling pressure. - Aortic valve: Mildly calcified annulus. Trileaflet. No significant regurgitation. - Mitral valve: Calcification, with involvement of posterior leaflet chords. There is restricted posterior leaflet motion. Moderate regurgitation directed eccentrically. PISA calculations suggest moderate to severe mitral regurgitation with ERO 32 mm2 and RV 52 ml. - Left atrium: The atrium was moderately dilated. - Right ventricle: Systolic function was mildly to moderately reduced. - Tricuspid  valve: Mild regurgitation. - Pulmonary arteries: Systolic pressure could not be accurately estimated. Poor TR Doppler envelope. - Inferior vena cava: Not visualized. Unable to estimate CVP. - Pericardium, extracardiac: There was no pericardial effusion. Impressions:  - Mild LVH with decline in LVEF compared to prior study, now in range of 15-20%, diffuse hypokinesis with inferolateral  wall motion abnormalities consistent with ischemic heart disease (mixed cardiomyopathy not excluded). Moderate left atrial enlargement. Thickened mitral valve with posterior chordal calcification and restricted motion. Probable ischemic mitral regurgitation of at least moderate degree (see above). Mildly to moderately reduced RV contraction. Mild tricuspid regurgitation - unable to assess PASP.    Scheduled Meds: . aspirin EC  81 mg Oral Daily  . atorvastatin  20 mg Oral q1800  . carvedilol  37.5 mg Oral BID WC  . diazepam  5 mg Oral On Call  . furosemide  40 mg Oral BID  . [START ON 07/28/2013] furosemide  40 mg Oral Daily  . insulin aspart  0-20 Units Subcutaneous TID WC  . insulin aspart  0-5 Units Subcutaneous QHS  . insulin aspart  4 Units Subcutaneous TID WC  . insulin glargine  28 Units Subcutaneous QHS  . potassium chloride  10 mEq Oral BID  . ramipril  10 mg Oral Daily  . rivaroxaban  20 mg Oral Q supper  . sodium chloride  3 mL Intravenous Q12H  . sodium chloride  3 mL Intravenous Q12H   Continuous Infusions: . sodium chloride 10 mL/hr (07/27/13 0849)    Time spent: 35 minutes  Takyla Kuchera L  [facility] Pager [phone]. If 7PM-7AM, please contact night-coverage at [email or web address], password [facility] 07/27/2013, 1:38 PM  LOS: 2 days

## 2013-07-27 NOTE — Progress Notes (Signed)
Patient Name: William Pacheco Date of Encounter: 07/27/2013  Principal Problem:   Acute respiratory failure Active Problems:   Atherosclerosis of native arteries of the extremities with intermittent claudication   PAD - R Fem-Pop, L SFA stent   Hypertension   CAD- CABG x 6 9/08- Myoview 5/13 with scar, no ischemia   Hyperlipidemia   DM type 2, uncontrolled, with neuropathy   Anticoagulation adequate, on Xarelto   PAF- initial presentation with RVR, and heart failure   Ischemic cardiomyopathy   Acute on chronic systolic heart failure   Noncompliance   NSTEMI - Troponin 1.06   Elevated troponin    SUBJECTIVE: Feels breathing is at baseline. No chest pain. Wants to know what is going on. Admits he should have put more effort into getting his meds. Plans compliance in the future.  OBJECTIVE Filed Vitals:   07/26/13 1400 07/26/13 2121 07/27/13 0156 07/27/13 0547  BP: 90/48 107/67 122/67 97/64  Pulse: 78 81 83 77  Temp: 97.9 F (36.6 C) 97.3 F (36.3 C) 97.3 F (36.3 C) 97.1 F (36.2 C)  TempSrc: Oral Oral Oral Oral  Resp: 20 18 18 18   Height:      Weight:    225 lb 1.6 oz (102.105 kg)  SpO2: 98% 98% 99% 98%    Intake/Output Summary (Last 24 hours) at 07/27/13 0817 Last data filed at 07/27/13 0806  Gross per 24 hour  Intake 1000.88 ml  Output   1425 ml  Net -424.12 ml   Filed Weights   07/25/13 1239 07/26/13 0508 07/27/13 0547  Weight: 221 lb 4.8 oz (100.381 kg) 225 lb (102.059 kg) 225 lb 1.6 oz (102.105 kg)    PHYSICAL EXAM General: Well developed, well nourished, male in no acute distress. Head: Normocephalic, atraumatic.  Neck: Supple without bruits, JVD at 8 cm. Lungs:  Resp regular and unlabored, few rales bases. Heart: RRR, S1, S2, no S3, S4, or murmur; no rub. Abdomen: Soft, non-tender, non-distended, BS + x 4.  Extremities: No clubbing, cyanosis, no edema.  Neuro: Alert and oriented X 3. Moves all extremities spontaneously. Psych: Normal  affect.  LABS: CBC: Recent Labs  07/25/13 0235 07/25/13 0258 07/27/13 0348  WBC 12.5*  --  8.7  NEUTROABS 9.8*  --   --   HGB 18.2* 18.7* 15.1  HCT 51.2 55.0* 42.6  MCV 85.9  --  85.5  PLT 192  --  183   Basic Metabolic Panel: Recent Labs  07/26/13 0545 07/27/13 0348  NA 137 139  K 4.1 3.4*  CL 99 100  CO2 23 26  GLUCOSE 204* 116*  BUN 31* 32*  CREATININE 1.02 0.93  CALCIUM 8.9 8.6   Liver Function Tests: Recent Labs  07/25/13 0235  AST 33  ALT 49  ALKPHOS 69  BILITOT 0.7  PROT 6.9  ALBUMIN 3.5   Cardiac Enzymes: Recent Labs  07/25/13 0956 07/25/13 1540 07/26/13 0545  TROPONINI 0.79* 1.06* 1.00*    Recent Labs  07/25/13 0255  TROPIPOC 0.00   BNP: Pro B Natriuretic peptide (BNP)  Date/Time Value Range Status  07/25/2013  2:35 AM 949.9* 0 - 125 pg/mL Final  09/24/2011  3:59 AM 384.5* 0 - 125 pg/mL Final   TELE:   SR, PACs and PVCs  Radiology/Studies: Dg Chest Portable 1 View 07/25/2013   CLINICAL DATA:  Respiratory distress  EXAM: PORTABLE CHEST - 1 VIEW  COMPARISON:  09/24/2011  FINDINGS: Previous median sternotomy and CABG procedure. Mild  cardiac enlargement is noted. Diffuse bilateral hazy lung opacities are identified right greater than left. The visualized osseous structures are unremarkable.  IMPRESSION: Moderate severe pulmonary edema.   Electronically Signed   By: Signa Kell M.D.   On: 07/25/2013 01:35    Current Medications:  . aspirin EC  81 mg Oral Daily  . atorvastatin  20 mg Oral q1800  . carvedilol  37.5 mg Oral BID WC  . diazepam  5 mg Oral On Call  . furosemide  40 mg Oral BID  . insulin aspart  0-20 Units Subcutaneous TID WC  . insulin aspart  0-5 Units Subcutaneous QHS  . insulin aspart  4 Units Subcutaneous TID WC  . insulin glargine  28 Units Subcutaneous QHS  . potassium chloride  10 mEq Oral BID  . potassium chloride  40 mEq Oral Once  . ramipril  10 mg Oral Daily  . sodium chloride  3 mL Intravenous Q12H  . sodium  chloride  3 mL Intravenous Q12H   . sodium chloride    . heparin 1,400 Units/hr (07/27/13 0133)    ASSESSMENT AND PLAN: 1. Non-ST elevation myocardial infarction - likely type II in the setting of respiratory failure/acute systolic heart failure. Nonetheless, given his extensive coronary disease history, prior bypass surgery, troponin of 1.06 Believe that he may benefit from cardiac catheterization to further evaluate his anatomy. Planned for 01/26 pm.   2. Ischemic cardiomyopathy-previous ejection fraction 31% on nuclear stress test, 40% on echocardiogram. He has been out of medications due to lack of insurance. Resuming medications.   3. Acute on chronic systolic heart failure-this may of been completely secondary to lack of medications at home. Currently much improved with home regimen.   4. Paroxysmal atrial fibrillation-currently in sinus rhythm on Coreg. Carvedilol quite elevated at 37.5 mg twice a day. Monitor for any signs of bradycardia. Currently fine. He was also on Cardizem at home which can be a negative inotrope in the setting of cardiomyopathy. It was held. Based upon his medication and very high dose of carvedilol, it is likely that he remained tachycardic despite high beta blocker dose. Perhaps his calcium channel blocker addition was required.     5. Severe peripheral vascular disease - R Fem-Pop, L SFA stent. Access for cardiac catheterization may need to be carefully reviewed. Perhaps left groin (prior L SFA stent vs. Left radial (not a bounding pulse).   6. Hypokalemia - from diuresis, supp ordered. Follow, per primary MD  7. Chronic anticoagulation - Previous anticoagulation with Xarelto. This is currently on hold (last dose 07/25/13, evening) and IV heparin started 01/25. This is in anticipation of cardiac catheterization. Pt prefers Xarelto for anticoag.    From 01/25 note: I will write precatheterization orders. If he continues to improve significantly with medications  restarted, no longer has any significant dyspnea, one may consider postponing catheterization or perhaps continuing to treat him medically, endorsing medical compliance.  01/26: Discussed situation with patient. He prefers to know what is going on. Renal function is OK, will supp K+. Cath this pm.   Principal Problem:   Acute respiratory failure Active Problems:   Atherosclerosis of native arteries of the extremities with intermittent claudication   PAD - R Fem-Pop, L SFA stent   Hypertension   CAD- CABG x 6 9/08- Myoview 5/13 with scar, no ischemia   Hyperlipidemia   DM type 2, uncontrolled, with neuropathy   Anticoagulation adequate, on Xarelto   PAF- initial presentation with RVR,  and heart failure   Ischemic cardiomyopathy   Acute on chronic systolic heart failure   Noncompliance   NSTEMI - Troponin 1.06   Elevated troponin   Melida QuitterSigned, Rhonda Barrett , PA-C 8:17 AM 07/27/2013

## 2013-07-27 NOTE — Care Management Note (Addendum)
  Page 2 of 2   07/28/2013     2:19:29 PM   CARE MANAGEMENT NOTE 07/28/2013  Patient:  William Pacheco, William Pacheco   Account Number:  [id number]  Date Initiated:  07/27/2013  Documentation initiated by:  Everlee Quakenbush  Subjective/Objective Assessment:   Admitted with ARD, CP     Action/Plan:   CM will follow for disposition needs.   Anticipated DC Date:  07/30/2013   Anticipated DC Plan:  HOME/SELF CARE      DC Planning Services  CM consult      Choice offered to / List presented to:             Status of service:  Completed, signed off Medicare Important Message given?   (If response is "NO", the following Medicare IM given date fields will be blank) Date Medicare IM given:   Date Additional Medicare IM given:    Discharge Disposition:    Per UR Regulation:  Reviewed for med. necessity/level of care/duration of stay  If discussed at Long Length of Stay Meetings, dates discussed:    Comments:  07/28/2013 Xarelto Benefits Check:  $55.00 co-pay and must go to [facility]. Xarelto Drug Card provided to patient and co-pay information update. [address] RN, BSN, [place], [place]  [phone]  07/27/2013 Social:  From home with wife.  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   Active with coverage 07/02/2013 but never resumed his prescribed medications.  Patient confirms medication coverage with insurance provider. PCP:  Dr. Gaynell Face - last OV about 3-4 months ago.  Off meds x 2 months. Transportation:  Self - still driving Home DME:  BPap since 2008 Admitted with IV Lasix:  heart cath procedure today, Hep gtt, po lasix, BUN 32 Disposition Pending: ADD:  [address] RN, BSN, MSHL, CCM ([place]) [phone] 07/27/2013

## 2013-07-27 NOTE — Progress Notes (Signed)
I spoke at length with Mr. Nodal regarding his Heart Failure diagnosis.  He has a good understanding of the diagnosis and signs and symptoms.  He does admit that he does not weigh regularly and I spoke about the importance of that and when to call the physician.  He also says that he quit taking most of his medications secondary to no insurance and financial constraints.  He does say that he has a new medication card now and I will be working with care management to have a plan for his medications at discharge.  He says that stopping his medications is what he believes caused him to be admitted to hospital.  He does typically eat a low sodium diet and he does most of the cooking.  I will see him again prior to discharge and review heart failure education again.  Driscilla Moats RN, BSN, PCCN--Heart Failure Navigator

## 2013-07-27 NOTE — Progress Notes (Signed)
ANTICOAGULATION CONSULT NOTE - Follow Up Consult  Pharmacy Consult for Heparin  Indication: atrial fibrillation  No Known Allergies  Patient Measurements: Height: 5\' 11"  (180.3 cm) Weight: 225 lb (102.059 kg) IBW/kg (Calculated) : 75.3 Heparin Dosing Weight: ~95kg  Vital Signs: Temp: 97.3 F (36.3 C) (01/25 2121) Temp src: Oral (01/25 2121) BP: 107/67 mmHg (01/25 2121) Pulse Rate: 81 (01/25 2121)  Labs:  Recent Labs  07/25/13 0235 07/25/13 0258  07/25/13 0956 07/25/13 1540 07/26/13 0545 07/26/13 0845 07/26/13 2350  HGB 18.2* 18.7*  --   --   --   --   --   --   HCT 51.2 55.0*  --   --   --   --   --   --   PLT 192  --   --   --   --   --   --   --   APTT 29  --   --   --   --   --   --  66*  HEPARINUNFRC  --   --   --   --   --   --  >2.20*  --   CREATININE 0.98 1.10  --   --   --  1.02  --   --   TROPONINI  --   --   < > 0.79* 1.06* 1.00*  --   --   < > = values in this interval not displayed.  Estimated Creatinine Clearance: 93.7 ml/min (by C-G formula based on Cr of 1.02).   Medications:  Heparin 1300 units/hr  Assessment: 63 y/o M on with afib started on Xarelto, now on heparin with plans for possible cath. Baseline HL was >2.2, so will dose using aPTT for now, which is 66 on 1300 units/hr of heparin (goal 66-102). Other labs as above.   Goal of Therapy:  Heparin level 0.3-0.7 units/ml aPTT 66-102 seconds Monitor platelets by anticoagulation protocol: Yes   Plan:  -Increase heparin drip to 1400 units/hr given pt is barely therapeutic at aPTT of 66 -0800 aPTT -Daily aPTT, CBC -Would re-check heparin level 1/27 AM to see if correlating with aPTT -Monitor for bleeding -F/U cath plans  Abran Duke 07/27/2013,1:08 AM

## 2013-07-28 DIAGNOSIS — I739 Peripheral vascular disease, unspecified: Secondary | ICD-10-CM

## 2013-07-28 DIAGNOSIS — I4891 Unspecified atrial fibrillation: Secondary | ICD-10-CM

## 2013-07-28 DIAGNOSIS — E785 Hyperlipidemia, unspecified: Secondary | ICD-10-CM

## 2013-07-28 DIAGNOSIS — I10 Essential (primary) hypertension: Secondary | ICD-10-CM

## 2013-07-28 DIAGNOSIS — I214 Non-ST elevation (NSTEMI) myocardial infarction: Secondary | ICD-10-CM

## 2013-07-28 LAB — BASIC METABOLIC PANEL
BUN: 26 mg/dL — ABNORMAL HIGH (ref 6–23)
CO2: 27 mEq/L (ref 19–32)
Calcium: 8.5 mg/dL (ref 8.4–10.5)
Chloride: 101 mEq/L (ref 96–112)
Creatinine, Ser: 0.92 mg/dL (ref 0.50–1.35)
GFR calc Af Amer: >90 mL/min (ref >90)
GFR, EST NON AFRICAN AMERICAN: 90 mL/min — AB (ref >90)
Glucose, Bld: 233 mg/dL — ABNORMAL HIGH (ref 70–99)
Potassium: 4.2 mEq/L (ref 3.7–5.3)
SODIUM: 138 meq/L (ref 137–147)

## 2013-07-28 LAB — GLUCOSE, CAPILLARY
Glucose-Capillary: 148 mg/dL — ABNORMAL HIGH (ref 70–99)
Glucose-Capillary: 170 mg/dL — ABNORMAL HIGH (ref 70–99)

## 2013-07-28 MED ORDER — RIVAROXABAN 20 MG PO TABS: 20.0000 mg | ORAL_TABLET | Freq: Every day | ORAL | Status: AC

## 2013-07-28 MED ORDER — POTASSIUM CHLORIDE ER 10 MEQ PO TBCR: 10.0000 meq | EXTENDED_RELEASE_TABLET | Freq: Two times a day (BID) | ORAL | Status: DC

## 2013-07-28 MED ORDER — INSULIN GLARGINE 100 UNIT/ML ~~LOC~~ SOLN: 34.0000 [IU] | Freq: Every day | SUBCUTANEOUS | Status: DC

## 2013-07-28 MED ORDER — RAMIPRIL 10 MG PO CAPS: 10.0000 mg | ORAL_CAPSULE | Freq: Every day | ORAL | Status: DC

## 2013-07-28 MED ORDER — FUROSEMIDE 40 MG PO TABS: 40.0000 mg | ORAL_TABLET | Freq: Every day | ORAL | Status: DC

## 2013-07-28 MED ORDER — ATORVASTATIN CALCIUM 20 MG PO TABS: 20.0000 mg | ORAL_TABLET | Freq: Every day | ORAL | Status: DC

## 2013-07-28 MED ORDER — RIVAROXABAN 20 MG PO TABS: 20.0000 mg | ORAL_TABLET | Freq: Every day | ORAL | Status: DC

## 2013-07-28 MED ORDER — CARVEDILOL 25 MG PO TABS: 37.5000 mg | ORAL_TABLET | Freq: Two times a day (BID) | ORAL | Status: DC

## 2013-07-28 NOTE — Progress Notes (Signed)
I spoke again with [name] regarding Heart failure.  I reviewed the education given at my last visit.  He verbalizes understanding of daily weights, signs and symptoms of heart failure, when to call the physician, fluid restriction and low sodium diet.  We talked for a while about medications and his plan to get all of his meds and take them as prescribed.  He does have a new insurance card that will enable him to get generics cheaply.  He does not anticipate any issues acquirig his medications at discharge.  Driscilla Moats RN, BSN, PCCN--Heart Failure Navigator

## 2013-07-28 NOTE — Discharge Summary (Signed)
Physician Discharge Summary  William Pacheco:[id number] DOB: 07-15-1950 DOA: 07/25/2013  PCP: Philis Fendt, MD  Admit date: 07/25/2013 Discharge date: 07/28/2013  Time spent: greater than 30 min  Recommendations for Outpatient Follow-up:  1. Monitor BMET, blood pressure, diabetes control, weights  Discharge Diagnoses:  Principal Problem:   Acute respiratory failure Active Problems:   Ischemic cardiomyopathy   CAD- CABG x 6 9/08- Myoview 5/13 with scar, no ischemia   DM type 2, uncontrolled, with neuropathy   PAF- initial presentation with RVR, and heart failure   Atherosclerosis of native arteries of the extremities with intermittent claudication   PAD - R Fem-Pop, L SFA stent   Hyperlipidemia   Noncompliance   Hypertension   Anticoagulation adequate, on Xarelto   Acute on chronic systolic heart failure   NSTEMI - Troponin 1.06   Elevated troponin   Discharge Condition: stable  Filed Weights   07/26/13 0508 07/27/13 0547 07/28/13 0531  Weight: 102.059 kg (225 lb) 102.105 kg (225 lb 1.6 oz) 103.012 kg (227 lb 1.6 oz)    History of present illness:  63 y.o. male who presents to the ED with respiratory distress. Symptoms suddenly onset at 1 am this morning without warning. No chest pain. Patient presented to the ED essentially in respiratory failure, was placed on rescue BIPAP. His initial BP was 197/112, after putting the patient on BIPAP and giving him 80 of lasix, his breathing is significantly improved and his BP is down to 116/72. Hospitalist asked to admit. CXR showed CHF.  Patient had been of meds for many months  Hospital Course:  Admitted to SDU, started on IV lasix, antihypertensives. Able to come of BiPAP quickly.  Had no CP or EKG changes, but troponins peaked above 1. Also Echo significantly worse from previous, so cardiology was consulted. IV heparin started temporarily. Ultimately, NSTEMI felt to be related to respiratory failure, acute CHF, hypertensive  emergency, and decided against cardiac cath.  ACE inhibitor, beta blocker, statin, xarelto and insulin resumed.  By discharge, breathing at baseline, and BP and CBGs well controlled.  Compliance stressed. CM consulted to ensure affordability of outpt meds.  Pt now has insurance, so hopefully, compliance will be less problematic.  Procedures:  NIPPV  Consultations:  cardiology  Discharge Exam: Filed Vitals:   07/28/13 1110  BP: 114/69  Pulse:   Temp:   Resp:     General: comfortable Cardiovascular: RRR Respiratory: CTA Ext trace edema  Discharge Instructions  Discharge Orders   Future Appointments Provider Department Dept Phone   08/21/2013 1:30 PM [facility] [phone]   08/21/2013 2:30 PM [facility] [address] [phone]   08/21/2013 3:00 PM Sharmon Leyden Nickel, NP [facility] [phone]   Future Orders Complete By Expires   Activity as tolerated - No restrictions  As directed    Diet - low sodium heart healthy  As directed    Diet Carb Modified  As directed    Discharge instructions  As directed    Comments:     Monitor blood glucose at least daily       Medication List    STOP taking these medications       aspirin 81 MG EC tablet     cilostazol 100 MG tablet  Commonly known as:  PLETAL     diltiazem 240 MG 24 hr capsule  Commonly known as:  CARDIZEM CD  gabapentin 300 MG capsule  Commonly known as:  NEURONTIN     GLUCOSAMINE 1500 COMPLEX PO     insulin aspart 100 UNIT/ML injection  Commonly known as:  novoLOG     INVOKANA 100 MG Tabs  Generic drug:  Canagliflozin     ONE TOUCH ULTRA MINI W/DEVICE Kit     pregabalin 100 MG capsule  Commonly known as:  LYRICA      TAKE these medications       atorvastatin 20 MG tablet  Commonly known as:  LIPITOR  Take 1 tablet (20 mg total) by mouth daily at 6 PM.     carvedilol 25 MG tablet   Commonly known as:  COREG  Take 1.5 tablets (37.5 mg total) by mouth 2 (two) times daily with a meal. 1 and 1/2 tablets     furosemide 40 MG tablet  Commonly known as:  LASIX  Take 1 tablet (40 mg total) by mouth daily.     insulin glargine 100 UNIT/ML injection  Commonly known as:  LANTUS  Inject 0.34 mLs (34 Units total) into the skin at bedtime.     potassium chloride 10 MEQ tablet  Commonly known as:  K-DUR  Take 1 tablet (10 mEq total) by mouth 2 (two) times daily.     ramipril 10 MG capsule  Commonly known as:  ALTACE  Take 1 capsule (10 mg total) by mouth daily.     Rivaroxaban 20 MG Tabs tablet  Commonly known as:  XARELTO  Take 1 tablet (20 mg total) by mouth daily with supper.       No Known Allergies     Follow-up Information   Follow up with Philis Fendt, MD.   Specialty:  Internal Medicine   Contact information:   [place] [address] [phone]       Follow up with Leonie Man, MD.   Specialty:  Cardiology   Contact information:   [address] [place] [address] [phone]        The results of significant diagnostics from this hospitalization (including imaging, microbiology, ancillary and laboratory) are listed below for reference.    Significant Diagnostic Studies: Dg Chest Portable 1 View  07/25/2013   CLINICAL DATA:  Respiratory distress  EXAM: PORTABLE CHEST - 1 VIEW  COMPARISON:  09/24/2011  FINDINGS: Previous median sternotomy and CABG procedure. Mild cardiac enlargement is noted. Diffuse bilateral hazy lung opacities are identified right greater than left. The visualized osseous structures are unremarkable.  IMPRESSION: Moderate severe pulmonary edema.   Electronically Signed   By: Kerby Moors M.D.   On: 07/25/2013 01:35   ekg Sinus tachycardia Atrial premature complex Probable left atrial enlargement Borderline intraventricular conduction delay Abnormal T, consider ischemia, lateral  leads  Echo Left ventricle: The cavity size was normal. Wall thickness was increased in a pattern of mild LVH. Systolic function was severely reduced. The estimated ejection fraction was in the range of 15% to 20%. There is akinesis of the basalinferolateral and inferoseptal myocardium. There is akinesis of the basal-midinferior myocardium. Doppler parameters are consistent with restrictive physiology, indicative of decreased left ventricular diastolic compliance and/or increased left atrial pressure. Doppler parameters are consistent with elevated ventricular end-diastolic filling pressure. - Aortic valve: Mildly calcified annulus. Trileaflet. No significant regurgitation. - Mitral valve: Calcification, with involvement of posterior leaflet chords. There is restricted posterior leaflet motion. Moderate regurgitation directed eccentrically. PISA calculations suggest moderate to severe mitral regurgitation with ERO 32  mm2 and RV 52 ml. - Left atrium: The atrium was moderately dilated. - Right ventricle: Systolic function was mildly to moderately reduced. - Tricuspid valve: Mild regurgitation. - Pulmonary arteries: Systolic pressure could not be accurately estimated. Poor TR Doppler envelope. - Inferior vena cava: Not visualized. Unable to estimate CVP. - Pericardium, extracardiac: There was no pericardial effusion. Impressions:  - Mild LVH with decline in LVEF compared to prior study, now in range of 15-20%, diffuse hypokinesis with inferolateral wall motion abnormalities consistent with ischemic heart disease (mixed cardiomyopathy not excluded). Moderate left atrial enlargement. Thickened mitral valve with posterior chordal calcification and restricted motion. Probable ischemic mitral regurgitation of at least moderate degree (see above). Mildly to moderately reduced RV contraction. Mild tricuspid regurgitation - unable to assess PASP.  Microbiology: Recent Results (from  the past 240 hour(s))  CULTURE, BLOOD (ROUTINE X 2)     Status: None   Collection Time    07/25/13  2:20 AM      Result Value Range Status   Specimen Description BLOOD RIGHT HAND   Final   Special Requests BOTTLES DRAWN AEROBIC ONLY 10CCS   Final   Culture  Setup Time     Final   Value: 07/25/2013 14:43     Performed at [facility]   Culture     Final   Value:        BLOOD CULTURE RECEIVED NO GROWTH TO DATE CULTURE WILL BE HELD FOR 5 DAYS BEFORE ISSUING A FINAL NEGATIVE REPORT     Performed at [facility]   Report Status PENDING   Incomplete  CULTURE, BLOOD (ROUTINE X 2)     Status: None   Collection Time    07/25/13  2:30 AM      Result Value Range Status   Specimen Description BLOOD LEFT HAND   Final   Special Requests BOTTLES DRAWN AEROBIC ONLY 3CCS   Final   Culture  Setup Time     Final   Value: 07/25/2013 14:43     Performed at [facility]   Culture     Final   Value:        BLOOD CULTURE RECEIVED NO GROWTH TO DATE CULTURE WILL BE HELD FOR 5 DAYS BEFORE ISSUING A FINAL NEGATIVE REPORT     Performed at [facility]   Report Status PENDING   Incomplete  MRSA PCR SCREENING     Status: None   Collection Time    07/25/13  6:22 AM      Result Value Range Status   MRSA by PCR NEGATIVE  NEGATIVE Final   Comment:            The GeneXpert MRSA Assay (FDA     approved for NASAL specimens     only), is one component of a     comprehensive MRSA colonization     surveillance program. It is not     intended to diagnose MRSA     infection nor to guide or     monitor treatment for     MRSA infections.     Labs: Basic Metabolic Panel:  Recent Labs Lab 07/25/13 0235 07/25/13 0258 07/26/13 0545 07/27/13 0348 07/28/13 0909  NA 138 136* 137 139 138  K 4.7 4.3 4.1 3.4* 4.2  CL 99 105 99 100 101  CO2 22  --  _0 GLUCOSE 456* 466* 204* 116* 233*  BUN 21 24* 31* 32* 26*  CREATININE 0.98 1.10 1.02 0.93 0.92  CALCIUM 8.8  --  8.9 8.6 8.5    Liver Function Tests:  Recent Labs Lab 07/25/13 0235  AST 33  ALT 49  ALKPHOS 69  BILITOT 0.7  PROT 6.9  ALBUMIN 3.5   No results found for this basename: LIPASE, AMYLASE,  in the last 168 hours No results found for this basename: AMMONIA,  in the last 168 hours CBC:  Recent Labs Lab 07/25/13 0235 07/25/13 0258 07/27/13 0348  WBC 12.5*  --  8.7  NEUTROABS 9.8*  --   --   HGB 18.2* 18.7* 15.1  HCT 51.2 55.0* 42.6  MCV 85.9  --  85.5  PLT 192  --  183   Cardiac Enzymes:  Recent Labs Lab 07/25/13 0605 07/25/13 0956 07/25/13 1540 07/26/13 0545  TROPONINI 0.36* 0.79* 1.06* 1.00*   BNP: BNP (last 3 results)  Recent Labs  07/25/13 0235  PROBNP 949.9*   CBG:  Recent Labs Lab 07/27/13 1144 07/27/13 1625 07/27/13 2133 07/28/13 0645 07/28/13 1123  GLUCAP 123* 261* 116* 148* 170*   Signed:  Bladen Umar L  [facility] 07/28/2013, 1:07 PM

## 2013-07-28 NOTE — Progress Notes (Signed)
All d/c instructions explained and given to pt by Dorann Lodge, Forensic scientist.  D/c off floor via w/c to awaiting transportation to home.  Amanda Pea, Charity fundraiser.

## 2013-07-28 NOTE — Progress Notes (Signed)
Subjective: Feeling much better. Denies SOB and CP.   Objective: Vital signs in last 24 hours: Temp:  [97.9 F (36.6 C)-98.1 F (36.7 C)] 98.1 F (36.7 C) (01/27 0531) Pulse Rate:  [71-84] 77 (01/27 0531) Resp:  [18-20] 20 (01/27 0531) BP: (87-113)/(55-68) 113/68 mmHg (01/27 0531) SpO2:  [98 %-100 %] 100 % (01/27 0531) Weight:  [227 lb 1.6 oz (103.012 kg)] 227 lb 1.6 oz (103.012 kg) (01/27 0531) Last BM Date: 07/27/13  Intake/Output from previous day: 01/26 0701 - 01/27 0700 In: 1440 [P.O.:1440] Out: 1800 [Urine:1800] Intake/Output this shift:    Medications Current Facility-Administered Medications  Medication Dose Route Frequency Provider Last Rate Last Dose  . 0.9 %  sodium chloride infusion  250 mL Intravenous PRN Hillary Bow, DO      . 0.9 %  sodium chloride infusion  250 mL Intravenous PRN Donato Schultz, MD      . 0.9 %  sodium chloride infusion   Intravenous Continuous Tonny Bollman, MD 10 mL/hr at 07/27/13 0849 10 mL/hr at 07/27/13 0849  . acetaminophen (TYLENOL) tablet 650 mg  650 mg Oral Q4H PRN Hillary Bow, DO      . atorvastatin (LIPITOR) tablet 20 mg  20 mg Oral q1800 Hillary Bow, DO   20 mg at 07/27/13 1707  . carvedilol (COREG) tablet 37.5 mg  37.5 mg Oral BID WC Christiane Ha, MD   37.5 mg at 07/27/13 1707  . furosemide (LASIX) tablet 40 mg  40 mg Oral Daily Lyn Records III, MD      . insulin aspart (novoLOG) injection 0-20 Units  0-20 Units Subcutaneous TID WC Christiane Ha, MD   3 Units at 07/28/13 0700  . insulin aspart (novoLOG) injection 0-5 Units  0-5 Units Subcutaneous QHS Christiane Ha, MD   2 Units at 07/26/13 2128  . insulin aspart (novoLOG) injection 4 Units  4 Units Subcutaneous TID WC Christiane Ha, MD   4 Units at 07/27/13 1708  . insulin glargine (LANTUS) injection 28 Units  28 Units Subcutaneous QHS Christiane Ha, MD   28 Units at 07/27/13 2154  . ondansetron (ZOFRAN) injection 4 mg  4 mg Intravenous Q6H  PRN Hillary Bow, DO      . potassium chloride (K-DUR) CR tablet 10 mEq  10 mEq Oral BID Hillary Bow, DO   10 mEq at 07/27/13 2154  . ramipril (ALTACE) capsule 10 mg  10 mg Oral Daily Christiane Ha, MD   10 mg at 07/27/13 1112  . Rivaroxaban (XARELTO) tablet 20 mg  20 mg Oral Q supper Christiane Ha, MD   20 mg at 07/27/13 1340  . sodium chloride 0.9 % injection 3 mL  3 mL Intravenous Q12H Hillary Bow, DO   3 mL at 07/26/13 1041  . sodium chloride 0.9 % injection 3 mL  3 mL Intravenous PRN Hillary Bow, DO      . sodium chloride 0.9 % injection 3 mL  3 mL Intravenous Q12H Donato Schultz, MD   3 mL at 07/27/13 2156  . sodium chloride 0.9 % injection 3 mL  3 mL Intravenous PRN Donato Schultz, MD        PE: General appearance: alert, cooperative and no distress Neck: no JVD Lungs: clear to auscultation bilaterally Heart: regular rate and rhythm Extremities: no LEE Pulses: 2+ and symmetric Skin: warm and dry Neurologic: Grossly normal  Lab Results:  Recent Labs  07/27/13 0348  WBC 8.7  HGB 15.1  HCT 42.6  PLT 183   BMET  Recent Labs  07/26/13 0545 07/27/13 0348  NA 137 139  K 4.1 3.4*  CL 99 100  CO2 23 26  GLUCOSE 204* 116*  BUN 31* 32*  CREATININE 1.02 0.93  CALCIUM 8.9 8.6   PT/INR  Recent Labs  07/27/13 0830  LABPROT 14.2  INR 1.12   Cardiac Panel (last 3 results)  Recent Labs  07/25/13 0956 07/25/13 1540 07/26/13 0545  TROPONINI 0.79* 1.06* 1.00*      Assessment/Plan    Principal Problem:   Acute respiratory failure Active Problems:   Atherosclerosis of native arteries of the extremities with intermittent claudication   PAD - R Fem-Pop, L SFA stent   Hypertension   CAD- CABG x 6 9/08- Myoview 5/13 with scar, no ischemia   Hyperlipidemia   DM type 2, uncontrolled, with neuropathy   Anticoagulation adequate, on Xarelto   PAF- initial presentation with RVR, and heart failure   Ischemic cardiomyopathy   Acute on chronic  systolic heart failure   Noncompliance   NSTEMI - Troponin 1.06   Elevated troponin  [facility], as it was felt that his elevated troponin was secondary to Type II NSTEMI, from pressure/volume overload from recent medication non-compliance. His SOB has improved after significant diuresis. Net negative 5.3 L. No chest pain. HR and BP stable. He appears euvolemic on exam. He is on PO Lasix.  Discharge home today. Recommend f/u BMP to reassess electrolyte status as he was hypokalemic yesterday. Replete if needed. Resume 40 mg PO Lasix, BB, ACE-I and statin. Xarelto for PAF. If home today, we will arrange f/u with Dr. Herbie BaltimoreHarding.  Brittainy M. Delmer IslamSimmons, PA-C 07/28/2013 8:14 AM  OK for discharge home today.     Jerral BonitoJeff Akul Leggette, MD

## 2013-07-31 ENCOUNTER — Ambulatory Visit: Payer: 59 | Admitting: Family

## 2013-07-31 ENCOUNTER — Other Ambulatory Visit (HOSPITAL_COMMUNITY): Payer: 59

## 2013-07-31 ENCOUNTER — Encounter (HOSPITAL_COMMUNITY): Payer: 59

## 2013-07-31 LAB — CULTURE, BLOOD (ROUTINE X 2)
Culture: NO GROWTH
Culture: NO GROWTH

## 2013-08-06 ENCOUNTER — Telehealth: Payer: Self-pay | Admitting: Cardiology

## 2013-08-06 ENCOUNTER — Ambulatory Visit (INDEPENDENT_AMBULATORY_CARE_PROVIDER_SITE_OTHER): Payer: No Typology Code available for payment source | Admitting: Cardiology

## 2013-08-06 VITALS — BP 104/72 | HR 75 | Ht 71.0 in | Wt 233.0 lb

## 2013-08-06 DIAGNOSIS — I517 Cardiomegaly: Secondary | ICD-10-CM

## 2013-08-06 DIAGNOSIS — I2510 Atherosclerotic heart disease of native coronary artery without angina pectoris: Secondary | ICD-10-CM

## 2013-08-06 DIAGNOSIS — Z9119 Patient's noncompliance with other medical treatment and regimen: Secondary | ICD-10-CM

## 2013-08-06 DIAGNOSIS — I4891 Unspecified atrial fibrillation: Secondary | ICD-10-CM

## 2013-08-06 DIAGNOSIS — I5023 Acute on chronic systolic (congestive) heart failure: Secondary | ICD-10-CM

## 2013-08-06 DIAGNOSIS — I2589 Other forms of chronic ischemic heart disease: Secondary | ICD-10-CM

## 2013-08-06 DIAGNOSIS — Z91199 Patient's noncompliance with other medical treatment and regimen due to unspecified reason: Secondary | ICD-10-CM

## 2013-08-06 DIAGNOSIS — I480 Paroxysmal atrial fibrillation: Secondary | ICD-10-CM

## 2013-08-06 DIAGNOSIS — I10 Essential (primary) hypertension: Secondary | ICD-10-CM

## 2013-08-06 DIAGNOSIS — Z7901 Long term (current) use of anticoagulants: Secondary | ICD-10-CM

## 2013-08-06 DIAGNOSIS — I255 Ischemic cardiomyopathy: Secondary | ICD-10-CM

## 2013-08-06 NOTE — Telephone Encounter (Signed)
Returned call and pt verified x 2.  Pt stated his daughter has some questions for Brittainy.  Pt put daughter, Shawna Orleans, on the phone.  Stated she is finished medical school and has some questions about pt's treatment plan.  Stated pt mentioned something about a defibrillator and she wanted to get more information.  Stated it may be easier for her to talk w/ Brittainy directly.  Daughter/pt informed Avon Gully is still in clinic seeing patients and will be notified.  Pt gave verbal permission to discuss care w/ daughter, Shawna Orleans.  Informed Avon Gully will be notified.  Verbalized understanding.  Melanie's number: [phone].  Message forwarded to [employer], PA-C.

## 2013-08-06 NOTE — Telephone Encounter (Signed)
Saw [place] this morning ,he wants to talk to [place].

## 2013-08-08 ENCOUNTER — Encounter: Payer: Self-pay | Admitting: Cardiology

## 2013-08-08 NOTE — Assessment & Plan Note (Signed)
NSR on EKG. HR 75 bmp. He denies any recent symptoms of palpitations, SOB, dizziness. On BB for rate control. Continue Xarelto for stroke prophylaxis. Samples of Xarelto were given today.

## 2013-08-08 NOTE — Assessment & Plan Note (Signed)
Well controlled today at 104/72. No change in therapy.

## 2013-08-08 NOTE — Assessment & Plan Note (Addendum)
Euvolemic on physical exam today. Denies SOB, weight gain, LEE, orthopnea/PND. Pt reports medication compliance. Will continue on current HF meds including BB, ACE-I and diuretic with daily potassium supplement. His BP is a bit soft so we will continue him on his current doses: 37.5 mg of Coreg BID, 10 mg Altace daily, 40 mg of Lasix + of potassium daily. Continue daily weights and low sodium diet.

## 2013-08-08 NOTE — Assessment & Plan Note (Addendum)
Denies Angina, but will order NST to evaluate for ischemia, in the setting of newly reduced systolic function.

## 2013-08-08 NOTE — Progress Notes (Signed)
Patient ID: William Pacheco, male   DOB: 02-26-1951, 63 y.o.   MRN: [id number]  08/08/2013 William Pacheco   [birth year]  [id number]  Primary Physicia Philis Fendt, MD Primary Cardiologist: Dr. Ellyn Pacheco  [name] presents to clinic today for post hospital f/u, following an admission for CHF. His history is outlined below.   HPI:  This is a 63 y.o. male followed by Dr William Pacheco with a past medical history significant for CAD. He had CABG x 6 in Sept 2008 with a LIMA-LAD, LRA-Dx1, SVG-MO1/OM2, SVG-PD/PL. He had a Myoview in May 2013 that showed LVD with an EF of 31% but no ischemia. EF by echocardiogram, during that time, revealed an EF of 40-45%. He had AF in 2013 and has been on Eliquis. He has PVD and has had multiple procedures on his lower extremities.   The patient presented to [facility] on 07/25/13 with a complaint of SOB. A history taken in the ER revealed that he had been medically noncompliant. 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 For some reason his wife dropped him from her insurance. The pt says he couldn't afford his medication and has been out for 5 weeks. He reported sudden onset SOB. In the ER he required Bi Pap. His initial blood pressure was 197/112. He denied any chest pain. His Troponin was positive at 1.06. He was given 80 mg IV lasix and both his breathing and BP improved. He was admitted by Internal Medicine. He diuresed more than 4 L in the first 24 hrs of admission. A LCH was initially considered, however it was canceled because it was felt that the pattern of his troponin elevations along with his presentation was compatible with Type II NSTEMI related to medication non-compliance and pressure/volume overload.   It appears that a 2D echo was obtained on 07/26/13 which demonstrated a severe reduction in his EF, compared to prior study in September 2014. His EF was reduced to 15-20%, down from 40-45%.  There was diffuse hypokinesis with inferolateral wall motion  abnormalities consistent with ischemic heart disease (mixed cardiomyopathy not excluded). Moderate left atrial enlargement. Thickened mitral valve with posterior chordal calcification and restricted motion. Probable ischemic mitral regurgitation of at least moderate degree. Mildly to moderately reduced RV contraction.  Neither a NST nor a LifeVest were considered prior to discharge. He was discharged home from [facility] on 07/28/13.  He returns to clinic today stating that he has been doing well. He denies CP, SOB, weight gain, LEE, orthopnea, PND, palpitations, syncope and near syncope. He states that he now has insurance and has been compliant with his mediations. He reports compliance with a low sodium diet.   Current Outpatient Prescriptions  Medication Sig Dispense Refill  . carvedilol (COREG) 25 MG tablet Take 1.5 tablets (37.5 mg total) by mouth 2 (two) times daily with a meal. 1 and 1/2 tablets  90 tablet  0  . furosemide (LASIX) 40 MG tablet Take 1 tablet (40 mg total) by mouth daily.  30 tablet  0  . insulin glargine (LANTUS) 100 UNIT/ML injection Inject 35 Units into the skin at bedtime.      . potassium chloride (K-DUR) 10 MEQ tablet Take 1 tablet (10 mEq total) by mouth 2 (two) times daily.  60 tablet  0  . ramipril (ALTACE) 10 MG capsule Take 1 capsule (10 mg total) by mouth daily.  30 capsule  0  . Rivaroxaban (XARELTO) 20 MG TABS tablet Take  1 tablet (20 mg total) by mouth daily with supper.  30 tablet  0  . [DISCONTINUED] amLODipine (NORVASC) 10 MG tablet Take 10 mg by mouth daily.      . [DISCONTINUED] calcium carbonate (OS-CAL) 600 MG TABS Take 600 mg by mouth daily.      . [DISCONTINUED] pioglitazone-metformin (ACTOPLUS MET) 15-850 MG per tablet Take 1 tablet by mouth 2 (two) times daily with a meal.      . [DISCONTINUED] simvastatin (ZOCOR) 40 MG tablet Take 40 mg by mouth every evening.       No current facility-administered medications for this visit.    No Known  Allergies  History   Social History  . Marital Status: Married    Spouse Name: N/A    Number of Children: 3  . Years of Education: N/A   Occupational History  . Not on file.   Social History Main Topics  . Smoking status: Former Smoker -- 0.50 packs/day    Types: Cigarettes    Start date: 07/02/1973    Quit date: 07/02/2005  . Smokeless tobacco: Never Used  . Alcohol Use: No  . Drug Use: No  . Sexual Activity: Yes   Other Topics Concern  . Not on file   Social History Narrative   He is a married father of 76, grandfather 1. Does not routinely exercise, does not drink or smoke alcohol or he works as a Retail buyer, and has trouble making ends meet.     Review of Systems: General: negative for chills, fever, night sweats or weight changes.  Cardiovascular: negative for chest pain, dyspnea on exertion, edema, orthopnea, palpitations, paroxysmal nocturnal dyspnea or shortness of breath Dermatological: negative for rash Respiratory: negative for cough or wheezing Urologic: negative for hematuria Abdominal: negative for nausea, vomiting, diarrhea, bright red blood per rectum, melena, or hematemesis Neurologic: negative for visual changes, syncope, or dizziness All other systems reviewed and are otherwise negative except as noted above.    Blood pressure 104/72, pulse 75, height _0  (1.803 m), weight 233 lb (105.688 kg).  General appearance: alert, cooperative and no distress Neck: no carotid bruit and no JVD Lungs: clear to auscultation bilaterally Heart: regular rate and rhythm, S1, S2 William, no murmur, click, rub or gallop Extremities: no LEE Pulses: 2+ and symmetric Skin: warm and dry Neurologic: Grossly William  EKG NSR, HR: 75 bmp  ASSESSMENT AND PLAN:   Acute on chronic systolic heart failure Euvolemic on physical exam today. Denies SOB, weight gain, LEE, orthopnea/PND. Pt reports medication compliance. Will continue on current HF meds including BB, ACE-I and  diuretic with daily potassium supplement. His BP is a bit soft so we will continue him on his current doses: 37.5 mg of Coreg BID, 10 mg Altace daily, 40 mg of Lasix + 47mq of potassium daily. Continue daily weights and low sodium diet.   CAD- CABG x 6 9/08- Myoview 5/13 with scar, no ischemia Denies Angina, but will order NST to evaluate for ischemia, in the setting of newly reduced systolic function.   Ischemic cardiomyopathy Now with newly reduced EF, compared to prior study 5 months ago. EF now 15-20% with WMA. I have contacted [employer] representative to fit for LifeVest for prevention of SCD. Will also order NST to assess for ischemia to explain reduction in systolic function. Continue ACE-I and BB. Continue diuretic to prevent CHF exacerbations.  Hypertension Well controlled today at 104/72. No change in therapy.  PAF (paroxysmal atrial fibrillation) NSR on EKG.  HR 75 bmp. He denies any recent symptoms of palpitations, SOB, dizziness. On BB for rate control. Continue Xarelto for stroke prophylaxis. Samples of Xarelto were given today.  Anticoagulation adequate, on Xarelto Reports daily compliance. Denies any abnormal bleeding. Samples were given to patient today.  Noncompliance Pt now reports daily compliance. He now has insurance. Discussion was held on the importance of medication compliance to preserve heath and to reduce number or rehospitalization's. Pt notified to contact our office if he runs out of meds. Pt was provided samples of Xarelto.     PLAN  Pt instructed to continue current meds for HF, CAD, HTN and PAF. I have ordered a LifeVest to prevent SCD. I have also ordered a NST to evaluate for ischemia to assess for any reversible causes of his cardiomyopathy. Dr. Ellyn Pacheco has been notified and agrees to the above plan. If his systolic function does not improve to > 35% in 3 months, then he will need to be considered for an ICD. Will have him follow-up with Dr. Ellyn Pacheco in 4 weeks or  sooner if NST reveals that he will need a LHC.  Arvilla Market 08/08/2013 5:31 PM

## 2013-08-08 NOTE — Assessment & Plan Note (Addendum)
Now with newly reduced EF, compared to prior study 5 months ago. EF now 15-20% with WMA. I have contacted [employer] representative to fit for LifeVest for prevention of SCD. Will also order NST to assess for ischemia to explain reduction in systolic function. Continue ACE-I and BB. Continue diuretic to prevent CHF exacerbations.

## 2013-08-08 NOTE — Assessment & Plan Note (Signed)
Reports daily compliance. Denies any abnormal bleeding. Samples were given to patient today.

## 2013-08-08 NOTE — Assessment & Plan Note (Signed)
Pt now reports daily compliance. He now has insurance. Discussion was held on the importance of medication compliance to preserve heath and to reduce number or rehospitalization's. Pt notified to contact our office if he runs out of meds. Pt was provided samples of Xarelto.

## 2013-08-10 ENCOUNTER — Telehealth: Payer: Self-pay | Admitting: Cardiology

## 2013-08-10 ENCOUNTER — Encounter: Payer: Self-pay | Admitting: Cardiology

## 2013-08-10 NOTE — Telephone Encounter (Signed)
Returned call.  Left message on pt-identified voicemail that no letter ready and Brittainy will be notified.  Also to call back before 4pm if questions.  Message forwarded to [employer], PA-C.

## 2013-08-10 NOTE — Telephone Encounter (Signed)
Pt said he talked to [place] on Thursday and told her he needed a letter for work. He wants to know if it is ready?

## 2013-08-13 ENCOUNTER — Ambulatory Visit (HOSPITAL_COMMUNITY)
Admission: RE | Admit: 2013-08-13 | Discharge: 2013-08-13 | Disposition: A | Discharge status: Home / Self Care | Payer: No Typology Code available for payment source | Source: Ambulatory Visit | Attending: Internal Medicine | Admitting: Internal Medicine

## 2013-08-13 DIAGNOSIS — E119 Type 2 diabetes mellitus without complications: Secondary | ICD-10-CM | POA: Insufficient documentation

## 2013-08-13 DIAGNOSIS — I517 Cardiomegaly: Secondary | ICD-10-CM

## 2013-08-13 DIAGNOSIS — I2510 Atherosclerotic heart disease of native coronary artery without angina pectoris: Secondary | ICD-10-CM | POA: Insufficient documentation

## 2013-08-13 DIAGNOSIS — R0609 Other forms of dyspnea: Secondary | ICD-10-CM | POA: Insufficient documentation

## 2013-08-13 DIAGNOSIS — Z951 Presence of aortocoronary bypass graft: Secondary | ICD-10-CM | POA: Insufficient documentation

## 2013-08-13 DIAGNOSIS — E785 Hyperlipidemia, unspecified: Secondary | ICD-10-CM | POA: Insufficient documentation

## 2013-08-13 DIAGNOSIS — R0989 Other specified symptoms and signs involving the circulatory and respiratory systems: Principal | ICD-10-CM | POA: Insufficient documentation

## 2013-08-13 DIAGNOSIS — Z794 Long term (current) use of insulin: Secondary | ICD-10-CM | POA: Insufficient documentation

## 2013-08-13 DIAGNOSIS — I252 Old myocardial infarction: Secondary | ICD-10-CM | POA: Insufficient documentation

## 2013-08-13 DIAGNOSIS — I4891 Unspecified atrial fibrillation: Secondary | ICD-10-CM | POA: Insufficient documentation

## 2013-08-13 DIAGNOSIS — E669 Obesity, unspecified: Secondary | ICD-10-CM | POA: Insufficient documentation

## 2013-08-13 DIAGNOSIS — I10 Essential (primary) hypertension: Secondary | ICD-10-CM | POA: Insufficient documentation

## 2013-08-13 DIAGNOSIS — I739 Peripheral vascular disease, unspecified: Secondary | ICD-10-CM | POA: Insufficient documentation

## 2013-08-13 DIAGNOSIS — Z87891 Personal history of nicotine dependence: Secondary | ICD-10-CM | POA: Insufficient documentation

## 2013-08-13 HISTORY — PX: NM MYOVIEW LTD: HXRAD82

## 2013-08-13 MED ORDER — TECHNETIUM TC 99M SESTAMIBI GENERIC - CARDIOLITE
30.9000 | Freq: Once | INTRAVENOUS | Status: AC | PRN
  Administered 2013-08-13: 30.9 via INTRAVENOUS

## 2013-08-13 MED ORDER — REGADENOSON 0.4 MG/5ML IV SOLN
0.4000 mg | Freq: Once | INTRAVENOUS | Status: AC
  Administered 2013-08-13: 0.4 mg via INTRAVENOUS

## 2013-08-13 MED ORDER — TECHNETIUM TC 99M SESTAMIBI GENERIC - CARDIOLITE
10.2000 | Freq: Once | INTRAVENOUS | Status: AC | PRN
  Administered 2013-08-13: 10 via INTRAVENOUS

## 2013-08-13 NOTE — Procedures (Addendum)
[facility] [address] [place] [address] [phone]  Cardiology Nuclear Med Study  William Pacheco is a 63 y.o. male     MRN : [id number]     DOB: 1951-03-27  Procedure Date: 08/13/2013  Nuclear Med Background Indication for Stress Test:  Graft Patency, Stent Patency and Post Hospital History:  CAD;MI;CABG X6-03/2007;PAF;ICM;LVH Cardiac Risk Factors: History of Smoking, Hypertension, IDDM Type 2, Lipids, Obesity and PVD  Symptoms:  DOE   Nuclear Pre-Procedure Caffeine/Decaff Intake:  8:00pm NPO After: 6:00am   IV Site: R Hand  IV 0.9% NS with Angio Cath:  22g  Chest Size (in):  42"  IV Started by: Emmit Pomfret, RN  Height: 5\' 11"  (1.803 m)  Cup Size: n/a  BMI:  Body mass index is 32.51 kg/(m^2). Weight:  233 lb (105.688 kg)   Tech Comments:  N/A    Nuclear Med Study 1 or 2 day study: 1 day  Stress Test Type:  Lexiscan  Order Authorizing Provider:  Bryan Lemma, MD   Resting Radionuclide: Technetium 32m Sestamibi  Resting Radionuclide Dose: 10.2 mCi   Stress Radionuclide:  Technetium 28m Sestamibi  Stress Radionuclide Dose: 30.9 mCi           Stress Protocol Rest HR: 64 Stress HR: 80  Rest BP: 140/78 Stress BP: 134/81  Exercise Time (min): n/a METS: n/a   Predicted Max HR: 160 bpm % Max HR: 53.75 bpm Rate Pressure Product: 91916  Dose of Adenosine (mg):  n/a Dose of Lexiscan: 0.4 mg  Dose of Atropine (mg): n/a Dose of Dobutamine: n/a mcg/kg/min (at max HR)  Stress Test Technologist: Esperanza Sheets, CCT Nuclear Technologist: Gonzella Lex, CNMT   Rest Procedure:  Myocardial perfusion imaging was performed at rest 45 minutes following the intravenous administration of Technetium 6m Sestamibi. Stress Procedure:  The patient received IV Lexiscan 0.4 mg over 15-seconds.  Technetium 8m Sestamibi injected at 30-seconds.  There were no significant changes with Lexiscan.  Quantitative spect images were  obtained after a 45 minute delay.  Transient Ischemic Dilatation (Normal <1.22):  1.13 Lung/Heart Ratio (Normal <0.45):  0.38 QGS EDV:  198 ml QGS ESV:  137 ml LV Ejection Fraction: 31%     Rest ECG: NSR-LVH  Stress ECG: No significant change from baseline ECG  QPS Raw Data Images:  Moderate LV dilatation Stress Images:  Mildly reduced inferior and inferolateral tracer uptake. Rest Images:  Comparison with the stress images reveals no significant change. Subtraction (SDS):  No evidence of ischemia. LV Wall Motion:  Severe global hypokinesis, moderate to severe reduction in global LV systolic function.  Impression Exercise Capacity:  Lexiscan with no exercise. BP Response:  Normal blood pressure response. Clinical Symptoms:  No significant symptoms noted. ECG Impression:  No significant ECG changes with Lexiscan. Comparison with Prior Nuclear Study: No significant change from previous study   Overall Impression:  Abnormal nuclear study without high risk features and unchanged from 04/28/2013 . The inferior defect may represent nontransmural scar and/or diaphragmatic attenuation. 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   Thurmon Fair, MD  08/13/2013 12:32 PM

## 2013-08-14 NOTE — Progress Notes (Signed)
Quick Note:  Essentially unchanged result from 2013 - large area of infarction without suggestion of ischemia. On a good note - EF was more like 30-35% as opposed to the echo report of 15-20%. Probably due to diuresis & BP control. Will still need re-look Echo as scheduled to get full assessment of EF. In the absence of ongoing symptoms of angina or dyspnea, no need to proceed to [facility]. Can discuss symptoms at follow-up, unless he is noting worsening symptoms -- in which case, he needs to be seen sooner.  Marykay Lex, MD  ______

## 2013-08-20 ENCOUNTER — Encounter: Payer: Self-pay | Admitting: Family

## 2013-08-21 ENCOUNTER — Ambulatory Visit: Payer: 59 | Admitting: Family

## 2013-08-21 ENCOUNTER — Telehealth: Payer: Self-pay | Admitting: *Deleted

## 2013-08-21 ENCOUNTER — Other Ambulatory Visit (HOSPITAL_COMMUNITY): Payer: 59

## 2013-08-21 ENCOUNTER — Encounter (HOSPITAL_COMMUNITY): Payer: 59

## 2013-08-21 ENCOUNTER — Ambulatory Visit (HOSPITAL_COMMUNITY)
Admission: RE | Admit: 2013-08-21 | Discharge: 2013-08-21 | Disposition: A | Discharge status: Home / Self Care | Payer: No Typology Code available for payment source | Source: Ambulatory Visit | Attending: Family | Admitting: Family

## 2013-08-21 ENCOUNTER — Encounter: Payer: Self-pay | Admitting: Vascular Surgery

## 2013-08-21 ENCOUNTER — Ambulatory Visit (INDEPENDENT_AMBULATORY_CARE_PROVIDER_SITE_OTHER): Payer: No Typology Code available for payment source | Admitting: Family

## 2013-08-21 ENCOUNTER — Ambulatory Visit (INDEPENDENT_AMBULATORY_CARE_PROVIDER_SITE_OTHER)
Admission: RE | Admit: 2013-08-21 | Discharge: 2013-08-21 | Disposition: A | Discharge status: Home / Self Care | Payer: No Typology Code available for payment source | Source: Ambulatory Visit | Attending: Family | Admitting: Family

## 2013-08-21 ENCOUNTER — Encounter: Payer: Self-pay | Admitting: Family

## 2013-08-21 VITALS — BP 126/86 | HR 71 | Resp 16 | Ht 71.0 in | Wt 232.0 lb

## 2013-08-21 DIAGNOSIS — I739 Peripheral vascular disease, unspecified: Secondary | ICD-10-CM

## 2013-08-21 DIAGNOSIS — I70209 Unspecified atherosclerosis of native arteries of extremities, unspecified extremity: Secondary | ICD-10-CM | POA: Insufficient documentation

## 2013-08-21 DIAGNOSIS — I70219 Atherosclerosis of native arteries of extremities with intermittent claudication, unspecified extremity: Secondary | ICD-10-CM

## 2013-08-21 DIAGNOSIS — Z9889 Other specified postprocedural states: Secondary | ICD-10-CM | POA: Insufficient documentation

## 2013-08-21 DIAGNOSIS — Z48812 Encounter for surgical aftercare following surgery on the circulatory system: Secondary | ICD-10-CM

## 2013-08-21 DIAGNOSIS — M79609 Pain in unspecified limb: Secondary | ICD-10-CM

## 2013-08-21 DIAGNOSIS — M79673 Pain in unspecified foot: Secondary | ICD-10-CM | POA: Insufficient documentation

## 2013-08-21 NOTE — Telephone Encounter (Signed)
Message copied by Tobin Chad on Fri Aug 21, 2013 10:55 AM ------      Message from: Marykay Lex      Created: Fri Aug 14, 2013  3:53 PM       Essentially unchanged result from 2013 - large area of infarction without suggestion of ischemia.  On a good note - EF was more like 30-35% as opposed to the echo report of 15-20%.  Probably due to diuresis & BP control.  Will still need re-look Echo as scheduled to get full assessment of EF.  In the absence of ongoing symptoms of angina or dyspnea, no need to proceed to [facility].  Can discuss symptoms at follow-up, unless he is noting worsening symptoms -- in which case, he needs to be seen sooner.            Marykay Lex, MD       ------

## 2013-08-21 NOTE — Patient Instructions (Signed)
Angiography Angiography is a procedure used to look at the blood vessels that carry blood to different parts of your body (arteries). In this procedure, dye is injected through a long, thin tube (catheter) into an artery. X-rays are then taken. The X-rays will show if there is a blockage or problem in a blood vessel.  LET [facility] CARE PROVIDER KNOW ABOUT:  Any allergies you have, including allergies to shellfish or contrast dye.   All medicines you are taking, including vitamins, herbs, eye drops, creams, and over-the-counter medicines.   Previous problems you or members of your family have had with the use of anesthetics.   Any blood disorders you have.   Previous surgeries you have had.  Any previous kidney problems or failure you have had.  Medical conditions you have.   Possibility of pregnancy, if this applies. RISKS AND COMPLICATIONS Generally, angiography is a safe procedure. However, as with any procedure, complications can occur. Possible complications include:  Injury to the blood vessels, including rupture or bleeding.  Infection or bruising at the catheter site.  Allergic reaction to the dye or contrast used.  Kidney damage from the dye or contrast used.  Blood clots that can lead to a stroke or heart attack. BEFORE THE PROCEDURE  Do not eat or drink after midnight on the night before the procedure, or as directed by your health care provider.   Ask your health care provider if you may drink enough water to take any needed medicines the morning of the procedure.  PROCEDURE  You may be given a medicine to help you relax (sedative) before and during the procedure. This medicine is given through an IV access tube that is inserted into one of your veins.   The area where the catheter will be inserted will be washed and shaved. This is usually done in the groin but may be done in the fold of your arm (near your elbow) or in the wrist.  A medicine will be  given to numb the area where the catheter will be inserted (local anesthetic).  The catheter will be inserted with a guide wire into an artery. The catheter is guided by using a type of X-ray (fluoroscopy) to the blood vessel being examined.   Dye is then injected into the catheter, and X-rays are taken. The dye helps to show where any narrowing or blockages are located.  AFTER THE PROCEDURE   If the procedure is done through the leg, you will be kept in bed lying flat for several hours. You will be instructed to not bend or cross your legs.  The insertion site will be checked frequently.  The pulse in your feet or wrist will be checked frequently.  Additional blood tests, X-rays, and electrocardiography may be done.   You may need to stay in the hospital overnight for observation.  Document Released: 03/28/2005 Document Revised: 02/18/2013 Document Reviewed: 11/19/2012 [facility] Patient Information 2014 [place], [place].   Peripheral Vascular Disease Peripheral Vascular Disease (PVD), also called Peripheral Arterial Disease (PAD), is a circulation problem caused by cholesterol (atherosclerotic plaque) deposits in the arteries. PVD commonly occurs in the lower extremities (legs) but it can occur in other areas of the body, such as your arms. The cholesterol buildup in the arteries reduces blood flow which can cause pain and other serious problems. The presence of PVD can place a person at risk for Coronary Artery Disease (CAD).  CAUSES  Causes of PVD can be many. It is usually  associated with more than one risk factor such as:   High Cholesterol.  Smoking.  Diabetes.  Lack of exercise or inactivity.  High blood pressure (hypertension).  Obesity.  Family history. SYMPTOMS   When the lower extremities are affected, patients with PVD may experience:  Leg pain with exertion or physical activity. This is called INTERMITTENT CLAUDICATION. This may present as cramping or numbness  with physical activity. The location of the pain is associated with the level of blockage. For example, blockage at the abdominal level (distal abdominal aorta) may result in buttock or hip pain. Lower leg arterial blockage may result in calf pain.  As PVD becomes more severe, pain can develop with less physical activity.  In people with severe PVD, leg pain may occur at rest.  Other PVD signs and symptoms:  Leg numbness or weakness.  Coldness in the affected leg or foot, especially when compared to the other leg.  A change in leg color.  Patients with significant PVD are more prone to ulcers or sores on toes, feet or legs. These may take longer to heal or may reoccur. The ulcers or sores can become infected.  If signs and symptoms of PVD are ignored, gangrene may occur. This can result in the loss of toes or loss of an entire limb.  Not all leg pain is related to PVD. Other medical conditions can cause leg pain such as:  Blood clots (embolism) or Deep Vein Thrombosis.  Inflammation of the blood vessels (vasculitis).  Spinal stenosis. DIAGNOSIS  Diagnosis of PVD can involve several different types of tests. These can include:  Pulse Volume Recording Method (PVR). This test is simple, painless and does not involve the use of X-rays. PVR involves measuring and comparing the blood pressure in the arms and legs. An ABI (Ankle-Brachial Index) is calculated. The normal ratio of blood pressures is 1. As this number becomes smaller, it indicates more severe disease.  < 0.95  indicates significant narrowing in one or more leg vessels.  <0.8 there will usually be pain in the foot, leg or buttock with exercise.  <0.4 will usually have pain in the legs at rest.  <0.25  usually indicates limb threatening PVD.  Doppler detection of pulses in the legs. This test is painless and checks to see if you have a pulses in your legs/feet.  A dye or contrast material (a substance that highlights the  blood vessels so they show up on x-ray) may be given to help your caregiver better see the arteries for the following tests. The dye is eliminated from your body by the kidney's. Your caregiver may order blood work to check your kidney function and other laboratory values before the following tests are performed:  Magnetic Resonance Angiography (MRA). An MRA is a picture study of the blood vessels and arteries. The MRA machine uses a large magnet to produce images of the blood vessels.  Computed Tomography Angiography (CTA). A CTA is a specialized x-ray that looks at how the blood flows in your blood vessels. An IV may be inserted into your arm so contrast dye can be injected.  Angiogram. Is a procedure that uses x-rays to look at your blood vessels. This procedure is minimally invasive, meaning a small incision (cut) is made in your groin. A small tube (catheter) is then inserted into the artery of your groin. The catheter is guided to the blood vessel or artery your caregiver wants to examine. Contrast dye is injected into the catheter.  X-rays are then taken of the blood vessel or artery. After the images are obtained, the catheter is taken out. TREATMENT  Treatment of PVD involves many interventions which may include:  Lifestyle changes:  Quitting smoking.  Exercise.  Following a low fat, low cholesterol diet.  Control of diabetes.  Foot care is very important to the PVD patient. Good foot care can help prevent infection.  Medication:  Cholesterol-lowering medicine.  Blood pressure medicine.  Anti-platelet drugs.  Certain medicines may reduce symptoms of Intermittent Claudication.  Interventional/Surgical options:  Angioplasty. An Angioplasty is a procedure that inflates a balloon in the blocked artery. This opens the blocked artery to improve blood flow.  Stent Implant. A wire mesh tube (stent) is placed in the artery. The stent expands and stays in place, allowing the artery  to remain open.  Peripheral Bypass Surgery. This is a surgical procedure that reroutes the blood around a blocked artery to help improve blood flow. This type of procedure may be performed if Angioplasty or stent implants are not an option. SEEK IMMEDIATE MEDICAL CARE IF:   You develop pain or numbness in your arms or legs.  Your arm or leg turns cold, becomes blue in color.  You develop redness, warmth, swelling and pain in your arms or legs. MAKE SURE YOU:   Understand these instructions.  Will watch your condition.  Will get help right away if you are not doing well or get worse. Document Released: 07/26/2004 Document Revised: 09/10/2011 Document Reviewed: 06/22/2008 [facility] Patient Information 2014 [place], [place].

## 2013-08-21 NOTE — Progress Notes (Signed)
[facility] [place] HISTORY AND PHYSICAL -PAD  History of Present Illness William Pacheco is a 63 y.o. male patient of Dr. Bridgett Larsson who is s/p R fem-pop BPG by Dr. Amedeo Plenty and L distal SFA stenting (08/30/11) who presents with chief complaint of routine follow up for surveillance. He ran out of his medications for 2 weeks since he could not afford. Was hospitalized at [facility] the end of January, 2015 for CHF, he also has atrial fib and and DM. States he has been taking his medications as prescribed since this hospitalization and his blood sugars are much better. He lost his job last May, 2014, which made his medications unaffordable. Left heel pain for about 6 weeks, tender to touch, he denies ulcers.  He has left calf claudication after walking about 40 feet, relieved with rest, no claudication symptoms elsewhere.  He denies history of stroke or TIA's. But states he had a 6 vessel CABG in 2008, was told he had an MI, he had no symptoms. His left radial artery was harvested for grafting.  The patient denies New Medical or Surgical History.  Pt Diabetic: Yes Pt smoker: former smoker, quit in 2008  Pt meds include: Statin :No Betablocker: Yes ASA: No Other anticoagulants/antiplatelets: taking Xaralto for atrial fib  Past Medical History  Diagnosis Date  . CAD (coronary artery disease), native coronary artery September 2008    Left main 50-60%, LAD 80% with D2 involved. RCA mid occlusion --> CABG  . 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 Ischemic cardiomyopathy September 2008    EF = 35-40%; THE 45-50% IN 2013 --> ECHO 03/03/13 - EF 40-45% LV: Moderate HK of basal-midlateral & inferior myocardium; c/w prior infarction in the distribution of the RCA or LCx. Mild-Mod MR with Calcified annulus. Mod TR  . History of MI (myocardial infarction) 2012    Noted by echocardiography, and nuclear stress testing. Known RCA occlusion prior to CABG   . PAF (paroxysmal atrial fibrillation) - initial presentation with RVR, and heart failure 2013    On a beta blocker, calcium channel blocker & Xarelto. Lexiscan Myoview 11/08/11 LV was enlarged with no evidence of ischemia. There was a moderate sized, moderate to severe in intensity fixed defect in the inferior wall suggestive of scar with no reversible ischemia. There was a basal septal akinesis w/severe global hypokinesis. EF = 31% (Which is different from the [facility] EF)  . Chronic diastolic CHF (congestive heart failure), NYHA class 2 2009, 2011; 2013    Exacerbated by A. fib RVR, currently on Lasix.  . Peripheral arterial disease 2009, 2011; 2013    2009, 2011; 2013  . Renal artery stenosis, non-flow-limiting   . DM (diabetes mellitus), type 2 with neurological complications     Peripheral neuropathy  . Diabetic peripheral neuropathy associated with type 2 diabetes mellitus   . Hyperlipidemia LDL goal <70     On statin therapy  . Obesity (BMI 30.0-34.9)   . Hypertension associated with diabetes   . Erectile dysfunction associated with type 2 diabetes mellitus  2006  . Ischemic mitral regurgitation  1980s    Mild to moderate  . Thrombocytopenia      chronic  . Osteoarthritis of both knees 1990    Status post left knee a arthroscopy, currently Harding right knee  . History of hepatitis B 1980s  . Adopted     Adopted [V68.89]    Social History  History  Substance Use Topics  . Smoking status: Former Smoker -- 0.50 packs/day    Types: Cigarettes    Start date: 07/02/1973    Quit date: 07/02/2005  . Smokeless tobacco: Never Used  . Alcohol Use: No    Family History Family History  Problem Relation Age of Onset  . Adopted: Yes  . Cancer Mother     Past Surgical History  Procedure Laterality Date  . Femoral endarterectomy  03-15-2008    Left EIA & CFA endarterectomy by Dr. Amedeo Plenty  . Knee arthroscopy Left 1990    For osteoarthritis pain.  . Tonsillectomy    . Vascular  surgery    . Pleural effusion drainage Right   . Coronary artery bypass graft  03/2007    CABG x 6 LIMA-LAD, left radial to diagonal 1, SVG to OM1-OM2 sequential, SVG to PDA-PLA sequential.    No Known Allergies  Current Outpatient Prescriptions  Medication Sig Dispense Refill  . aspirin 81 MG tablet Take 81 mg by mouth daily.      . carvedilol (COREG) 25 MG tablet Take 1.5 tablets (37.5 mg total) by mouth 2 (two) times daily with a meal. 1 and 1/2 tablets  90 tablet  0  . furosemide (LASIX) 40 MG tablet Take 1 tablet (40 mg total) by mouth daily.  30 tablet  0  . insulin glargine (LANTUS) 100 UNIT/ML injection Inject 35 Units into the skin at bedtime.      . potassium chloride (K-DUR) 10 MEQ tablet Take 1 tablet (10 mEq total) by mouth 2 (two) times daily.  60 tablet  0  . ramipril (ALTACE) 10 MG capsule Take 1 capsule (10 mg total) by mouth daily.  30 capsule  0  . Rivaroxaban (XARELTO) 20 MG TABS tablet Take 1 tablet (20 mg total) by mouth daily with supper.  30 tablet  0  . [DISCONTINUED] amLODipine (NORVASC) 10 MG tablet Take 10 mg by mouth daily.      . [DISCONTINUED] calcium carbonate (OS-CAL) 600 MG TABS Take 600 mg by mouth daily.      . [DISCONTINUED] pioglitazone-metformin (ACTOPLUS MET) 15-850 MG per tablet Take 1 tablet by mouth 2 (two) times daily with a meal.      . [DISCONTINUED] simvastatin (ZOCOR) 40 MG tablet Take 40 mg by mouth every evening.       No current facility-administered medications for this visit.    ROS: See HPI for pertinent positives and negatives.   Physical Examination  Filed Vitals:   08/21/13 1138  BP: 126/86  Pulse: 71  Resp: 16   Filed Weights   08/21/13 1138  Weight: 232 lb (105.235 kg)   Body mass index is 32.37 kg/(m^2).   General: A&O x 3, WDWN, obese male. Gait: normal Eyes: PERRLA. Pulmonary: CTAB, without wheezes , rales or rhonchi. Cardiac: irregular Rythm , without detected murmur.         Carotid Bruits Left Right    Negative Negative  Aorta is not palpable. Radial pulses: 1+ palpable right, absent left. 1+ palpable left ulnar pulse. (left radial artery used for CABG)                           VASCULAR EXAM: Extremities without ischemic changes  without Gangrene; without open wounds. Both feet are pale, no rubor, no cyanosis.  LE Pulses LEFT RIGHT       FEMORAL  not palpable   palpable        POPLITEAL  not palpable   not palpable       POSTERIOR TIBIAL  not palpable   faintly palpable        DORSALIS PEDIS      ANTERIOR TIBIAL  not palpable  not palpable    Abdomen: soft, NT, no masses. Skin: no rashes, no ulcers noted. Musculoskeletal: no muscle wasting or atrophy.  Neurologic: A&O X 3; Appropriate Affect ; SENSATION: normal; MOTOR FUNCTION:  moving all extremities equally, motor strength 5/5 throughout. Speech is fluent/normal. CN 2-12 intact.   Non-Invasive Vascular Imaging: DATE: 08/21/2013 ABI: RIGHT 0.75, Waveforms: bi and monophasic;  LEFT 0.40, Waveforms: monophasic Previous (04/24/2013) ABI's: Right : 0.87, Left: 0.65  DUPLEX SCAN OF BYPASS:  LOWER EXTREMITY ARTERIAL EVALUATION    INDICATION: Peripheral vascular disease    PREVIOUS INTERVENTION(S): Right femoral-popliteal bypass graft 08/2005; Left superficial femoral stent 01/24/2007 & 08/30/2011    DUPLEX EXAM: Duplex evaluation of the lower extremity arterial system to include the common femoral, superficial femoral, popliteal, and tibial arteries and bypass graft(s) and/or stent(s) if present.      FINDINGS:                                       RIGHT LOWER EXTREMITY:  Heterogeneous plaque present involving the common femoral artery with history of endarterectomy and graft placement. Diminshed monophasic waveform present at the common femoral artery which may be suggestive of a more proximal disease process.    LEFT LOWER  EXTREMITY:  Heterogeneous plaque present throughout the left lower extremity arterial system.  Diminished monophasic flow present at the level of the common femoral artery suggestive of a more proximal disease process.  Dampened diminished flow present throughout the left superficial femoral artery with occlusion at the mid/distal segment.      See the attached diagram for velocities.  IMPRESSION:  Dampened monophasic flow present involving the bilateral arterial inflow suggestive of more proximal disease process. Right common femoral stenosis present in the greater than 50% range. Right femoral to popliteal artery bypass graft is patent with elevated velocities present at the proximal anastomosis suggestive of 50%-70% stenosis. Left Mid to distal superficial femoral artery presents with absent flow suggesting vessel occlusion with history of stent placement. Decrease in the bilateral ankle and toe brachial indices since previous study on 04/24/2013.    ANKLE/BRACHIAL INDEX - Right = 0.75                     Left =   0.40         (Please see complete report)        ASSESSMENT: Carnelius G Simon is a 62 y.o. male who is s/p R fem-pop BPG by Dr. Hayes and L distal SFA stenting (08/30/11): who has worsening claudication in left calf, no open ulcers.  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 Dampened monophasic flow present involving the bilateral arterial inflow suggestive of more proximal disease process. Right common femoral stenosis present in the greater than 50% range. Right femoral to popliteal artery bypass graft is patent with elevated velocities present at the proximal anastomosis suggestive of 50%-70%   stenosis. Left Mid to distal superficial femoral artery presents with absent flow suggesting vessel occlusion with history of stent placement.  He  stopped taking all of his medications for a while after he lost his job and could not afford. This means his hyperglycemia worsened, his blood pressure likely remained elevated, both of which contributed to the progression of atherosclerosis. He states he is not taking any statin, he denies any adverse reaction to statins, denies any known liver problems. Will defer to his PCP to start a statin and monitor his lipids and LFT's. Consider intensive statin therapy which is associated with a greater reduction in CVD risk and improved endothelial function , will defer to PCP.    PLAN:  I discussed in depth with the patient the nature of atherosclerosis, and emphasized the importance of maximal medical management including strict control of blood pressure, blood glucose, and lipid levels, obtaining regular exercise, and continued cessation of smoking.  The patient is aware that without maximal medical management the underlying atherosclerotic disease process will progress, limiting the benefit of any interventions.  Based on the patient's vascular studies and examination, and after discussing with Dr. Bridgett Larsson,  pt will be scheduled for repeat angiogram with left leg run-off on Monday, August 31, 2013.  The patient was given information about PAD including signs, symptoms, treatment, what symptoms should prompt the patient to seek immediate medical care, and risk reduction measures to take.  Clemon Chambers, RN, MSN, FNP-C [facility] Phone: [phone]  Clinic MD: Bridgett Larsson  08/21/2013 11:44 AM

## 2013-08-21 NOTE — Telephone Encounter (Signed)
Left message to call back -regards to test results

## 2013-08-24 ENCOUNTER — Telehealth: Payer: Self-pay | Admitting: Cardiology

## 2013-08-24 NOTE — Telephone Encounter (Signed)
Spoke to patient. Result given . Verbalized understanding Patient states he is not having anginal or dyspnea at present time. Patient is aware he does not need cath at present time.

## 2013-08-24 NOTE — Telephone Encounter (Signed)
Message forwarded to Sharon, RN.  

## 2013-08-24 NOTE — Telephone Encounter (Signed)
Please call,would like his stress test results from about 2 weeks ago.

## 2013-08-26 ENCOUNTER — Other Ambulatory Visit: Payer: Self-pay

## 2013-08-26 ENCOUNTER — Telehealth: Payer: Self-pay | Admitting: *Deleted

## 2013-08-26 NOTE — Telephone Encounter (Signed)
Notified Dr. Concepcion Elk by telephone of [name] upcoming procedure (repeat angiogram with left leg runoff) on 09-03-2013 by Dr. [name].  Notified Dr. Concepcion Elk that [name] last dose of Xarelto prior to procedure will be on 08-30-2013.

## 2013-08-26 NOTE — Telephone Encounter (Signed)
Returning [name] earlier telephone call concerning wearing life vest (external wearable defibrillator) on 09-03-2013 for repeat angiogram with left leg runoff (Dr. Imogene Burn).  Spoke with Thereasa Distance in Cath Lab who instructed that William Pacheco wear his life vest on 09-03-2013.  (to be worn before and after the surgical procedure).  Notified William Pacheco of these instructions.  William Pacheco verbalized understanding of instructions regarding the life vest.

## 2013-08-31 NOTE — Telephone Encounter (Signed)
Results given to patient on 08/24/13

## 2013-09-01 ENCOUNTER — Encounter (HOSPITAL_COMMUNITY): Payer: Self-pay | Admitting: Pharmacist

## 2013-09-07 ENCOUNTER — Ambulatory Visit (HOSPITAL_COMMUNITY)
Admission: RE | Admit: 2013-09-07 | Discharge: 2013-09-07 | Disposition: A | Discharge status: Home / Self Care | Payer: No Typology Code available for payment source | Source: Ambulatory Visit | Attending: Vascular Surgery | Admitting: Vascular Surgery

## 2013-09-07 ENCOUNTER — Other Ambulatory Visit: Payer: Self-pay | Admitting: *Deleted

## 2013-09-07 ENCOUNTER — Encounter (HOSPITAL_COMMUNITY)
Admission: RE | Disposition: A | Discharge status: Home / Self Care | Payer: Self-pay | Source: Ambulatory Visit | Attending: Vascular Surgery

## 2013-09-07 DIAGNOSIS — I252 Old myocardial infarction: Secondary | ICD-10-CM | POA: Insufficient documentation

## 2013-09-07 DIAGNOSIS — Z7901 Long term (current) use of anticoagulants: Secondary | ICD-10-CM | POA: Insufficient documentation

## 2013-09-07 DIAGNOSIS — I739 Peripheral vascular disease, unspecified: Secondary | ICD-10-CM

## 2013-09-07 DIAGNOSIS — Z01810 Encounter for preprocedural cardiovascular examination: Secondary | ICD-10-CM

## 2013-09-07 DIAGNOSIS — I509 Heart failure, unspecified: Secondary | ICD-10-CM | POA: Insufficient documentation

## 2013-09-07 DIAGNOSIS — I70219 Atherosclerosis of native arteries of extremities with intermittent claudication, unspecified extremity: Secondary | ICD-10-CM

## 2013-09-07 DIAGNOSIS — Z9889 Other specified postprocedural states: Secondary | ICD-10-CM | POA: Insufficient documentation

## 2013-09-07 DIAGNOSIS — E669 Obesity, unspecified: Secondary | ICD-10-CM | POA: Insufficient documentation

## 2013-09-07 DIAGNOSIS — E1142 Type 2 diabetes mellitus with diabetic polyneuropathy: Secondary | ICD-10-CM | POA: Insufficient documentation

## 2013-09-07 DIAGNOSIS — I2589 Other forms of chronic ischemic heart disease: Secondary | ICD-10-CM | POA: Insufficient documentation

## 2013-09-07 DIAGNOSIS — I4891 Unspecified atrial fibrillation: Secondary | ICD-10-CM | POA: Insufficient documentation

## 2013-09-07 DIAGNOSIS — Z951 Presence of aortocoronary bypass graft: Secondary | ICD-10-CM | POA: Insufficient documentation

## 2013-09-07 DIAGNOSIS — E785 Hyperlipidemia, unspecified: Secondary | ICD-10-CM | POA: Insufficient documentation

## 2013-09-07 DIAGNOSIS — I2510 Atherosclerotic heart disease of native coronary artery without angina pectoris: Secondary | ICD-10-CM | POA: Insufficient documentation

## 2013-09-07 DIAGNOSIS — Z6830 Body mass index (BMI) 30.0-30.9, adult: Secondary | ICD-10-CM | POA: Insufficient documentation

## 2013-09-07 DIAGNOSIS — E1149 Type 2 diabetes mellitus with other diabetic neurological complication: Secondary | ICD-10-CM | POA: Insufficient documentation

## 2013-09-07 DIAGNOSIS — I10 Essential (primary) hypertension: Secondary | ICD-10-CM | POA: Insufficient documentation

## 2013-09-07 DIAGNOSIS — Z87891 Personal history of nicotine dependence: Secondary | ICD-10-CM | POA: Insufficient documentation

## 2013-09-07 HISTORY — PX: LOWER EXTREMITY ANGIOGRAM: SHX5508

## 2013-09-07 LAB — POCT I-STAT, CHEM 8
BUN: 12 mg/dL (ref 6–23)
CALCIUM ION: 1.12 mmol/L — AB (ref 1.13–1.30)
Chloride: 103 mEq/L (ref 96–112)
Creatinine, Ser: 0.8 mg/dL (ref 0.50–1.35)
GLUCOSE: 178 mg/dL — AB (ref 70–99)
HCT: 47 % (ref 39.0–52.0)
HEMOGLOBIN: 16 g/dL (ref 13.0–17.0)
Potassium: 4.3 mEq/L (ref 3.7–5.3)
Sodium: 140 mEq/L (ref 137–147)
TCO2: 26 mmol/L (ref 0–100)

## 2013-09-07 LAB — GLUCOSE, CAPILLARY: Glucose-Capillary: 140 mg/dL — ABNORMAL HIGH (ref 70–99)

## 2013-09-07 LAB — PROTIME-INR
INR: 0.97 (ref 0.00–1.49)
PROTHROMBIN TIME: 12.7 s (ref 11.6–15.2)

## 2013-09-07 SURGERY — ANGIOGRAM, LOWER EXTREMITY
Anesthesia: LOCAL | Laterality: Left

## 2013-09-07 MED ORDER — OXYCODONE-ACETAMINOPHEN 5-325 MG PO TABS: 1.0000 | ORAL_TABLET | ORAL | Status: DC | PRN

## 2013-09-07 MED ORDER — SODIUM CHLORIDE 0.9 % IV SOLN: INTRAVENOUS | Status: DC

## 2013-09-07 MED ORDER — HEPARIN (PORCINE) IN NACL 2-0.9 UNIT/ML-% IJ SOLN
INTRAMUSCULAR | Status: AC
  Filled 2013-09-07: qty 1000

## 2013-09-07 MED ORDER — FENTANYL CITRATE 0.05 MG/ML IJ SOLN
INTRAMUSCULAR | Status: AC
  Filled 2013-09-07: qty 2

## 2013-09-07 MED ORDER — MIDAZOLAM HCL 2 MG/2ML IJ SOLN
INTRAMUSCULAR | Status: AC
  Filled 2013-09-07: qty 2

## 2013-09-07 MED ORDER — SODIUM CHLORIDE 0.9 % IV SOLN
INTRAVENOUS | Status: DC
  Administered 2013-09-07: 12:00:00 via INTRAVENOUS

## 2013-09-07 MED ORDER — MORPHINE SULFATE 2 MG/ML IJ SOLN: 2.0000 mg | INTRAMUSCULAR | Status: DC | PRN

## 2013-09-07 MED ORDER — LIDOCAINE HCL (PF) 1 % IJ SOLN
INTRAMUSCULAR | Status: AC
  Filled 2013-09-07: qty 30

## 2013-09-07 NOTE — H&P (View-Only) (Signed)
[facility] [place] HISTORY AND PHYSICAL -PAD  History of Present Illness William Pacheco is a 63 y.o. male patient of Dr. Bridgett Larsson who is s/p R fem-pop BPG by Dr. Amedeo Plenty and L distal SFA stenting (08/30/11) who presents with chief complaint of routine follow up for surveillance. He ran out of his medications for 2 weeks since he could not afford. Was hospitalized at [facility] the end of January, 2015 for CHF, he also has atrial fib and and DM. States he has been taking his medications as prescribed since this hospitalization and his blood sugars are much better. He lost his job last May, 2014, which made his medications unaffordable. Left heel pain for about 6 weeks, tender to touch, he denies ulcers.  He has left calf claudication after walking about 40 feet, relieved with rest, no claudication symptoms elsewhere.  He denies history of stroke or TIA's. But states he had a 6 vessel CABG in 2008, was told he had an MI, he had no symptoms. His left radial artery was harvested for grafting.  The patient denies New Medical or Surgical History.  Pt Diabetic: Yes Pt smoker: former smoker, quit in 2008  Pt meds include: Statin :No Betablocker: Yes ASA: No Other anticoagulants/antiplatelets: taking Xaralto for atrial fib  Past Medical History  Diagnosis Date  . CAD (coronary artery disease), native coronary artery September 2008    Left main 50-60%, LAD 80% with D2 involved. RCA mid occlusion --> CABG  . 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 Ischemic cardiomyopathy September 2008    EF = 35-40%; THE 45-50% IN 2013 --> ECHO 03/03/13 - EF 40-45% LV: Moderate HK of basal-midlateral & inferior myocardium; c/w prior infarction in the distribution of the RCA or LCx. Mild-Mod MR with Calcified annulus. Mod TR  . History of MI (myocardial infarction) 2012    Noted by echocardiography, and nuclear stress testing. Known RCA occlusion prior to CABG   . PAF (paroxysmal atrial fibrillation) - initial presentation with RVR, and heart failure 2013    On a beta blocker, calcium channel blocker & Xarelto. Lexiscan Myoview 11/08/11 LV was enlarged with no evidence of ischemia. There was a moderate sized, moderate to severe in intensity fixed defect in the inferior wall suggestive of scar with no reversible ischemia. There was a basal septal akinesis w/severe global hypokinesis. EF = 31% (Which is different from the [facility] EF)  . Chronic diastolic CHF (congestive heart failure), NYHA class 2 2009, 2011; 2013    Exacerbated by A. fib RVR, currently on Lasix.  . Peripheral arterial disease 2009, 2011; 2013    2009, 2011; 2013  . Renal artery stenosis, non-flow-limiting   . DM (diabetes mellitus), type 2 with neurological complications     Peripheral neuropathy  . Diabetic peripheral neuropathy associated with type 2 diabetes mellitus   . Hyperlipidemia LDL goal <70     On statin therapy  . Obesity (BMI 30.0-34.9)   . Hypertension associated with diabetes   . Erectile dysfunction associated with type 2 diabetes mellitus  2006  . Ischemic mitral regurgitation  1980s    Mild to moderate  . Thrombocytopenia      chronic  . Osteoarthritis of both knees 1990    Status post left knee a arthroscopy, currently [place] right knee  . History of hepatitis B 1980s  . Adopted     Adopted [V68.89]    Social History  History  Substance Use Topics  . Smoking status: Former Smoker -- 0.50 packs/day    Types: Cigarettes    Start date: 07/02/1973    Quit date: 07/02/2005  . Smokeless tobacco: Never Used  . Alcohol Use: No    Family History Family History  Problem Relation Age of Onset  . Adopted: Yes  . Cancer Mother     Past Surgical History  Procedure Laterality Date  . Femoral endarterectomy  03-15-2008    Left EIA & CFA endarterectomy by Dr. Amedeo Plenty  . Knee arthroscopy Left 1990    For osteoarthritis pain.  . Tonsillectomy    . Vascular  surgery    . Pleural effusion drainage Right   . Coronary artery bypass graft  03/2007    CABG x 6 LIMA-LAD, left radial to diagonal 1, SVG to OM1-OM2 sequential, SVG to PDA-PLA sequential.    No Known Allergies  Current Outpatient Prescriptions  Medication Sig Dispense Refill  . aspirin 81 MG tablet Take 81 mg by mouth daily.      . carvedilol (COREG) 25 MG tablet Take 1.5 tablets (37.5 mg total) by mouth 2 (two) times daily with a meal. 1 and 1/2 tablets  90 tablet  0  . furosemide (LASIX) 40 MG tablet Take 1 tablet (40 mg total) by mouth daily.  30 tablet  0  . insulin glargine (LANTUS) 100 UNIT/ML injection Inject 35 Units into the skin at bedtime.      . potassium chloride (K-DUR) 10 MEQ tablet Take 1 tablet (10 mEq total) by mouth 2 (two) times daily.  60 tablet  0  . ramipril (ALTACE) 10 MG capsule Take 1 capsule (10 mg total) by mouth daily.  30 capsule  0  . Rivaroxaban (XARELTO) 20 MG TABS tablet Take 1 tablet (20 mg total) by mouth daily with supper.  30 tablet  0  . [DISCONTINUED] amLODipine (NORVASC) 10 MG tablet Take 10 mg by mouth daily.      . [DISCONTINUED] calcium carbonate (OS-CAL) 600 MG TABS Take 600 mg by mouth daily.      . [DISCONTINUED] pioglitazone-metformin (ACTOPLUS MET) 15-850 MG per tablet Take 1 tablet by mouth 2 (two) times daily with a meal.      . [DISCONTINUED] simvastatin (ZOCOR) 40 MG tablet Take 40 mg by mouth every evening.       No current facility-administered medications for this visit.    ROS: See HPI for pertinent positives and negatives.   Physical Examination  Filed Vitals:   08/21/13 1138  BP: 126/86  Pulse: 71  Resp: 16   Filed Weights   08/21/13 1138  Weight: 232 lb (105.235 kg)   Body mass index is 32.37 kg/(m^2).   General: A&O x 3, WDWN, obese male. Gait: normal Eyes: PERRLA. Pulmonary: CTAB, without wheezes , rales or rhonchi. Cardiac: irregular Rythm , without detected murmur.         Carotid Bruits Left Right    Negative Negative  Aorta is not palpable. Radial pulses: 1+ palpable right, absent left. 1+ palpable left ulnar pulse. (left radial artery used for CABG)                           VASCULAR EXAM: Extremities without ischemic changes  without Gangrene; without open wounds. Both feet are pale, no rubor, no cyanosis.  LE Pulses LEFT RIGHT       FEMORAL  not palpable   palpable        POPLITEAL  not palpable   not palpable       POSTERIOR TIBIAL  not palpable   faintly palpable        DORSALIS PEDIS      ANTERIOR TIBIAL  not palpable  not palpable    Abdomen: soft, NT, no masses. Skin: no rashes, no ulcers noted. Musculoskeletal: no muscle wasting or atrophy.  Neurologic: A&O X 3; Appropriate Affect ; SENSATION: normal; MOTOR FUNCTION:  moving all extremities equally, motor strength 5/5 throughout. Speech is fluent/normal. CN 2-12 intact.   Non-Invasive Vascular Imaging: DATE: 08/21/2013 ABI: RIGHT 0.75, Waveforms: bi and monophasic;  LEFT 0.40, Waveforms: monophasic Previous (04/24/2013) ABI's: Right : 0.87, Left: 0.65  DUPLEX SCAN OF BYPASS:  LOWER EXTREMITY ARTERIAL EVALUATION    INDICATION: Peripheral vascular disease    PREVIOUS INTERVENTION(S): Right femoral-popliteal bypass graft 08/2005; Left superficial femoral stent 01/24/2007 & 08/30/2011    DUPLEX EXAM: Duplex evaluation of the lower extremity arterial system to include the common femoral, superficial femoral, popliteal, and tibial arteries and bypass graft(s) and/or stent(s) if present.      FINDINGS:                                       RIGHT LOWER EXTREMITY:  Heterogeneous plaque present involving the common femoral artery with history of endarterectomy and graft placement. Diminshed monophasic waveform present at the common femoral artery which may be suggestive of a more proximal disease process.    LEFT LOWER  EXTREMITY:  Heterogeneous plaque present throughout the left lower extremity arterial system.  Diminished monophasic flow present at the level of the common femoral artery suggestive of a more proximal disease process.  Dampened diminished flow present throughout the left superficial femoral artery with occlusion at the mid/distal segment.      See the attached diagram for velocities.  IMPRESSION:  Dampened monophasic flow present involving the bilateral arterial inflow suggestive of more proximal disease process. Right common femoral stenosis present in the greater than 50% range. Right femoral to popliteal artery bypass graft is patent with elevated velocities present at the proximal anastomosis suggestive of 50%-70% stenosis. Left Mid to distal superficial femoral artery presents with absent flow suggesting vessel occlusion with history of stent placement. Decrease in the bilateral ankle and toe brachial indices since previous study on 04/24/2013.    ANKLE/BRACHIAL INDEX - Right = 0.75                     Left =   0.40         (Please see complete report)        ASSESSMENT: Rhea G Gu is a 62 y.o. male who is s/p R fem-pop BPG by Dr. Hayes and L distal SFA stenting (08/30/11): who has worsening claudication in left calf, no open ulcers.  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 Dampened monophasic flow present involving the bilateral arterial inflow suggestive of more proximal disease process. Right common femoral stenosis present in the greater than 50% range. Right femoral to popliteal artery bypass graft is patent with elevated velocities present at the proximal anastomosis suggestive of 50%-70%   stenosis. Left Mid to distal superficial femoral artery presents with absent flow suggesting vessel occlusion with history of stent placement.  He  stopped taking all of his medications for a while after he lost his job and could not afford. This means his hyperglycemia worsened, his blood pressure likely remained elevated, both of which contributed to the progression of atherosclerosis. He states he is not taking any statin, he denies any adverse reaction to statins, denies any known liver problems. Will defer to his PCP to start a statin and monitor his lipids and LFT's. Consider intensive statin therapy which is associated with a greater reduction in CVD risk and improved endothelial function , will defer to PCP.    PLAN:  I discussed in depth with the patient the nature of atherosclerosis, and emphasized the importance of maximal medical management including strict control of blood pressure, blood glucose, and lipid levels, obtaining regular exercise, and continued cessation of smoking.  The patient is aware that without maximal medical management the underlying atherosclerotic disease process will progress, limiting the benefit of any interventions.  Based on the patient's vascular studies and examination, and after discussing with Dr. Bridgett Larsson,  pt will be scheduled for repeat angiogram with left leg run-off on Monday, August 31, 2013.  The patient was given information about PAD including signs, symptoms, treatment, what symptoms should prompt the patient to seek immediate medical care, and risk reduction measures to take.  Clemon Chambers, RN, MSN, FNP-C [facility] Phone: [phone]  Clinic MD: Bridgett Larsson  08/21/2013 11:44 AM

## 2013-09-07 NOTE — Interval H&P Note (Signed)
[facility]  History and Physical Update  The patient was interviewed and re-examined.  The patient's previous History and Physical has been reviewed and is unchanged from my NP's.  There is no change in the plan of care: Aortogram, bilateral leg runoff, and possible left leg intervention.  [name], MD [facility] Office: [phone] Pager: [phone]  09/07/2013, 11:32 AM

## 2013-09-07 NOTE — Op Note (Signed)
OPERATIVE NOTE   PROCEDURE: 1.  Left common femoral artery cannulation under ultrasound guidance 2.  Placement of catheter in aorta 3.  Aortogram 4.  Second order arterial selection 5.  Right leg runoff 6.  Left leg runoff  PRE-OPERATIVE DIAGNOSIS: Left leg short distance claudication  POST-OPERATIVE DIAGNOSIS: same as above   SURGEON: William Sake, MD  ANESTHESIA: conscious sedation  ESTIMATED BLOOD LOSS: 30 cc  CONTRAST: 107 cc  FINDING(S):  Aorta: Patent  Right Left  CIA Patent with 30% stenosis in proximal segment Patent  EIA Patent Patent  IIA Patent Patent  CFA Patent with 50% proximal stenosis Patent with 30% proximal stenosis  SFA Occluded, patent fem-pop graft Patent proximally, diffusely diseased, 2 proximal stents patent with obvious in-stent disease, distal stent not fully visualized but presumed partially patent  PFA Patent with 90% stenosis on branch Patent  Pop Patent Patent but heavily diseased with 50-75% stenosis throughout most of artery, small below-the-knee segment  Trif Difficult to delineate Occluded  AT Occluded Only runoff from below-the-knee popliteal artery  Pero Only runoff to right foot Occluded  PT Occluded Occluded   SPECIMEN(S):  none  INDICATIONS:   William Pacheco is a 63 y.o. male who presents with left leg short distance claudication.  The patient presents for: aortogram bilateral leg runoff, and possible intervention.  I discussed with the patient the nature of angiographic procedures, especially the limited patencies of any endovascular intervention.  The patient is aware of that the risks of an angiographic procedure include but are not limited to: bleeding, infection, access site complications, renal failure, embolization, rupture of vessel, dissection, possible need for emergent surgical intervention, possible need for surgical procedures to treat the patient's pathology, and stroke and death.  The patient is aware of the risks and  agrees to proceed.  DESCRIPTION: After full informed consent was obtained from the patient, the patient was brought back to the angiography suite.  The patient was placed supine upon the angiography table and connected to monitoring equipment.  The patient was then given conscious sedation, the amounts of which are documented in the patient's chart.  The patient was prepped and drape in the standard fashion for an angiographic procedure.  At this point, attention was turned to the right groin.  I could not identify a patent common femoral artery due to shadowing due to scar tissue.  I turned my attention to the left groin.  Under ultrasound guidance, the Dacron patched left common femoral artery was cannulated with a 18 gauge needle.  The [facility] wire was passed up into the aorta.  The needle was exchanged for a 5-Fr dilator and a 6-Fr dilator with great difficulty due to scar tissue.  The dilator was removed and I tried to pass a 4-Fr endhole catheter, but it caught in the scar tissue.  I replaced the 6-Fr dilator and exchanged the wire for an Amplatz wire which was passed into the aorta.  The dilator was removed and a 5-Fr sheath was advanced over the wire into the common femoral artery with great difficulty.  The dilator was then removed.  The Omniflush catheter was then loaded over the wire up to the level of L1.  The catheter was connected to the power injector circuit.  After de-airring and de-clotting the circuit, a power injector aortogram was completed.  The [facility] wire was replaced in the catheter, and using the [place] and Omniflush catheter, the right common iliac artery was selected.  The  wire was advanced into the external iliac artery.  The catheter would not track, so I exchanged it for an end-hole catheter, which I placed into the right external iliac artery.  The wire was removed and then the catheter connected to the power injector circuit.  An automated right leg runoff was completed.  I then  removed the catheter.  The sheath was aspirated.  No clots were present and the sheath was reloaded with heparinized saline.   The left femoral sheath was connected to the power injector.  An automated left leg runoff was completed.  The findings for both legs are listed above.  The sheath was aspirated.  No clots were present and the sheath was reloaded with heparinized saline.    Based on the images, I do not recommend endovascular recannulation.  The patient has had >2 weeks period of symptoms, suggesting stent occlusion for >2 weeks.  Any thrombus present in the stent at this point can easily embolize and occlude distally without any chance of response to thrombolytics.  After this patient's cardiac work-up is completed, the patient will return for femoropopliteal bypass discussions.  COMPLICATIONS: none  CONDITION: stable  William SakeBrian Chen, MD [facility] [place] Office: [phone] Pager: [phone]  09/07/2013, 2:43 PM

## 2013-09-07 NOTE — Discharge Instructions (Signed)
Angiography, Care After °Refer to this sheet in the next few weeks. These instructions provide you with information on caring for yourself after your procedure. Your health care provider may also give you more specific instructions. Your treatment has been planned according to current medical practices, but problems sometimes occur. Call your health care provider if you have any problems or questions after your procedure.  °WHAT TO EXPECT AFTER THE PROCEDURE °After your procedure, it is typical to have the following sensations: °· Minor discomfort or tenderness and a small bump at the catheter insertion site. The bump should usually decrease in size and tenderness within 1 to 2 weeks. °· Any bruising will usually fade within 2 to 4 weeks. °HOME CARE INSTRUCTIONS  °· You may need to keep taking blood thinners if they were prescribed for you. Only take over-the-counter or prescription medicines for pain, fever, or discomfort as directed by your health care provider. °· Do not apply powder or lotion to the site. °· Do not sit in a bathtub, swimming pool, or whirlpool for 5 to 7 days. °· You may shower 24 hours after the procedure. Remove the bandage (dressing) and gently wash the site with plain soap and water. Gently pat the site dry. °· Inspect the site at least twice daily. °· Limit your activity for the first 48 hours. Do not bend, squat, or lift anything over 20 lb (9 kg) or as directed by your health care provider. °· Do not drive home if you are discharged the day of the procedure. Have someone else drive you. Follow instructions about when you can drive or return to work. °SEEK MEDICAL CARE IF: °· You get lightheaded when standing up. °· You have drainage (other than a small amount of blood on the dressing). °· You have chills. °· You have a fever. °· You have redness, warmth, swelling, or pain at the insertion site. °SEEK IMMEDIATE MEDICAL CARE IF:  °· You develop chest pain or shortness of breath, feel faint,  or pass out. °· You have bleeding, swelling larger than a walnut, or drainage from the catheter insertion site. °· You develop pain, discoloration, coldness, or severe bruising in the leg or arm that held the catheter. °· You develop bleeding from any other place, such as the bowels. You may see bright red blood in your urine or stools, or your stools may appear black and tarry. °· You have heavy bleeding from the site. If this happens, hold pressure on the site. °MAKE SURE YOU: °· Understand these instructions. °· Will watch your condition. °· Will get help right away if you are not doing well or get worse. °Document Released: 01/04/2005 Document Revised: 02/18/2013 Document Reviewed: 11/10/2012 °Exit[employer]® Patient Information ©2014 [employer]. ° °

## 2013-09-08 ENCOUNTER — Telehealth: Payer: Self-pay | Admitting: Vascular Surgery

## 2013-09-08 ENCOUNTER — Telehealth: Payer: Self-pay | Admitting: Cardiology

## 2013-09-08 DIAGNOSIS — I517 Cardiomegaly: Secondary | ICD-10-CM

## 2013-09-08 NOTE — Telephone Encounter (Signed)
Wanting to know if Dr.Harding has responded to Dr.Chen Message on yesterday .Marland Kitchen Wants to know what is the next step he should be taking . Please call    Thanks

## 2013-09-08 NOTE — Telephone Encounter (Signed)
Returned call and pt verified x 2.  Pt informed message received and RN does not see a note to Dr. Herbie Baltimore from Dr. Imogene Burn.  Informed he could have sent a private message, which RN cannot see.  Pt stated he needs to know what the plan is b/c Dr. Imogene Burn tried to go in to clean up some things in his legs, but couldn't.  Stated he mentioned something about a fraction (ejection fraction) that needed to be checked and cleared for him to have surgery.  Pt stated he can hardly walk now and needs to apply for disability or something b/c he has no income and cannot work.    RN reviewed stress test results w/ pt and informed Dr. Herbie Baltimore mentioned he needed to repeat echo as scheduled or to be seen sooner if symptoms worse.  Pt advised he will need an appt for evaluation.  Dr. Elissa Hefty next available appt is in April and appt offered w/ Nada Boozer, NP on 3.12.15 at 9:40 am for evaluation since Dr. Herbie Baltimore is in the office that day.  Pt informed Dr. Herbie Baltimore will be notified for further instructions regarding echo as RN needs authorization from MD to order and note did not specify when to repeat.  Pt verbalized understanding and agreed w/ plan.  Appt was scheduled for 3.12.15 w/ NP.  Message forwarded to Dr. Donneta Romberg as this RN is out of the office tomorrow.

## 2013-09-08 NOTE — Telephone Encounter (Signed)
I would like to have a f/u Echo to know his EF while on meds.   Provided the EF is stable to improved, would probably proceed with surgery.   I have not seen him since before his recent hospital stay.  He saw one of the  PAs (I think [place]). He was to be seeing me soon for f/u.  I can see him on Thursday if needed. We should get an echo though.  --  I did get the note from Dr. Imogene Burn.  Marykay Lex, MD

## 2013-09-08 NOTE — Telephone Encounter (Addendum)
Message copied by Fredrich Birks on Tue Sep 08, 2013  1:45 PM ------      Message from: Lorin Mercy K      Created: Mon Sep 07, 2013  3:20 PM      Regarding: Schedule                   ----- Message -----         From: Fransisco Hertz, MD         Sent: 09/07/2013   2:59 PM           To: [address]            MADEX Pacheco      [id number]      1950/07/26                  PROCEDURE:      1.  Left common femoral artery cannulation under ultrasound guidance      2.  Placement of catheter in aorta      3.  Aortogram      4.  Second order arterial selection      5.  Right leg runoff      6.  Left leg runoff            Follow-up: 2-3 weeks            Orders(s) for follow-up: BLE vein mapping       ------  09/08/13: spoke with pt. He also mentioned that he is trying to get cardiac clearance this week. He asked that I let the clinical staff know to be looking for those results. His echo is scheduled for 03/11 and he is seeing Dr Herbie Baltimore 03/12. I will route this to carol and kay as FYI

## 2013-09-08 NOTE — Telephone Encounter (Signed)
Echo ordered.  Spoke w/ Ebony in Ancillary Scheduling and echo scheduled for tomorrow at 12 pm.  Call to pt and pt informed.  Pt agreeable w/ appt and will keep appt on Thursday for f/u with Laura/Dr. Herbie Baltimore at 9:40am.

## 2013-09-09 ENCOUNTER — Ambulatory Visit (HOSPITAL_COMMUNITY)
Admission: RE | Admit: 2013-09-09 | Discharge: 2013-09-09 | Disposition: A | Discharge status: Home / Self Care | Payer: No Typology Code available for payment source | Source: Ambulatory Visit | Attending: Cardiovascular Disease | Admitting: Cardiovascular Disease

## 2013-09-09 DIAGNOSIS — I10 Essential (primary) hypertension: Secondary | ICD-10-CM | POA: Diagnosis not present

## 2013-09-09 DIAGNOSIS — I059 Rheumatic mitral valve disease, unspecified: Secondary | ICD-10-CM

## 2013-09-09 DIAGNOSIS — I2589 Other forms of chronic ischemic heart disease: Secondary | ICD-10-CM | POA: Insufficient documentation

## 2013-09-09 DIAGNOSIS — I517 Cardiomegaly: Secondary | ICD-10-CM | POA: Insufficient documentation

## 2013-09-09 DIAGNOSIS — I255 Ischemic cardiomyopathy: Secondary | ICD-10-CM

## 2013-09-09 HISTORY — PX: TRANSTHORACIC ECHOCARDIOGRAM: SHX275

## 2013-09-09 NOTE — Progress Notes (Signed)
2D Echo Performed 09/09/2013    Candita Borenstein, RCS  

## 2013-09-10 ENCOUNTER — Ambulatory Visit (INDEPENDENT_AMBULATORY_CARE_PROVIDER_SITE_OTHER): Payer: No Typology Code available for payment source | Admitting: Cardiology

## 2013-09-10 ENCOUNTER — Encounter: Payer: Self-pay | Admitting: Cardiology

## 2013-09-10 VITALS — BP 100/60 | HR 68 | Ht 71.0 in | Wt 236.0 lb

## 2013-09-10 DIAGNOSIS — I2510 Atherosclerotic heart disease of native coronary artery without angina pectoris: Secondary | ICD-10-CM

## 2013-09-10 DIAGNOSIS — I480 Paroxysmal atrial fibrillation: Secondary | ICD-10-CM

## 2013-09-10 DIAGNOSIS — I739 Peripheral vascular disease, unspecified: Secondary | ICD-10-CM

## 2013-09-10 DIAGNOSIS — I4891 Unspecified atrial fibrillation: Secondary | ICD-10-CM

## 2013-09-10 DIAGNOSIS — Z7901 Long term (current) use of anticoagulants: Secondary | ICD-10-CM

## 2013-09-10 DIAGNOSIS — I2589 Other forms of chronic ischemic heart disease: Secondary | ICD-10-CM

## 2013-09-10 DIAGNOSIS — I255 Ischemic cardiomyopathy: Secondary | ICD-10-CM

## 2013-09-10 NOTE — Assessment & Plan Note (Signed)
Recent nuc study in 08/2013 with continued scar but no ischemia and no significant change in nuc.

## 2013-09-10 NOTE — Assessment & Plan Note (Signed)
Recent PV angiogram by Dr. Melvyn Novas was done and pt needs fem pop bypass for occlusion of his stent and significant claudication hindering exercise..  Dr. Herbie Baltimore saw the pt as well as myself and felt he is high risk for surgery.

## 2013-09-10 NOTE — Assessment & Plan Note (Signed)
For PAF.  Currently in SR

## 2013-09-10 NOTE — Patient Instructions (Signed)
Stop wearing your lifevest, no longer needed.  Ok for surgery.  Heart Healthy diet.  Follow up with APP in 2 weeks post surgery on a day Dr. Herbie Baltimore is in the office.

## 2013-09-10 NOTE — Assessment & Plan Note (Addendum)
With recent hospitalization EF decreased to 15%-20% and pt wore lifevest, but on recent echo EF now 40%, so lifevest can be discontinued.

## 2013-09-10 NOTE — Assessment & Plan Note (Signed)
Currently maintaining SR

## 2013-09-10 NOTE — Progress Notes (Signed)
09/10/2013   PCP: Philis Fendt, MD   Chief Complaint  Patient presents with  . Follow-up    follow up from echo on 09/10/13    Primary Cardiologist:Dr. Ellyn Hack  HPI: 63 y.o. male followed by Dr Ellyn Hack with a past medical history significant for CAD. He had CABG x 6 in Sept 2008 with a LIMA-LAD, LRA-Dx1, SVG-MO1/OM2, SVG-PD/PL. He had a Myoview in May 2013 that showed LVD with an EF of 31% but no ischemia. EF by echocardiogram, during that time, revealed an EF of 40-45%. He had AF in 2013 and has been on Eliquis. He has PVD and has had multiple procedures on his lower extremities. Most recent in Feb and has stent stenosis of Lt SFA causing lifestyle limiting claudication.  Dr. Harrie Foreman recommends fem-pop bypass.  Additionally pt was hospitalized in Jan for resp failure for acute systolic HF and elevated troponin.  (he had run out of meds after period of no insurance) . He was diuresed but echo at that time revealed EF 15-20% a significant drop.  Meds were adjusted.  On follow up visit life vest and follow up echo and nuc study were performed. EF improved to 40 % and life vest can be discontinued.   He continues with mild to mod. MR.  Nuc study was Essentially unchanged result from 2013 - large area of infarction without suggestion of ischemia. On a good note - EF was more like 30-35% as opposed to the echo report of 15-20%. The nuc was done approx a month before echo.  Pt back today for follow up and surgical clearance.  No chest pain, no SOB.  No lightheaded no dizziness.  Continues with significant claudication of lt leg, hindering his ability to exercise.  His BP is soft, unable to further titrate meds.  I spent > 30 min discussing tests and plan with Pt and daughter and then Dr. Ellyn Hack spent > 30 min of time with similar discussion.       No Known Allergies  Current Outpatient Prescriptions  Medication Sig Dispense Refill  . aspirin 81 MG tablet Take 81 mg by mouth  daily.      Marland Kitchen atorvastatin (LIPITOR) 20 MG tablet Take 20 mg by mouth daily at 6 PM.      . Calcium Carb-Cholecalciferol (CALCIUM 1000 + D PO) Take 2 tablets by mouth daily.      . carvedilol (COREG) 25 MG tablet Take 37.5 mg by mouth 2 (two) times daily with a meal. (1 and 1/2 tablets)      . Cyanocobalamin (VITAMIN B-12) 2000 MCG TBCR Take 3 tablets by mouth daily.      . furosemide (LASIX) 40 MG tablet Take 1 tablet (40 mg total) by mouth daily.  30 tablet  0  . insulin glargine (LANTUS) 100 UNIT/ML injection Inject 35 Units into the skin at bedtime.      . Magnesium Oxide 500 MG CAPS Take 1,000 mg by mouth.      . omega-3 acid ethyl esters (LOVAZA) 1 G capsule Take 1 g by mouth daily.      . potassium chloride (K-DUR) 10 MEQ tablet Take 1 tablet (10 mEq total) by mouth 2 (two) times daily.  60 tablet  0  . ramipril (ALTACE) 10 MG capsule Take 1 capsule (10 mg total) by mouth daily.  30 capsule  0  . Rivaroxaban (XARELTO) 20 MG TABS tablet Take 1 tablet (20 mg total)  by mouth daily with supper.  30 tablet  0  . vitamin C (ASCORBIC ACID) 500 MG tablet Take 1,500 mg by mouth daily.      . Vitamin D, Cholecalciferol, 1000 UNITS TABS Take 3,000 Units by mouth daily.      . vitamin E 400 UNIT capsule Take 1,200 Units by mouth daily.      . [DISCONTINUED] amLODipine (NORVASC) 10 MG tablet Take 10 mg by mouth daily.      . [DISCONTINUED] calcium carbonate (OS-CAL) 600 MG TABS Take 600 mg by mouth daily.      . [DISCONTINUED] pioglitazone-metformin (ACTOPLUS MET) 15-850 MG per tablet Take 1 tablet by mouth 2 (two) times daily with a meal.      . [DISCONTINUED] simvastatin (ZOCOR) 40 MG tablet Take 40 mg by mouth every evening.       No current facility-administered medications for this visit.    Past Medical History  Diagnosis Date  . CAD (coronary artery disease), native coronary artery September 2008    Left main 50-60%, LAD 80% with D2 involved. RCA mid occlusion --> CABG  . 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 Ischemic cardiomyopathy September 2008    EF = 35-40%; THE 45-50% IN 2013 --> ECHO 03/03/13 - EF 40-45% LV: Moderate HK of basal-midlateral & inferior myocardium; c/w prior infarction in the distribution of the RCA or LCx. Mild-Mod MR with Calcified annulus. Mod TR  . History of MI (myocardial infarction) 2012    Noted by echocardiography, and nuclear stress testing. Known RCA occlusion prior to CABG  . PAF (paroxysmal atrial fibrillation) - initial presentation with RVR, and heart failure 2013    On a beta blocker, calcium channel blocker & Xarelto. Lexiscan Myoview 11/08/11 LV was enlarged with no evidence of ischemia. There was a moderate sized, moderate to severe in intensity fixed defect in the inferior wall suggestive of scar with no reversible ischemia. There was a basal septal akinesis w/severe global hypokinesis. EF = 31% (Which is different from the [facility] EF)  . Chronic diastolic CHF (congestive heart failure), NYHA class 2 2009, 2011; 2013    Exacerbated by A. fib RVR, currently on Lasix.  . Peripheral arterial disease 2009, 2011; 2013    2009, 2011; 2013  . Renal artery stenosis, non-flow-limiting   . DM (diabetes mellitus), type 2 with neurological complications     Peripheral neuropathy  . Diabetic peripheral neuropathy associated with type 2 diabetes mellitus   . Hyperlipidemia LDL goal <70     On statin therapy  . Obesity (BMI 30.0-34.9)   . Hypertension associated with diabetes   . Erectile dysfunction associated with type 2 diabetes mellitus  2006  . Ischemic mitral regurgitation  1980s    Mild to moderate  . Thrombocytopenia      chronic  . Osteoarthritis of both knees 1990    Status post left knee a arthroscopy, currently Hard[place] right knee  . History of hepatitis B 1980s  . Adopted     Adopted [J49.70]    Past Surgical History  Procedure Laterality Date  . Femoral endarterectomy  03-15-2008     Left EIA & CFA endarterectomy by Dr. Amedeo Plenty  . Knee arthroscopy Left 1990    For osteoarthritis pain.  . Tonsillectomy    . Vascular surgery    . Pleural effusion drainage Right   . Coronary artery bypass graft  03/2007  CABG x 6 LIMA-LAD, left radial to diagonal 1, SVG to OM1-OM2 sequential, SVG to PDA-PLA sequential.  . Transthoracic echocardiogram  November 2014    EF 40-45%. Moderate hypokinesis of the basal and mid lateral and inferior myocardium. Mild to moderate MR. Mild LA dilation.  . Transthoracic echocardiogram  07/26/2013    In setting of acute illness: EF 15-20%, atrial fibrillation; diffuse hypokinesis with inferolateral abnormalities consistent with ischemic heart disease; grade 3 diastolic dysfunction/restrictive physiology - elevated LVEDP/LAP..  . Transthoracic echocardiogram  09/09/2013    40%. Septal hypokinesis. Moderate to severely dilated LV. Mild LVH and diffuse hypokinesis. Mild to moderate MR.  Marland Kitchen. Nm myoview ltd  08/13/2048    Moderate LV dilation. Abnormal study with all high-risk features, unchanged from October 2014. Inferior defect likely representing nontransmural scar. Suggesting nonischemic cardiopathy superimposed on known ischemic cardiomyopathy.; Severe global hypokinesis with EF of roughly 31%.    JYN:WGNFAOZ:HYROS:General:no colds or fevers, no weight changes Skin:no rashes or ulcers HEENT:no blurred vision, no congestion CV:see HPI PUL:see HPI GI:no diarrhea constipation or melena, no indigestion GU:no hematuria, no dysuria MS:no joint pain, ++ Lt leg claudication Neuro:no syncope, no lightheadedness Endo:+ diabetes insulin dependent , no thyroid disease  PHYSICAL EXAM BP 100/60  Pulse 68  Ht 5\' 11"  (1.803 m)  Wt 236 lb (107.049 kg)  BMI 32.93 kg/m2 General:Pleasant affect, NAD Skin:Warm and dry, brisk capillary refill HEENT:normocephalic, sclera clear, mucus membranes moist Neck:supple, no JVD, no bruits, no adenopathy  Heart:S1S2 RRR without murmur,  gallup, rub or click Lungs:clear without rales, rhonchi, or wheezes QMV:HQIOAbd:soft, non tender, + BS, do not palpate liver spleen or masses Ext:no lower ext edema,  2+ radial pulses Neuro:alert and oriented, MAE, follows commands, + facial symmetry  EKG:SR with PACs no acute changes  ASSESSMENT AND PLAN Ischemic cardiomyopathy With recent hospitalization EF decreased to 15%-20% and pt wore lifevest, but on recent echo EF now 40%, so lifevest can be discontinued.   CAD- CABG x 6 9/08- Myoview 5/13 with scar, no ischemia Recent nuc study in 08/2013 with continued scar but no ischemia and no significant change in nuc.  Anticoagulation adequate, on Xarelto For PAF.  Currently in SR  PAF (paroxysmal atrial fibrillation) Currently maintaining SR  PAD - R Fem-Pop, L SFA stent Recent PV angiogram by Dr. Melvyn Novashenn was done and pt needs fem pop bypass for occlusion of his stent and significant claudication hindering exercise..  Dr. Herbie BaltimoreHarding saw the pt as well as myself and felt he is high risk for surgery.    >1 hour spent with MD and NP time.   ATTENDING PHYSICIAN ATTESTATION:  I have seen and evaluated the patient this AM along with Nada BoozerLaura Ingold, NP. I agree with her findings, examination as well as impression recommendations.  Renae Fickleaul is a patient is well known to me. I had last seen him back in September he was doing relatively well at that time. Unfortunately due to financial constraints, he seems to have had a lapse in medical adherence this past January which resulted in admission with acute on chronic systolic and diastolic heart failure. His systolic function was dramatically worsened according to the echocardiogram is read. He was also presumably in nature fibrillation which I did not see an echocardiogram. I personally reviewed the echo and did not feel that the EF was decreased in extent interpreted. I felt was more of a 25%.  After initial therapy and diuresis with restarting his medications, he  has been doing much better from  an overall cardiac standpoint. His edema has improved his PND and orthopnea gone. His dyspnea is gone. He is not having any anginal symptoms.  He saw Boyce Medici, PA about a month ago for post hospital followup. She was concerned with his dramatic reduced ejection fraction, as I was bit no ischemic evaluation was done. Apparently in the hospital he decided not to do heart catheterization. A nuclear stress test was ordered and performed and was found to be negative for ischemia just consistent with his previous nuclear study with a large inferior infarct. This also is consistent with echocardiogram findings that we ordered to reassess his EF which is now notably improved.  The major issue today is that the patient is having significant claudication and is requiring a femoropopliteal bypass. I was contacted by Dr. [name] of Vascular Surgery for preoperative assessment. Unfortunately I did not have a clinic appointment available to see him in short order, therefore he was seen Nada Boozer, NP. I did request that the echocardiogram be done prior to this visit which has been quite helpful.  Plan:  With improvement EF, he can turn in the LifeVest.  Proceed with vascular surgery without any further cardiac evaluation. He is on beta blocker for mild reduction in risk.  I spent close to 40 minutes in direct consultation beyond Ms. Ingold's evaluation and consultation discussing the findings of the echocardiogram and Myoview and how they affect his preoperative risk assessment.  PREOPERATIVE CARDIAC RISK ASSESSMENT   Revised Cardiac Risk Index:  High Risk Surgery: no; Intermediate (infrainguinal)  Defined as Intraperitoneal, intrathoracic or suprainguinal vascular  Active CAD: yes; No active Angina & no ischemia on Myoview.  CHF: yes; with recent exacerbation, now stable  Cerebrovascular Disease: no;   Diabetes: yes; On Insulin: yes  CKD (Cr >~ 2): no;    Total: 2-3 (he does have CAD, but technically "not active" Estimated Risk of Adverse Outcome: Intermediate to High  Estimated Risk of MI, PE, VF/VT (Cardiac Arrest), Complete Heart Block: 6.6 % (with negative Myoview); >11% when considering known CAD  On stable BB dose - partially mitigates this risk (drops by ~3%)   ACC/AHA Guidelines for "Clearance":  Step 1 - Need for Emergency Surgery: No: but with lifestyle limiting claudication, is urgent.  If Yes - go straight to OR with perioperative surveillance  Step 2 - Active Cardiac Conditions (Unstable Angina, Decompensated HF, Significant  Arrhytmias - Complete HB, Mobitz II, Symptomatic VT or SVT, Severe Aortic Stenosis - mean gradient > 40 mmHg, Valve area < 1.0 cm2):   No: Has large infarct area with No Ischemia note don Myoview.  If Yes - Evaluate & Treat per ACC/AHA Guidelines  Step 3 -  Low Risk Surgery: No: Intermediate Risk  If Yes --> proceed to OR  If No --> Step 4  Step 4 - Functional Capacity >= 4 METS without symptoms: No: Unable to exert due to claudicaiton  If Yes --> proceed to OR  If No --> Step 5  Step 5 --  Clinical Risk Factors (CRF)   3 or more: Yes  If Yes -- assess Surgical Risk, --   (High Risk Non-cardiac), Intraabdominal or thoracic vascular surgery consider testing if it will change management.  Intermediate Risk: Proceed to OR with HR control, or consider testing if it will change management --> Surgery is Intermediate RISK    Recommendation:   Would proceed to or with no further cardiac evaluation as he has now had an echocardiogram  and Myoview in the last month. He does not have any active anginal symptoms, and his heart there is stable. With an intermediate risk surgery, recommendation would be to proceed with medical management including beta blocker. Based on his ejection fraction roughly 40%, would recommend close cardiac monitoring pre-, peri-, and postop.  [facility] will be  available for consultation if necessary.    Leonie Man, M.D., M.S. Interventional Cardiologist  [place] Pager # [phone] 09/11/2013

## 2013-09-11 ENCOUNTER — Telehealth: Payer: Self-pay

## 2013-09-11 ENCOUNTER — Other Ambulatory Visit: Payer: Self-pay

## 2013-09-11 NOTE — Telephone Encounter (Signed)
Dr. Imogene Burn gave verbal order to schedule Bilateral LE GSV vein mapping for pre-op evaluation.  Also gave order to schedule pt. For Left Femoral - Below Knee Popliteal Bypass 09/16/13.  Stated he rec'd cardiac clearance from Dr. Susette Racer for pt. to proceed with surgery.  Notified pt. of plan for surgery on 3/18.  Agreed with plan.  Gave appt. to pt. for 10:30 AM 3/16 for bilat. LE vein mapping.  Advised he will receive pre-op appt. from [facility].  Verb. Understanding.

## 2013-09-14 ENCOUNTER — Ambulatory Visit (HOSPITAL_COMMUNITY)
Admission: RE | Admit: 2013-09-14 | Discharge: 2013-09-14 | Disposition: A | Discharge status: Home / Self Care | Payer: No Typology Code available for payment source | Source: Ambulatory Visit | Attending: Vascular Surgery | Admitting: Vascular Surgery

## 2013-09-14 ENCOUNTER — Encounter (HOSPITAL_COMMUNITY): Payer: Self-pay

## 2013-09-14 ENCOUNTER — Encounter (HOSPITAL_COMMUNITY)
Admission: RE | Admit: 2013-09-14 | Discharge: 2013-09-14 | Disposition: A | Discharge status: Home / Self Care | Payer: No Typology Code available for payment source | Source: Ambulatory Visit | Attending: Anesthesiology | Admitting: Anesthesiology

## 2013-09-14 ENCOUNTER — Encounter (HOSPITAL_COMMUNITY)
Admission: RE | Admit: 2013-09-14 | Discharge: 2013-09-14 | Disposition: A | Discharge status: Home / Self Care | Payer: No Typology Code available for payment source | Source: Ambulatory Visit | Attending: Vascular Surgery | Admitting: Vascular Surgery

## 2013-09-14 DIAGNOSIS — I739 Peripheral vascular disease, unspecified: Secondary | ICD-10-CM | POA: Insufficient documentation

## 2013-09-14 DIAGNOSIS — Z01810 Encounter for preprocedural cardiovascular examination: Secondary | ICD-10-CM | POA: Insufficient documentation

## 2013-09-14 LAB — SURGICAL PCR SCREEN
MRSA, PCR: NEGATIVE
Staphylococcus aureus: POSITIVE — AB

## 2013-09-14 LAB — TYPE AND SCREEN
ABO/RH(D): O POS
Antibody Screen: NEGATIVE

## 2013-09-14 LAB — CBC
HCT: 41.5 % (ref 39.0–52.0)
HEMOGLOBIN: 14.2 g/dL (ref 13.0–17.0)
MCH: 29.5 pg (ref 26.0–34.0)
MCHC: 34.2 g/dL (ref 30.0–36.0)
MCV: 86.3 fL (ref 78.0–100.0)
PLATELETS: 181 10*3/uL (ref 150–400)
RBC: 4.81 MIL/uL (ref 4.22–5.81)
RDW: 13.5 % (ref 11.5–15.5)
WBC: 5.2 10*3/uL (ref 4.0–10.5)

## 2013-09-14 LAB — URINALYSIS, ROUTINE W REFLEX MICROSCOPIC
BILIRUBIN URINE: NEGATIVE
GLUCOSE, UA: 250 mg/dL — AB
HGB URINE DIPSTICK: NEGATIVE
KETONES UR: NEGATIVE mg/dL
Leukocytes, UA: NEGATIVE
Nitrite: NEGATIVE
PROTEIN: NEGATIVE mg/dL
Specific Gravity, Urine: 1.024 (ref 1.005–1.030)
UROBILINOGEN UA: 0.2 mg/dL (ref 0.0–1.0)
pH: 5 (ref 5.0–8.0)

## 2013-09-14 LAB — COMPREHENSIVE METABOLIC PANEL
ALT: 35 U/L (ref 0–53)
AST: 36 U/L (ref 0–37)
Albumin: 3.4 g/dL — ABNORMAL LOW (ref 3.5–5.2)
Alkaline Phosphatase: 50 U/L (ref 39–117)
BUN: 19 mg/dL (ref 6–23)
CALCIUM: 8.9 mg/dL (ref 8.4–10.5)
CO2: 24 meq/L (ref 19–32)
CREATININE: 0.72 mg/dL (ref 0.50–1.35)
Chloride: 100 mEq/L (ref 96–112)
GLUCOSE: 238 mg/dL — AB (ref 70–99)
Potassium: 5 mEq/L (ref 3.7–5.3)
Sodium: 134 mEq/L — ABNORMAL LOW (ref 137–147)
Total Bilirubin: 0.8 mg/dL (ref 0.3–1.2)
Total Protein: 6.6 g/dL (ref 6.0–8.3)

## 2013-09-14 LAB — PROTIME-INR
INR: 1.01 (ref 0.00–1.49)
PROTHROMBIN TIME: 13.1 s (ref 11.6–15.2)

## 2013-09-14 LAB — APTT: aPTT: 31 seconds (ref 24–37)

## 2013-09-14 NOTE — Progress Notes (Signed)
  Pt was told to stop Xarelto by Dr. Imogene Burn on 09/11/13.   PCR "+" for MSSA. Mupirocin called in to [facility] on [address]. Pt also called and informed.

## 2013-09-14 NOTE — Pre-Procedure Instructions (Signed)
BRAXDYN PERA  09/14/2013   Your procedure is scheduled on:  Wednesday, September 16, 2013  Report to [facility] Entrance "A" [address] at [employer] AM.  Call this number if you have problems the morning of surgery: [phone]   Remember:   Do not eat food or drink liquids after midnight.     Take these medicines the morning of surgery with A SIP OF WATER: carvedilol (Coreg) STOP taking Rivaroxaban (Xarelto) as directed by physician. Stop taking Aspirin, Goody's, BC's, Aleve (Naproxen), Ibuprofen (Advil or Motrin), Fish Oil, Vitamins, Herbal Supplements or any substance that could thin your blood starting today, 09/14/13.   Do not wear jewelry.  Do not wear lotions or powders.  Do not shave 48 hours prior to surgery. Men may shave face and neck.  Do not bring valuables to the hospital.  [facility] is not responsible                  for any belongings or valuables.               Contacts, dentures or bridgework may not be worn into surgery.  Leave suitcase in the car. After surgery it may be brought to your room.  For patients admitted to the hospital, discharge time is determined by your                treatment team.                 Special Instructions: Please use CHG soap the night before surgery and the day of surgery. CHG soap should be used atleast twice.   Please read over the following fact sheets that you were given: Pain Booklet, Coughing and Deep Breathing, Blood Transfusion Information, MRSA Information and Surgical Site Infection Prevention.

## 2013-09-15 ENCOUNTER — Encounter (HOSPITAL_COMMUNITY): Payer: Self-pay | Admitting: Pharmacy Technician

## 2013-09-15 MED ORDER — DEXTROSE 5 % IV SOLN
1.5000 g | INTRAVENOUS | Status: AC
  Administered 2013-09-16: 1.5 g via INTRAVENOUS
  Filled 2013-09-15: qty 1.5

## 2013-09-16 ENCOUNTER — Inpatient Hospital Stay (HOSPITAL_COMMUNITY): Payer: No Typology Code available for payment source

## 2013-09-16 ENCOUNTER — Inpatient Hospital Stay (HOSPITAL_COMMUNITY)
Admission: RE | Admit: 2013-09-16 | Discharge: 2013-09-18 | DRG: 253 | Disposition: A | Discharge status: Home / Self Care | Payer: No Typology Code available for payment source | Source: Ambulatory Visit | Attending: Vascular Surgery | Admitting: Vascular Surgery

## 2013-09-16 ENCOUNTER — Encounter (HOSPITAL_COMMUNITY): Payer: No Typology Code available for payment source | Admitting: Critical Care Medicine

## 2013-09-16 ENCOUNTER — Encounter (HOSPITAL_COMMUNITY): Payer: Self-pay | Admitting: Critical Care Medicine

## 2013-09-16 ENCOUNTER — Inpatient Hospital Stay (HOSPITAL_COMMUNITY): Payer: No Typology Code available for payment source | Admitting: Critical Care Medicine

## 2013-09-16 ENCOUNTER — Encounter (HOSPITAL_COMMUNITY)
Admission: RE | Disposition: A | Discharge status: Home / Self Care | Payer: Self-pay | Source: Ambulatory Visit | Attending: Vascular Surgery

## 2013-09-16 DIAGNOSIS — IMO0002 Reserved for concepts with insufficient information to code with codable children: Secondary | ICD-10-CM

## 2013-09-16 DIAGNOSIS — M1710 Unilateral primary osteoarthritis, unspecified knee: Secondary | ICD-10-CM | POA: Diagnosis present

## 2013-09-16 DIAGNOSIS — Z794 Long term (current) use of insulin: Secondary | ICD-10-CM

## 2013-09-16 DIAGNOSIS — E1165 Type 2 diabetes mellitus with hyperglycemia: Secondary | ICD-10-CM

## 2013-09-16 DIAGNOSIS — Z5987 Material hardship due to limited financial resources, not elsewhere classified: Secondary | ICD-10-CM

## 2013-09-16 DIAGNOSIS — D62 Acute posthemorrhagic anemia: Secondary | ICD-10-CM | POA: Diagnosis not present

## 2013-09-16 DIAGNOSIS — I70229 Atherosclerosis of native arteries of extremities with rest pain, unspecified extremity: Secondary | ICD-10-CM

## 2013-09-16 DIAGNOSIS — I2510 Atherosclerotic heart disease of native coronary artery without angina pectoris: Secondary | ICD-10-CM | POA: Diagnosis present

## 2013-09-16 DIAGNOSIS — Z8619 Personal history of other infectious and parasitic diseases: Secondary | ICD-10-CM

## 2013-09-16 DIAGNOSIS — G609 Hereditary and idiopathic neuropathy, unspecified: Secondary | ICD-10-CM | POA: Diagnosis present

## 2013-09-16 DIAGNOSIS — I252 Old myocardial infarction: Secondary | ICD-10-CM

## 2013-09-16 DIAGNOSIS — I7092 Chronic total occlusion of artery of the extremities: Secondary | ICD-10-CM | POA: Diagnosis present

## 2013-09-16 DIAGNOSIS — I10 Essential (primary) hypertension: Secondary | ICD-10-CM | POA: Diagnosis present

## 2013-09-16 DIAGNOSIS — Z598 Other problems related to housing and economic circumstances: Secondary | ICD-10-CM

## 2013-09-16 DIAGNOSIS — I701 Atherosclerosis of renal artery: Secondary | ICD-10-CM | POA: Diagnosis present

## 2013-09-16 DIAGNOSIS — I5032 Chronic diastolic (congestive) heart failure: Secondary | ICD-10-CM | POA: Diagnosis present

## 2013-09-16 DIAGNOSIS — I509 Heart failure, unspecified: Secondary | ICD-10-CM | POA: Diagnosis present

## 2013-09-16 DIAGNOSIS — Z7982 Long term (current) use of aspirin: Secondary | ICD-10-CM

## 2013-09-16 DIAGNOSIS — I4891 Unspecified atrial fibrillation: Secondary | ICD-10-CM | POA: Diagnosis present

## 2013-09-16 DIAGNOSIS — Z951 Presence of aortocoronary bypass graft: Secondary | ICD-10-CM

## 2013-09-16 DIAGNOSIS — Z7901 Long term (current) use of anticoagulants: Secondary | ICD-10-CM

## 2013-09-16 DIAGNOSIS — I2589 Other forms of chronic ischemic heart disease: Secondary | ICD-10-CM | POA: Diagnosis present

## 2013-09-16 DIAGNOSIS — I70219 Atherosclerosis of native arteries of extremities with intermittent claudication, unspecified extremity: Principal | ICD-10-CM | POA: Diagnosis present

## 2013-09-16 DIAGNOSIS — I739 Peripheral vascular disease, unspecified: Secondary | ICD-10-CM | POA: Diagnosis present

## 2013-09-16 DIAGNOSIS — Z87891 Personal history of nicotine dependence: Secondary | ICD-10-CM

## 2013-09-16 DIAGNOSIS — E1140 Type 2 diabetes mellitus with diabetic neuropathy, unspecified: Secondary | ICD-10-CM

## 2013-09-16 DIAGNOSIS — E1149 Type 2 diabetes mellitus with other diabetic neurological complication: Secondary | ICD-10-CM | POA: Diagnosis present

## 2013-09-16 HISTORY — PX: INTRAOPERATIVE ARTERIOGRAM: SHX5157

## 2013-09-16 HISTORY — PX: ENDARTERECTOMY POPLITEAL: SHX5806

## 2013-09-16 HISTORY — PX: GROIN DISSECTION: SHX5250

## 2013-09-16 HISTORY — PX: PATCH ANGIOPLASTY: SHX6230

## 2013-09-16 LAB — GLUCOSE, CAPILLARY
GLUCOSE-CAPILLARY: 115 mg/dL — AB (ref 70–99)
Glucose-Capillary: 130 mg/dL — ABNORMAL HIGH (ref 70–99)
Glucose-Capillary: 133 mg/dL — ABNORMAL HIGH (ref 70–99)

## 2013-09-16 SURGERY — ANGIOPLASTY, USING PATCH GRAFT
Anesthesia: General | Site: Leg Lower | Laterality: Left

## 2013-09-16 MED ORDER — BISACODYL 5 MG PO TBEC: 5.0000 mg | DELAYED_RELEASE_TABLET | Freq: Every day | ORAL | Status: DC | PRN

## 2013-09-16 MED ORDER — INSULIN GLARGINE 100 UNIT/ML ~~LOC~~ SOLN
45.0000 [IU] | Freq: Every day | SUBCUTANEOUS | Status: DC
  Administered 2013-09-17: 45 [IU] via SUBCUTANEOUS
  Filled 2013-09-16 (×3): qty 0.45

## 2013-09-16 MED ORDER — HYDROMORPHONE HCL PF 1 MG/ML IJ SOLN
0.2500 mg | INTRAMUSCULAR | Status: DC | PRN
  Administered 2013-09-16 (×2): 0.5 mg via INTRAVENOUS

## 2013-09-16 MED ORDER — NEOSTIGMINE METHYLSULFATE 1 MG/ML IJ SOLN
INTRAMUSCULAR | Status: DC | PRN
  Administered 2013-09-16: 3 mg via INTRAVENOUS

## 2013-09-16 MED ORDER — PHENYLEPHRINE HCL 10 MG/ML IJ SOLN
10.0000 mg | INTRAVENOUS | Status: DC | PRN
  Administered 2013-09-16: 15 ug/min via INTRAVENOUS

## 2013-09-16 MED ORDER — TRAMADOL HCL 50 MG PO TABS
50.0000 mg | ORAL_TABLET | ORAL | Status: DC | PRN
  Administered 2013-09-17 – 2013-09-18 (×5): 100 mg via ORAL
  Filled 2013-09-16 (×5): qty 2

## 2013-09-16 MED ORDER — ASPIRIN 81 MG PO CHEW
81.0000 mg | CHEWABLE_TABLET | Freq: Every day | ORAL | Status: DC
  Administered 2013-09-16 – 2013-09-18 (×3): 81 mg via ORAL
  Filled 2013-09-16 (×3): qty 1

## 2013-09-16 MED ORDER — FENTANYL CITRATE 0.05 MG/ML IJ SOLN
INTRAMUSCULAR | Status: DC | PRN
  Administered 2013-09-16 (×2): 50 ug via INTRAVENOUS
  Administered 2013-09-16: 150 ug via INTRAVENOUS
  Administered 2013-09-16 (×2): 50 ug via INTRAVENOUS

## 2013-09-16 MED ORDER — OXYCODONE HCL 5 MG/5ML PO SOLN: 5.0000 mg | Freq: Once | ORAL | Status: AC | PRN

## 2013-09-16 MED ORDER — LABETALOL HCL 5 MG/ML IV SOLN
10.0000 mg | INTRAVENOUS | Status: DC | PRN
  Filled 2013-09-16: qty 4

## 2013-09-16 MED ORDER — DOPAMINE-DEXTROSE 3.2-5 MG/ML-% IV SOLN: 3.0000 ug/kg/min | INTRAVENOUS | Status: DC

## 2013-09-16 MED ORDER — SODIUM CHLORIDE 0.9 % IV SOLN
INTRAVENOUS | Status: DC
  Administered 2013-09-16 – 2013-09-17 (×2): via INTRAVENOUS

## 2013-09-16 MED ORDER — PANTOPRAZOLE SODIUM 40 MG PO TBEC
40.0000 mg | DELAYED_RELEASE_TABLET | Freq: Every day | ORAL | Status: DC
  Administered 2013-09-16 – 2013-09-18 (×3): 40 mg via ORAL
  Filled 2013-09-16 (×3): qty 1

## 2013-09-16 MED ORDER — HYDROMORPHONE HCL PF 1 MG/ML IJ SOLN
INTRAMUSCULAR | Status: AC
  Administered 2013-09-16 (×2): 0.5 mg
  Filled 2013-09-16: qty 1

## 2013-09-16 MED ORDER — ONDANSETRON HCL 4 MG/2ML IJ SOLN
INTRAMUSCULAR | Status: AC
  Filled 2013-09-16: qty 2

## 2013-09-16 MED ORDER — PHENOL 1.4 % MT LIQD: 1.0000 | OROMUCOSAL | Status: DC | PRN

## 2013-09-16 MED ORDER — GUAIFENESIN-DM 100-10 MG/5ML PO SYRP: 15.0000 mL | ORAL_SOLUTION | ORAL | Status: DC | PRN

## 2013-09-16 MED ORDER — THROMBIN 20000 UNITS EX SOLR
CUTANEOUS | Status: DC | PRN
  Administered 2013-09-16: 13:00:00 via TOPICAL

## 2013-09-16 MED ORDER — INSULIN ASPART 100 UNIT/ML ~~LOC~~ SOLN
0.0000 [IU] | Freq: Three times a day (TID) | SUBCUTANEOUS | Status: DC
  Administered 2013-09-17 (×2): 7 [IU] via SUBCUTANEOUS
  Administered 2013-09-17: 1 [IU] via SUBCUTANEOUS
  Administered 2013-09-18: 3 [IU] via SUBCUTANEOUS
  Administered 2013-09-18 (×2): 4 [IU] via SUBCUTANEOUS

## 2013-09-16 MED ORDER — MIDAZOLAM HCL 2 MG/2ML IJ SOLN
INTRAMUSCULAR | Status: AC
  Filled 2013-09-16: qty 2

## 2013-09-16 MED ORDER — ONDANSETRON HCL 4 MG/2ML IJ SOLN: 4.0000 mg | Freq: Four times a day (QID) | INTRAMUSCULAR | Status: DC | PRN

## 2013-09-16 MED ORDER — ACETAMINOPHEN 650 MG RE SUPP: 325.0000 mg | RECTAL | Status: DC | PRN

## 2013-09-16 MED ORDER — ACETAMINOPHEN 325 MG PO TABS: 325.0000 mg | ORAL_TABLET | ORAL | Status: DC | PRN

## 2013-09-16 MED ORDER — HEPARIN SODIUM (PORCINE) 1000 UNIT/ML IJ SOLN
INTRAMUSCULAR | Status: AC
  Filled 2013-09-16: qty 1

## 2013-09-16 MED ORDER — OXYCODONE HCL 5 MG PO TABS
5.0000 mg | ORAL_TABLET | Freq: Once | ORAL | Status: AC | PRN
  Administered 2013-09-16: 5 mg via ORAL

## 2013-09-16 MED ORDER — THROMBIN 20000 UNITS EX SOLR
CUTANEOUS | Status: AC
  Filled 2013-09-16: qty 20000

## 2013-09-16 MED ORDER — CHLORHEXIDINE GLUCONATE 4 % EX LIQD
60.0000 mL | Freq: Once | CUTANEOUS | Status: DC
  Filled 2013-09-16: qty 60

## 2013-09-16 MED ORDER — OXYCODONE HCL 5 MG PO TABS
ORAL_TABLET | ORAL | Status: AC
  Filled 2013-09-16: qty 1

## 2013-09-16 MED ORDER — NEOSTIGMINE METHYLSULFATE 1 MG/ML IJ SOLN
INTRAMUSCULAR | Status: AC
  Filled 2013-09-16: qty 10

## 2013-09-16 MED ORDER — SODIUM CHLORIDE 0.9 % IV SOLN: 500.0000 mL | Freq: Once | INTRAVENOUS | Status: AC | PRN

## 2013-09-16 MED ORDER — GLYCOPYRROLATE 0.2 MG/ML IJ SOLN
INTRAMUSCULAR | Status: DC | PRN
Start: 2013-09-16 — End: 2013-09-16
  Administered 2013-09-16: 0.4 mg via INTRAVENOUS

## 2013-09-16 MED ORDER — DOCUSATE SODIUM 100 MG PO CAPS
100.0000 mg | ORAL_CAPSULE | Freq: Every day | ORAL | Status: DC
  Administered 2013-09-17 – 2013-09-18 (×2): 100 mg via ORAL
  Filled 2013-09-16 (×2): qty 1

## 2013-09-16 MED ORDER — POTASSIUM CHLORIDE ER 10 MEQ PO TBCR: 10.0000 meq | EXTENDED_RELEASE_TABLET | Freq: Two times a day (BID) | ORAL | Status: DC

## 2013-09-16 MED ORDER — RAMIPRIL 10 MG PO CAPS
10.0000 mg | ORAL_CAPSULE | Freq: Every day | ORAL | Status: DC
  Administered 2013-09-16 – 2013-09-18 (×3): 10 mg via ORAL
  Filled 2013-09-16 (×3): qty 1

## 2013-09-16 MED ORDER — HYDROMORPHONE HCL PF 1 MG/ML IJ SOLN
INTRAMUSCULAR | Status: AC
  Filled 2013-09-16: qty 1

## 2013-09-16 MED ORDER — GLYCOPYRROLATE 0.2 MG/ML IJ SOLN
INTRAMUSCULAR | Status: AC
  Filled 2013-09-16: qty 2

## 2013-09-16 MED ORDER — ALUM & MAG HYDROXIDE-SIMETH 200-200-20 MG/5ML PO SUSP: 15.0000 mL | ORAL | Status: DC | PRN

## 2013-09-16 MED ORDER — PROPOFOL 10 MG/ML IV BOLUS
INTRAVENOUS | Status: AC
  Filled 2013-09-16: qty 20

## 2013-09-16 MED ORDER — 0.9 % SODIUM CHLORIDE (POUR BTL) OPTIME
TOPICAL | Status: DC | PRN
  Administered 2013-09-16 (×2): 1000 mL

## 2013-09-16 MED ORDER — PAPAVERINE HCL 30 MG/ML IJ SOLN
INTRAMUSCULAR | Status: AC
  Filled 2013-09-16: qty 2

## 2013-09-16 MED ORDER — LACTATED RINGERS IV SOLN
INTRAVENOUS | Status: DC | PRN
  Administered 2013-09-16 (×2): via INTRAVENOUS

## 2013-09-16 MED ORDER — PROPOFOL 10 MG/ML IV BOLUS
INTRAVENOUS | Status: DC | PRN
  Administered 2013-09-16 (×2): 50 mg via INTRAVENOUS

## 2013-09-16 MED ORDER — MIDAZOLAM HCL 5 MG/5ML IJ SOLN
INTRAMUSCULAR | Status: DC | PRN
  Administered 2013-09-16: 2 mg via INTRAVENOUS

## 2013-09-16 MED ORDER — DEXTROSE 5 % IV SOLN
1.5000 g | Freq: Two times a day (BID) | INTRAVENOUS | Status: AC
  Administered 2013-09-16 – 2013-09-17 (×2): 1.5 g via INTRAVENOUS
  Filled 2013-09-16 (×2): qty 1.5

## 2013-09-16 MED ORDER — SENNOSIDES-DOCUSATE SODIUM 8.6-50 MG PO TABS
1.0000 | ORAL_TABLET | Freq: Every evening | ORAL | Status: DC | PRN
  Administered 2013-09-17: 1 via ORAL
  Filled 2013-09-16: qty 1

## 2013-09-16 MED ORDER — MORPHINE SULFATE 2 MG/ML IJ SOLN
2.0000 mg | INTRAMUSCULAR | Status: DC | PRN
  Administered 2013-09-16 – 2013-09-17 (×2): 2 mg via INTRAVENOUS
  Filled 2013-09-16 (×2): qty 1

## 2013-09-16 MED ORDER — ASPIRIN 81 MG PO TABS: 81.0000 mg | ORAL_TABLET | Freq: Every day | ORAL | Status: DC

## 2013-09-16 MED ORDER — LIDOCAINE HCL (CARDIAC) 20 MG/ML IV SOLN
INTRAVENOUS | Status: DC | PRN
  Administered 2013-09-16: 100 mg via INTRAVENOUS

## 2013-09-16 MED ORDER — ENOXAPARIN SODIUM 40 MG/0.4ML ~~LOC~~ SOLN
40.0000 mg | SUBCUTANEOUS | Status: DC
  Administered 2013-09-16 – 2013-09-17 (×2): 40 mg via SUBCUTANEOUS
  Filled 2013-09-16 (×3): qty 0.4

## 2013-09-16 MED ORDER — METOPROLOL TARTRATE 1 MG/ML IV SOLN: 2.0000 mg | INTRAVENOUS | Status: DC | PRN

## 2013-09-16 MED ORDER — SODIUM CHLORIDE 0.9 % IV SOLN: INTRAVENOUS | Status: DC

## 2013-09-16 MED ORDER — IOHEXOL 300 MG/ML  SOLN
INTRAMUSCULAR | Status: DC | PRN
  Administered 2013-09-16: 10 mL via INTRA_ARTERIAL

## 2013-09-16 MED ORDER — SODIUM CHLORIDE 0.9 % IR SOLN
Status: DC | PRN
  Administered 2013-09-16: 09:00:00

## 2013-09-16 MED ORDER — FUROSEMIDE 40 MG PO TABS
40.0000 mg | ORAL_TABLET | Freq: Every day | ORAL | Status: DC
  Administered 2013-09-16 – 2013-09-18 (×3): 40 mg via ORAL
  Filled 2013-09-16 (×3): qty 1

## 2013-09-16 MED ORDER — ONDANSETRON HCL 4 MG/2ML IJ SOLN
INTRAMUSCULAR | Status: DC | PRN
  Administered 2013-09-16: 4 mg via INTRAVENOUS

## 2013-09-16 MED ORDER — MAGNESIUM SULFATE 40 MG/ML IJ SOLN
2.0000 g | Freq: Every day | INTRAMUSCULAR | Status: DC | PRN
  Filled 2013-09-16: qty 50

## 2013-09-16 MED ORDER — POTASSIUM CHLORIDE CRYS ER 20 MEQ PO TBCR: 20.0000 meq | EXTENDED_RELEASE_TABLET | Freq: Every day | ORAL | Status: DC | PRN

## 2013-09-16 MED ORDER — PROTAMINE SULFATE 10 MG/ML IV SOLN
INTRAVENOUS | Status: DC | PRN
  Administered 2013-09-16: 50 mg via INTRAVENOUS

## 2013-09-16 MED ORDER — PNEUMOCOCCAL VAC POLYVALENT 25 MCG/0.5ML IJ INJ
0.5000 mL | INJECTION | INTRAMUSCULAR | Status: AC
  Administered 2013-09-18: 0.5 mL via INTRAMUSCULAR
  Filled 2013-09-16 (×2): qty 0.5

## 2013-09-16 MED ORDER — CARVEDILOL 25 MG PO TABS
37.5000 mg | ORAL_TABLET | Freq: Two times a day (BID) | ORAL | Status: DC
  Administered 2013-09-17 – 2013-09-18 (×4): 37.5 mg via ORAL
  Filled 2013-09-16 (×5): qty 1

## 2013-09-16 MED ORDER — ROCURONIUM BROMIDE 100 MG/10ML IV SOLN
INTRAVENOUS | Status: DC | PRN
  Administered 2013-09-16: 50 mg via INTRAVENOUS

## 2013-09-16 MED ORDER — HYDRALAZINE HCL 20 MG/ML IJ SOLN: 10.0000 mg | INTRAMUSCULAR | Status: DC | PRN

## 2013-09-16 MED ORDER — ATORVASTATIN CALCIUM 20 MG PO TABS
20.0000 mg | ORAL_TABLET | Freq: Every day | ORAL | Status: DC
  Administered 2013-09-17 – 2013-09-18 (×2): 20 mg via ORAL
  Filled 2013-09-16 (×2): qty 1

## 2013-09-16 MED ORDER — PROTAMINE SULFATE 10 MG/ML IV SOLN
INTRAVENOUS | Status: AC
  Filled 2013-09-16: qty 5

## 2013-09-16 MED ORDER — FENTANYL CITRATE 0.05 MG/ML IJ SOLN
INTRAMUSCULAR | Status: AC
  Filled 2013-09-16: qty 5

## 2013-09-16 MED ORDER — HEPARIN SODIUM (PORCINE) 1000 UNIT/ML IJ SOLN
INTRAMUSCULAR | Status: DC | PRN
  Administered 2013-09-16: 2000 [IU] via INTRAVENOUS
  Administered 2013-09-16: 8000 [IU] via INTRAVENOUS

## 2013-09-16 MED ORDER — PROMETHAZINE HCL 25 MG/ML IJ SOLN: 6.2500 mg | INTRAMUSCULAR | Status: DC | PRN

## 2013-09-16 SURGICAL SUPPLY — 76 items
ADH SKN CLS APL DERMABOND .7 (GAUZE/BANDAGES/DRESSINGS) ×4
BANDAGE ELASTIC 4 VELCRO ST LF (GAUZE/BANDAGES/DRESSINGS) IMPLANT
BANDAGE ESMARK 6X9 LF (GAUZE/BANDAGES/DRESSINGS) IMPLANT
BNDG CMPR 9X6 STRL LF SNTH (GAUZE/BANDAGES/DRESSINGS) ×2
BNDG ESMARK 6X9 LF (GAUZE/BANDAGES/DRESSINGS) ×4
CANISTER SUCTION 2500CC (MISCELLANEOUS) ×4 IMPLANT
CATH EMB 3FR 40CM (CATHETERS) ×2 IMPLANT
CLIP TI MEDIUM 24 (CLIP) ×4 IMPLANT
CLIP TI WIDE RED SMALL 24 (CLIP) ×4 IMPLANT
COVER PROBE W GEL 5X96 (DRAPES) ×4 IMPLANT
COVER SURGICAL LIGHT HANDLE (MISCELLANEOUS) ×4 IMPLANT
CUFF TOURNIQUET SINGLE 24IN (TOURNIQUET CUFF) IMPLANT
CUFF TOURNIQUET SINGLE 34IN LL (TOURNIQUET CUFF) ×3 IMPLANT
CUFF TOURNIQUET SINGLE 44IN (TOURNIQUET CUFF) IMPLANT
DERMABOND ADVANCED (GAUZE/BANDAGES/DRESSINGS) ×4
DERMABOND ADVANCED .7 DNX12 (GAUZE/BANDAGES/DRESSINGS) IMPLANT
DRAIN CHANNEL 15F RND FF W/TCR (WOUND CARE) IMPLANT
DRAPE C-ARM 42X72 X-RAY (DRAPES) ×4 IMPLANT
DRAPE WARM FLUID 44X44 (DRAPE) ×4 IMPLANT
DRSG COVADERM 4X10 (GAUZE/BANDAGES/DRESSINGS) IMPLANT
DRSG COVADERM 4X6 (GAUZE/BANDAGES/DRESSINGS) ×2 IMPLANT
DRSG COVADERM 4X8 (GAUZE/BANDAGES/DRESSINGS) ×3 IMPLANT
ELECT REM PT RETURN 9FT ADLT (ELECTROSURGICAL) ×4
ELECTRODE REM PT RTRN 9FT ADLT (ELECTROSURGICAL) ×2 IMPLANT
EVACUATOR SILICONE 100CC (DRAIN) IMPLANT
GAUZE SPONGE 4X4 16PLY XRAY LF (GAUZE/BANDAGES/DRESSINGS) ×2 IMPLANT
GLOVE BIO SURGEON STRL SZ7 (GLOVE) ×8 IMPLANT
GLOVE BIOGEL PI IND STRL 6.5 (GLOVE) IMPLANT
GLOVE BIOGEL PI IND STRL 7.0 (GLOVE) IMPLANT
GLOVE BIOGEL PI IND STRL 7.5 (GLOVE) ×2 IMPLANT
GLOVE BIOGEL PI INDICATOR 6.5 (GLOVE) ×2
GLOVE BIOGEL PI INDICATOR 7.0 (GLOVE) ×4
GLOVE BIOGEL PI INDICATOR 7.5 (GLOVE) ×8
GLOVE ECLIPSE 6.5 STRL STRAW (GLOVE) ×3 IMPLANT
GLOVE SS BIOGEL STRL SZ 7 (GLOVE) IMPLANT
GLOVE SUPERSENSE BIOGEL SZ 7 (GLOVE) ×2
GOWN STRL REUS W/ TWL LRG LVL3 (GOWN DISPOSABLE) ×7 IMPLANT
GOWN STRL REUS W/ TWL XL LVL3 (GOWN DISPOSABLE) ×2 IMPLANT
GOWN STRL REUS W/TWL LRG LVL3 (GOWN DISPOSABLE) ×16
GOWN STRL REUS W/TWL XL LVL3 (GOWN DISPOSABLE) ×8
INSERT FOGARTY SM (MISCELLANEOUS) ×2 IMPLANT
KIT BASIN OR (CUSTOM PROCEDURE TRAY) ×4 IMPLANT
KIT ROOM TURNOVER OR (KITS) ×4 IMPLANT
MARKER GRAFT CORONARY BYPASS (MISCELLANEOUS) IMPLANT
NS IRRIG 1000ML POUR BTL (IV SOLUTION) ×8 IMPLANT
OMNIPAQUE 300MG/ML  (50ML BOTTLE) ×2 IMPLANT
PACK PERIPHERAL VASCULAR (CUSTOM PROCEDURE TRAY) ×4 IMPLANT
PAD ARMBOARD 7.5X6 YLW CONV (MISCELLANEOUS) ×8 IMPLANT
PADDING CAST COTTON 6X4 STRL (CAST SUPPLIES) IMPLANT
PATCH VASCULAR VASCU GUARD 1X6 (Vascular Products) ×2 IMPLANT
SET MICROPUNCTURE 5F STIFF (MISCELLANEOUS) ×2 IMPLANT
SPONGE SURGIFOAM ABS GEL 100 (HEMOSTASIS) IMPLANT
STAPLER VISISTAT 35W (STAPLE) IMPLANT
STOPCOCK 4 WAY LG BORE MALE ST (IV SETS) ×2 IMPLANT
SUT ETHILON 3 0 PS 1 (SUTURE) IMPLANT
SUT GORETEX 5 0 TT13 24 (SUTURE) IMPLANT
SUT GORETEX 6.0 TT13 (SUTURE) IMPLANT
SUT MNCRL AB 4-0 PS2 18 (SUTURE) ×12 IMPLANT
SUT PROLENE 5 0 C 1 24 (SUTURE) ×13 IMPLANT
SUT PROLENE 6 0 BV (SUTURE) ×6 IMPLANT
SUT PROLENE 6 0 CC (SUTURE) ×3 IMPLANT
SUT PROLENE 7 0 BV 1 (SUTURE) IMPLANT
SUT SILK 2 0 FS (SUTURE) ×4 IMPLANT
SUT SILK 3 0 (SUTURE)
SUT SILK 3-0 18XBRD TIE 12 (SUTURE) IMPLANT
SUT VIC AB 2-0 CT1 27 (SUTURE) ×8
SUT VIC AB 2-0 CT1 TAPERPNT 27 (SUTURE) ×4 IMPLANT
SUT VIC AB 3-0 SH 27 (SUTURE) ×12
SUT VIC AB 3-0 SH 27X BRD (SUTURE) ×6 IMPLANT
SYR 3ML LL SCALE MARK (SYRINGE) ×3 IMPLANT
TOWEL OR 17X24 6PK STRL BLUE (TOWEL DISPOSABLE) ×8 IMPLANT
TOWEL OR 17X26 10 PK STRL BLUE (TOWEL DISPOSABLE) ×8 IMPLANT
TRAY FOLEY CATH 16FRSI W/METER (SET/KITS/TRAYS/PACK) ×4 IMPLANT
TUBING EXTENTION W/L.L. (IV SETS) ×4 IMPLANT
UNDERPAD 30X30 INCONTINENT (UNDERPADS AND DIAPERS) ×4 IMPLANT
WATER STERILE IRR 1000ML POUR (IV SOLUTION) ×4 IMPLANT

## 2013-09-16 NOTE — Op Note (Addendum)
OPERATIVE NOTE   PROCEDURE: 1. Left groin, above-the-knee and below-the-knee popliteal artery exploration 2. Left below-the-knee popliteal artery endarterectomy with bovine patch angioplasty 3. Open cannulation of left common femoral artery  4. Left leg runoff  PRE-OPERATIVE DIAGNOSIS: left foot lifestyle limiting claudication  POST-OPERATIVE DIAGNOSIS: same as above   SURGEON: Leonides SakeBrian Javonte Elenes, MD  ASSISTANT(S): Lianne CureMaureen Collins, PAC   ANESTHESIA: general  ESTIMATED BLOOD LOSS: 100 cc  FINDING(S): 1. Dense scar tissue in left groin 2. Palpable pulse in common femoral artery, profunda femoral artery, and proximal superficial femoral artery  3. Severely calcified below-the-knee popliteal artery: amendable to endarterectomy and bovine patching, trickle blood flow present 4. Severely calcified non-clampable above-the-knee popliteal artery 5. 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 male who presents with short distance claudication affecting his ability to work.  Based on his prior angiograms, I felt an attempt at left common femoral artery to popliteal artery artery bypass was indicated.  Unfortunately, his vein was not adequate to proceed with such as conduit.  The risk, benefits, and alternative for bypass operations were discussed with the patient.  The patient is aware the risks include but are not limited to: bleeding, infection, myocardial infarction, stroke, limb loss, nerve damage, need for additional procedures in the future, wound complications, and inability to complete the bypass.  The patient is aware of these risks and agreed to proceed.  DESCRIPTION: After obtaining full informed written consent, the patient was brought back to the operating room and placed supine upon the operating table.  The patient received IV antibiotics prior to induction.  After obtaining adequate anesthesia, the  patient was prepped and draped in the standard fashion for: left leg bypass.  I made an incision over the prior incision.  Using electrocautery, I slowly dissected through the severely dense scar tissue in the left groin.  Eventually, I was able to dissect out the superficial femoral artery and carry the dissection retrograde to find the profunda femoral artery and common femoral artery.  I was able to get vessel loop control of the mid-common femoral artery, proximal superficial femoral artery and profunda femoral artery.    I then turned my attention to the calf.  I made an incision one finger width behind the tibia.  I dissected through the subcutaneous tissue and fascia with electrocautery.  I was able to retract the medial belly of the gastrocnemius posteriorly and dissect out the medial popliteal vein, which was retracted medially.  I then dissected out the below-the-knee popliteal artery.  This artery was severely calcified and had no doppler signal.  This was not consistent with the angiogram, so I felt exploring the artery was indicated.  The patient was given 8000 units of Heparin intravenously, which was a therapeutic bolus.  In total, [id number] units of Heparin was administrated to achieve and maintain a therapeutic level of anticoagulation.  I waited 3 minutes and then placed a sterile tourniquet on the left thigh and inflated it to 250 mm Hg.  I made an arteriotomy with a 11-blade.  I extended this arteriotomy proximally and distally.  The plaque appeared to be eccentric and medially oriented.  I was able to extract a segment of plaque without lifting an intimal flap.  I dropped the tourniquet and there was only trickle bleeding in this artery.  This was again not consistent with the angiogram, suggesting that the patent  vessel on angiogram is in fact an aberrant hypertrophied tibial artery rather than the popliteal artery.  I reinflated the tourniquet and then sewed a bovine patch onto the popliteal  artery with two running stitches of 6-0 Prolene.  At this point, I cannulated the left common femoral artery with a micropuncture needle.  I placed microwire into the common femoral artery and then exchanged the needle for the microsheath.  I connected the sheath to the IV extension tubing.  I did a hand injection which demonstrated that the artery previously imaged on angiogram was still patent, suggesting that this vessel is indeed an abherrent anterior tibial artery rather than a popliteal artery.  A dry dressing was packed in the calf incision.  At this point, I felt an attempt an exploration of the above the knee popliteal artery was necessary due to the above-the-knee take off of the anterior tibial artery.  I turned my attention to the above-the-knee popliteal exposure.  I made an incision over Hunter's canal and then dissected through the subcutaneous tissue and fascia with electrocautery.  I was able to easily get access to the the above-the-knee popliteal artery.  I dissected it out for ~5 cm segment as it neared the entry into the popliteal space.  I attempted to clamp this segment of the artery, but the calcification in the artery made this impossible, as evident by residual signal even with proximally clamping.  At this point, I felt that no further intervention should be completed.  This patient does not have critical limb ischemia, so a fem-tib bypass with prosthetic graft cannot be justified as this is unlikely to have meaningful patency and will result in limb ischemia once the bypass fails  I turned my attention back to the left groin.  I placed a Z-stitch of 6-0 Prolene around the microsheath and tied the stitch while pulling out the sheath.  I placed thrombin and gelfoam into all incisions and gave 50 mg of Protamine intravenously to reverse anticoagulation.  All incisions were washed out and bleeding points controlled with electrocautery.  There was no further active bleeding.  The calf and  above-the-knee exposure were closed with a deep layer of 2-0 Vicryl in the deep subcutaneous tissue.  A running stitch of 3-0 Vicryl was placed in the superficial subcutaneous tissue.  The skin was reapproximated with a running subcuticular of 4-0 Monocryl.  The skin was cleaned, dried, and reinforced with Dermabond.  In a similar fashion the above-the-knee exposure was closed.  The left groin was closed with a double layer of 2-0 Vicryl in the tissue directly superficial to the left common femoral artery.  The subcutaneous tissue was reapproximated with a double layer of 3-0 Vicryl.  he skin was reapproximated with a running subcuticular of 4-0 Monocryl.  The skin was cleaned, dried, and reinforced with Dermabond.  COMPLICATIONS: inability to complete bypass  CONDITION: stable  [name], MD [facility] Office: [phone] Pager: [phone]  09/16/2013, 2:05 PM

## 2013-09-16 NOTE — Preoperative (Signed)
Beta Blockers   Reason not to administer Beta Blockers:Not Applicable, pt took coreg 09/16/13 @0600 

## 2013-09-16 NOTE — Anesthesia Preprocedure Evaluation (Addendum)
Anesthesia Evaluation  Patient identified by MRN, date of birth, ID band Patient awake    Reviewed: Allergy & Precautions, H&P , NPO status , Patient's Chart, lab work & pertinent test results  Airway Mallampati: II TM Distance: >3 FB Neck ROM: Full    Dental  (+) Dental Advisory Given, Teeth Intact   Pulmonary former smoker,          Cardiovascular hypertension, Pt. on medications and Pt. on home beta blockers + CAD, + Past MI, + CABG, + Peripheral Vascular Disease and +CHF  Note from Dr. Herbie Baltimore preoperatively - Recommendation:   Would proceed to or with no further cardiac evaluation as he has now had an echocardiogram and Myoview in the last month. He does not have any active anginal symptoms, and his heart there is stable. With an intermediate risk surgery, recommendation would be to proceed with medical management including beta blocker. Based on his ejection fraction roughly 40%, would recommend close cardiac monitoring pre-, peri-, and postop.   Neuro/Psych    GI/Hepatic   Endo/Other  diabetes  Renal/GU Renal diseasenegative Renal ROS     Musculoskeletal   Abdominal   Peds  Hematology   Anesthesia Other Findings   Reproductive/Obstetrics                         Anesthesia Physical Anesthesia Plan  ASA: III  Anesthesia Plan: General   Post-op Pain Management:    Induction: Intravenous  Airway Management Planned: Oral ETT  Additional Equipment: Arterial line  Intra-op Plan:   Post-operative Plan: Extubation in OR  Informed Consent: I have reviewed the patients History and Physical, chart, labs and discussed the procedure including the risks, benefits and alternatives for the proposed anesthesia with the patient or authorized representative who has indicated his/her understanding and acceptance.   Dental advisory given  Plan Discussed with: Anesthesiologist, Surgeon and  CRNA  Anesthesia Plan Comments:        Anesthesia Quick Evaluation

## 2013-09-16 NOTE — H&P (View-Only) (Signed)
[facility] [place] HISTORY AND PHYSICAL -PAD  History of Present Illness William Pacheco is a 63 y.o. male patient of Dr. Bridgett Larsson who is s/p R fem-pop BPG by Dr. Amedeo Plenty and L distal SFA stenting (08/30/11) who presents with chief complaint of routine follow up for surveillance. He ran out of his medications for 2 weeks since he could not afford. Was hospitalized at [facility] the end of January, 2015 for CHF, he also has atrial fib and and DM. States he has been taking his medications as prescribed since this hospitalization and his blood sugars are much better. He lost his job last May, 2014, which made his medications unaffordable. Left heel pain for about 6 weeks, tender to touch, he denies ulcers.  He has left calf claudication after walking about 40 feet, relieved with rest, no claudication symptoms elsewhere.  He denies history of stroke or TIA's. But states he had a 6 vessel CABG in 2008, was told he had an MI, he had no symptoms. His left radial artery was harvested for grafting.  The patient denies New Medical or Surgical History.  Pt Diabetic: Yes Pt smoker: former smoker, quit in 2008  Pt meds include: Statin :No Betablocker: Yes ASA: No Other anticoagulants/antiplatelets: taking Xaralto for atrial fib  Past Medical History  Diagnosis Date  . CAD (coronary artery disease), native coronary artery September 2008    Left main 50-60%, LAD 80% with D2 involved. RCA mid occlusion --> CABG  . 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 Ischemic cardiomyopathy September 2008    EF = 35-40%; THE 45-50% IN 2013 --> ECHO 03/03/13 - EF 40-45% LV: Moderate HK of basal-midlateral & inferior myocardium; c/w prior infarction in the distribution of the RCA or LCx. Mild-Mod MR with Calcified annulus. Mod TR  . History of MI (myocardial infarction) 2012    Noted by echocardiography, and nuclear stress testing. Known RCA occlusion prior to CABG   . PAF (paroxysmal atrial fibrillation) - initial presentation with RVR, and heart failure 2013    On a beta blocker, calcium channel blocker & Xarelto. Lexiscan Myoview 11/08/11 LV was enlarged with no evidence of ischemia. There was a moderate sized, moderate to severe in intensity fixed defect in the inferior wall suggestive of scar with no reversible ischemia. There was a basal septal akinesis w/severe global hypokinesis. EF = 31% (Which is different from the [facility] EF)  . Chronic diastolic CHF (congestive heart failure), NYHA class 2 2009, 2011; 2013    Exacerbated by A. fib RVR, currently on Lasix.  . Peripheral arterial disease 2009, 2011; 2013    2009, 2011; 2013  . Renal artery stenosis, non-flow-limiting   . DM (diabetes mellitus), type 2 with neurological complications     Peripheral neuropathy  . Diabetic peripheral neuropathy associated with type 2 diabetes mellitus   . Hyperlipidemia LDL goal <70     On statin therapy  . Obesity (BMI 30.0-34.9)   . Hypertension associated with diabetes   . Erectile dysfunction associated with type 2 diabetes mellitus  2006  . Ischemic mitral regurgitation  1980s    Mild to moderate  . Thrombocytopenia      chronic  . Osteoarthritis of both knees 1990    Status post left knee a arthroscopy, currently Harding right knee  . History of hepatitis B 1980s  . Adopted     Adopted [V68.89]    Social History  History  Substance Use Topics  . Smoking status: Former Smoker -- 0.50 packs/day    Types: Cigarettes    Start date: 07/02/1973    Quit date: 07/02/2005  . Smokeless tobacco: Never Used  . Alcohol Use: No    Family History Family History  Problem Relation Age of Onset  . Adopted: Yes  . Cancer Mother     Past Surgical History  Procedure Laterality Date  . Femoral endarterectomy  03-15-2008    Left EIA & CFA endarterectomy by Dr. Amedeo Plenty  . Knee arthroscopy Left 1990    For osteoarthritis pain.  . Tonsillectomy    . Vascular  surgery    . Pleural effusion drainage Right   . Coronary artery bypass graft  03/2007    CABG x 6 LIMA-LAD, left radial to diagonal 1, SVG to OM1-OM2 sequential, SVG to PDA-PLA sequential.    No Known Allergies  Current Outpatient Prescriptions  Medication Sig Dispense Refill  . aspirin 81 MG tablet Take 81 mg by mouth daily.      . carvedilol (COREG) 25 MG tablet Take 1.5 tablets (37.5 mg total) by mouth 2 (two) times daily with a meal. 1 and 1/2 tablets  90 tablet  0  . furosemide (LASIX) 40 MG tablet Take 1 tablet (40 mg total) by mouth daily.  30 tablet  0  . insulin glargine (LANTUS) 100 UNIT/ML injection Inject 35 Units into the skin at bedtime.      . potassium chloride (K-DUR) 10 MEQ tablet Take 1 tablet (10 mEq total) by mouth 2 (two) times daily.  60 tablet  0  . ramipril (ALTACE) 10 MG capsule Take 1 capsule (10 mg total) by mouth daily.  30 capsule  0  . Rivaroxaban (XARELTO) 20 MG TABS tablet Take 1 tablet (20 mg total) by mouth daily with supper.  30 tablet  0  . [DISCONTINUED] amLODipine (NORVASC) 10 MG tablet Take 10 mg by mouth daily.      . [DISCONTINUED] calcium carbonate (OS-CAL) 600 MG TABS Take 600 mg by mouth daily.      . [DISCONTINUED] pioglitazone-metformin (ACTOPLUS MET) 15-850 MG per tablet Take 1 tablet by mouth 2 (two) times daily with a meal.      . [DISCONTINUED] simvastatin (ZOCOR) 40 MG tablet Take 40 mg by mouth every evening.       No current facility-administered medications for this visit.    ROS: See HPI for pertinent positives and negatives.   Physical Examination  Filed Vitals:   08/21/13 1138  BP: 126/86  Pulse: 71  Resp: 16   Filed Weights   08/21/13 1138  Weight: 232 lb (105.235 kg)   Body mass index is 32.37 kg/(m^2).   General: A&O x 3, WDWN, obese male. Gait: normal Eyes: PERRLA. Pulmonary: CTAB, without wheezes , rales or rhonchi. Cardiac: irregular Rythm , without detected murmur.         Carotid Bruits Left Right    Negative Negative  Aorta is not palpable. Radial pulses: 1+ palpable right, absent left. 1+ palpable left ulnar pulse. (left radial artery used for CABG)                           VASCULAR EXAM: Extremities without ischemic changes  without Gangrene; without open wounds. Both feet are pale, no rubor, no cyanosis.  LE Pulses LEFT RIGHT       FEMORAL  not palpable   palpable        POPLITEAL  not palpable   not palpable       POSTERIOR TIBIAL  not palpable   faintly palpable        DORSALIS PEDIS      ANTERIOR TIBIAL  not palpable  not palpable    Abdomen: soft, NT, no masses. Skin: no rashes, no ulcers noted. Musculoskeletal: no muscle wasting or atrophy.  Neurologic: A&O X 3; Appropriate Affect ; SENSATION: normal; MOTOR FUNCTION:  moving all extremities equally, motor strength 5/5 throughout. Speech is fluent/normal. CN 2-12 intact.   Non-Invasive Vascular Imaging: DATE: 08/21/2013 ABI: RIGHT 0.75, Waveforms: bi and monophasic;  LEFT 0.40, Waveforms: monophasic Previous (04/24/2013) ABI's: Right : 0.87, Left: 0.65  DUPLEX SCAN OF BYPASS:  LOWER EXTREMITY ARTERIAL EVALUATION    INDICATION: Peripheral vascular disease    PREVIOUS INTERVENTION(S): Right femoral-popliteal bypass graft 08/2005; Left superficial femoral stent 01/24/2007 & 08/30/2011    DUPLEX EXAM: Duplex evaluation of the lower extremity arterial system to include the common femoral, superficial femoral, popliteal, and tibial arteries and bypass graft(s) and/or stent(s) if present.      FINDINGS:                                       RIGHT LOWER EXTREMITY:  Heterogeneous plaque present involving the common femoral artery with history of endarterectomy and graft placement. Diminshed monophasic waveform present at the common femoral artery which may be suggestive of a more proximal disease process.    LEFT LOWER  EXTREMITY:  Heterogeneous plaque present throughout the left lower extremity arterial system.  Diminished monophasic flow present at the level of the common femoral artery suggestive of a more proximal disease process.  Dampened diminished flow present throughout the left superficial femoral artery with occlusion at the mid/distal segment.      See the attached diagram for velocities.  IMPRESSION:  Dampened monophasic flow present involving the bilateral arterial inflow suggestive of more proximal disease process. Right common femoral stenosis present in the greater than 50% range. Right femoral to popliteal artery bypass graft is patent with elevated velocities present at the proximal anastomosis suggestive of 50%-70% stenosis. Left Mid to distal superficial femoral artery presents with absent flow suggesting vessel occlusion with history of stent placement. Decrease in the bilateral ankle and toe brachial indices since previous study on 04/24/2013.    ANKLE/BRACHIAL INDEX - Right = 0.75                     Left =   0.40         (Please see complete report)        ASSESSMENT: William Pacheco is a 63 y.o. male who is s/p R fem-pop BPG by Dr. Amedeo Plenty and L distal SFA stenting (08/30/11): who has worsening claudication in left calf, no open ulcers.  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 Dampened monophasic flow present involving the bilateral arterial inflow suggestive of more proximal disease process. Right common femoral stenosis present in the greater than 50% range. Right femoral to popliteal artery bypass graft is patent with elevated velocities present at the proximal anastomosis suggestive of 50%-70%  stenosis. Left Mid to distal superficial femoral artery presents with absent flow suggesting vessel occlusion with history of stent placement.  He  stopped taking all of his medications for a while after he lost his job and could not afford. This means his hyperglycemia worsened, his blood pressure likely remained elevated, both of which contributed to the progression of atherosclerosis. He states he is not taking any statin, he denies any adverse reaction to statins, denies any known liver problems. Will defer to his PCP to start a statin and monitor his lipids and LFT's. Consider intensive statin therapy which is associated with a greater reduction in CVD risk and improved endothelial function , will defer to PCP.    PLAN:  I discussed in depth with the patient the nature of atherosclerosis, and emphasized the importance of maximal medical management including strict control of blood pressure, blood glucose, and lipid levels, obtaining regular exercise, and continued cessation of smoking.  The patient is aware that without maximal medical management the underlying atherosclerotic disease process will progress, limiting the benefit of any interventions.  Based on the patient's vascular studies and examination, and after discussing with Dr. Bridgett Larsson,  pt will be scheduled for repeat angiogram with left leg run-off on Monday, August 31, 2013.  The patient was given information about PAD including signs, symptoms, treatment, what symptoms should prompt the patient to seek immediate medical care, and risk reduction measures to take.  Clemon Chambers, RN, MSN, FNP-C [facility] Phone: [phone]  Clinic MD: Bridgett Larsson  08/21/2013 11:44 AM

## 2013-09-16 NOTE — Interval H&P Note (Signed)
[facility] [place]  History and Physical Update  The patient was interviewed and re-examined.  The patient's previous History and Physical has been reviewed and is unchanged except for: interval angiogram demonstrating occlusion of this patient's popliteal stent and interval cardiology evaluation.  This is this patient's 5+ procedure on the left femoropopliteal artery.  I had discussed with the patient that the stent likely failed due to interval progression of popliteal arterial disease at the level of the knee.  I have recommended left common femoral artery to below-the-popliteal artery bypass.  Unfortunately, his greater saphenous vein has been mostly harvested in both legs, so his only option is a bypass with Propaten.  The risk, benefits, and alternative for bypass operations were discussed with the patient.  The patient is aware the risks include but are not limited to: bleeding, infection, myocardial infarction, stroke, limb loss, nerve damage, need for additional procedures in the future, wound complications, and inability to complete the bypass.  The patient is aware of these risks and agreed to proceed.  [name], MD [facility] [place] Office: [phone] Pager: [phone]  09/16/2013, 8:10 AM

## 2013-09-16 NOTE — Anesthesia Procedure Notes (Addendum)
Procedure Name: Intubation Date/Time: 09/16/2013 8:36 AM Performed by: Elon Alas Pre-anesthesia Checklist: Emergency Drugs available, Patient identified, Timeout performed, Suction available and Patient being monitored Patient Re-evaluated:Patient Re-evaluated prior to inductionOxygen Delivery Method: Circle system utilized Preoxygenation: Pre-oxygenation with 100% oxygen Intubation Type: IV induction Ventilation: Mask ventilation without difficulty Laryngoscope Size: Mac and 4 Grade View: Grade II Tube type: Oral Tube size: 7.5 mm Number of attempts: 1 Airway Equipment and Method: Stylet Placement Confirmation: CO2 detector,  positive ETCO2,  ETT inserted through vocal cords under direct vision and breath sounds checked- equal and bilateral Secured at: 23 cm Tube secured with: Tape Dental Injury: Teeth and Oropharynx as per pre-operative assessment    Anesthesia Procedure Note Anesthesia Procedure Image Right Brachial aline inserted by Dr. Krista Blue.   Anesthesia Procedure Image Right Brachial aline inserted by Dr. Krista Blue.   Anesthesia Procedure Image Anesthesia Procedure Image Anesthesia Procedure Image   The patient was identified and consent was given.  Hand hygiene and sterile gloves were used.  Following the time-out, the brachial  region was prepped and draped in a sterile fashion. The skin was anesthetized with local anesthesia.  The artery was located and a wire was inserted.  The skin was dilated and a 20cm, 20ga catheter was inserted.  The placement was confirmed by arterial flow.  The catheter was secured with a sterile dressing and sutured in place. The patient tolerated the procedure well. Start: 0850  End: 0901 J. Claybon Jabs, MD

## 2013-09-16 NOTE — Transfer of Care (Signed)
Immediate Anesthesia Transfer of Care Note  Patient: William Pacheco  Procedure(s) Performed: Procedure(s): PATCH ANGIOPLASTY OF LEFT BELOW THE KNEE POPLITEAL ARTERY (Left) INTRA OPERATIVE ARTERIOGRAM (Left) GROIN EXPLORATION (Left) ENDARTERECTOMYOF LEFT POPLITEAL ARTERY (Left)  Patient Location: PACU  Anesthesia Type:General  Level of Consciousness: awake, alert  and oriented  Airway & Oxygen Therapy: Patient Spontanous Breathing and Patient connected to nasal cannula oxygen  Post-op Assessment: Report given to PACU RN, Post -op Vital signs reviewed and stable and Patient moving all extremities X 4  Post vital signs: Reviewed and stable  Complications: No apparent anesthesia complications

## 2013-09-16 NOTE — Progress Notes (Signed)
MEDICATION RELATED NOTE   Pharmacy Consult for renally adjusted antibiotics  No Known Allergies  Vital Signs: Temp: 98.1 F (36.7 C) (03/18 1945) BP: 141/63 mmHg (03/18 1645) Pulse Rate: 57 (03/18 2015)  Labs:  Recent Labs  09/14/13 1007  WBC 5.2  HGB 14.2  HCT 41.5  PLT 181  APTT 31  CREATININE 0.72  ALBUMIN 3.4*  PROT 6.6  AST 36  ALT 35  ALKPHOS 50  BILITOT 0.8   The CrCl is unknown because both a height and weight (above a minimum accepted value) are required for this calculation.  Microbiology: Recent Results (from the past 720 hour(s))  SURGICAL PCR SCREEN     Status: Abnormal   Collection Time    09/14/13  9:53 AM      Result Value Ref Range Status   MRSA, PCR NEGATIVE  NEGATIVE Final   Staphylococcus aureus POSITIVE (*) NEGATIVE Final   Comment:            The Xpert SA Assay (FDA     approved for NASAL specimens     in patients over 61 years of age),     is one component of     a comprehensive surveillance     program.  Test performance has     been validated by [facility] for patients greater     than or equal to 61 year old.     It is not intended     to diagnose infection nor to     guide or monitor treatment.    Medical History: Past Medical History  Diagnosis Date  . CAD (coronary artery disease), native coronary artery September 2008    Left main 50-60%, LAD 80% with D2 involved. RCA mid occlusion --> CABG  . 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 Ischemic cardiomyopathy September 2008    EF = 35-40%; THE 45-50% IN 2013 --> ECHO 03/03/13 - EF 40-45% LV: Moderate HK of basal-midlateral & inferior myocardium; c/w prior infarction in the distribution of the RCA or LCx. Mild-Mod MR with Calcified annulus. Mod TR  . History of MI (myocardial infarction) 2012    Noted by echocardiography, and nuclear stress testing. Known RCA occlusion prior to CABG  . PAF (paroxysmal atrial fibrillation) - initial  presentation with RVR, and heart failure 2013    On a beta blocker, calcium channel blocker & Xarelto. Lexiscan Myoview 11/08/11 LV was enlarged with no evidence of ischemia. There was a moderate sized, moderate to severe in intensity fixed defect in the inferior wall suggestive of scar with no reversible ischemia. There was a basal septal akinesis w/severe global hypokinesis. EF = 31% ([facility])  . Chronic diastolic CHF (congestive heart failure), NYHA class 2 2009, 2011; 2013    Exacerbated by A. fib RVR, currently on Lasix.  . Peripheral arterial disease 2009, 2011; 2013    2009, 2011; 2013  . Renal artery stenosis, non-flow-limiting   . DM (diabetes mellitus), type 2 with neurological complications     Peripheral neuropathy  . Diabetic peripheral neuropathy associated with type 2 diabetes mellitus   . Hyperlipidemia LDL goal <70     On statin therapy  . Obesity (BMI 30.0-34.9)   . Hypertension associated with diabetes   . Erectile dysfunction associated with type 2 diabetes mellitus  2006  . Ischemic mitral regurgitation  1980s  Mild to moderate  . Thrombocytopenia      chronic  . Osteoarthritis of both knees 1990    Status post left knee a arthroscopy, currently Harding right knee  . History of hepatitis B 1980s  . Adopted     Adopted [V68.89]   Medications:  Anti-infectives   Start     Dose/Rate Route Frequency Ordered Stop   09/16/13 2130  cefUROXime (ZINACEF) 1.5 g in dextrose 5 % 50 mL IVPB     1.5 g 100 mL/hr over 30 Minutes Intravenous Every 12 hours 09/16/13 2047 09/17/13 2129   09/15/13 1434  cefUROXime (ZINACEF) 1.5 g in dextrose 5 % 50 mL IVPB     1.5 g 100 mL/hr over 30 Minutes Intravenous 30 min pre-op 09/15/13 1434 09/16/13 0857      Assessment: 63yo male who is s/p surgery for popliteal artery exploration.  He has been given IV Cefuroxime 1.5gm and tolerated this without noted adverse events.  He has this ordered for two more doses  for post-op prophylaxis.  His creatinine is 0.72 and his estimated crcl is > 50 ml/min.  Goal of Therapy:  Therapeutic response to IV antibiotics  Plan:  1.  Continue IV antibiotics as ordered 2.  Pharmacy to sign off - please re-consult should ongoing antibiotic needs change  Nadara Mustard, PharmD., MS Clinical Pharmacist Pager:  [phone] Thank you for allowing pharmacy to be part of this patients care team. 09/16/2013,9:10 PM

## 2013-09-17 LAB — BASIC METABOLIC PANEL
BUN: 12 mg/dL (ref 6–23)
CO2: 25 mEq/L (ref 19–32)
Calcium: 8.4 mg/dL (ref 8.4–10.5)
Chloride: 103 mEq/L (ref 96–112)
Creatinine, Ser: 0.74 mg/dL (ref 0.50–1.35)
Glucose, Bld: 102 mg/dL — ABNORMAL HIGH (ref 70–99)
POTASSIUM: 3.8 meq/L (ref 3.7–5.3)
SODIUM: 139 meq/L (ref 137–147)

## 2013-09-17 LAB — CBC
HCT: 37.1 % — ABNORMAL LOW (ref 39.0–52.0)
HEMOGLOBIN: 12.8 g/dL — AB (ref 13.0–17.0)
MCH: 29.7 pg (ref 26.0–34.0)
MCHC: 34.5 g/dL (ref 30.0–36.0)
MCV: 86.1 fL (ref 78.0–100.0)
Platelets: 160 10*3/uL (ref 150–400)
RBC: 4.31 MIL/uL (ref 4.22–5.81)
RDW: 13.8 % (ref 11.5–15.5)
WBC: 8.1 10*3/uL (ref 4.0–10.5)

## 2013-09-17 LAB — HEMOGLOBIN A1C
HEMOGLOBIN A1C: 10 % — AB (ref <5.7)
MEAN PLASMA GLUCOSE: 240 mg/dL — AB (ref <117)

## 2013-09-17 LAB — GLUCOSE, CAPILLARY
GLUCOSE-CAPILLARY: 204 mg/dL — AB (ref 70–99)
Glucose-Capillary: 125 mg/dL — ABNORMAL HIGH (ref 70–99)
Glucose-Capillary: 214 mg/dL — ABNORMAL HIGH (ref 70–99)
Glucose-Capillary: 139 mg/dL — ABNORMAL HIGH (ref 70–99)

## 2013-09-17 NOTE — Care Management Note (Signed)
    Page 1 of 1   09/17/2013     10:42:44 AM   CARE MANAGEMENT NOTE 09/17/2013  Patient:  William Pacheco, William Pacheco   Account Number:  [id number]  Date Initiated:  09/17/2013  Documentation initiated by:  Donn Pierini  Subjective/Objective Assessment:   Pt admitted s/p popliteal artery endarterectomy     Action/Plan:   PTA pt lived at home with spouse- PT/OT evals ordered and pending- NCM to follow for recommendations   Anticipated DC Date:  09/19/2013   Anticipated DC Plan:        DC Planning Services  CM consult      Choice offered to / List presented to:             Status of service:  In process, will continue to follow Medicare Important Message given?   (If response is "NO", the following Medicare IM given date fields will be blank) Date Medicare IM given:   Date Additional Medicare IM given:    Discharge Disposition:    Per UR Regulation:  Reviewed for med. necessity/level of care/duration of stay  If discussed at Long Length of Stay Meetings, dates discussed:    Comments:

## 2013-09-17 NOTE — Anesthesia Postprocedure Evaluation (Signed)
Anesthesia Post Note  Patient: William Pacheco  Procedure(s) Performed: Procedure(s) (LRB): PATCH ANGIOPLASTY OF LEFT BELOW THE KNEE POPLITEAL ARTERY (Left) INTRA OPERATIVE ARTERIOGRAM (Left) GROIN EXPLORATION (Left) ENDARTERECTOMYOF LEFT POPLITEAL ARTERY (Left)  Anesthesia type: general  Patient location: PACU  Post pain: Pain level controlled  Post assessment: Patient's Cardiovascular Status Stable Post vital signs: Reviewed and stable  Level of consciousness: sedated  Complications: No apparent anesthesia complications

## 2013-09-17 NOTE — Progress Notes (Signed)
Inpatient Diabetes Program Recommendations  AACE/ADA: New Consensus Statement on Inpatient Glycemic Control (2013)  Target Ranges:  Prepandial:   less than 140 mg/dL      Peak postprandial:   less than 180 mg/dL (1-2 hours)      Critically ill patients:  140 - 180 mg/dL     **Received referral for this pt.  Spoke to patient about his current A1c of 10%.  Explained what an A1c is and what it measures.  Reminded patient that his goal A1c is 7% or less per ADA standards to prevent both acute and long-term complications.  Encouraged patient to check his CBGs at least bid at home (fasting and another check within the day) and to record all CBGs in a logbook for his PCP to review.  **Discussed appropriate DM diet with pt.  Encouraged pt to pay attention to serving sizes and provided pt with basic food label reading information.  Also discussed with pt the possibility that he may need rapid-acting insulin with meals at home.  Pt told me he used to take Humalog SSI with supper, but that he got frustrated and quit taking the Humalog.  Pt told me he did not take his insulin consistently for several months.  Pt also told me he has many stressors in his life that may be contributing to poor blood sugar control at home.  Encouraged pt to follow up with an Endocrinologist here in [place] for more intensive DM management.  Gave pt the names of several local endocrinologists and encouraged pt to research these physicians if he would like more specialized DM care.   Will follow. Ambrose Finland RN, MSN, CDE Diabetes Coordinator Inpatient Diabetes Program Team Pager: [phone] (8a-10p)

## 2013-09-17 NOTE — Progress Notes (Signed)
Utilization review completed.  

## 2013-09-17 NOTE — Evaluation (Signed)
Physical Therapy Evaluation Patient Details Name: William Pacheco MRN: [id number] DOB: 21-Feb-1951 Today's Date: 09/17/2013 Time: 2993-7169 PT Time Calculation (min): 21 min  PT Assessment / Plan / Recommendation History of Present Illness  s/p Left below-the-knee popliteal artery endarterectomy with bovine patch angioplasty  Clinical Impression  Pt adm with the above dx. Pt's functional mobility is limited to pain in LLE but pt able to perform transfers in safe manner with min guard A on this date. Pt plans to return home where his wife is available for supervision and assistance as needed. Pt to benefit from skilled acute PT to address deficits listed below for a safe return home.     PT Assessment  Patient needs continued PT services    Follow Up Recommendations  Supervision/Assistance - 24 hour    Does the patient have the potential to tolerate intense rehabilitation      Barriers to Discharge        Equipment Recommendations  Rolling walker with 5" wheels    Recommendations for Other Services     Frequency Min 3X/week    Precautions / Restrictions Precautions Precautions: Fall Restrictions Weight Bearing Restrictions: Yes Other Position/Activity Restrictions: L LE WBAT   Pertinent Vitals/Pain Pt reports dec L LE pain during amb      Mobility  Bed Mobility Overal bed mobility: Needs Assistance Bed Mobility: Supine to Sit Supine to sit: Supervision General bed mobility comments: pt used bed rails to pull self up. pt able to move Bilat LE EOB Transfers Overall transfer level: Needs assistance Equipment used: Rolling walker (2 wheeled) Transfers: Sit to/from Stand Sit to Stand: Min assist General transfer comment: min A to help pt collect balance in standing. pt req verbal cues for hand placement and RW safety Ambulation/Gait Ambulation/Gait assistance: Min guard Ambulation Distance (Feet): 300 Feet Assistive device: Rolling walker (2 wheeled) Gait  Pattern/deviations: Step-to pattern;Step-through pattern;Decreased stride length Gait velocity: slow and guarded for falls General Gait Details: pt alternated between step to and step through gait patterns. pt reports LLE pain dec with continued amb.    Exercises General Exercises - Lower Extremity Ankle Circles/Pumps: AROM;Strengthening;10 reps;Seated;Supine Long [employer]: AROM;Strengthening;10 reps;Seated   PT Diagnosis: Difficulty walking  PT Problem List: Decreased strength;Decreased activity tolerance;Decreased balance;Decreased mobility;Decreased knowledge of use of DME PT Treatment Interventions: DME instruction;Gait training;Stair training;Functional mobility training;Therapeutic activities;Balance training;Therapeutic exercise;Patient/family education     PT Goals(Current goals can be found in the care plan section) Acute Rehab PT Goals Patient Stated Goal: return home PT Goal Formulation: With patient Time For Goal Achievement: 09/24/13 Potential to Achieve Goals: Good  Visit Information  Last PT Received On: 09/17/13 Assistance Needed: +1 History of Present Illness: s/p Left below-the-knee popliteal artery endarterectomy with bovine patch angioplasty       Prior Functioning  Home Living Family/patient expects to be discharged to:: Private residence Living Arrangements: Spouse/significant other Available Help at Discharge: Family Type of Home: House Home Access: Stairs to enter Secretary/administrator of Steps: 2 Entrance Stairs-Rails: None Home Layout: One level Additional Comments: split level home but pt hardly goes downstairs Prior Function Level of Independence: Independent Comments: pt independent all ADLs however limited to participation due to LLE pain Communication Communication: No difficulties    Cognition  Cognition Arousal/Alertness: Awake/alert Behavior During Therapy: WFL for tasks assessed/performed Overall Cognitive Status: Within Functional  Limits for tasks assessed    Extremity/Trunk Assessment Upper Extremity Assessment Upper Extremity Assessment: Overall WFL for tasks assessed Lower Extremity Assessment Lower  Extremity Assessment: LLE deficits/detail LLE Deficits / Details: WBAT. pain and weakness due to sx   Balance Balance Overall balance assessment: Needs assistance Sitting-balance support: Feet supported;Single extremity supported Sitting balance-Leahy Scale: Fair Sitting balance - Comments: pt able to sit EOB for 1 min without UE support prior to amb Standing balance support: Bilateral upper extremity supported Standing balance-Leahy Scale: Fair Standing balance comment: pt able to stand without UE support for one min before sitting down in chair General Comments General comments (skin integrity, edema, etc.): pt reports L heel sensitivity  End of Session PT - End of Session Equipment Utilized During Treatment: Gait belt (RW) Activity Tolerance: Patient tolerated treatment well Patient left: in chair;with call bell/phone within reach Nurse Communication: Mobility status  GP     Chablis Losh SPT 09/17/2013, 12:41 PM

## 2013-09-17 NOTE — Progress Notes (Addendum)
1420: Attempted to call report to 2W RN.    All belongings sent with patient. Attempted to notify wife of transfer, did not leave message on vm. VSS. Pt transferred via wheelchair by NT on tele. eICU and CCMT notified. DM coordinator consulted for education.  Wife called back and now aware of transfer.

## 2013-09-17 NOTE — Evaluation (Signed)
Physical Therapy Evaluation Patient Details Name: William Pacheco MRN: [id number] DOB: [birth year] Today's Date: 09/17/2013 Time: 4562-5638 PT Time Calculation (min): 21 min  PT Assessment / Plan / Recommendation History of Present Illness  s/p Left below-the-knee popliteal artery endarterectomy with bovine patch angioplasty  Clinical Impression  Pt adm with the above dx. Pt's functional mobility is limited to pain in LLE but pt able to perform transfers in safe manner with min guard A on this date. Pt plans to return home where his wife is available for supervision and assistance as needed. Pt to benefit from skilled acute PT to address deficits listed below for a safe return home.     PT Assessment  Patient needs continued PT services    Follow Up Recommendations  Supervision/Assistance - 24 hour;No PT follow up    Does the patient have the potential to tolerate intense rehabilitation      Barriers to Discharge        Equipment Recommendations  Rolling walker with 5" wheels    Recommendations for Other Services OT consult   Frequency Min 3X/week    Precautions / Restrictions Precautions Precautions: Fall Restrictions Weight Bearing Restrictions: Yes Other Position/Activity Restrictions: L LE WBAT   Pertinent Vitals/Pain Pt reports dec L LE pain during amb      Mobility  Bed Mobility Overal bed mobility: Needs Assistance Bed Mobility: Supine to Sit Supine to sit: Supervision General bed mobility comments: pt used bed rails to pull self up. pt able to move Bilat LE EOB Transfers Overall transfer level: Needs assistance Equipment used: Rolling walker (2 wheeled) Transfers: Sit to/from Stand Sit to Stand: Min assist General transfer comment: min A to help pt collect balance in standing. pt req verbal cues for hand placement and RW safety Ambulation/Gait Ambulation/Gait assistance: Min guard Ambulation Distance (Feet): 300 Feet Assistive device: Rolling walker (2  wheeled) Gait Pattern/deviations: Step-to pattern;Step-through pattern;Decreased stride length Gait velocity: slow and guarded for falls Gait velocity interpretation: Below normal speed for age/gender General Gait Details: pt alternated between step to and step through gait patterns. pt reports LLE pain dec with continued amb. cues for proper gt sequencing and upright posture     Exercises General Exercises - Lower Extremity Ankle Circles/Pumps: AROM;Strengthening;10 reps;Seated;Supine Long [employer]: AROM;Strengthening;10 reps;Seated   PT Diagnosis: Difficulty walking  PT Problem List: Decreased strength;Decreased activity tolerance;Decreased balance;Decreased mobility;Decreased knowledge of use of DME PT Treatment Interventions: DME instruction;Gait training;Stair training;Functional mobility training;Therapeutic activities;Balance training;Therapeutic exercise;Patient/family education;Neuromuscular re-education     PT Goals(Current goals can be found in the care plan section) Acute Rehab PT Goals Patient Stated Goal: return home PT Goal Formulation: With patient Time For Goal Achievement: 09/24/13 Potential to Achieve Goals: Good  Visit Information  Last PT Received On: 09/17/13 Assistance Needed: +1 History of Present Illness: s/p Left below-the-knee popliteal artery endarterectomy with bovine patch angioplasty       Prior Functioning  Home Living Family/patient expects to be discharged to:: Private residence Living Arrangements: Spouse/significant other Available Help at Discharge: Family Type of Home: House Home Access: Stairs to enter Secretary/administrator of Steps: 2 Entrance Stairs-Rails: None Home Layout: Multi-level;Able to live on main level with bedroom/bathroom Home Equipment: None Additional Comments: split level home but pt hardly goes downstairs Prior Function Level of Independence: Independent Comments: pt independent all ADLs however limited to  participation due to LLE pain Communication Communication: No difficulties    Cognition  Cognition Arousal/Alertness: Awake/alert Behavior During Therapy: [facility] for tasks  assessed/performed Overall Cognitive Status: Within Functional Limits for tasks assessed    Extremity/Trunk Assessment Upper Extremity Assessment Upper Extremity Assessment: Overall WFL for tasks assessed Lower Extremity Assessment Lower Extremity Assessment: LLE deficits/detail LLE Deficits / Details: WBAT. pain and weakness due to sx LLE Sensation: decreased light touch Cervical / Trunk Assessment Cervical / Trunk Assessment: Normal   Balance Balance Overall balance assessment: Needs assistance Sitting-balance support: Feet supported;No upper extremity supported Sitting balance-Leahy Scale: Fair Sitting balance - Comments: pt able to sit EOB for 1 min without UE support prior to amb Standing balance support: Bilateral upper extremity supported;During functional activity Standing balance-Leahy Scale: Poor Standing balance comment: UE supported by RW  General Comments General comments (skin integrity, edema, etc.): pt reports L heel sensitivity  End of Session PT - End of Session Equipment Utilized During Treatment: Gait belt (RW) Activity Tolerance: Patient tolerated treatment well Patient left: in chair;with call bell/phone within reach Nurse Communication: Mobility status  GP    Malachi ParadiseKohler, Andrew SPT [place], [place] PT [phone] 09/17/2013, 1:42 PM

## 2013-09-17 NOTE — Progress Notes (Addendum)
[facility] [place]  Subjective  - Pain in the left leg.     Objective 153/71 82 99.1 F (37.3 C) (Oral) 21 96%  Intake/Output Summary (Last 24 hours) at 09/17/13 0942 Last data filed at 09/17/13 0400  Gross per 24 hour  Intake    550 ml  Output   1410 ml  Net   -860 ml    Doppler DP PT faint Incision dressing clean and dry Groin soft no hematomas    Assessment/Planning: POD #1  PROCEDURE:  1. Left groin, above-the-knee and below-the-knee popliteal artery exploration 2. Left below-the-knee popliteal artery endarterectomy with bovine patch angioplasty 3. Open cannulation of left common femoral artery  4. Left leg runoff Transfer to 2W Encourage ambulation will change dressing tomorrow   Clinton Gallant [facility] 09/17/2013 9:42 AM --  Laboratory Lab Results:  Recent Labs  09/14/13 1007 09/17/13 0410  WBC 5.2 8.1  HGB 14.2 12.8*  HCT 41.5 37.1*  PLT 181 160   BMET  Recent Labs  09/14/13 1007 09/17/13 0410  NA 134* 139  K 5.0 3.8  CL 100 103  CO2 24 25  GLUCOSE 238* 102*  BUN 19 12  CREATININE 0.72 0.74  CALCIUM 8.9 8.4    COAG Lab Results  Component Value Date   INR 1.01 09/14/2013   INR 0.97 09/07/2013   INR 1.12 07/27/2013   No results found for this basename: PTT   Addendum  I have independently interviewed and examined the patient, and I agree with the physician assistant's findings.  Strong AT signal.  No active bleeding from any incision bandage.  No ulcers or gangrene in L foot.  Ok to transfer to floor.  D/C MIVF & Foley.  OOB ambulating.  Possible home tomorrow.  Restart antiplatelet and Xarelto tomorrow.  1.  Poorly controlled diabetes with complications including neuropathy and peripheral arterial disease: draw new HgA1c tomorrow.   BG 102-238.  Cont SSI and home regimen.  Diabetes nurse consulted.  Pt will need outpatient referral to Nutrition.   2.  Lifestyle limiting intermittent claudication:  Distal  target too calcified to complete the bypass.  Will need to focus on medical management of this patient's disease.  3.  CAD s/p distant CABG (2008), history of heart failure: no cardiac events post-op currently.  Restart Xarelto tomorrow.  Suspect patient was going to be scheduled for outpatient cardiac rehab also.  4.  H/o Atrial fibrillation: NSR currently, cont. Cardiac regimen  5.  Anemia due to expected intraoperative hemorrhage: check H/H tomorrow.  If ok, restart Xarelto   [name], MD [facility] [place] Office: [phone] Pager: [phone]  09/17/2013, 11:21 AM

## 2013-09-18 ENCOUNTER — Encounter (HOSPITAL_COMMUNITY): Payer: Self-pay | Admitting: Vascular Surgery

## 2013-09-18 DIAGNOSIS — Z48812 Encounter for surgical aftercare following surgery on the circulatory system: Secondary | ICD-10-CM

## 2013-09-18 LAB — CBC
HEMATOCRIT: 40.3 % (ref 39.0–52.0)
HEMOGLOBIN: 13.5 g/dL (ref 13.0–17.0)
MCH: 29.2 pg (ref 26.0–34.0)
MCHC: 33.5 g/dL (ref 30.0–36.0)
MCV: 87 fL (ref 78.0–100.0)
Platelets: 165 10*3/uL (ref 150–400)
RBC: 4.63 MIL/uL (ref 4.22–5.81)
RDW: 14.1 % (ref 11.5–15.5)
WBC: 8.1 10*3/uL (ref 4.0–10.5)

## 2013-09-18 LAB — GLUCOSE, CAPILLARY
GLUCOSE-CAPILLARY: 168 mg/dL — AB (ref 70–99)
GLUCOSE-CAPILLARY: 174 mg/dL — AB (ref 70–99)
Glucose-Capillary: 138 mg/dL — ABNORMAL HIGH (ref 70–99)
Glucose-Capillary: 180 mg/dL — ABNORMAL HIGH (ref 70–99)
Glucose-Capillary: 156 mg/dL — ABNORMAL HIGH (ref 70–99)

## 2013-09-18 MED ORDER — TRAMADOL HCL 50 MG PO TABS: 50.0000 mg | ORAL_TABLET | Freq: Four times a day (QID) | ORAL | Status: DC | PRN

## 2013-09-18 NOTE — Progress Notes (Signed)
VASCULAR LAB PRELIMINARY  ARTERIAL  ABI completed:    RIGHT    LEFT    PRESSURE WAVEFORM  PRESSURE WAVEFORM  BRACHIAL 130 Tri BRACHIAL 141 Tri  DP   DP    AT 125 Mono AT 54 Damp mono  PT 114 Mono PT 61 Damp mono  PER   PER    GREAT TOE  NA GREAT TOE  NA    RIGHT LEFT  ABI 0.89 0.43     Farrel Demark, RDMS, RVT  09/18/2013, 9:09 AM

## 2013-09-18 NOTE — Progress Notes (Addendum)
[facility] [place]  Subjective  - Pain at incision site.     Objective 136/69 78 98 F (36.7 C) (Oral) 18 100%  Intake/Output Summary (Last 24 hours) at 09/18/13 0944 Last data filed at 09/17/13 1800  Gross per 24 hour  Intake    490 ml  Output    850 ml  Net   -360 ml   Doppler DP/PT left Incisions healing well  ABI Right 0.89, left 0.43   Assessment/Planning: POD #2 1. Left groin, above-the-knee and below-the-knee popliteal artery exploration 2. Left below-the-knee popliteal artery endarterectomy with bovine patch angioplasty 3. Open cannulation of left common femoral artery  4. Left leg runoff D/C home No Home PT needed per PT eval.  Will order rolling walker for home. Diabetes Coordinator Inpatient Diabetes Program Recommendations Discussed appropriate DM diet with pt. Encouraged pt to pay attention to serving sizes and provided pt with basic food label reading information. Also discussed with pt the possibility that he may need rapid-acting insulin with meals at home. Pt told me he used to take Humalog SSI with supper, but that he got frustrated and quit taking the Humalog. Pt told me he did not take his insulin consistently for several months. Pt also told me he has many stressors in his life that may be contributing to poor blood sugar control at home. Encouraged pt to follow up with an Endocrinologist here in [place] for more intensive DM management. Gave pt the names of several local endocrinologists and encouraged pt to research these physicians if he would like more specialized DM care.       Clinton Gallant [facility] 09/18/2013 9:44 AM --  Laboratory Lab Results:  Recent Labs  09/17/13 0410 09/18/13 0500  WBC 8.1 8.1  HGB 12.8* 13.5  HCT 37.1* 40.3  PLT 160 165   BMET  Recent Labs  09/17/13 0410  NA 139  K 3.8  CL 103  CO2 25  GLUCOSE 102*  BUN 12  CREATININE 0.74  CALCIUM 8.4    COAG Lab Results  Component  Value Date   INR 1.01 09/14/2013   INR 0.97 09/07/2013   INR 1.12 07/27/2013   No results found for this basename: PTT   Addendum  I have independently interviewed and examined the patient, and I agree with the physician assistant's findings.  No bleeding at any of the incisions.  H/H good.  Restart Xarelto.  ABI unchanged as fem-pop BPG could not be completed.  Maximal medical mgmt.  HgA1c 10.0 indicating poor control of his DM, so further titration of his diabetic regimen is needed.  Ok to D/C pt.  [name], MD [facility] [place] Office: [phone] Pager: [phone]  09/18/2013, [phone]

## 2013-09-18 NOTE — Progress Notes (Signed)
Pt dc'd home with wife and daughter.

## 2013-09-18 NOTE — Progress Notes (Signed)
Physical Therapy Treatment Patient Details Name: William Pacheco G Arzuaga MRN: [id number] DOB: 19-Oct-1950 Today's Date: 09/18/2013    History of Present Illness s/p Left below-the-knee popliteal artery endarterectomy with bovine patch angioplasty    PT Comments    Pt with many questions re: recovery and expected progression. All answered related to mobility and notified OT of his desire to review how to manage lower body dressing. Pt repeatedly educated that he needs to continue walking and doing ROM exercises to improve function of LLE. Pt completed stair training and from a PT perspective is fine to d/c home today.   Follow Up Recommendations  No PT follow up;Supervision for mobility/OOB     Equipment Recommendations  None recommended by PT (pt now reports he has a RW at home)    Recommendations for Other Services      Precautions / Restrictions Precautions Precautions: None Restrictions Other Position/Activity Restrictions: L LE WBAT    Mobility  Bed Mobility Overal bed mobility: Modified Independent Bed Mobility: Sit to Supine     Supine to sit: Modified independent (Device/Increase time)        Transfers Overall transfer level: Needs assistance Equipment used: Rolling walker (2 wheeled) Transfers: Sit to/from Stand Sit to Stand: Supervision         General transfer comment: vc for use of RW  Ambulation/Gait Ambulation/Gait assistance: Supervision Ambulation Distance (Feet): 300 Feet Assistive device: Rolling walker (2 wheeled);None Gait Pattern/deviations: Step-through pattern;Antalgic   Gait velocity interpretation: Below normal speed for age/gender General Gait Details: pt reports incr pain with initial ambulation; cued to attempt step-to pattern with RLE never passing LLE until his muscles/tissue get "warmed up." Pt unable to maintain step-to "(right foot) just keeps passing my left!" On return to room, pt wanted to try walking without RW due to reports that he  does not have much room to use RW in his home. He walked with minimal antalgic gait and no unsteadiness without device.   Stairs Stairs: Yes Stairs assistance: Min assist Stair Management: One rail Right;Step to pattern;Forwards Number of Stairs: 2 General stair comments: vc for sequence/technique; pt does not have a rail, but insists he can hold onto a wall on his Rt (when ascending). He preferred to use the rail here, since he had it, with HHA on opposite side  Wheelchair Mobility    Modified Rankin (Stroke Patients Only)       Balance                                    Cognition Arousal/Alertness: Awake/alert Behavior During Therapy: WFL for tasks assessed/performed Overall Cognitive Status: Within Functional Limits for tasks assessed                      Exercises General Exercises - Lower Extremity Ankle Circles/Pumps: AROM;Both;20 reps;Supine Quad Sets: AROM;Left;10 reps;Supine Heel Slides: AROM;Left;5 reps;Supine;Limitations Heel Slides Limitations: instructed to keep Lt heel elevated off surface due to pt's report of wound (crack) on the posterior heel Other Exercises Other Exercises: Pt concerned over lack of mobility in LLE and emphasized need to incr activity (both walking and ROM exercises). Handout provided.  General Comments General comments (skin integrity, edema, etc.): pt with multiple questions/concerns re: how he will dress his lower body. Noted OT is scheduled to see pt today and informed them of pt's inquiries.       Pertinent Vitals/Pain 5/10  LLE; pt pre-medicated for pain ~ 1 hour prior to session    Home Living                      Prior Function            PT Goals (current goals can now be found in the care plan section) Progress towards PT goals: Progressing toward goals    Frequency  Min 3X/week    PT Plan Current plan remains appropriate    End of Session   Activity Tolerance: Patient tolerated  treatment well Patient left: in bed;with call bell/phone within reach     Time: 2633-3545 PT Time Calculation (min): 40 min  Charges:  $Gait Training: 23-37 mins $Therapeutic Exercise: 8-22 mins                    G Codes:      TRUE Shackleford 2013-10-07, 9:56 AM Pager [phone]

## 2013-09-18 NOTE — Progress Notes (Signed)
Discharge instructions reviewed with pt. Pt verb understanding. He is waiting for his wife to pick him up. We discussed his blood sugar with goals and some interventions to keep it under control.

## 2013-09-18 NOTE — Evaluation (Signed)
Occupational Therapy Evaluation and Discharge Patient Details Name: William Pacheco MRN: [id number] DOB: 1950/08/25 Today's Date: 09/18/2013 Time: 2297-9892 OT Time Calculation (min): 23 min  OT Assessment / Plan / Recommendation History of present illness s/p Left below-the-knee popliteal artery endarterectomy with bovine patch angioplasty   Clinical Impression   This 63 yo male admitted and underwent above presents to acute OT with all education completed. No further OT needs, will sign off.    OT Assessment  Patient does not need any further OT services    Follow Up Recommendations  No OT follow up       Equipment Recommendations  None recommended by OT          Precautions / Restrictions Precautions Precautions: None Restrictions Weight Bearing Restrictions: Yes Other Position/Activity Restrictions: L LE WBAT   Pertinent Vitals/Pain Pre-medicated per pt, but still hurts really bad when he has been off of it for a while and then gets back up on it.    ADL  Eating/Feeding: Independent Where Assessed - Eating/Feeding: Chair Grooming: Independent Where Assessed - Grooming: Unsupported standing Upper Body Bathing: Independent Where Assessed - Upper Body Bathing: Unsupported standing Lower Body Bathing: Minimal assistance Where Assessed - Lower Body Bathing: Unsupported sit to stand Upper Body Dressing: Independent Where Assessed - Upper Body Dressing: Unsupported sit to stand Lower Body Dressing: Minimal assistance Where Assessed - Lower Body Dressing: Unsupported sit to stand Toilet Transfer: Modified independent Toilet Transfer Method: Sit to stand Toilet Transfer Equipment: Regular height toilet;Grab bars Toileting - Clothing Manipulation and Hygiene: Independent Where Assessed - Engineer, mining and Hygiene: Sit to stand from 3-in-1 or toilet Tub/Shower Transfer:  (Pt states that he will wait on getting into tub until his LLE is moving better and  less pain) Equipment Used:  (started out with RW, but pt states he does better without it, so the rest of session without RW) Transfers/Ambulation Related to ADLs: Mod I (increased time without RW) ADL Comments: Showed pt pictures of AE and how they would help him, also gave him the option of me getting them and letting him practice with them. He declined for now saying that hopefully he can figure things out in the short run since these issues should not be long standing. Also had him practice with 3n1 over toliet since he states he has been pulling up on grab bar in bathroom here and will not really have that as an option at home. He is not in favor of 3n1 after trying it since it does not work for men very well (I agree)     Acute Rehab OT Goals Patient Stated Goal: return home  Visit Information  Last OT Received On: 09/18/13 Assistance Needed: +1 History of Present Illness: s/p Left below-the-knee popliteal artery endarterectomy with bovine patch angioplasty       Prior Functioning     Home Living Family/patient expects to be discharged to:: Private residence Living Arrangements: Spouse/significant other Available Help at Discharge: Family;Available PRN/intermittently Type of Home: House Home Access: Stairs to enter [employer] of Steps: 2 Entrance Stairs-Rails: None Home Layout: Multi-level;Able to live on main level with bedroom/bathroom Home Equipment: Walker - 2 wheels Additional Comments: split level home but pt hardly goes downstairs Prior Function Level of Independence: Independent Comments: pt independent all ADLs however limited to participation due to LLE pain Communication Communication: No difficulties Dominant Hand: Right         Vision/Perception Vision - History Patient Visual Report:  No change from baseline   Cognition  Cognition Arousal/Alertness: Awake/alert Behavior During Therapy: WFL for tasks assessed/performed Overall Cognitive  Status: Within Functional Limits for tasks assessed    Extremity/Trunk Assessment Upper Extremity Assessment Upper Extremity Assessment: Overall WFL for tasks assessed     Mobility General bed mobility comments: Pt up in recliner upon arrival with LLE propped up on pillow. Transfers Overall transfer level: Modified independent Equipment used: None Transfers: Sit to/from Stand Sit to Stand: Mod I          End of Session OT - End of Session Equipment Utilized During Treatment:  (NOne) Activity Tolerance: Patient tolerated treatment well Patient left: in chair;with call bell/phone within reach       Evette GeorgesLeonard, William Pacheco [phone] 09/18/2013, 1:31 PM

## 2013-09-21 ENCOUNTER — Telehealth: Payer: Self-pay | Admitting: Vascular Surgery

## 2013-09-21 NOTE — Telephone Encounter (Addendum)
Message copied by Fredrich Birks on Mon Sep 21, 2013  3:47 PM ------      Message from: Lorin Mercy K      Created: Mon Sep 21, 2013 11:30 AM      Regarding: RE: Schedule       Do 4 weeks,, if he has any issues we can bring him then.       ----- Message -----         From: Fredrich Birks         Sent: 09/21/2013  10:11 AM           To: Sharee Pimple, CMA      Subject: RE: Schedule                                             Dr Imogene Burn is out in 2 and 3 weeks. Should this patient follow up in 4 weeks or see Rosalita Chessman?            Thanks,      Annabelle Harman      ----- Message -----         From: Sharee Pimple, CMA         Sent: 09/18/2013  10:19 AM           To: [facility] Admin Pool      Subject: Schedule                                                             ----- Message -----         From: Lars Mage, PA-C         Sent: 09/18/2013   9:55 AM           To: [facility] Charge Pool            F/U in the office in 3 weeks with Dr. Imogene Burn s/p post attempted fem-pop by pass with no distal anastomosis point large enough to accept great.  Aborted fem-pop.             ------  09/21/13: spoke with pt, dpm

## 2013-09-25 ENCOUNTER — Encounter: Payer: No Typology Code available for payment source | Admitting: Vascular Surgery

## 2013-09-25 ENCOUNTER — Encounter (HOSPITAL_COMMUNITY): Payer: No Typology Code available for payment source

## 2013-09-28 NOTE — Discharge Summary (Signed)
[facility] Discharge Summary   Patient ID:  William Pacheco MRN: [id number] DOB/AGE: Jul 17, 1950 63 y.o.  Admit date: 09/16/2013 Discharge date: 09/28/2013 Date of Surgery: 09/16/2013 Surgeon: Surgeon(s): Fransisco Hertz, MD  Admission Diagnosis: Peripheral Vascular Disease with Claudication  Discharge Diagnoses:  Peripheral Vascular Disease with Claudication  Secondary Diagnoses: Past Medical History  Diagnosis Date  . CAD (coronary artery disease), native coronary artery September 2008    Left main 50-60%, LAD 80% with D2 involved. RCA mid occlusion --> CABG  . 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 Ischemic cardiomyopathy September 2008    EF = 35-40%; THE 45-50% IN 2013 --> ECHO 03/03/13 - EF 40-45% LV: Moderate HK of basal-midlateral & inferior myocardium; c/w prior infarction in the distribution of the RCA or LCx. Mild-Mod MR with Calcified annulus. Mod TR  . History of MI (myocardial infarction) 2012    Noted by echocardiography, and nuclear stress testing. Known RCA occlusion prior to CABG  . PAF (paroxysmal atrial fibrillation) - initial presentation with RVR, and heart failure 2013    On a beta blocker, calcium channel blocker & Xarelto. Lexiscan Myoview 11/08/11 LV was enlarged with no evidence of ischemia. There was a moderate sized, moderate to severe in intensity fixed defect in the inferior wall suggestive of scar with no reversible ischemia. There was a basal septal akinesis w/severe global hypokinesis. EF = 31% (Which is different from the [facility] EF)  . Chronic diastolic CHF (congestive heart failure), NYHA class 2 2009, 2011; 2013    Exacerbated by A. fib RVR, currently on Lasix.  . Peripheral arterial disease 2009, 2011; 2013    2009, 2011; 2013  . Renal artery stenosis, non-flow-limiting   . DM (diabetes mellitus), type 2 with neurological complications     Peripheral neuropathy  . Diabetic peripheral  neuropathy associated with type 2 diabetes mellitus   . Hyperlipidemia LDL goal <70     On statin therapy  . Obesity (BMI 30.0-34.9)   . Hypertension associated with diabetes   . Erectile dysfunction associated with type 2 diabetes mellitus  2006  . Ischemic mitral regurgitation  1980s    Mild to moderate  . Thrombocytopenia      chronic  . Osteoarthritis of both knees 1990    Status post left knee a arthroscopy, currently [place] right knee  . History of hepatitis B 1980s  . Adopted     Adopted [V68.89]    Procedure(s): PATCH ANGIOPLASTY OF LEFT BELOW THE KNEE POPLITEAL ARTERY INTRA OPERATIVE ARTERIOGRAM GROIN EXPLORATION ENDARTERECTOMYOF LEFT POPLITEAL ARTERY  Discharged Condition: good  HPI: 62 y/o male with symptoms of claudication in the left LE.  This is this patient's 5+ procedure on the left femoropopliteal artery. I had discussed with the patient that the stent likely failed due to interval progression of popliteal arterial disease at the level of the knee. I have recommended left common femoral artery to below-the-popliteal artery bypass. Unfortunately, his greater saphenous vein has been mostly harvested in both legs, so his only option is a bypass with Propaten.  He underwent surgey by Dr. Imogene Burn on 09/16/2013.  PROCEDURE:  1. Left groin, above-the-knee and below-the-knee popliteal artery exploration 2. Left below-the-knee popliteal artery endarterectomy with bovine patch angioplasty 3. Open cannulation of left common femoral artery  4. Left leg runoff This patient does not have critical limb ischemia, so a fem-tib bypass with prosthetic graft cannot be justified  as this is unlikely to have meaningful patency and will result in limb ischemia once the bypass fails.  He will do a walking program during and after his surgery recovery stage.  No bleeding at any of the incisions. H/H good. Restart Xarelto. ABI unchanged as fem-pop BPG could not be completed. Maximal medical mgmt.  HgA1c 10.0 indicating poor control of his DM, so further titration of his diabetic regimen is needed. Ok to D/C pt. D/C home today.  F/U in the office in 4 weeks.    Hospital Course:  William Pacheco is a 63 y.o. male is S/P Left Procedure(s): PATCH ANGIOPLASTY OF LEFT BELOW THE KNEE POPLITEAL ARTERY INTRA OPERATIVE ARTERIOGRAM GROIN EXPLORATION ENDARTERECTOMYOF LEFT POPLITEAL ARTERY Extubated: POD # 0 Physical exam: Doppler DP/PT left  Incisions healing well     Post-op wounds healing well Pt. Ambulating, voiding and taking PO diet without difficulty. Pt pain controlled with PO pain meds. Labs as below Complications:See HPI  Consults:     Significant Diagnostic Studies: CBC Lab Results  Component Value Date   WBC 8.1 09/18/2013   HGB 13.5 09/18/2013   HCT 40.3 09/18/2013   MCV 87.0 09/18/2013   PLT 165 09/18/2013    BMET    Component Value Date/Time   NA 139 09/17/2013 0410   K 3.8 09/17/2013 0410   CL 103 09/17/2013 0410   CO2 25 09/17/2013 0410   GLUCOSE 102* 09/17/2013 0410   BUN 12 09/17/2013 0410   CREATININE 0.74 09/17/2013 0410   CALCIUM 8.4 09/17/2013 0410   GFRNONAA >90 09/17/2013 0410   GFRAA >90 09/17/2013 0410   COAG Lab Results  Component Value Date   INR 1.01 09/14/2013   INR 0.97 09/07/2013   INR 1.12 07/27/2013     Disposition:  Discharge to :Home Discharge Orders   Future Appointments Provider Department Dept Phone   10/16/2013 3:15 PM Fransisco HertzBrian L Domingue Coltrain, MD [facility] [phone]   Future Orders Complete By Expires   Ambulatory referral to Nutrition and Diabetic Education  As directed    Comments:     A1c 10% (09/17/13).  Admitted for vascular surgery.  Needs better control of CBGs at home.  Takes Lantus 45 units QHS at home.   Call MD for:  redness, tenderness, or signs of infection (pain, swelling, bleeding, redness, odor or green/yellow discharge around incision site)  As directed    Call MD for:  severe or increased  pain, loss or decreased feeling  in affected limb(s)  As directed    Call MD for:  temperature >100.5  As directed    Discharge instructions  As directed    Comments:     May shower after showing keep grin incisions clean and dry   Driving Restrictions  As directed    Comments:     No driving for 2 weeks   Increase activity slowly  As directed    Comments:     Walk with assistance use walker or cane as needed   Lifting restrictions  As directed    Comments:     No lifting for 6 weeks   Resume previous diet  As directed        Medication List         aspirin 81 MG tablet  Take 81 mg by mouth daily.     atorvastatin 20 MG tablet  Commonly known as:  LIPITOR  Take 20 mg by mouth daily at 6 PM.  CALCIUM 1000 + D PO  Take 2 tablets by mouth daily.     carvedilol 25 MG tablet  Commonly known as:  COREG  Take 37.5 mg by mouth 2 (two) times daily with a meal. (1 and 1/2 tablets)     furosemide 40 MG tablet  Commonly known as:  LASIX  Take 1 tablet (40 mg total) by mouth daily.     GLUCOSAMINE CHONDR 1500 COMPLX PO  Take 1,500 mg by mouth daily.     insulin glargine 100 UNIT/ML injection  Commonly known as:  LANTUS  Inject 45 Units into the skin at bedtime.     Magnesium Oxide 500 MG Caps  Take 1,000 mg by mouth.     omega-3 acid ethyl esters 1 G capsule  Commonly known as:  LOVAZA  Take 1 g by mouth daily.     potassium chloride 10 MEQ tablet  Commonly known as:  K-DUR  Take 1 tablet (10 mEq total) by mouth 2 (two) times daily.     ramipril 10 MG capsule  Commonly known as:  ALTACE  Take 1 capsule (10 mg total) by mouth daily.     Rivaroxaban 20 MG Tabs tablet  Commonly known as:  XARELTO  Take 1 tablet (20 mg total) by mouth daily with supper.     traMADol 50 MG tablet  Commonly known as:  ULTRAM  Take 1-2 tablets (50-100 mg total) by mouth every 6 (six) hours as needed for moderate pain.     Vitamin B-12 2000 MCG Tbcr  Take 3 tablets by mouth daily.      vitamin C 500 MG tablet  Commonly known as:  ASCORBIC ACID  Take 1,500 mg by mouth daily.     Vitamin D (Cholecalciferol) 1000 UNITS Tabs  Take 3,000 Units by mouth daily.     vitamin E 400 UNIT capsule  Take 1,200 Units by mouth daily.       Verbal and written Discharge instructions given to the patient. Wound care per Discharge AVS     Follow-up Information   Follow up with Nilda Simmer, MD In 3 weeks. (sent message to office)    Specialty:  Vascular Surgery   Contact information:   [address] [place] [address] [phone]       Signed: Clinton Gallant [facility] 09/28/2013, 9:58 AM  Addendum  I have independently interviewed and examined the patient, and I agree with the physician assistant's discharge summary.  This patient underwent an attempt at left femoral to popliteal bypass but the left below-the-knee poplital artery was found to be chronically occluded.  I endarterectomized and patched this artery with only return of trickle blood flow in the artery.  I also explored the above-the-knee popliteal artery, which was found to be not clampable.  I felt that a fem-tib bypass was not indicated as this patient does not have rest pain or gangrene.  [name], MD [facility] [place] Office: [phone] Pager: [phone]  09/29/2013, 10:31 AM

## 2013-10-06 ENCOUNTER — Other Ambulatory Visit: Payer: Self-pay | Admitting: *Deleted

## 2013-10-06 DIAGNOSIS — G8918 Other acute postprocedural pain: Secondary | ICD-10-CM

## 2013-10-06 MED ORDER — TRAMADOL HCL 50 MG PO TABS: 50.0000 mg | ORAL_TABLET | Freq: Four times a day (QID) | ORAL | Status: DC | PRN

## 2013-10-06 NOTE — Progress Notes (Signed)
Patient called in to report the need for a refill of Tramadol. He is to see Dr. Imogene Burn on 10-16-13 to discuss further surgery for PVD/Claudication symptoms. Patient had an aborted Fem-pop surgery on 09-16-13 by Dr. Imogene Burn. When I questioned him about his pain, he said that it was the same claudication pain as prior to surgery. He also is complaining about pus-like drainage coming out of his upper incision of his left leg; he's afebrile but says the incision is open approximately 1/2 inch. I will refill the Tramadol and patient will come in to see the Nurse Practitioner on Thursday 10-08-13, to look at this wound. Dr. Nicky Pugh appt on 10-16-13 will remain at this time. Patient agrees with this plan.

## 2013-10-07 ENCOUNTER — Encounter: Payer: Self-pay | Admitting: Family

## 2013-10-08 ENCOUNTER — Encounter: Payer: Self-pay | Admitting: Family

## 2013-10-08 ENCOUNTER — Ambulatory Visit (INDEPENDENT_AMBULATORY_CARE_PROVIDER_SITE_OTHER): Payer: No Typology Code available for payment source | Admitting: Family

## 2013-10-08 VITALS — BP 119/72 | HR 60 | Temp 97.3°F | Resp 16 | Ht 71.0 in | Wt 239.0 lb

## 2013-10-08 DIAGNOSIS — Z5189 Encounter for other specified aftercare: Secondary | ICD-10-CM | POA: Insufficient documentation

## 2013-10-08 NOTE — Progress Notes (Signed)
[facility] [place] HISTORY AND PHYSICAL -PAD  History of Present Illness MOROCCO GIPE is a 63 y.o. male patient of Dr. Imogene Burn is s/p Left groin, above-the-knee and below-the-knee popliteal artery exploration, Left below-the-knee popliteal artery endarterectomy with bovine patch angioplasty, Open cannulation of left common femoral artery Left leg runoff on 09/16/2013. He returns today for evaluation of incision not closing, redness at incision, oozing.  He is also s/p R fem-pop BPG by Dr. Madilyn Fireman and L distal SFA stenting (08/30/11).   He ran out of his medications for his chronic medical problems for 2 weeks in Feb., 2015, since he could not afford. Was hospitalized at [facility] the end of January, 2015 for CHF, he also has atrial fib and and DM.  States he has been taking his medications as prescribed since this hospitalization and his blood sugars are much better.  He lost his job last May, 2014, which made his medications unaffordable.  Left heel pain for about 6 weeks, tender to touch, he denies ulcers.  He has left calf claudication after walking about 40 feet, relieved with rest, no claudication symptoms elsewhere.  He denies history of stroke or TIA's.   But states he had a 6 vessel CABG in 2008, was told he had an MI, he had no symptoms. His left radial artery was harvested for grafting.   Pt Diabetic: Yes  Pt smoker: former smoker, quit in 2008  Pt meds include:  Statin :recently started  Betablocker: Yes  ASA: No  Other anticoagulants/antiplatelets: taking Xaralto for atrial fib     Past Medical History  Diagnosis Date  . CAD (coronary artery disease), native coronary artery September 2008    Left main 50-60%, LAD 80% with D2 involved. RCA mid occlusion --> CABG  . 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 Ischemic cardiomyopathy September 2008    EF = 35-40%; THE 45-50% IN 2013 --> ECHO 03/03/13 - EF 40-45% LV: Moderate  HK of basal-midlateral & inferior myocardium; c/w prior infarction in the distribution of the RCA or LCx. Mild-Mod MR with Calcified annulus. Mod TR  . History of MI (myocardial infarction) 2012    Noted by echocardiography, and nuclear stress testing. Known RCA occlusion prior to CABG  . PAF (paroxysmal atrial fibrillation) - initial presentation with RVR, and heart failure 2013    On a beta blocker, calcium channel blocker & Xarelto. Lexiscan Myoview 11/08/11 LV was enlarged with no evidence of ischemia. There was a moderate sized, moderate to severe in intensity fixed defect in the inferior wall suggestive of scar with no reversible ischemia. There was a basal septal akinesis w/severe global hypokinesis. EF = 31% (Which is different from the [facility] EF)  . Chronic diastolic CHF (congestive heart failure), NYHA class 2 2009, 2011; 2013    Exacerbated by A. fib RVR, currently on Lasix.  . Peripheral arterial disease 2009, 2011; 2013    2009, 2011; 2013  . Renal artery stenosis, non-flow-limiting   . DM (diabetes mellitus), type 2 with neurological complications     Peripheral neuropathy  . Diabetic peripheral neuropathy associated with type 2 diabetes mellitus   . Hyperlipidemia LDL goal <70     On statin therapy  . Obesity (BMI 30.0-34.9)   . Hypertension associated with diabetes   . Erectile dysfunction associated with type 2 diabetes mellitus  2006  . Ischemic mitral regurgitation  1980s    Mild to  moderate  . Thrombocytopenia      chronic  . Osteoarthritis of both knees 1990    Status post left knee a arthroscopy, currently [place] right knee  . History of hepatitis B 1980s  . Adopted     Adopted [V68.89]    Social History History  Substance Use Topics  . Smoking status: Former Smoker -- 0.50 packs/day    Types: Cigarettes    Start date: 07/02/1973    Quit date: 07/02/2005  . Smokeless tobacco: Never Used  . Alcohol Use: No    Family History Family History  Problem  Relation Age of Onset  . Adopted: Yes  . Cancer Mother     Past Surgical History  Procedure Laterality Date  . Femoral endarterectomy  03-15-2008    Left EIA & CFA endarterectomy by Dr. Amedeo Plenty  . Knee arthroscopy Left 1990    For osteoarthritis pain.  . Tonsillectomy    . Pleural effusion drainage Right   . Coronary artery bypass graft  03/2007    CABG x 6 LIMA-LAD, left radial to diagonal 1, SVG to OM1-OM2 sequential, SVG to PDA-PLA sequential.  . Transthoracic echocardiogram  November 2014    EF 40-45%. Moderate hypokinesis of the basal and mid lateral and inferior myocardium. Mild to moderate MR. Mild LA dilation.  . Transthoracic echocardiogram  07/26/2013    In setting of acute illness: EF 15-20%, atrial fibrillation; diffuse hypokinesis with inferolateral abnormalities consistent with ischemic heart disease; grade 3 diastolic dysfunction/restrictive physiology - elevated LVEDP/LAP..  . Transthoracic echocardiogram  09/09/2013    40%. Septal hypokinesis. Moderate to severely dilated LV. Mild LVH and diffuse hypokinesis. Mild to moderate MR.  Marland Kitchen Nm myoview ltd  08/13/2048    Moderate LV dilation. Abnormal study with all high-risk features, unchanged from October 2014. Inferior defect likely representing nontransmural scar. Suggesting nonischemic cardiopathy superimposed on known ischemic cardiomyopathy.; Severe global hypokinesis with EF of roughly 31%.  . Vascular surgery Bilateral 2006    pt reports about 4 vascular surgeries starting in 2006 and last was in 2013.  Marland Kitchen Patch angioplasty Left 09/16/2013    Procedure: PATCH ANGIOPLASTY OF LEFT BELOW THE KNEE POPLITEAL ARTERY;  Surgeon: Conrad Maitland, MD;  Location: [place];  Service: Vascular;  Laterality: Left;  . Intraoperative arteriogram Left 09/16/2013    Procedure: INTRA OPERATIVE ARTERIOGRAM;  Surgeon: Conrad Dresden, MD;  Location: [place];  Service: Vascular;  Laterality: Left;  . Groin dissection Left 09/16/2013    Procedure: GROIN  EXPLORATION;  Surgeon: Conrad Midwest, MD;  Location: [place];  Service: Vascular;  Laterality: Left;  . Endarterectomy popliteal Left 09/16/2013    Procedure: ENDARTERECTOMYOF LEFT POPLITEAL ARTERY;  Surgeon: Conrad Carthage, MD;  Location: [place];  Service: Vascular;  Laterality: Left;    No Known Allergies  Current Outpatient Prescriptions  Medication Sig Dispense Refill  . aspirin 81 MG tablet Take 81 mg by mouth daily.      Marland Kitchen atorvastatin (LIPITOR) 20 MG tablet Take 20 mg by mouth daily at 6 PM.      . Calcium Carb-Cholecalciferol (CALCIUM 1000 + D PO) Take 2 tablets by mouth daily.      . carvedilol (COREG) 25 MG tablet Take 37.5 mg by mouth 2 (two) times daily with a meal. (1 and 1/2 tablets)      . Cyanocobalamin (VITAMIN B-12) 2000 MCG TBCR Take 3 tablets by mouth daily.      . furosemide (LASIX) 40 MG tablet  Take 1 tablet (40 mg total) by mouth daily.  30 tablet  0  . Glucosamine-Chondroit-Vit C-Mn (GLUCOSAMINE CHONDR 1500 COMPLX PO) Take 1,500 mg by mouth daily.      . insulin glargine (LANTUS) 100 UNIT/ML injection Inject 45 Units into the skin at bedtime.       Marland Kitchen KLOR-CON M10 10 MEQ tablet       . Magnesium Oxide 500 MG CAPS Take 1,000 mg by mouth.      . omega-3 acid ethyl esters (LOVAZA) 1 G capsule Take 1 g by mouth daily.      . potassium chloride (K-DUR) 10 MEQ tablet Take 1 tablet (10 mEq total) by mouth 2 (two) times daily.  60 tablet  0  . pregabalin (LYRICA) 100 MG capsule Take 100 mg by mouth 2 (two) times daily.      . ramipril (ALTACE) 10 MG capsule Take 1 capsule (10 mg total) by mouth daily.  30 capsule  0  . Rivaroxaban (XARELTO) 20 MG TABS tablet Take 1 tablet (20 mg total) by mouth daily with supper.  30 tablet  0  . traMADol (ULTRAM) 50 MG tablet Take 1 tablet (50 mg total) by mouth every 6 (six) hours as needed for moderate pain.  30 tablet  0  . vitamin C (ASCORBIC ACID) 500 MG tablet Take 1,500 mg by mouth daily.      . Vitamin D, Cholecalciferol, 1000 UNITS TABS  Take 3,000 Units by mouth daily.      . vitamin E 400 UNIT capsule Take 1,200 Units by mouth daily.      . mupirocin ointment (BACTROBAN) 2 %       . [DISCONTINUED] amLODipine (NORVASC) 10 MG tablet Take 10 mg by mouth daily.      . [DISCONTINUED] calcium carbonate (OS-CAL) 600 MG TABS Take 600 mg by mouth daily.      . [DISCONTINUED] pioglitazone-metformin (ACTOPLUS MET) 15-850 MG per tablet Take 1 tablet by mouth 2 (two) times daily with a meal.      . [DISCONTINUED] simvastatin (ZOCOR) 40 MG tablet Take 40 mg by mouth every evening.       No current facility-administered medications for this visit.    ROS: See HPI for pertinent positives and negatives.   Physical Examination  Filed Vitals:   10/08/13 1339  BP: 119/72  Pulse: 60  Temp: 97.3 F (36.3 C)  Resp: 16   Filed Weights   10/08/13 1339  Weight: 239 lb (108.41 kg)   Body mass index is 33.35 kg/(m^2).   General: A&O x 3, WDWN, obese male. Gait: normal Pulmonary: Respirations non labored. Cardiac: Irregular Rhythm.     Aorta is not palpable. Radial pulses: are 2+ and =.                          VASCULAR EXAM: Extremities without ischemic changes  without Gangrene; with open wounds: inner aspect left thigh incision with opening of incision, about 2.5 x 1.5 cm, shallow, scant sero-sanguinous drainage on the inside of his pants, no necrotic tissue seen, no purulence, no swelling at incision, no redness surrounding incision.  LE Pulses LEFT RIGHT       POPLITEAL  not palpable   not palpable       POSTERIOR TIBIAL  not palpable   not palpable        DORSALIS PEDIS      ANTERIOR TIBIAL  palpable   palpable    Abdomen: soft, NT, no masses. Skin: no rashes,see extremities. Musculoskeletal: no muscle wasting or atrophy.  Neurologic: A&O X 3; Appropriate Affect ; SENSATION: normal; MOTOR FUNCTION:  moving all  extremities equally, motor strength 5/5 throughout. Speech is fluent/normal. CN 2-12 grossly intact.   ASSESSMENT:  William Pacheco is a 63 y.o. male who is s/p Left groin, above-the-knee and below-the-knee popliteal artery exploration, Left below-the-knee popliteal artery endarterectomy with bovine patch angioplasty, Open cannulation of left common femoral artery Left leg runoff on 09/16/2013. He returns today for evaluation of incision not closing, redness at incision, oozing.  He is also s/p R fem-pop BPG by Dr. Amedeo Plenty and L distal SFA stenting (08/30/11). His incisions show no signs of infection; small separation of incision at inner aspect left thigh should granulate well if kept clean and dry with daily soap and water cleansing, and folded 4x4 gauze applied with tape to keep from rubbing and draining on his pants.     PLAN:  Based on the patient's vascular studies and examination, pt will return to clinic as scheduled on 10/16/2013 with Dr. Bridgett Larsson.  The patient was given information about PAD including signs, symptoms, treatment, what symptoms should prompt the patient to seek immediate medical care, and risk reduction measures to take.  Clemon Chambers, RN, MSN, FNP-C [facility] Phone: [phone]  Clinic MD: Oneida Alar  10/08/2013 1:51 PM

## 2013-10-12 ENCOUNTER — Ambulatory Visit: Payer: No Typology Code available for payment source | Admitting: Dietician

## 2013-10-15 ENCOUNTER — Encounter: Payer: Self-pay | Admitting: Vascular Surgery

## 2013-10-16 ENCOUNTER — Ambulatory Visit (INDEPENDENT_AMBULATORY_CARE_PROVIDER_SITE_OTHER): Payer: No Typology Code available for payment source | Admitting: Vascular Surgery

## 2013-10-16 ENCOUNTER — Encounter: Payer: Self-pay | Admitting: Vascular Surgery

## 2013-10-16 VITALS — BP 137/78 | HR 81 | Ht 71.0 in | Wt 239.2 lb

## 2013-10-16 DIAGNOSIS — I739 Peripheral vascular disease, unspecified: Secondary | ICD-10-CM | POA: Insufficient documentation

## 2013-10-16 DIAGNOSIS — Z5189 Encounter for other specified aftercare: Secondary | ICD-10-CM

## 2013-10-16 DIAGNOSIS — I70219 Atherosclerosis of native arteries of extremities with intermittent claudication, unspecified extremity: Secondary | ICD-10-CM

## 2013-10-16 MED ORDER — TRAMADOL HCL 50 MG PO TABS: 50.0000 mg | ORAL_TABLET | Freq: Four times a day (QID) | ORAL | Status: AC | PRN

## 2013-10-16 NOTE — Progress Notes (Signed)
    Postoperative Visit   History of Present Illness  William Pacheco is a 63 y.o. year old male who presents for postoperative follow-up for: :  1. Left groin, above-the-knee and below-the-knee popliteal artery exploration 2. Left below-the-knee popliteal artery endarterectomy with bovine patch angioplasty 3. Open cannulation of left common femoral artery  4. Left leg runoff  (Date: 09/17/13).  Unfortunately due to severe calcification of the above the knee popliteal artery and chronic occlusion of the below the knee popliteal artery, the bypass could not be completed without considering a fem-tibial bypass.  In the setting of intermittent claudication, I did not feel a fem-tib bypass was indicated due to risk of lilmb loss.  The patient's heel ulcer has healed but he has had some wound issues with his above-the-knee exposure with serous drainage.  The drainage has resolved but he has had the above-the-knee exposure open.  The patient notes continued short distance claudication that is interfering with his ability to work.  The patient is able to complete their activities of daily living with some difficulty.  For VQI Use Only  PRE-ADM LIVING: Home  AMB STATUS: Ambulatory  Physical Examination  Filed Vitals:   10/16/13 1541  BP: 137/78  Pulse: 81   LUE: R groin incision is open, AK exposure has some superficial separation with eschar present, L calf incision mostly healed, pedal pulses are not palpable, L foot appear well perfuse, hyperkeratosis with crack in heel without ulcer  Medical Decision Making  William Pacheco is a 63 y.o. year old male who presents s/p L popliteal exploration, L BK pop EA with BPA, short distance lifestyle limiting intermittent claudication   The patient's bypass incisions are healing appropriately with resolution of pre-operative symptoms. I discussed in depth with the patient the nature of atherosclerosis, and emphasized the importance of maximal medical  management including strict control of blood pressure, blood glucose, and lipid levels, obtaining regular exercise, and cessation of smoking.  The patient is aware that without maximal medical management the underlying atherosclerotic disease process will progress, limiting the benefit of any interventions.  I have optimized this patient's medical care in regards to PAD.  He is not a Pletal candidate due to history of heart failure.  I am referring the patient for cardiac rehab with secondary intention of trying to get vascular rehab.  Thank you for allowing [place] to participate in this patient's care.  He will follow up one month for wound check.  [name], MD [facility] [place] Office: [phone] Pager: [phone]  10/16/2013, 4:32 PM

## 2013-10-19 NOTE — Addendum Note (Signed)
Addended by: Adria Dill L on: 10/19/2013 11:02 AM   Modules accepted: Orders

## 2013-10-27 ENCOUNTER — Ambulatory Visit (INDEPENDENT_AMBULATORY_CARE_PROVIDER_SITE_OTHER): Payer: No Typology Code available for payment source | Admitting: Podiatry

## 2013-10-27 ENCOUNTER — Ambulatory Visit (INDEPENDENT_AMBULATORY_CARE_PROVIDER_SITE_OTHER): Payer: No Typology Code available for payment source

## 2013-10-27 ENCOUNTER — Encounter: Payer: Self-pay | Admitting: Podiatry

## 2013-10-27 VITALS — BP 132/67 | HR 79 | Resp 18 | Ht 71.0 in | Wt 229.8 lb

## 2013-10-27 DIAGNOSIS — M79609 Pain in unspecified limb: Secondary | ICD-10-CM

## 2013-10-27 DIAGNOSIS — B351 Tinea unguium: Secondary | ICD-10-CM

## 2013-10-27 DIAGNOSIS — E119 Type 2 diabetes mellitus without complications: Secondary | ICD-10-CM

## 2013-10-27 NOTE — Progress Notes (Signed)
   Subjective:    Patient ID: William Pacheco, male    DOB: 12-Dec-1950, 63 y.o.   MRN: [id number]  HPI    Review of Systems  Endocrine:       Diabetes 17 years last a1c 8.1   All other systems reviewed and are negative.      Objective:   Physical Exam        Assessment & Plan:

## 2013-10-27 NOTE — Progress Notes (Signed)
   Subjective:    Patient ID: William Pacheco, male    DOB: 01/11/1951, 63 y.o.   MRN: [id number]  HPI Comments: To get my toenails clipped off. Diabetes for 17 years last a1c 8.1 , thick and painful toenails      Review of Systems     Objective:   Physical Exam: I have reviewed his past medical history medications allergies surgeries review of systems. Pulses are barely palpable bilateral capillary fill time to digits one through 5 is immediate. History of peripheral arterial disease. Neurologic sensorium is decreased per since once the monofilament bilateral. Deep tendon reflexes are 1 elicitable. Muscle strength is 5 over 5 dorsiflexors plantar flexors inverters everters all intrinsic musculature appears are intact orthopedic evaluation demonstrates all joints distal to the ankle a full range of motion the crepitus with exception of the digits which did appear to be arthritic in nature with hammertoe deformities. Mild hallux valgus deformity. Cutaneous evaluation demonstrates dry xerotic cutis thick yellow dystrophic clinically mycotic nails which are painful palpation as well as debridement. Calluses to the plantar medial aspect the bilateral foot porokeratotic type lesions plantarly bilateral.        Assessment & Plan:  Assessment: Diabetes with a history of peripheral vascular disease and peripheral neuropathy pain in limb secondary to onychomycosis and calluses.  Plan: Debridement of all reactive hyperkeratosis debridement of nails 1 through 5 bilateral covered service secondary to pain.

## 2013-11-02 ENCOUNTER — Ambulatory Visit (INDEPENDENT_AMBULATORY_CARE_PROVIDER_SITE_OTHER): Payer: No Typology Code available for payment source | Admitting: Family

## 2013-11-02 ENCOUNTER — Encounter: Payer: Self-pay | Admitting: Family

## 2013-11-02 VITALS — BP 141/79 | HR 79 | Temp 98.5°F | Resp 16 | Ht 71.0 in | Wt 238.0 lb

## 2013-11-02 DIAGNOSIS — M79609 Pain in unspecified limb: Secondary | ICD-10-CM

## 2013-11-02 DIAGNOSIS — Z5189 Encounter for other specified aftercare: Secondary | ICD-10-CM

## 2013-11-02 NOTE — Progress Notes (Signed)
[facility] [place] HISTORY AND PHYSICAL -PAD  History of Present Illness William Pacheco is a 63 y.o. male patient of Dr. Bridgett Larsson is s/p Left groin, above-the-knee and below-the-knee popliteal artery exploration, Left below-the-knee popliteal artery endarterectomy with bovine patch angioplasty, Open cannulation of left common femoral artery Left leg runoff on 09/16/2013.  He is also s/p R fem-pop BPG by Dr. Amedeo Plenty and L distal SFA stenting (08/30/11). He returns today for continued open area of left medial lower leg incision, draining small to moderate amount serous type drainage on pants leg and bed linen, 2-8/10 pain in left lower leg, medial aspect. Liver enzymes and creatinine were normal in March, 2015, takes ibuprofen for pain with adequate pain relief. He ran out of his medications for his chronic medical problems for 2 weeks in Feb., 2015, since he could not afford. Was hospitalized at [facility] the end of January, 2015 for CHF, he also has atrial fib and and DM.  States he has been taking his medications as prescribed since this hospitalization and his blood sugars are much better.  He lost his job last May, 2014, which made his medications unaffordable.  Left heel pain resolved after 09/16/13 open cannulation of left common femoral artery. He has left calf claudication after walking about 40 feet, relieved with rest, no claudication symptoms elsewhere.  He denies history of stroke or TIA's.  But states he had a 6 vessel CABG in 2008, was told he had an MI, he had no symptoms. His left radial artery was harvested for grafting.  His opthalmologist, pt states, has told him he had a stroke in one of his eyes at some point.  He will be entering a diabetic program on 11/17/13. He states his last A1C was 8.2 a couple of weeks ago, down from 11.?  Pt Diabetic: Yes  Pt smoker: former smoker, quit in 2008  Pt meds include:  Statin :recently started  Betablocker: Yes  ASA: No  Other  anticoagulants/antiplatelets: taking Xaralto for atrial fib   Past Medical History  Diagnosis Date  . CAD (coronary artery disease), native coronary artery September 2008    Left main 50-60%, LAD 80% with D2 involved. RCA mid occlusion --> CABG  . 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 Ischemic cardiomyopathy September 2008    EF = 35-40%; THE 45-50% IN 2013 --> ECHO 03/03/13 - EF 40-45% LV: Moderate HK of basal-midlateral & inferior myocardium; c/w prior infarction in the distribution of the RCA or LCx. Mild-Mod MR with Calcified annulus. Mod TR  . History of MI (myocardial infarction) 2012    Noted by echocardiography, and nuclear stress testing. Known RCA occlusion prior to CABG  . PAF (paroxysmal atrial fibrillation) - initial presentation with RVR, and heart failure 2013    On a beta blocker, calcium channel blocker & Xarelto. Lexiscan Myoview 11/08/11 LV was enlarged with no evidence of ischemia. There was a moderate sized, moderate to severe in intensity fixed defect in the inferior wall suggestive of scar with no reversible ischemia. There was a basal septal akinesis w/severe global hypokinesis. EF = 31% (Which is different from the [facility] EF)  . Chronic diastolic CHF (congestive heart failure), NYHA class 2 2009, 2011; 2013    Exacerbated by A. fib RVR, currently on Lasix.  . Peripheral arterial disease 2009, 2011; 2013    2009, 2011; 2013  . Renal artery stenosis, non-flow-limiting   .  DM (diabetes mellitus), type 2 with neurological complications     Peripheral neuropathy  . Diabetic peripheral neuropathy associated with type 2 diabetes mellitus   . Hyperlipidemia LDL goal <70     On statin therapy  . Obesity (BMI 30.0-34.9)   . Hypertension associated with diabetes   . Erectile dysfunction associated with type 2 diabetes mellitus  2006  . Ischemic mitral regurgitation  1980s    Mild to moderate  . Thrombocytopenia      chronic  .  Osteoarthritis of both knees 1990    Status post left knee a arthroscopy, currently [place] right knee  . History of hepatitis B 1980s  . Adopted     Adopted [V68.89]    Social History History  Substance Use Topics  . Smoking status: Former Smoker -- 0.50 packs/day    Types: Cigarettes    Start date: 07/02/1973    Quit date: 07/02/2005  . Smokeless tobacco: Never Used  . Alcohol Use: No    Family History Family History  Problem Relation Age of Onset  . Adopted: Yes  . Cancer Mother     Past Surgical History  Procedure Laterality Date  . Femoral endarterectomy  03-15-2008    Left EIA & CFA endarterectomy by Dr. Amedeo Plenty  . Knee arthroscopy Left 1990    For osteoarthritis pain.  . Tonsillectomy    . Pleural effusion drainage Right   . Coronary artery bypass graft  03/2007    CABG x 6 LIMA-LAD, left radial to diagonal 1, SVG to OM1-OM2 sequential, SVG to PDA-PLA sequential.  . Transthoracic echocardiogram  November 2014    EF 40-45%. Moderate hypokinesis of the basal and mid lateral and inferior myocardium. Mild to moderate MR. Mild LA dilation.  . Transthoracic echocardiogram  07/26/2013    In setting of acute illness: EF 15-20%, atrial fibrillation; diffuse hypokinesis with inferolateral abnormalities consistent with ischemic heart disease; grade 3 diastolic dysfunction/restrictive physiology - elevated LVEDP/LAP..  . Transthoracic echocardiogram  09/09/2013    40%. Septal hypokinesis. Moderate to severely dilated LV. Mild LVH and diffuse hypokinesis. Mild to moderate MR.  Marland Kitchen Nm myoview ltd  08/13/2048    Moderate LV dilation. Abnormal study with all high-risk features, unchanged from October 2014. Inferior defect likely representing nontransmural scar. Suggesting nonischemic cardiopathy superimposed on known ischemic cardiomyopathy.; Severe global hypokinesis with EF of roughly 31%.  . Vascular surgery Bilateral 2006    pt reports about 4 vascular surgeries starting in 2006 and  last was in 2013.  Marland Kitchen Patch angioplasty Left 09/16/2013    Procedure: PATCH ANGIOPLASTY OF LEFT BELOW THE KNEE POPLITEAL ARTERY;  Surgeon: Conrad Dola, MD;  Location: [place];  Service: Vascular;  Laterality: Left;  . Intraoperative arteriogram Left 09/16/2013    Procedure: INTRA OPERATIVE ARTERIOGRAM;  Surgeon: Conrad [place], MD;  Location: [place];  Service: Vascular;  Laterality: Left;  . Groin dissection Left 09/16/2013    Procedure: GROIN EXPLORATION;  Surgeon: Conrad [place], MD;  Location: [place];  Service: Vascular;  Laterality: Left;  . Endarterectomy popliteal Left 09/16/2013    Procedure: ENDARTERECTOMYOF LEFT POPLITEAL ARTERY;  Surgeon: Conrad [place], MD;  Location: [place];  Service: Vascular;  Laterality: Left;    No Known Allergies  Current Outpatient Prescriptions  Medication Sig Dispense Refill  . acetaminophen (TYLENOL) 500 MG tablet Take 500 mg by mouth every 6 (six) hours as needed.      Marland Kitchen aspirin 81 MG tablet Take 81 mg  by mouth daily.      Marland Kitchen atorvastatin (LIPITOR) 20 MG tablet Take 20 mg by mouth daily at 6 PM.      . Calcium Carb-Cholecalciferol (CALCIUM 1000 + D PO) Take 2 tablets by mouth daily.      . calcium carbonate (OS-CAL) 600 MG TABS tablet Take 600 mg by mouth 2 (two) times daily with a meal. Take two tablets daily      . carvedilol (COREG) 25 MG tablet Take 37.5 mg by mouth 2 (two) times daily with a meal. (1 and 1/2 tablets)      . Cyanocobalamin (VITAMIN B-12) 2000 MCG TBCR Take 3 tablets by mouth daily.      . furosemide (LASIX) 40 MG tablet Take 1 tablet (40 mg total) by mouth daily.  30 tablet  0  . Glucosamine-Chondroit-Vit C-Mn (GLUCOSAMINE CHONDR 1500 COMPLX PO) Take 1,500 mg by mouth daily.      . insulin glargine (LANTUS) 100 UNIT/ML injection Inject 45 Units into the skin at bedtime.       Marland Kitchen KLOR-CON M10 10 MEQ tablet       . LANTUS SOLOSTAR 100 UNIT/ML Solostar Pen       . Magnesium Oxide 500 MG CAPS Take 1,000 mg by mouth.      . mupirocin ointment  (BACTROBAN) 2 %       . omega-3 acid ethyl esters (LOVAZA) 1 G capsule Take 1 g by mouth daily.      . potassium chloride (K-DUR) 10 MEQ tablet Take 1 tablet (10 mEq total) by mouth 2 (two) times daily.  60 tablet  0  . pregabalin (LYRICA) 100 MG capsule Take 100 mg by mouth 2 (two) times daily.      . ramipril (ALTACE) 10 MG capsule Take 1 capsule (10 mg total) by mouth daily.  30 capsule  0  . Rivaroxaban (XARELTO) 20 MG TABS tablet Take 1 tablet (20 mg total) by mouth daily with supper.  30 tablet  0  . vitamin C (ASCORBIC ACID) 500 MG tablet Take 1,500 mg by mouth daily.      . Vitamin D, Cholecalciferol, 1000 UNITS TABS Take 3,000 Units by mouth daily.      . vitamin E 400 UNIT capsule Take 1,200 Units by mouth daily.      . traMADol (ULTRAM) 50 MG tablet Take 1 tablet (50 mg total) by mouth every 6 (six) hours as needed.  50 tablet  0  . [DISCONTINUED] amLODipine (NORVASC) 10 MG tablet Take 10 mg by mouth daily.      . [DISCONTINUED] pioglitazone-metformin (ACTOPLUS MET) 15-850 MG per tablet Take 1 tablet by mouth 2 (two) times daily with a meal.      . [DISCONTINUED] simvastatin (ZOCOR) 40 MG tablet Take 40 mg by mouth every evening.       No current facility-administered medications for this visit.    ROS: See HPI for pertinent positives and negatives.   Physical Examination   Filed Vitals:   11/02/13 1600  BP: 141/79  Pulse: 79  Temp: 98.5 F (36.9 C)  TempSrc: Oral  Resp: 16  Height: $Remove'5\' 11"'[id number]$  (1.803 m)  Weight: 238 lb (107.956 kg)  SpO2: 98%  Body mass index is 33.21 kg/(m^2).   General: A&O x 3, WDWN, obese male.  Gait: normal  Pulmonary: Respirations non labored.  Cardiac: Irregular Rhythm.  Aorta is not palpable.  Radial pulses: are 2+ and =.   VASCULAR EXAM:  Extremities without  ischemic changes  without Gangrene; with open wounds: inner aspect left thigh incision with opening of incision, about 2.5 x 1.5 cm, shallow, scant sero-sanguinous drainage on the  inside of his pants, no necrotic tissue seen, no purulence, no swelling at incision, no redness surrounding incision.   LE Pulses  LEFT  RIGHT   FEMORAL palpable palpable  POPLITEAL  not palpable  not palpable   POSTERIOR TIBIAL  not palpable  not palpable   DORSALIS PEDIS  ANTERIOR TIBIAL  palpable  palpable   Abdomen: soft, NT, no masses.  Skin: no rashes,see extremities.  Musculoskeletal: no muscle wasting or atrophy.  Neurologic: A&O X 3; Appropriate Affect ; SENSATION: normal; MOTOR FUNCTION: moving all extremities equally, motor strength 5/5 throughout. Speech is fluent/normal. CN 2-12 grossly intact.   ASSESSMENT: William Pacheco is a 63 y.o. male who is s/p left groin, above-the-knee and below-the-knee popliteal artery exploration, Left below-the-knee popliteal artery endarterectomy with bovine patch angioplasty, Open cannulation of left common femoral artery Left leg runoff on 09/16/2013.  He is also s/p R fem-pop BPG by Dr. Amedeo Plenty and L distal SFA stenting (08/30/11). He returns today for continued open area of left medial lower leg incision, 2-8/10 pain. Liver enzymes and creatinine were normal last month, takes ibuprofen for pain with adequate pain relief. Slow granulation of left lower leg medial aspect incision, no infection, in background of uncontrolled DM, but significantly improved DM control over the last couple of months. Consider addition of rapid acting (bolus) insulin to cover post prandial glucose excursions; his only insulin at this point is basal insulin (long acting). Graduated walking program and DM management to promote good circulation and wound granulation. See Plan.   PLAN:  Wet to dry dressings one to two times/day as directed. Try over the counter acetaminophen (Tylenol), 750 mg to 1000 mg every 8 to twelve hours as needed for pain. Walk to the point of calf pain and a little past, rest until the pain subsides, then resume walking; do this several  times/day.  I discussed in depth with the patient the nature of atherosclerosis, and emphasized the importance of maximal medical management including strict control of blood pressure, blood glucose, and lipid levels, obtaining regular exercise, and continued cessation of smoking.  The patient is aware that without maximal medical management the underlying atherosclerotic disease process will progress, limiting the benefit of any interventions.  Based on the patient's vascular studies and examination, pt will return to clinic as scheduled with Dr. Bridgett Larsson in a couple of weeks.  The patient was given information about PAD including signs, symptoms, treatment, what symptoms should prompt the patient to seek immediate medical care, and risk reduction measures to take.  Clemon Chambers, RN, MSN, FNP-C [facility] Phone: [phone]  Clinic MD: Trula Slade  11/02/2013 4:11 PM

## 2013-11-02 NOTE — Patient Instructions (Signed)
Peripheral Vascular Disease Peripheral Vascular Disease (PVD), also called Peripheral Arterial Disease (PAD), is a circulation problem caused by cholesterol (atherosclerotic plaque) deposits in the arteries. PVD commonly occurs in the lower extremities (legs) but it can occur in other areas of the body, such as your arms. The cholesterol buildup in the arteries reduces blood flow which can cause pain and other serious problems. The presence of PVD can place a person at risk for Coronary Artery Disease (CAD).  CAUSES  Causes of PVD can be many. It is usually associated with more than one risk factor such as:   High Cholesterol.  Smoking.  Diabetes.  Lack of exercise or inactivity.  High blood pressure (hypertension).  Obesity.  Family history. SYMPTOMS   When the lower extremities are affected, patients with PVD may experience:  Leg pain with exertion or physical activity. This is called INTERMITTENT CLAUDICATION. This may present as cramping or numbness with physical activity. The location of the pain is associated with the level of blockage. For example, blockage at the abdominal level (distal abdominal aorta) may result in buttock or hip pain. Lower leg arterial blockage may result in calf pain.  As PVD becomes more severe, pain can develop with less physical activity.  In people with severe PVD, leg pain may occur at rest.  Other PVD signs and symptoms:  Leg numbness or weakness.  Coldness in the affected leg or foot, especially when compared to the other leg.  A change in leg color.  Patients with significant PVD are more prone to ulcers or sores on toes, feet or legs. These may take longer to heal or may reoccur. The ulcers or sores can become infected.  If signs and symptoms of PVD are ignored, gangrene may occur. This can result in the loss of toes or loss of an entire limb.  Not all leg pain is related to PVD. Other medical conditions can cause leg pain such  as:  Blood clots (embolism) or Deep Vein Thrombosis.  Inflammation of the blood vessels (vasculitis).  Spinal stenosis. DIAGNOSIS  Diagnosis of PVD can involve several different types of tests. These can include:  Pulse Volume Recording Method (PVR). This test is simple, painless and does not involve the use of X-rays. PVR involves measuring and comparing the blood pressure in the arms and legs. An ABI (Ankle-Brachial Index) is calculated. The normal ratio of blood pressures is 1. As this number becomes smaller, it indicates more severe disease.  < 0.95  indicates significant narrowing in one or more leg vessels.  <0.8 there will usually be pain in the foot, leg or buttock with exercise.  <0.4 will usually have pain in the legs at rest.  <0.25  usually indicates limb threatening PVD.  Doppler detection of pulses in the legs. This test is painless and checks to see if you have a pulses in your legs/feet.  A dye or contrast material (a substance that highlights the blood vessels so they show up on x-ray) may be given to help your caregiver better see the arteries for the following tests. The dye is eliminated from your body by the kidney's. Your caregiver may order blood work to check your kidney function and other laboratory values before the following tests are performed:  Magnetic Resonance Angiography (MRA). An MRA is a picture study of the blood vessels and arteries. The MRA machine uses a large magnet to produce images of the blood vessels.  Computed Tomography Angiography (CTA). A CTA is a   specialized x-ray that looks at how the blood flows in your blood vessels. An IV may be inserted into your arm so contrast dye can be injected.  Angiogram. Is a procedure that uses x-rays to look at your blood vessels. This procedure is minimally invasive, meaning a small incision (cut) is made in your groin. A small tube (catheter) is then inserted into the artery of your groin. The catheter is  guided to the blood vessel or artery your caregiver wants to examine. Contrast dye is injected into the catheter. X-rays are then taken of the blood vessel or artery. After the images are obtained, the catheter is taken out. TREATMENT  Treatment of PVD involves many interventions which may include:  Lifestyle changes:  Quitting smoking.  Exercise.  Following a low fat, low cholesterol diet.  Control of diabetes.  Foot care is very important to the PVD patient. Good foot care can help prevent infection.  Medication:  Cholesterol-lowering medicine.  Blood pressure medicine.  Anti-platelet drugs.  Certain medicines may reduce symptoms of Intermittent Claudication.  Interventional/Surgical options:  Angioplasty. An Angioplasty is a procedure that inflates a balloon in the blocked artery. This opens the blocked artery to improve blood flow.  Stent Implant. A wire mesh tube (stent) is placed in the artery. The stent expands and stays in place, allowing the artery to remain open.  Peripheral Bypass Surgery. This is a surgical procedure that reroutes the blood around a blocked artery to help improve blood flow. This type of procedure may be performed if Angioplasty or stent implants are not an option. SEEK IMMEDIATE MEDICAL CARE IF:   You develop pain or numbness in your arms or legs.  Your arm or leg turns cold, becomes blue in color.  You develop redness, warmth, swelling and pain in your arms or legs. MAKE SURE YOU:   Understand these instructions.  Will watch your condition.  Will get help right away if you are not doing well or get worse. Document Released: 07/26/2004 Document Revised: 09/10/2011 Document Reviewed: 06/22/2008 [facility] Patient Information 2014 [place], [place].  Wet to dry dressings one to two times/day as directed. Try over the counter acetaminophen (Tylenol), 750 mg to 1000 mg every 8 to twelve hours. Walk to the point of calf pain and a little past,  rest until the pain subsides, then resume walking; do this several times/day.  Keep your blood sugar in as good control as possible.

## 2013-11-04 ENCOUNTER — Other Ambulatory Visit (HOSPITAL_COMMUNITY): Payer: Self-pay | Admitting: Ophthalmology

## 2013-11-04 ENCOUNTER — Other Ambulatory Visit (HOSPITAL_COMMUNITY): Payer: No Typology Code available for payment source

## 2013-11-04 ENCOUNTER — Ambulatory Visit (HOSPITAL_COMMUNITY)
Admission: RE | Admit: 2013-11-04 | Discharge: 2013-11-04 | Disposition: A | Discharge status: Home / Self Care | Payer: No Typology Code available for payment source | Source: Ambulatory Visit | Attending: Ophthalmology | Admitting: Ophthalmology

## 2013-11-04 DIAGNOSIS — H34239 Retinal artery branch occlusion, unspecified eye: Secondary | ICD-10-CM

## 2013-11-04 DIAGNOSIS — E785 Hyperlipidemia, unspecified: Secondary | ICD-10-CM | POA: Insufficient documentation

## 2013-11-04 DIAGNOSIS — I2510 Atherosclerotic heart disease of native coronary artery without angina pectoris: Secondary | ICD-10-CM | POA: Insufficient documentation

## 2013-11-04 DIAGNOSIS — I6350 Cerebral infarction due to unspecified occlusion or stenosis of unspecified cerebral artery: Secondary | ICD-10-CM

## 2013-11-04 DIAGNOSIS — I4891 Unspecified atrial fibrillation: Secondary | ICD-10-CM | POA: Insufficient documentation

## 2013-11-04 DIAGNOSIS — H34219 Partial retinal artery occlusion, unspecified eye: Secondary | ICD-10-CM | POA: Insufficient documentation

## 2013-11-04 DIAGNOSIS — Z951 Presence of aortocoronary bypass graft: Secondary | ICD-10-CM | POA: Insufficient documentation

## 2013-11-04 DIAGNOSIS — E119 Type 2 diabetes mellitus without complications: Secondary | ICD-10-CM | POA: Insufficient documentation

## 2013-11-04 NOTE — Progress Notes (Signed)
VASCULAR LAB PRELIMINARY  PRELIMINARY  PRELIMINARY  PRELIMINARY  Carotid duplex completed.    Preliminary report:  Bilateral - 1% to 39% ICA stenosis. Right vertebral artery flow is antegrade. Left vertebral artery flow is atypical - dampened and somewhat spiked  Jakaden Ouzts D Roemello Speyer, RVS 11/04/2013, 6:09 PM

## 2013-11-09 ENCOUNTER — Ambulatory Visit (INDEPENDENT_AMBULATORY_CARE_PROVIDER_SITE_OTHER): Payer: No Typology Code available for payment source | Admitting: Cardiology

## 2013-11-09 VITALS — BP 113/64 | HR 78 | Ht 71.0 in | Wt 240.3 lb

## 2013-11-09 DIAGNOSIS — I255 Ischemic cardiomyopathy: Secondary | ICD-10-CM

## 2013-11-09 DIAGNOSIS — E1140 Type 2 diabetes mellitus with diabetic neuropathy, unspecified: Secondary | ICD-10-CM

## 2013-11-09 DIAGNOSIS — Z7901 Long term (current) use of anticoagulants: Secondary | ICD-10-CM

## 2013-11-09 DIAGNOSIS — I5042 Chronic combined systolic (congestive) and diastolic (congestive) heart failure: Secondary | ICD-10-CM

## 2013-11-09 DIAGNOSIS — I739 Peripheral vascular disease, unspecified: Secondary | ICD-10-CM

## 2013-11-09 DIAGNOSIS — I509 Heart failure, unspecified: Secondary | ICD-10-CM

## 2013-11-09 DIAGNOSIS — H349 Unspecified retinal vascular occlusion: Secondary | ICD-10-CM

## 2013-11-09 DIAGNOSIS — Z9119 Patient's noncompliance with other medical treatment and regimen: Secondary | ICD-10-CM

## 2013-11-09 DIAGNOSIS — Z91199 Patient's noncompliance with other medical treatment and regimen due to unspecified reason: Secondary | ICD-10-CM

## 2013-11-09 DIAGNOSIS — I2510 Atherosclerotic heart disease of native coronary artery without angina pectoris: Secondary | ICD-10-CM

## 2013-11-09 DIAGNOSIS — IMO0002 Reserved for concepts with insufficient information to code with codable children: Secondary | ICD-10-CM

## 2013-11-09 DIAGNOSIS — I4891 Unspecified atrial fibrillation: Secondary | ICD-10-CM

## 2013-11-09 DIAGNOSIS — I480 Paroxysmal atrial fibrillation: Secondary | ICD-10-CM

## 2013-11-09 DIAGNOSIS — I10 Essential (primary) hypertension: Secondary | ICD-10-CM

## 2013-11-09 DIAGNOSIS — E785 Hyperlipidemia, unspecified: Secondary | ICD-10-CM

## 2013-11-09 DIAGNOSIS — E1165 Type 2 diabetes mellitus with hyperglycemia: Secondary | ICD-10-CM

## 2013-11-09 DIAGNOSIS — E669 Obesity, unspecified: Secondary | ICD-10-CM

## 2013-11-09 DIAGNOSIS — E1149 Type 2 diabetes mellitus with other diabetic neurological complication: Secondary | ICD-10-CM

## 2013-11-09 DIAGNOSIS — I2589 Other forms of chronic ischemic heart disease: Secondary | ICD-10-CM

## 2013-11-09 DIAGNOSIS — E1142 Type 2 diabetes mellitus with diabetic polyneuropathy: Secondary | ICD-10-CM

## 2013-11-09 MED ORDER — FUROSEMIDE 40 MG PO TABS
40.0000 mg | ORAL_TABLET | Freq: Two times a day (BID) | ORAL | Status: DC
Start: 2013-11-09 — End: 2014-08-10

## 2013-11-09 NOTE — Patient Instructions (Signed)
Your physician recommends that you schedule a follow-up appointment in: Keep Appointment with Dr Herbie Baltimore in October   Your physician has recommended you make the following change in your medication: take 1-2 tablets of lasix

## 2013-11-12 NOTE — Progress Notes (Signed)
PCP: Dorrene German, MD  Clinic Note: Chief Complaint  Patient presents with  . 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   HPI: William Pacheco is a 63 y.o. male with a Cardiovascular Problem List below who presents today for postoperative followup status post right femoropopliteal bypass and left SFA stent. I last saw in the operatively for risk assessment.  He apparently did well postoperatively. He still has some soreness with the incision sites, but does note that he isn't significantly improved claudication. 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 He had carotid Dopplers done on May 6 that revealed mild to moderate heterogenous plaque throughout. No significant stenoses.he did have notable high bifurcations. He is partially seen need to discuss this finding.  Interval History: from a cardiac standpoint, he still has fatigue and exertional dyspnea, but is considerably better than he was several months ago, now he is back on his medications. He has not had any anginal symptoms of chest pressure at rest or exertion. He does get exertional dyspnea, but no real PND or orthopnea.  Occasionally he gets edema, but not in a significant amount. He has not had any further sensation of a rapid or irregular heartbeats to suggest recurrence of A. Fib. No syncope/near severe TIA/amaurosis fugax type symptoms. He seems to be tolerating a Xarelto relatively well problems concerning melena, hematochezia or hematuria.  He does not describe any recent stroke-like symptoms, and there was no clear time as to when this retinal artery occlusion occurred.    ICD-9-CM  1. Ischemic cardiomyopathy; EF roughly 40% 414.8  2. CAD- CABG x 6 9/08- Myoview 5/13 with scar, no ischemia 414.00  3. Chronic combined systolic and diastolic congestive heart  failure, NYHA class 2 428.42   428.0  4. PAF (paroxysmal atrial fibrillation) 427.31  5. DM type 2, uncontrolled, with neuropathy 250.62   357.2  6. PAD - R Fem-Pop, L SFA stent 443.9  7. Hyperlipidemia 272.4  8. Obesity (BMI 30-39.9) 278.00  9. Anticoagulation adequate, on Xarelto V58.61  10. Hypertension 401.9  11. Retinal artery occlusion 362.30  12. Noncompliance V15.81    Past Cardiovascular Procedures/Tests History  Procedure Laterality Date  . Femoral endarterectomy  03-15-2008    Left EIA & CFA endarterectomy by Dr. Madilyn Fireman  . Coronary artery bypass graft  03/2007    CABG x 6 LIMA-LAD, left radial to diagonal 1, SVG to OM1-OM2 sequential, SVG to PDA-PLA sequential.  . Transthoracic echocardiogram  November 2014    EF 40-45%. Moderate hypokinesis of the basal and mid lateral and inferior myocardium. Mild to moderate MR. Mild LA dilation.  . Transthoracic echocardiogram  07/26/2013    In setting of acute illness: EF 15-20%, atrial fibrillation; diffuse hypokinesis with inferolateral abnormalities consistent with ischemic heart disease; grade 3 diastolic dysfunction/restrictive physiology - elevated LVEDP/LAP..  . Transthoracic echocardiogram  09/09/2013    40%. Septal hypokinesis. Moderate to severely dilated LV. Mild LVH and diffuse hypokinesis. Mild to moderate MR.  Marland Kitchen Nm myoview ltd  08/13/2048    Moderate LV dilation. Abnormal study with all high-risk features, unchanged from October 2014. Inferior defect likely representing nontransmural scar. Suggesting nonischemic cardiopathy superimposed on known ischemic cardiomyopathy.; Severe global hypokinesis with EF of roughly 31%.  . Vascular surgery Bilateral 2006    pt reports about 4 vascular  surgeries starting in 2006 and last was in 2013.  Marland Kitchen. Patch angioplasty Left 09/16/2013    Procedure: PATCH ANGIOPLASTY OF LEFT BELOW THE KNEE POPLITEAL ARTERY;  Surgeon: Fransisco HertzBrian L Chen, MD;  [facility];  Service: Vascular;  Laterality: Left;  .  Intraoperative arteriogram Left 09/16/2013    Procedure: INTRA OPERATIVE ARTERIOGRAM;  Surgeon: Fransisco HertzBrian L Chen, MD;  [facility];  Service: Vascular;  Laterality: Left;  . Groin dissection Left 09/16/2013    Procedure: GROIN EXPLORATION;  Surgeon: Fransisco HertzBrian L Chen, MD;  [facility];  Service: Vascular;  Laterality: Left;  . Endarterectomy popliteal Left 09/16/2013    Procedure: ENDARTERECTOMYOF LEFT POPLITEAL ARTERY;  Surgeon: Fransisco HertzBrian L Chen, MD;  [facility];  Service: Vascular;  Laterality: Left;   Carotid artery Dopplers: High bifurcations. Less than 40% stenoses bilaterally. Antegrade vertebral flow.   MEDICATIONS AND ALLERGIES REVIEWED IN EPIC No Change in Social and Family History  ROS: A comprehensive Review of Systems - Negative except Postoperative soreness. No significant vision changes. The most notable problem he has now is progressed in her more sense to his inability to help provide for his family. He is trying to come to terms that he may very well need to start looking into disability.  PHYSICAL EXAM BP 113/64  Pulse 78  Ht 5\' 11"  (1.803 m)  Wt 240 lb 4.8 oz (108.999 kg)  BMI 33.53 kg/m2 General appearance: alert, cooperative, appears stated age, no distress and mildly obese Neck: no adenopathy, no carotid bruit, no JVD and supple, symmetrical, trachea midline Lungs: clear to auscultation bilaterally, normal percussion bilaterally and Nonlabored, good air movement Heart: RRR, normal S1 and S2. Possible soft S4. Early peaking 1/6 SEM at the base radiating to carotid. Also 1/6 blowing HSM at left lower sternal border Abdomen: soft, non-tender; bowel sounds normal; no masses,  no organomegaly and Mildly obese Extremities: No clubbing or cyanosis. The left lower leg as varying stages of healing at the op site. Pulses: Still diminished but palpable bilaterally. Neurologic: Grossly normal; CN II through XII grossly intact   Adult ECG Report - not checked  ASSESSMENT /  PLAN: Retinal artery occlusion Unfortunately, we don't have any timeline is when this occurred. He is currently now on Xarelto for A. fib, however he was not diagnosed with A. fib. This is also the setting of known cardiomyopathy. Carotid Dopplers were negative. Therefore this etiology may very well been cardioembolic. He is currently now on aspirin and Xarelto so he should be covered for future events.  Ischemic cardiomyopathy; EF roughly 40% He had improvement of his EF once normal sinus rhythm was restored. No active heart failure symptoms. Will need to monitor for recurrence of A. fib. Currently he is on a stable dose of carvedilol and ramipril. We discussed sliding-scale Lasix taken 1-2 tablets a day as needed based on weights.  Chronic combined systolic and diastolic congestive heart failure, NYHA class 2 Currently he feels well. He looks euvolemic. Continue current regimen. Low-sodium diet and daily weights. Sliding scale Lasix  CAD- CABG x 6 9/08- Myoview 5/13 with scar, no ischemia Nonischemic mid with large scar consistent with 2 wall motion abnormality noted on echo. On stable regimen as noted in the ischemic cardiomyopathy section  PAF (paroxysmal atrial fibrillation) Currently in sinus rhythm. He does not do well when he is in A. Fib. If he has recurrences, would strongly consider antiarrhythmic agent. With his cardiomyopathy this with probably mean either Tikosyn, sotalol or amiodarone.  He is currently anticoagulated on Xarelto.  Hypertension Well-controlled on current regimen. No change  PAD - R Fem-Pop, L SFA stent; recent left popliteal endarterectomy with patch angioplasty Still having problems with slow wound healing.  Somewhat painful. Otherwise hoping that this will help her get back to regular exercise.  Obesity (BMI 30-39.9) Unfortunately his weight is going the wrong direction since his surgery. Is not to do any activity. Hopefully now that he is having  revascularization of his leg he can be back doing some exercise and Dietary modification to lose weight. Another major issue here is that he starting  To have guilt for his ability to perform "as the breadwinner for the family " -- concern that he started to develop some depression symptoms.  Noncompliance His determine nondistended medications 2 cc how sick he can get without them.  Hyperlipidemia Recently switched to atorvastatin. He will need to have labs checked soon. I don't see that he has had labs checked in the computer. I'll wait a few months after his surgery to allow him to recover and hopefully get some exercise and to get an accurate assessment..    No orders of the defined types were placed in this encounter.   Meds ordered this encounter  Medications  . DISCONTD: furosemide (LASIX) 40 MG tablet    Sig: Take 40 mg by mouth 2 (two) times daily.  . furosemide (LASIX) 40 MG tablet    Sig: Take 1 tablet (40 mg total) by mouth 2 (two) times daily.    Dispense:  60 tablet    Refill:  9    Followup: As scheduled in October   Allayah Raineri W. Herbie [place], M.D., M.S. Interventional Cardiologist [facility]

## 2013-11-15 ENCOUNTER — Encounter: Payer: Self-pay | Admitting: Cardiology

## 2013-11-15 DIAGNOSIS — H349 Unspecified retinal vascular occlusion: Secondary | ICD-10-CM | POA: Insufficient documentation

## 2013-11-15 NOTE — Assessment & Plan Note (Signed)
Nonischemic mid with large scar consistent with 2 wall motion abnormality noted on echo. On stable regimen as noted in the ischemic cardiomyopathy section

## 2013-11-15 NOTE — Assessment & Plan Note (Signed)
Still having problems with slow wound healing.  Somewhat painful. Otherwise hoping that this will help her get back to regular exercise.

## 2013-11-15 NOTE — Assessment & Plan Note (Signed)
He had improvement of his EF once normal sinus rhythm was restored. No active heart failure symptoms. Will need to monitor for recurrence of A. fib. Currently he is on a stable dose of carvedilol and ramipril. We discussed sliding-scale Lasix taken 1-2 tablets a day as needed based on weights.

## 2013-11-15 NOTE — Assessment & Plan Note (Signed)
Currently in sinus rhythm. He does not do well when he is in A. Fib. If he has recurrences, would strongly consider antiarrhythmic agent. With his cardiomyopathy this with probably mean either Tikosyn, sotalol or amiodarone. He is currently anticoagulated on Xarelto.

## 2013-11-15 NOTE — Assessment & Plan Note (Signed)
Well-controlled on current regimen.  No change 

## 2013-11-15 NOTE — Assessment & Plan Note (Signed)
His determine nondistended medications 2 cc how sick he can get without them.

## 2013-11-15 NOTE — Assessment & Plan Note (Signed)
Currently he feels well. He looks euvolemic. Continue current regimen. Low-sodium diet and daily weights. Sliding scale Lasix

## 2013-11-15 NOTE — Assessment & Plan Note (Signed)
Unfortunately, we don't have any timeline is when this occurred. He is currently now on Xarelto for A. fib, however he was not diagnosed with A. fib. This is also the setting of known cardiomyopathy. Carotid Dopplers were negative. Therefore this etiology may very well been cardioembolic. He is currently now on aspirin and Xarelto so he should be covered for future events.

## 2013-11-15 NOTE — Assessment & Plan Note (Signed)
Unfortunately his weight is going the wrong direction since his surgery. Is not to do any activity. Hopefully now that he is having revascularization of his leg he can be back doing some exercise and Dietary modification to lose weight. Another major issue here is that he starting  To have guilt for his ability to perform "as the breadwinner for the family " -- concern that he started to develop some depression symptoms.

## 2013-11-15 NOTE — Assessment & Plan Note (Signed)
Recently switched to atorvastatin. He will need to have labs checked soon. I don't see that he has had labs checked in the computer. I'll wait a few months after his surgery to allow him to recover and hopefully get some exercise and to get an accurate assessment.Marland Kitchen

## 2013-11-17 ENCOUNTER — Encounter: Payer: No Typology Code available for payment source | Attending: Internal Medicine | Admitting: *Deleted

## 2013-11-17 ENCOUNTER — Encounter: Payer: Self-pay | Admitting: *Deleted

## 2013-11-17 DIAGNOSIS — E1140 Type 2 diabetes mellitus with diabetic neuropathy, unspecified: Secondary | ICD-10-CM

## 2013-11-17 DIAGNOSIS — Z713 Dietary counseling and surveillance: Secondary | ICD-10-CM | POA: Insufficient documentation

## 2013-11-17 DIAGNOSIS — E119 Type 2 diabetes mellitus without complications: Secondary | ICD-10-CM | POA: Insufficient documentation

## 2013-11-17 DIAGNOSIS — Z9119 Patient's noncompliance with other medical treatment and regimen: Secondary | ICD-10-CM | POA: Insufficient documentation

## 2013-11-17 DIAGNOSIS — IMO0002 Reserved for concepts with insufficient information to code with codable children: Secondary | ICD-10-CM

## 2013-11-17 DIAGNOSIS — E1165 Type 2 diabetes mellitus with hyperglycemia: Secondary | ICD-10-CM

## 2013-11-17 DIAGNOSIS — Z91199 Patient's noncompliance with other medical treatment and regimen due to unspecified reason: Secondary | ICD-10-CM | POA: Insufficient documentation

## 2013-11-17 NOTE — Patient Instructions (Signed)
   Aim for 3 meals each day about every 3-5 hours.  If you're going to go longer to eat your meal, eat a snack in between to tide you over  Aim for each meal to be about the same size and eat at about the same time every.  Aim for 8 hours of sleep each night  Breakfast: 2 slices toast and 2 eggs with cup of milk  Lunch: [place] sandwich with small handful multigrain chips and small fruit  Snack: pack of peanut butter crackers  Dinner: baked pork chop, 1 cup mashed potatoes, 1 cup steamed vegetables

## 2013-11-17 NOTE — Progress Notes (Signed)
Appt start time: 0900 end time:  1030.   Assessment:  Patient was seen on  11/17/13 for individual diabetes education. William Pacheco has been diagnosed with diabetes for 15 years.  He has been seen by an endocrinologist in the past, but is currently working with a primary care provider.  William Pacheco admits he doesn't mention some of his concerns to his doctor because he feels the doctor is too busy.   William Pacheco has a lot of medical problems.  He currently has 2 wounds that are very slow to heal.  He reports that financially he can't afford his medications and has been noncompliant with his medications for months.  He might take his medications  Now, but not according to the instructions.  He has a varied meal and mediation scheduled and goes very long periods of time without eating or taking his medications.  He states he knows what to do to manage his diabetes.  He feels like he's very educated about diabetes management, but he states he doesn't understand why his blood sugars always run high.  To his memory, his HgA1c has always been 10 or above.  He says that most recently, his A1c is 8.2%, but I don't have documentation to support that.  He is currently unemployed and he isn't sure what to do about his future since his wounds wont' heal and his medication instability.    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  Even though he' snot currently working, he is not accustomed to eating on a regular schedule and he's quite ada ment about eating when he feels like it.  Currently he wakes up around 8 am and goes to bed around 1 am.  He is also very stressed about not being employed.  He lives with his wife and daughter who are both very interested in health and wellness.  They have very strong ideas about what is health and what is not.  Unfortunately they have a lot of wrong ideas about health.  ie "use coconut oil instead of olive oil," "margarine is better than butter," "no white foods," etc.    Current HgA1c: 10.00%  Preferred  Learning Style:   Visual  Learning Readiness:   Contemplating   MEDICATIONS: see list.  Lantus 50u at night for diabetes  DIETARY INTAKE:  Usual eating pattern includes 2 meals and 0 snacks per day.  Everyday foods include proteins, starch.  Avoided foods include none.    24-hr recall:  B ( AM): 3 cups coffee with creamer  Snk ( AM): not really  L ( PM): 1-2 pm: couple eggs and toast Snk ( PM): not really D ( PM): eats late: tossed salad with dressing, meat, and possible rice and beans Snk ( PM): not really Beverages: water, coffee, 1% milk  Usual physical activity: not active due to wound on leg  Estimated energy needs: 2000 calories 225 g carbohydrates 150 g protein 56 g fat  Progress Towards Goal(s):  In progress.   Nutritional Diagnosis:  NB-1.1 Food and nutrition-related knowledge deficit As related to proper balance of fats, carbohydrates, and proteins needed for maintaining blood glucose levels.  As evidenced by HgA1c 10.0%.    Intervention:  Nutrition counseling provided.  Discussed diabetes disease process and treatment options.    Provided education on macronutrients on glucose levels.  Provided education on carb counting, importance of regularly scheduled meals/snacks, and meal planning  Discussed effects of physical activity on glucose levels and long-term glucose control.  Recommended chair exercises  Reviewed patient medications.  Discussed role of medication on blood glucose and possible side effects    Teaching Method Utilized:  Visual Auditory Hands on  Handouts given during visit include:  Meal plan card  Barriers to learning/adherence to lifestyle change: multiple medical conditions, family members with wrong nutrition information  Diabetes self-care support plan:  family  Demonstrated degree of understanding via:  Teach Back   Monitoring/Evaluation:  Dietary intake, exercise, BGM, and body weight in 1 month(s).

## 2013-11-19 ENCOUNTER — Encounter: Payer: Self-pay | Admitting: Vascular Surgery

## 2013-11-20 ENCOUNTER — Ambulatory Visit (INDEPENDENT_AMBULATORY_CARE_PROVIDER_SITE_OTHER): Payer: Self-pay | Admitting: Vascular Surgery

## 2013-11-20 ENCOUNTER — Encounter: Payer: Self-pay | Admitting: Vascular Surgery

## 2013-11-20 VITALS — BP 141/85 | HR 63 | Ht 71.0 in | Wt 240.0 lb

## 2013-11-20 DIAGNOSIS — I70219 Atherosclerosis of native arteries of extremities with intermittent claudication, unspecified extremity: Secondary | ICD-10-CM

## 2013-11-20 DIAGNOSIS — Z48812 Encounter for surgical aftercare following surgery on the circulatory system: Secondary | ICD-10-CM

## 2013-11-20 DIAGNOSIS — Z5189 Encounter for other specified aftercare: Secondary | ICD-10-CM

## 2013-11-20 DIAGNOSIS — I739 Peripheral vascular disease, unspecified: Secondary | ICD-10-CM

## 2013-11-20 NOTE — Progress Notes (Signed)
   Postoperative Visit   History of Present Illness  William Pacheco is a 63 y.o. year old male who presents for postoperative follow-up for:  1. Left groin, above-the-knee and below-the-knee popliteal artery exploration 2. Left below-the-knee popliteal artery endarterectomy with bovine patch angioplasty 3. Open cannulation of left common femoral artery  4. Left leg runoff (Date: 09/17/13). Pt returns for wound check today  For VQI Use Only  PRE-ADM LIVING: Home  AMB STATUS: Ambulatory  Physical Examination  Filed Vitals:   11/20/13 0932  BP: 141/85  Pulse: 63  Height: 5\' 11"  (1.803 m)  Weight: 240 lb (108.863 kg)  SpO2: 99%   LUE: all incisions have healed except for ~1.5 cm area of eschar in the mid-calf, Left heel has healed  Medical Decision Making  William Pacheco is a 63 y.o. year old male who presents s/p L popliteal exploration, L BK pop EA with BPA, short distance lifestyle limiting intermittent claudication  The patient's bypass incisions are healing appropriately with resolution of pre-operative symptoms. I discussed in depth with the patient the nature of atherosclerosis, and emphasized the importance of maximal medical management including strict control of blood pressure, blood glucose, and lipid levels, obtaining regular exercise, and cessation of smoking. The patient is aware that without maximal medical management the underlying atherosclerotic disease process will progress, limiting the benefit of any interventions. Patient will continued BLE ABI in 3 months. At this point, the plan is maximal management and walking plan. Thank you for allowing [place] to participate in this patient's care.   [name], MD [facility] Office: [phone] Pager: [phone]  11/20/2013, 5:22 PM

## 2013-11-24 ENCOUNTER — Telehealth: Payer: Self-pay | Admitting: Cardiology

## 2013-11-24 NOTE — Telephone Encounter (Signed)
LEFT MESSAGE TO CALL BACK

## 2013-11-24 NOTE — Telephone Encounter (Signed)
Please call,his Furosemide 40 mg is not helping,He wants to know if he can increase it?

## 2013-11-25 NOTE — Telephone Encounter (Signed)
Patient thinks he needs to increase his furosemide.  Already takes 40mg  bid but states he is gaining weight.  No swelling of his feet or ankles and also not c/o SOB.  States his knees are swollen and is scheduled for an injection next week to the right knee.  Suggested he speak to his ortho next week to see if swelling of knees is due to ortho reasons - doesn't sound like he is retaining fluid (no edema or SOB).  Patient voiced understanding and call if there is anything we can do for him.

## 2013-12-14 DIAGNOSIS — Z0279 Encounter for issue of other medical certificate: Secondary | ICD-10-CM

## 2014-01-04 ENCOUNTER — Encounter: Payer: No Typology Code available for payment source | Attending: Internal Medicine | Admitting: *Deleted

## 2014-01-04 DIAGNOSIS — IMO0002 Reserved for concepts with insufficient information to code with codable children: Secondary | ICD-10-CM

## 2014-01-04 DIAGNOSIS — E119 Type 2 diabetes mellitus without complications: Secondary | ICD-10-CM | POA: Insufficient documentation

## 2014-01-04 DIAGNOSIS — E1140 Type 2 diabetes mellitus with diabetic neuropathy, unspecified: Secondary | ICD-10-CM

## 2014-01-04 DIAGNOSIS — Z713 Dietary counseling and surveillance: Secondary | ICD-10-CM | POA: Insufficient documentation

## 2014-01-04 DIAGNOSIS — E1165 Type 2 diabetes mellitus with hyperglycemia: Secondary | ICD-10-CM

## 2014-01-04 DIAGNOSIS — Z9119 Patient's noncompliance with other medical treatment and regimen: Secondary | ICD-10-CM | POA: Insufficient documentation

## 2014-01-04 DIAGNOSIS — Z91199 Patient's noncompliance with other medical treatment and regimen due to unspecified reason: Secondary | ICD-10-CM | POA: Insufficient documentation

## 2014-01-04 NOTE — Progress Notes (Signed)
Appt start time: 0900 end time:  0930.   Assessment:  Added Invokana and started taking Novolog with meals 10 U.   His fasting glucose readings are lower.  This morning it was 124 mg/dl.  He has applied for disability due to his leg.  His wounds have finally healed, but he's not able to walk as well.  He can't work and he's struggling financially and not having a purpose.  He didn't realize he wasn't supposed to be taking the bolus insulin and he was taking the wrong dose of his lantus.  He has made no dietary changes, nor increase his activity level.  He is in kind of a depressed state because he's not working, not active, and not making any money     Current HgA1c: 10.00%  Preferred Learning Style:   Visual  Learning Readiness:   Contemplating   MEDICATIONS: see list.  Lantus 50u at night for diabetes  DIETARY INTAKE:  Usual eating pattern includes 2 meals and 0 snacks per day.  Everyday foods include proteins, starch.  Avoided foods include none.    24-hr recall:  B ( AM): 3 cups coffee with creamer.  Juices lemon, beets, celery, carrots, apples 3-4 times a week. (2 cups worth) bid Snk ( AM): not really  L ( PM): 1-2 pm: couple eggs and toast Snk ( PM): not really D ( PM): eats late: tossed salad with dressing, meat, and possible rice and beans Snk ( PM): not really Beverages: water, coffee, 1% milk  Usual physical activity: not active due to wound on leg  Estimated energy needs: 2000 calories 225 g carbohydrates 150 g protein 56 g fat  Progress Towards Goal(s):  In progress.   Nutritional Diagnosis:  NB-1.1 Food and nutrition-related knowledge deficit As related to proper balance of fats, carbohydrates, and proteins needed for maintaining blood glucose levels.  As evidenced by HgA1c 10.0%.    Intervention:  Nutrition counseling provided. Goals: Check blood sugar 2 hours after a meal each day.  Keep a log of your numbers and take it to your doctor Aim for 3 meal  each day. Each meal to contain protein like eggs, peanuts, deli meat, tuna,  Use any site on your body that has fatty tissue for your insulin injection Attempt couch exercises or pool exercises   Teaching Method Utilized:  Visual Auditory   Handouts given during visit include:  Meal plan card  [employer] scholarship application  Armchair exercises  Barriers to learning/adherence to lifestyle change: multiple medical conditions, family members with wrong nutrition information  Diabetes self-care support plan:  family  Demonstrated degree of understanding via:  Teach Back   Monitoring/Evaluation:  Dietary intake, exercise, BGM, and body weight in 3 month(s).

## 2014-01-04 NOTE — Patient Instructions (Addendum)
Check blood sugar 2 hours after a meal each day.  Keep a log of your numbers and take it to your doctor Aim for 3 meal each day. Each meal to contain protein like eggs, peanuts, deli meat, tuna,  Use any site on your body that has fatty tissue for your insulin injection Attempt couch exercises or pool exercises

## 2014-01-14 ENCOUNTER — Telehealth: Payer: Self-pay | Admitting: Cardiology

## 2014-01-14 NOTE — Telephone Encounter (Signed)
Returned call to patient Dr.Harding advised ok for cardiac rehab.Message sent to Gladstone Lighter RN at Cardiac Rehab.Advised she will let us know if your insurance will cover.

## 2014-01-14 NOTE — Telephone Encounter (Signed)
Returned call to patient he stated he would like to go to cardiac rehab.Dr.Harding out of office this week,will send message to him for a order.

## 2014-01-14 NOTE — Telephone Encounter (Signed)
Pt is interested in getting in the Cardiac Rehab program.He wants to know if he qualifies for it and if his insurance will pay for it?

## 2014-01-14 NOTE — Telephone Encounter (Signed)
I would love to see if we can get him in.

## 2014-02-02 ENCOUNTER — Encounter: Payer: Self-pay | Admitting: Podiatry

## 2014-02-02 ENCOUNTER — Ambulatory Visit (INDEPENDENT_AMBULATORY_CARE_PROVIDER_SITE_OTHER): Payer: No Typology Code available for payment source | Admitting: Podiatry

## 2014-02-02 ENCOUNTER — Telehealth (HOSPITAL_COMMUNITY): Payer: Self-pay | Admitting: *Deleted

## 2014-02-02 DIAGNOSIS — B351 Tinea unguium: Secondary | ICD-10-CM

## 2014-02-02 DIAGNOSIS — M79676 Pain in unspecified toe(s): Secondary | ICD-10-CM

## 2014-02-02 DIAGNOSIS — M79609 Pain in unspecified limb: Secondary | ICD-10-CM

## 2014-02-02 NOTE — Progress Notes (Signed)
He presents today chief complaint of painful elongated toenails one through 5 bilateral.  Objective: Vital signs are stable he is alert and oriented x3. Pulses are strongly palpable bilateral nails are thick yellow dystrophic onychomycotic and painful palpation.  Assessment: Pain in limb secondary to onychomycosis 1 through 5 bilateral.  Plan:

## 2014-02-15 IMAGING — CT CT ANGIO CHEST
2 of 6 series · 18 of 46 positions shown · IV contrast (APPLIED)
Comparison: Chest radiograph performed earlier today at [DATE] a.m.

CLINICAL DATA: Shortness of breath and chest congestion.  Right-
sided neck tightness.

CT ANGIOGRAPHY CHEST
TECHNIQUE: Multidetector CT imaging of the chest using the
standard protocol during bolus administration of intravenous
contrast. Multiplanar reconstructed images including MIPs were
obtained and reviewed to evaluate the vascular anatomy.
Contrast: 100mL OMNIPAQUE IOHEXOL 350 MG/ML IV SOLN

[Series 7: pulm embolism 1.0 b25f thin · axial · 0.72mm/px · z∈[+8,+282]mm · 15 of 301 slices shown]
[im 14/301  lung]
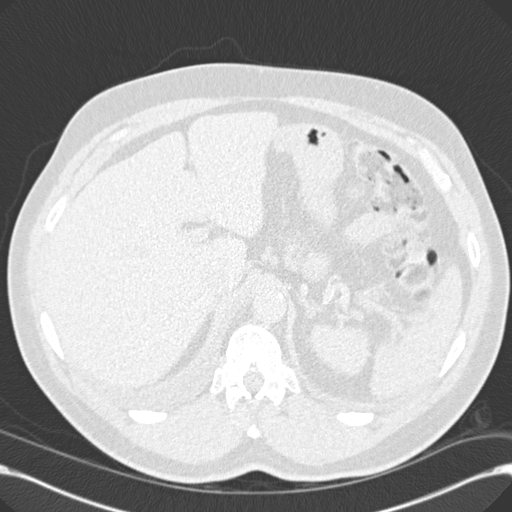
[im 40/301  soft-tissue]
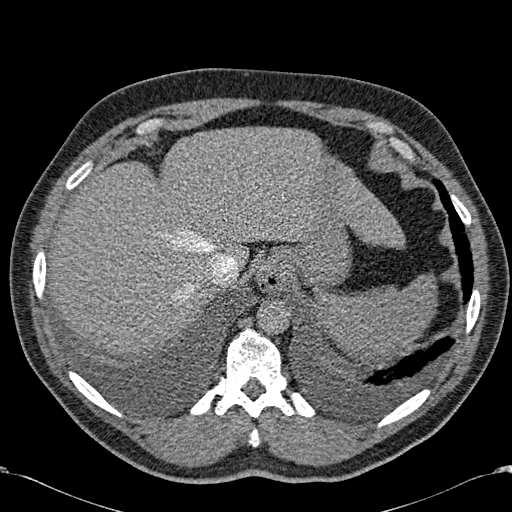
[im 53/301  lung]
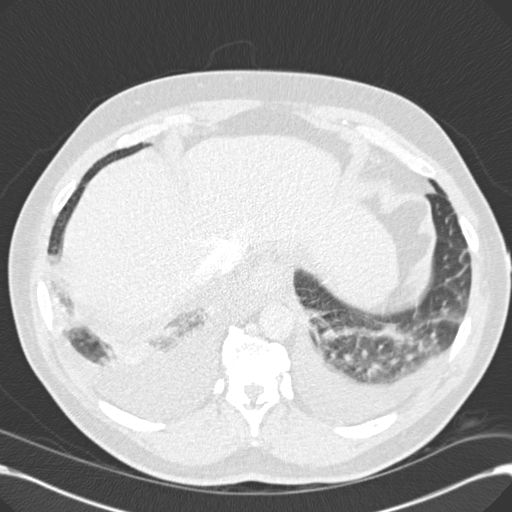
[im 79/301  soft-tissue]
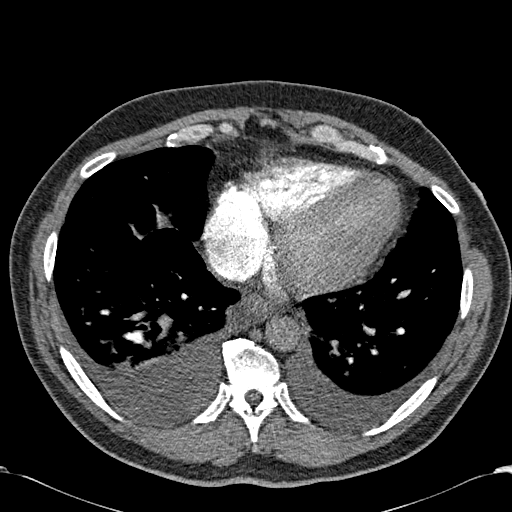
[im 92/301  lung]
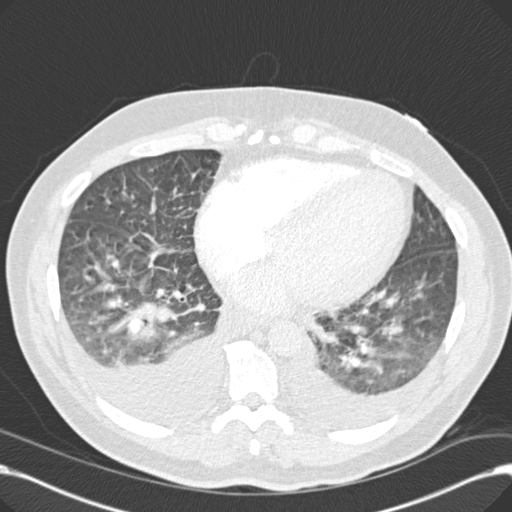
[im 118/301  soft-tissue]
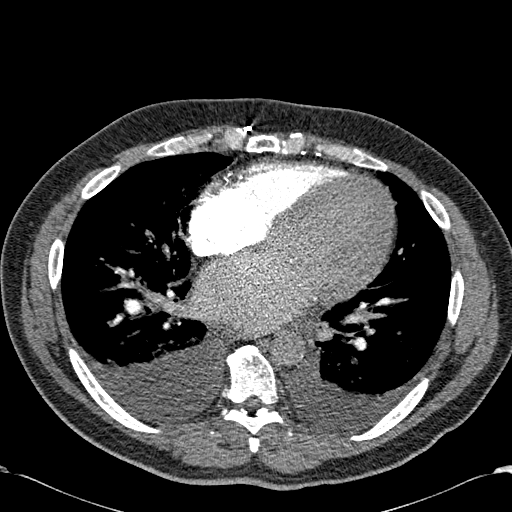
[im 131/301  lung]
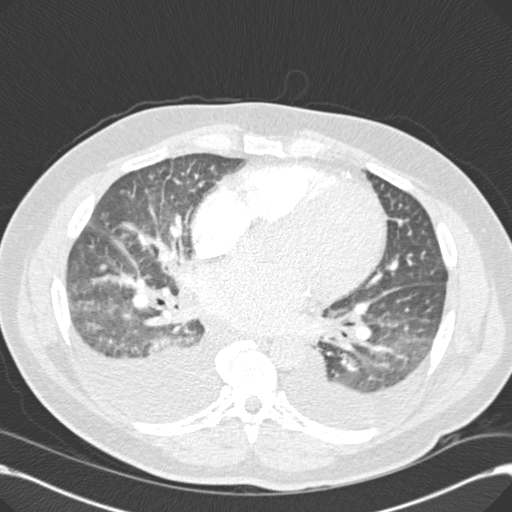
[im 157/301  soft-tissue]
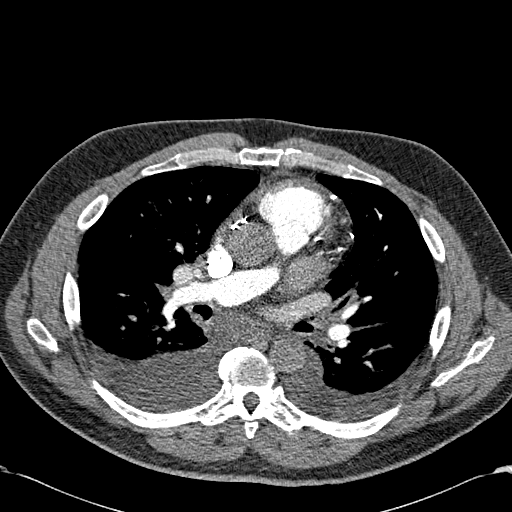
[im 170/301  lung]
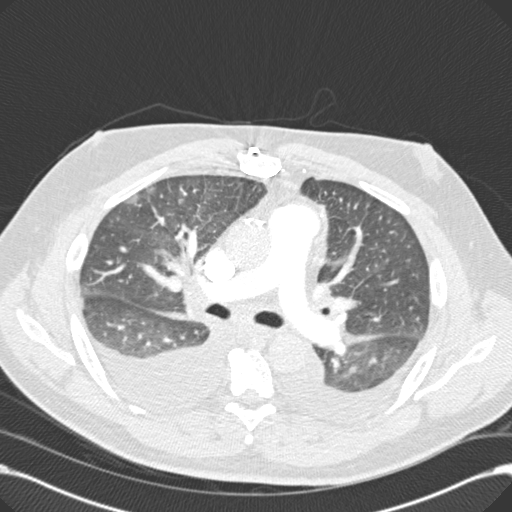
[im 183/301  soft-tissue]
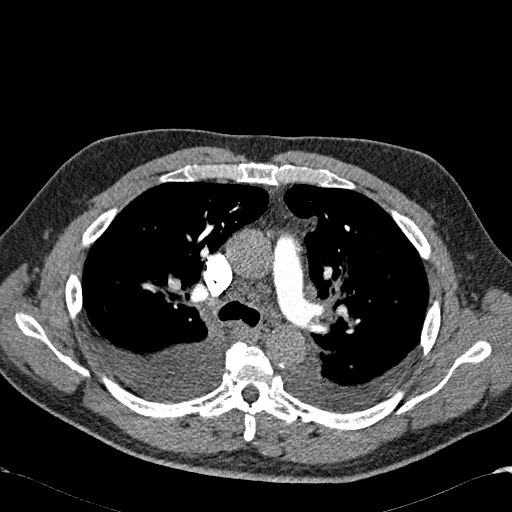
[im 209/301  lung]
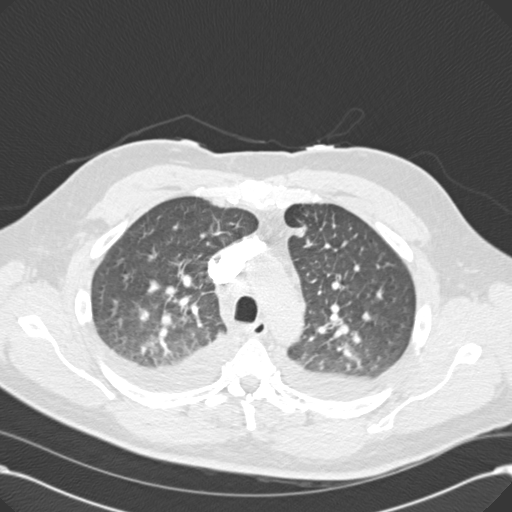
[im 222/301  soft-tissue]
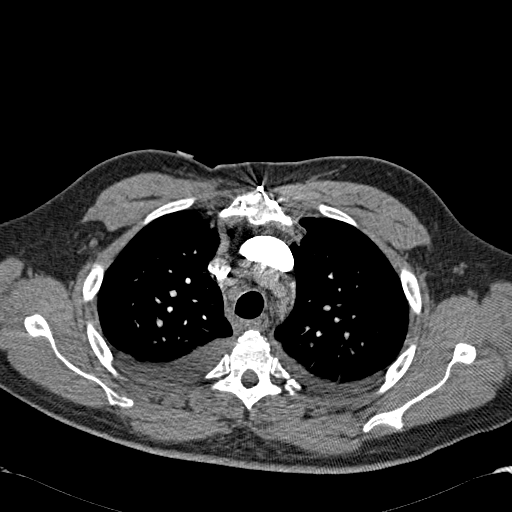
[im 248/301  lung]
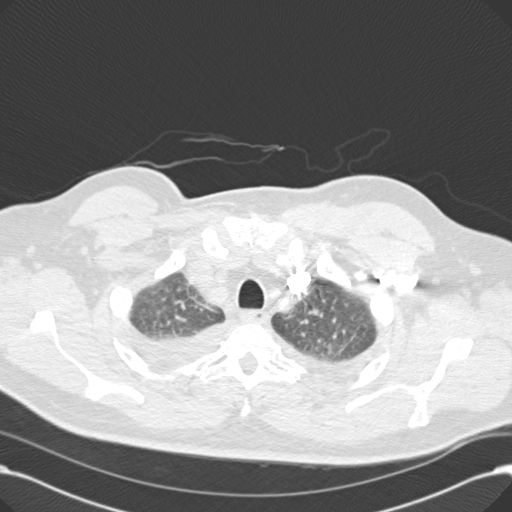
[im 261/301  soft-tissue]
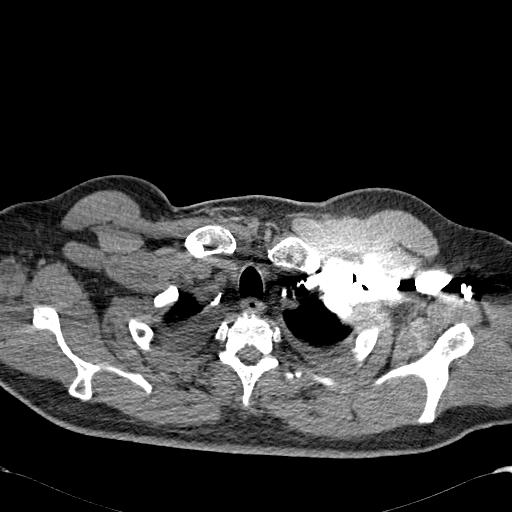
[im 287/301  lung]
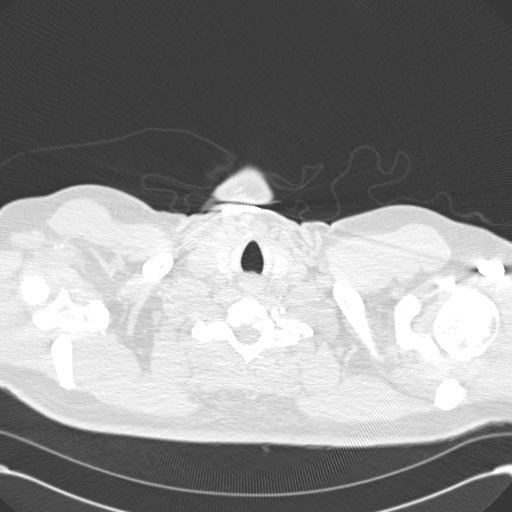

[Series 602: coronals · coronal · 0.72mm/px · 3 of 117 slices shown]
[im 30/117  soft-tissue]
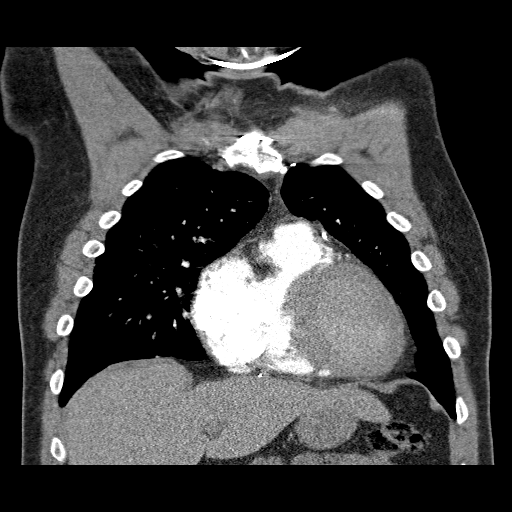
[im 59/117  soft-tissue]
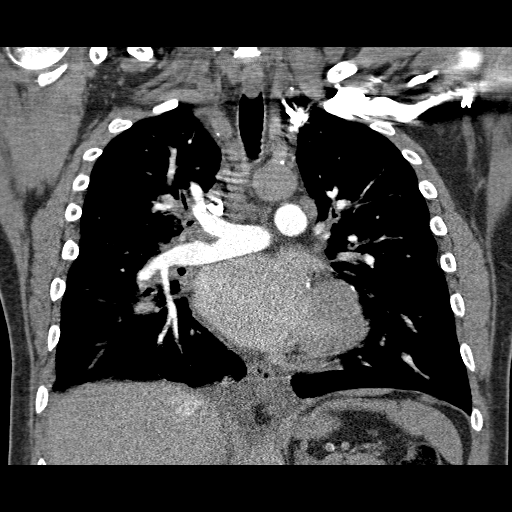
[im 88/117  soft-tissue]
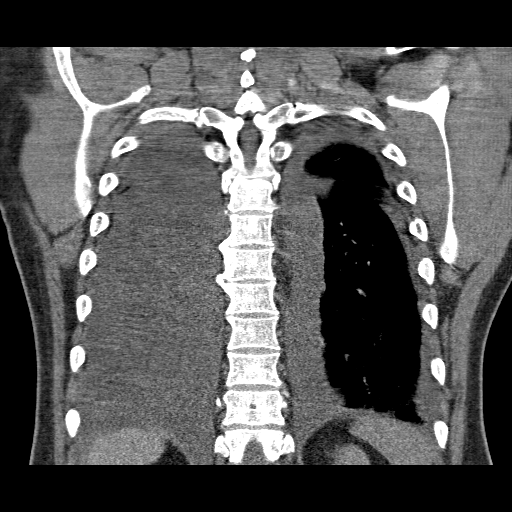

[18 of 46 positions shown; findings below may reference images not displayed]

FINDINGS: There is no evidence of significant pulmonary embolus.

Small bilateral pleural effusions are noted, right greater than
left.  Diffuse interstitial prominence is seen, with bibasilar
airspace opacities, compatible with pulmonary edema.  There is no
evidence of significant pneumothorax.  No masses are identified; no
abnormal focal contrast enhancement is seen.

Prominent mediastinal and right hilar nodes are seen, measuring up
to 1.7 cm in size; enlarged precarinal, aortopulmonary window,
periaortic and right paratracheal nodes are noted.  Diffuse
coronary artery calcifications are seen.

No pericardial effusion is identified.  The great vessels are
grossly unremarkable in appearance.  The patient is status post
median sternotomy.  No axillary lymphadenopathy is seen.  The
thyroid gland is unremarkable in appearance.

The visualized portions of the liver and spleen are unremarkable.
The visualized portions of the pancreas, gallbladder, stomach,
adrenal glands and left kidney are within normal limits.  There is
reflux of contrast into the IVC and hepatic veins.

No acute osseous abnormalities are seen.  Anterior bridging
osteophytes are noted along the lower thoracic spine.
IMPRESSION: 1.  No evidence of significant pulmonary embolus.
2.  Small bilateral pleural effusions, right greater than left,
with diffuse interstitial prominence and bibasilar airspace
opacities, compatible with pulmonary edema.
3.  Diffusely enlarged mediastinal and right hilar nodes, measuring
up to 1.7 cm in size; these are of uncertain etiology.
4.  Incidental reflux of contrast into the IVC and hepatic veins.

## 2014-02-18 ENCOUNTER — Encounter: Payer: Self-pay | Admitting: Vascular Surgery

## 2014-02-19 ENCOUNTER — Encounter: Payer: Self-pay | Admitting: Vascular Surgery

## 2014-02-19 ENCOUNTER — Ambulatory Visit (HOSPITAL_COMMUNITY)
Admission: RE | Admit: 2014-02-19 | Discharge: 2014-02-19 | Disposition: A | Discharge status: Home / Self Care | Payer: No Typology Code available for payment source | Source: Ambulatory Visit | Attending: Vascular Surgery | Admitting: Vascular Surgery

## 2014-02-19 ENCOUNTER — Ambulatory Visit (INDEPENDENT_AMBULATORY_CARE_PROVIDER_SITE_OTHER): Payer: No Typology Code available for payment source | Admitting: Vascular Surgery

## 2014-02-19 VITALS — BP 116/71 | HR 58 | Resp 16 | Ht 71.0 in | Wt 248.0 lb

## 2014-02-19 DIAGNOSIS — Z48812 Encounter for surgical aftercare following surgery on the circulatory system: Secondary | ICD-10-CM | POA: Diagnosis present

## 2014-02-19 DIAGNOSIS — I739 Peripheral vascular disease, unspecified: Secondary | ICD-10-CM | POA: Diagnosis not present

## 2014-02-19 DIAGNOSIS — I70219 Atherosclerosis of native arteries of extremities with intermittent claudication, unspecified extremity: Secondary | ICD-10-CM

## 2014-02-19 NOTE — Progress Notes (Signed)
    Established Intermittent Claudication  History of Present Illness  William Pacheco is a 63 y.o. (02-Jul-1951) male who presents with chief complaint: continued short distance left leg claudication.  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  The patient's symptoms have not progressed.  The patient's symptoms are: intermittent claudication.  The patient's treatment regimen currently included: maximal medical management and walk plan.  The patient is having problems complying with walking plan due to the short distance before get sx.  The patient's PMH, PSH, SH, FamHx, Med, and Allergies are unchanged from 11/20/13.  On ROS today: continue intermittent claudication , no foot ulcers or gangrene, no rest pain  Physical Examination  Filed Vitals:   02/19/14 1011  BP: 116/71  Pulse: 58  Resp: 16  Height: 5\' 11"  (1.803 m)  Weight: 248 lb (112.492 kg)  SpO2: 100%   Body mass index is 34.6 kg/(m^2).  General: A&O x 3, WDWN  Pulmonary: Sym exp, good air movt, CTAB, no rales, rhonchi, & wheezing  Cardiac: RRR, Nl S1, S2, no Murmurs, rubs or gallops  Vascular: Vessel Right Left  Radial Palpable Palpable  Brachial Palpable Palpable  Carotid Palpable, without bruit Palpable, without bruit  Aorta Not palpable N/A  Femoral Palpable Palpable  Popliteal Not palpable Not palpable  PT Faintly Palpable Not Palpable  DP Not Palpable Not Palpable   Gastrointestinal: soft, NTND, -G/R, - HSM, - masses, - CVAT B  Musculoskeletal: M/S 5/5 throughout , Extremities without ischemic changes , no ulcers or gangrene in either feet  Neurologic: Pain and light touch intact in extremities , Motor exam as listed above  Non-Invasive Vascular Imaging ABI (Date: 02/19/2014)  R: 0.79 (0.75), DP: mono, PT: bi, TBI: 0.49  L: 0.39 (0.40), DP: mono, PT: mono, TBI: 0     Medical Decision Making  William Pacheco is a 63 y.o. male who presents with:  left leg  intermittent claudication without evidence of critical limb ischemia.  Based on the patient's vascular studies and examination, I have offered the patient: q3 ABI.  I discussed in depth with the patient the nature of atherosclerosis, and emphasized the importance of maximal medical management including strict control of blood pressure, blood glucose, and lipid levels, antiplatelet agents, obtaining regular exercise, and cessation of smoking.    The patient is aware that without maximal medical management the underlying atherosclerotic disease process will progress, limiting the benefit of any interventions. The patient is currently on a statin: Lipitor. The patient is currently on an anti-platelet: ASA.  Thank you for allowing [place] to participate in this patient's care.  [name], MD [facility] Office: [phone] Pager: [phone]  02/19/2014, 10:55 AM

## 2014-02-19 NOTE — Patient Instructions (Signed)
Exercise to Stay Healthy Exercise helps you become and stay healthy. EXERCISE IDEAS AND TIPS Choose exercises that:  You enjoy.  Fit into your day. You do not need to exercise really hard to be healthy. You can do exercises at a slow or medium level and stay healthy. You can:  Stretch before and after working out.  Try yoga, Pilates, or tai chi.  Lift weights.  Walk fast, swim, jog, run, climb stairs, bicycle, dance, or rollerskate.  Take aerobic classes. Exercises that burn about 150 calories:  Running 1  miles in 15 minutes.  Playing volleyball for 45 to 60 minutes.  Washing and waxing a car for 45 to 60 minutes.  Playing touch football for 45 minutes.  Walking 1  miles in 35 minutes.  Pushing a stroller 1  miles in 30 minutes.  Playing basketball for 30 minutes.  Raking leaves for 30 minutes.  Bicycling 5 miles in 30 minutes.  Walking 2 miles in 30 minutes.  Dancing for 30 minutes.  Shoveling snow for 15 minutes.  Swimming laps for 20 minutes.  Walking up stairs for 15 minutes.  Bicycling 4 miles in 15 minutes.  Gardening for 30 to 45 minutes.  Jumping rope for 15 minutes.  Washing windows or floors for 45 to 60 minutes. Document Released: 07/21/2010 Document Revised: 09/10/2011 Document Reviewed: 07/21/2010 [facility] Patient Information 2015 [place], [place]. This information is not intended to replace advice given to you by your health care provider. Make sure you discuss any questions you have with your health care provider.  Peripheral vascular disease (PVD) is caused by cholesterol buildup in the arteries. The arteries become narrow or clogged. This makes it hard for blood to flow. It happens most in the legs, but it can occur in other areas of your body. HOME CARE   Quit smoking, if you smoke.  Exercise as told by your doctor.  Follow a low-fat, low-cholesterol diet as told by your doctor.  Control your diabetes, if you have  diabetes.  Care for your feet to prevent infection.  Only take medicine as told by your doctor. GET HELP RIGHT AWAY IF:   You have pain or lose feeling (numbness) in your arms or legs.  Your arms or legs turn cold or blue.  You have redness, warmth, and puffiness (swelling) in your arms or legs. MAKE SURE YOU:   Understand these instructions.  Will watch your condition.  Will get help right away if you are not doing well or get worse. Document Released: 09/12/2009 Document Revised: 09/10/2011 Document Reviewed: 09/12/2009 [facility] Patient Information 2015 [place], [place]. This information is not intended to replace advice given to you by your health care provider. Make sure you discuss any questions you have with your health care provider. Exercise Guidelines During Cardiac Rehabilitation During cardiac rehabilitation, it is important to do more aerobic activities. Even simple lifestyle changes can help, such as parking farther from the store or taking the stairs instead of the elevator.  Discuss an appropriate exercise program with your cardiologist and rehabilitation therapist. It is important to design a program that is safe and effective for you. The program should meet your specific abilities and needs. Walking, biking, jogging, and swimming are all good aerobic activities. These take light to moderate effort. Adding some light resistance training is also important.  STRETCHING Stretching before you exercise warms up your muscles and prevents injury. Stretching also improves your flexibility, balance, coordination, and range of motion.  Stretch both before  and after exercising.  Do not force a muscle or joint into a painful angle. Stretching should be a relaxing part of your exercise routine.  As soon as you feel resistance, hold the position and count to 10.  Go slowly when doing all stretches. TYPES OF EXERCISE Aerobic Exercise Aerobic exercise keeps joints and muscles  moving. It involves large muscle groups. It is also rhythmic in nature and done for a longer period. Doing these exercises improves circulation and endurance. Examples of aerobic exercise include:  Swimming.  Walking.  Hiking.  Jogging.  Cross-country skiing.  Biking. Static Exercise Static exercise uses muscles at high intensities without moving the joints. Pushing against a heavy couch that does not move is an example of static exercise. Static exercise improves strength but also quickly increases blood pressure. Therefore, you need to follow these guidelines:  Do not hold your breath while doing static exercises. Holding your breath during static exercises can raise your blood pressure to a dangerously high level.  Do not do static exercises if you have circulation problems or high blood pressure. Weight-Resistance Exercise Weight-resistance exercises are another important part of rehabilitation. These exercises strengthen your muscles by making them work against resistance. Examples include using free weights, weight-lifting machines, and large, specially designed rubber bands. Resistance exercises may help you return to activities of daily living sooner and improve your quality of life. They also help reduce cardiac risk factors. You will usually do weight-resistance exercises twice a week with a 2-day rest period between workouts. SETTING A PACE   Choose a pace that is comfortable for you. You should be able to talk with a friend while exercising. Slow down if you are short of breath or unable to speak while you exercise.  Keep track of how hard you are working as you exercise. Your rehabilitation therapist can teach you to use a scale to measure your level of exertion. Use this scale to make sure you are exercising at a level that is safe and effective for you. Cardiac rehabilitation and wellness facilities use numerical scales to rate your level of exertion. These scales usually rate  your exertion level in a range from 6 to 20. A rating of 6 means that you are not exerting yourself at all. A rating of 20 indicates that you are working very hard. For a healthy exercise session, your exertion rate goal should be between 11 and 15. Document Released: 06/23/2013 Document Revised: 11/02/2013 Document Reviewed: 06/23/2013 [facility] Patient Information 2015 [place], [place]. This information is not intended to replace advice given to you by your health care provider. Make sure you discuss any questions you have with your health care provider. Peripheral Vascular Disease  Peripheral vascular disease (PVD) is caused by cholesterol buildup in the arteries. The arteries become narrow or clogged. This makes it hard for blood to flow. It happens most in the legs, but it can occur in other areas of your body. HOME CARE   Quit smoking, if you smoke.  Exercise as told by your doctor.  Follow a low-fat, low-cholesterol diet as told by your doctor.  Control your diabetes, if you have diabetes.  Care for your feet to prevent infection.  Only take medicine as told by your doctor. GET HELP RIGHT AWAY IF:   You have pain or lose feeling (numbness) in your arms or legs.  Your arms or legs turn cold or blue.  You have redness, warmth, and puffiness (swelling) in your arms or legs. MAKE  SURE YOU:   Understand these instructions.  Will watch your condition.  Will get help right away if you are not doing well or get worse. Document Released: 09/12/2009 Document Revised: 09/10/2011 Document Reviewed: 09/12/2009 [facility] Patient Information 2015 [place], [place]. This information is not intended to replace advice given to you by your health care provider. Make sure you discuss any questions you have with your health care provider.

## 2014-03-11 ENCOUNTER — Encounter: Payer: Self-pay | Admitting: Cardiology

## 2014-04-06 ENCOUNTER — Encounter: Payer: No Typology Code available for payment source | Attending: Internal Medicine | Admitting: *Deleted

## 2014-04-06 DIAGNOSIS — Z794 Long term (current) use of insulin: Secondary | ICD-10-CM | POA: Insufficient documentation

## 2014-04-06 DIAGNOSIS — Z713 Dietary counseling and surveillance: Secondary | ICD-10-CM | POA: Insufficient documentation

## 2014-04-06 DIAGNOSIS — E1165 Type 2 diabetes mellitus with hyperglycemia: Secondary | ICD-10-CM | POA: Insufficient documentation

## 2014-04-06 DIAGNOSIS — E1140 Type 2 diabetes mellitus with diabetic neuropathy, unspecified: Secondary | ICD-10-CM | POA: Insufficient documentation

## 2014-04-13 ENCOUNTER — Encounter: Payer: No Typology Code available for payment source | Admitting: *Deleted

## 2014-04-13 DIAGNOSIS — Z794 Long term (current) use of insulin: Secondary | ICD-10-CM | POA: Diagnosis not present

## 2014-04-13 DIAGNOSIS — E1140 Type 2 diabetes mellitus with diabetic neuropathy, unspecified: Secondary | ICD-10-CM

## 2014-04-13 DIAGNOSIS — E1165 Type 2 diabetes mellitus with hyperglycemia: Secondary | ICD-10-CM | POA: Diagnosis not present

## 2014-04-13 DIAGNOSIS — IMO0002 Reserved for concepts with insufficient information to code with codable children: Secondary | ICD-10-CM

## 2014-04-13 DIAGNOSIS — Z713 Dietary counseling and surveillance: Secondary | ICD-10-CM | POA: Diagnosis not present

## 2014-04-13 NOTE — Patient Instructions (Signed)
Goals: Check blood sugar 2 hours after a meal each day.  Keep a log of your numbers and take it to your doctor Aim for 3 meal each day. Each meal to contain protein like eggs, peanuts, deli meat, tuna,  Use any site on your body that has fatty tissue for your insulin injection Attempt couch exercises or pool exercises

## 2014-04-13 NOTE — Progress Notes (Signed)
Appt start time: 1400 end time:  1430.   Assessment:  William Pacheco is here for follow up diabetes self management education.   His fasting glucose readings are lower.  This morning it was 124 mg/dl.  He has applied for disability due to his leg.  His wounds have finally healed, but he's not able to walk as well.  He can't work and he's struggling financially and not having a purpose. He has made no dietary changes, nor increase his activity level.  He is in kind of a depressed state because he's not working, not active, and not making any money     Current HgA1c: 10.00%  Preferred Learning Style:   Visual  Learning Readiness:   Contemplating   MEDICATIONS: see list.  Lantus 50u at night for diabetes, Novolog 20u bid  DIETARY INTAKE:  Usual eating pattern includes 2 meals and 0 snacks per day.  Everyday foods include proteins, starch.  Avoided foods include none.    24-hr recall:  B ( AM): 3 cups coffee with creamer.  Snk ( AM): not really  L ( PM): 1-2 pm: couple eggs and maybe toast Snk ( PM): not really D ( PM): eats late: tossed salad with dressing, meat, and possible rice and beans Snk ( PM): not really Beverages: water, coffee, 1% milk  Usual physical activity: not active due to wound on leg  Estimated energy needs: 2000 calories 225 g carbohydrates 150 g protein 56 g fat  Progress Towards Goal(s):  In progress.   Nutritional Diagnosis:  NB-1.1 Food and nutrition-related knowledge deficit As related to proper balance of fats, carbohydrates, and proteins needed for maintaining blood glucose levels.  As evidenced by HgA1c 10.0%.    Intervention:  Nutrition counseling provided.  Discussed insulin delivery options: pumps, inhalation Goals: Check blood sugar 2 hours after a meal each day.  Keep a log of your numbers and take it to your doctor Aim for 3 meal each day. Each meal to contain protein like eggs, peanuts, deli meat, tuna,  Use any site on your body that has fatty  tissue for your insulin injection Attempt couch exercises or pool exercises   Teaching Method Utilized:  Auditory   Barriers to learning/adherence to lifestyle change: multiple medical conditions, family members with wrong nutrition information  Diabetes self-care support plan:  family  Demonstrated degree of understanding via:  Teach Back   Monitoring/Evaluation:  Dietary intake, exercise, BGM, and body weight in 6 month(s).

## 2014-05-06 ENCOUNTER — Ambulatory Visit: Payer: No Typology Code available for payment source | Admitting: Podiatry

## 2014-05-06 ENCOUNTER — Ambulatory Visit (INDEPENDENT_AMBULATORY_CARE_PROVIDER_SITE_OTHER): Payer: No Typology Code available for payment source | Admitting: Podiatry

## 2014-05-06 DIAGNOSIS — M79673 Pain in unspecified foot: Secondary | ICD-10-CM

## 2014-05-06 DIAGNOSIS — M79674 Pain in right toe(s): Secondary | ICD-10-CM

## 2014-05-06 DIAGNOSIS — B351 Tinea unguium: Secondary | ICD-10-CM

## 2014-05-06 NOTE — Progress Notes (Signed)
   Subjective:    Patient ID: William Pacheco, male    DOB: [birth year], 63 y.o.   MRN: [id number]  HPI  Pt is here for nail debridement   Review of Systems     Objective:   Physical Exam:pulses are palpable bilateral. Nails are thick yellow dystrophic likely mycotic and painful palpation as well as debridement.        Assessment & Plan:  Assessment: Pain in limb secondary to onychomycosis 1 through 5 bilateral.  Plan: Debridement of nails 1 through 5 bilateral.

## 2014-06-03 ENCOUNTER — Encounter: Payer: Self-pay | Admitting: Family

## 2014-06-04 ENCOUNTER — Encounter: Payer: Self-pay | Admitting: Family

## 2014-06-04 ENCOUNTER — Ambulatory Visit (HOSPITAL_COMMUNITY)
Admission: RE | Admit: 2014-06-04 | Discharge: 2014-06-04 | Disposition: A | Discharge status: Home / Self Care | Payer: No Typology Code available for payment source | Source: Ambulatory Visit | Attending: Vascular Surgery | Admitting: Vascular Surgery

## 2014-06-04 ENCOUNTER — Ambulatory Visit (INDEPENDENT_AMBULATORY_CARE_PROVIDER_SITE_OTHER): Payer: No Typology Code available for payment source | Admitting: Family

## 2014-06-04 VITALS — BP 94/61 | HR 46 | Resp 14 | Ht 71.0 in | Wt 244.0 lb

## 2014-06-04 DIAGNOSIS — Z48812 Encounter for surgical aftercare following surgery on the circulatory system: Secondary | ICD-10-CM | POA: Diagnosis present

## 2014-06-04 DIAGNOSIS — I739 Peripheral vascular disease, unspecified: Secondary | ICD-10-CM

## 2014-06-04 NOTE — Progress Notes (Signed)
[facility] [place] HISTORY AND PHYSICAL -PAD  History of Present Illness William Pacheco is a 63 y.o. male patient of Dr. Bridgett Larsson who returns for follow up for left leg claudication. He is s/p Left groin, above-the-knee and below-the-knee popliteal artery exploration, Left below-the-knee popliteal artery endarterectomy with bovine patch angioplasty, Open cannulation of left common femoral artery Left leg runoff on 09/16/2013.  He is also s/p R fem-pop BPG by Dr. Amedeo Plenty and L distal SFA stenting (08/30/11). 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  Pt walks about 30 yards and left calf cramps, resolves with rest. He has 2 cracks in his left heel that has trouble healing, was advised by his podiatrist to apply Neosporin daily.  Left arm is a harvest site for a vessel for his 6 vessel CABG in 2008. Pt denies tingling, numbness, or pain in either hand or arm.  The patient denies New Medical or Surgical History.  Pt Diabetic: Yes, states last A1C was 9.1, admits to not eating correctly Pt smoker: former smoker, quit in 2006  Pt meds include: Statin :Yes Betablocker: Yes ASA: no Other anticoagulants/antiplatelets:  taking Xaralto for atrial fib     Past Medical History  Diagnosis Date  . CAD (coronary artery disease), native coronary artery September 2008    Left main 50-60%, LAD 80% with D2 involved. RCA mid occlusion --> CABG  . 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 Ischemic cardiomyopathy September 2008    EF = 35-40%; THE 45-50% IN 2013 --> ECHO 03/03/13 - EF 40-45% LV: Moderate HK of basal-midlateral & inferior myocardium; c/w prior infarction in the distribution of the RCA or LCx. Mild-Mod MR with Calcified annulus. Mod TR  . History of MI (myocardial infarction) 2012    Noted by echocardiography, and nuclear stress testing. Known RCA occlusion prior to CABG  . PAF (paroxysmal  atrial fibrillation) - initial presentation with RVR, and heart failure 2013    On a beta blocker, calcium channel blocker & Xarelto. Lexiscan Myoview 11/08/11 LV was enlarged with no evidence of ischemia. There was a moderate sized, moderate to severe in intensity fixed defect in the inferior wall suggestive of scar with no reversible ischemia. There was a basal septal akinesis w/severe global hypokinesis. EF = 31% (Which is different from the [facility] EF)  . Chronic diastolic CHF (congestive heart failure), NYHA class 2 2009, 2011; 2013    Exacerbated by A. fib RVR, currently on Lasix.  . Peripheral arterial disease 2009, 2011; 2013    2009, 2011; 2013  . Renal artery stenosis, non-flow-limiting   . DM (diabetes mellitus), type 2 with neurological complications     Peripheral neuropathy  . Diabetic peripheral neuropathy associated with type 2 diabetes mellitus   . Hyperlipidemia LDL goal <70     On statin therapy  . Obesity (BMI 30.0-34.9)   . Hypertension associated with diabetes   . Erectile dysfunction associated with type 2 diabetes mellitus  2006  . Ischemic mitral regurgitation  1980s    Mild to moderate  . Thrombocytopenia      chronic  . Osteoarthritis of both knees 1990    Status post left knee a arthroscopy, currently [place] right knee  . History of hepatitis B 1980s  . Adopted     Adopted [V68.89]    Social History History  Substance Use Topics  . Smoking status:  Former Smoker -- 0.50 packs/day    Types: Cigarettes    Start date: 07/02/1973    Quit date: 07/02/2005  . Smokeless tobacco: Never Used  . Alcohol Use: No    Family History Family History  Problem Relation Age of Onset  . Adopted: Yes  . Cancer Mother     Past Surgical History  Procedure Laterality Date  . Femoral endarterectomy  03-15-2008    Left EIA & CFA endarterectomy by Dr. Amedeo Plenty  . Knee arthroscopy Left 1990    For osteoarthritis pain.  . Tonsillectomy    . Pleural effusion drainage Right    . Coronary artery bypass graft  03/2007    CABG x 6 LIMA-LAD, left radial to diagonal 1, SVG to OM1-OM2 sequential, SVG to PDA-PLA sequential.  . Transthoracic echocardiogram  November 2014    EF 40-45%. Moderate hypokinesis of the basal and mid lateral and inferior myocardium. Mild to moderate MR. Mild LA dilation.  . Transthoracic echocardiogram  07/26/2013    In setting of acute illness: EF 15-20%, atrial fibrillation; diffuse hypokinesis with inferolateral abnormalities consistent with ischemic heart disease; grade 3 diastolic dysfunction/restrictive physiology - elevated LVEDP/LAP..  . Transthoracic echocardiogram  09/09/2013    40%. Septal hypokinesis. Moderate to severely dilated LV. Mild LVH and diffuse hypokinesis. Mild to moderate MR.  Marland Kitchen Nm myoview ltd  08/13/2048    Moderate LV dilation. Abnormal study with all high-risk features, unchanged from October 2014. Inferior defect likely representing nontransmural scar. Suggesting nonischemic cardiopathy superimposed on known ischemic cardiomyopathy.; Severe global hypokinesis with EF of roughly 31%.  . Vascular surgery Bilateral 2006    pt reports about 4 vascular surgeries starting in 2006 and last was in 2013.  Marland Kitchen Patch angioplasty Left 09/16/2013    Procedure: PATCH ANGIOPLASTY OF LEFT BELOW THE KNEE POPLITEAL ARTERY;  Surgeon: Conrad [place], MD;  Location: [place];  Service: Vascular;  Laterality: Left;  . Intraoperative arteriogram Left 09/16/2013    Procedure: INTRA OPERATIVE ARTERIOGRAM;  Surgeon: Conrad [place], MD;  Location: [place];  Service: Vascular;  Laterality: Left;  . Groin dissection Left 09/16/2013    Procedure: GROIN EXPLORATION;  Surgeon: Conrad [place], MD;  Location: [facility];  Service: Vascular;  Laterality: Left;  . Endarterectomy popliteal Left 09/16/2013    Procedure: ENDARTERECTOMYOF LEFT POPLITEAL ARTERY;  Surgeon: Conrad Hale, MD;  Location: [place];  Service: Vascular;  Laterality: Left;    No Known Allergies  Current  Outpatient Prescriptions  Medication Sig Dispense Refill  . acetaminophen (TYLENOL) 500 MG tablet Take 500 mg by mouth every 6 (six) hours as needed.    Marland Kitchen aspirin 81 MG tablet Take 81 mg by mouth daily.    Marland Kitchen atorvastatin (LIPITOR) 20 MG tablet Take 20 mg by mouth daily at 6 PM.    . Calcium Carb-Cholecalciferol (CALCIUM 1000 + D PO) Take 2 tablets by mouth daily.    . calcium carbonate (OS-CAL) 600 MG TABS tablet Take 600 mg by mouth 2 (two) times daily with a meal. Take two tablets daily    . Canagliflozin (INVOKANA PO) Take 300 mg by mouth daily.     . carvedilol (COREG) 25 MG tablet Take 37.5 mg by mouth 2 (two) times daily with a meal. (1 and 1/2 tablets)    . Cyanocobalamin (VITAMIN B-12) 2000 MCG TBCR Take 3 tablets by mouth daily.    . furosemide (LASIX) 40 MG tablet Take 1 tablet (40 mg total) by mouth 2 (  two) times daily. 60 tablet 9  . Glucosamine-Chondroit-Vit C-Mn (GLUCOSAMINE CHONDR 1500 COMPLX PO) Take 1,500 mg by mouth daily.    . Insulin Aspart (NOVOLOG [place]) Inject 10 Units into the skin 2 (two) times daily.    . insulin glargine (LANTUS) 100 UNIT/ML injection Inject 50 Units into the skin at bedtime.     Marland Kitchen KLOR-CON M10 10 MEQ tablet     . LANTUS SOLOSTAR 100 UNIT/ML Solostar Pen     . Magnesium Oxide 500 MG CAPS Take 1,000 mg by mouth.    . mupirocin ointment (BACTROBAN) 2 %     . omega-3 acid ethyl esters (LOVAZA) 1 G capsule Take 1 g by mouth daily.    . potassium chloride (K-DUR) 10 MEQ tablet Take 1 tablet (10 mEq total) by mouth 2 (two) times daily. 60 tablet 0  . pregabalin (LYRICA) 100 MG capsule Take 100 mg by mouth 2 (two) times daily.    . ramipril (ALTACE) 10 MG capsule Take 1 capsule (10 mg total) by mouth daily. 30 capsule 0  . Rivaroxaban (XARELTO) 20 MG TABS tablet Take 1 tablet (20 mg total) by mouth daily with supper. 30 tablet 0  . traMADol (ULTRAM) 50 MG tablet Take 1 tablet (50 mg total) by mouth every 6 (six) hours as needed. 50 tablet 0  . vitamin C  (ASCORBIC ACID) 500 MG tablet Take 1,500 mg by mouth daily.    . Vitamin D, Cholecalciferol, 1000 UNITS TABS Take 3,000 Units by mouth daily.    . vitamin E 400 UNIT capsule Take 1,200 Units by mouth daily.    . [DISCONTINUED] amLODipine (NORVASC) 10 MG tablet Take 10 mg by mouth daily.    . [DISCONTINUED] pioglitazone-metformin (ACTOPLUS MET) 15-850 MG per tablet Take 1 tablet by mouth 2 (two) times daily with a meal.    . [DISCONTINUED] simvastatin (ZOCOR) 40 MG tablet Take 40 mg by mouth every evening.     No current facility-administered medications for this visit.    ROS: See HPI for pertinent positives and negatives.   Physical Examination  Filed Vitals:   06/04/14 1234  BP: 94/61  Pulse: 46  Resp: 14  Height: $Remove'5\' 11"'YIBulLf$  (1.803 m)  Weight: 244 lb (110.678 kg)  SpO2: 99%   Body mass index is 34.05 kg/(m^2).  General: A&O x 3, WDWN, obese male  Pulmonary: Sym exp, good air movt, CTAB, no rales, rhonchi, & wheezing  Cardiac: RRR, no detected murmur. Left carotid bruit. Aorta is not palpable.  Vascular: Vessel Right Left  Radial Not Palpable Not Palpable  Brachial Not Palpable Not Palpable  Carotid Palpable, without bruit Palpable, without bruit  Aorta Not palpable N/A  Femoral Not Palpable Not Palpable  Popliteal Not palpable Not palpable  PT not Palpable Not Palpable  DP Not Palpable Not Palpable   Gastrointestinal: soft, NTND, -G/R, - HSM, - masses palpated, - CVAT B  Musculoskeletal: M/S 5/5 throughout , Extremities without ischemic changes , no ulcers or gangrene in either feet. Fissures in left heel, scaly plantar surfaces of both feet.  Neurologic: Pain and light touch intact in extremities , Motor exam as listed above    Outside Studies/Documentation 11/04/13 carotid Duplex performed at [facility]:  Technically difficult due to high bifurcations. tortuosity, and thickness of the neck. - Bilateral - 1% to 39 % ICA stenosis. Vertebral  artery flow on the right is antegrade. Left vertebral artery flow is dampened and somewhat spiked.   Non-Invasive Vascular Imaging: DATE:  06/04/2014 ABI: RIGHT 0.68, Waveforms: monophasic;  LEFT 0.37, Waveforms: monophasic, PT not detected Previous (02/19/14) ABI's: Right: 0.79, Left: 0.39   ASSESSMENT: William Pacheco is a 63 y.o. male who is s/p Left groin, above-the-knee and below-the-knee popliteal artery exploration, Left below-the-knee popliteal artery endarterectomy with bovine patch angioplasty, Open cannulation of left common femoral artery Left leg runoff on 09/16/2013.  He is also s/p R fem-pop BPG by Dr. Amedeo Plenty and L distal SFA stenting (08/30/11). 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 He has moderate left calf claudication with no tissue loss to this point but he does have 2 new heel fissures, will see his podiatrist in 3 days re this.  ABI's are worse in the right LE compared to three months ago, evidence of moderate arterial occlusive disease, and stable in the left LE with evidence of critical limb ischemia, unchanged from 3 months prior. The pt's DM is uncontrolled, he continues to gain weight; fortunately he stopped smoking in 2006. Consider a GLP-1 such as Victoza, Byetta, or Bydureon for adjunct management of Type 2 DM and to encourage weight loss; defer to pt's PCP.  PLAN:  Graduated walking program. I discussed in depth with the patient the nature of atherosclerosis, and emphasized the importance of maximal medical management including strict control of blood pressure, blood glucose, and lipid levels, obtaining regular exercise, and continued cessation of smoking.  The patient is aware that without maximal medical management the underlying atherosclerotic disease process will progress, limiting the benefit of any interventions.  Based on the patient's vascular studies and examination, pt will return to clinic in 3 months  with ABI's.  The patient was given information about PAD including signs, symptoms, treatment, what symptoms should prompt the patient to seek immediate medical care, and risk reduction measures to take.  Clemon Chambers, RN, MSN, FNP-C [facility] Phone: [phone]  Clinic MD: Bridgett Larsson  06/04/2014 12:36 PM

## 2014-06-04 NOTE — Addendum Note (Signed)
Addended by: Sharee Pimple on: 06/04/2014 02:54 PM   Modules accepted: Orders

## 2014-06-04 NOTE — Patient Instructions (Addendum)
Peripheral Vascular Disease Peripheral Vascular Disease (PVD), also called Peripheral Arterial Disease (PAD), is a circulation problem caused by cholesterol (atherosclerotic plaque) deposits in the arteries. PVD commonly occurs in the lower extremities (legs) but it can occur in other areas of the body, such as your arms. The cholesterol buildup in the arteries reduces blood flow which can cause pain and other serious problems. The presence of PVD can place a person at risk for Coronary Artery Disease (CAD).  CAUSES  Causes of PVD can be many. It is usually associated with more than one risk factor such as:   High Cholesterol.  Smoking.  Diabetes.  Lack of exercise or inactivity.  High blood pressure (hypertension).  Obesity.  Family history. SYMPTOMS   When the lower extremities are affected, patients with PVD may experience:  Leg pain with exertion or physical activity. This is called INTERMITTENT CLAUDICATION. This may present as cramping or numbness with physical activity. The location of the pain is associated with the level of blockage. For example, blockage at the abdominal level (distal abdominal aorta) may result in buttock or hip pain. Lower leg arterial blockage may result in calf pain.  As PVD becomes more severe, pain can develop with less physical activity.  In people with severe PVD, leg pain may occur at rest.  Other PVD signs and symptoms:  Leg numbness or weakness.  Coldness in the affected leg or foot, especially when compared to the other leg.  A change in leg color.  Patients with significant PVD are more prone to ulcers or sores on toes, feet or legs. These may take longer to heal or may reoccur. The ulcers or sores can become infected.  If signs and symptoms of PVD are ignored, gangrene may occur. This can result in the loss of toes or loss of an entire limb.  Not all leg pain is related to PVD. Other medical conditions can cause leg pain such  as:  Blood clots (embolism) or Deep Vein Thrombosis.  Inflammation of the blood vessels (vasculitis).  Spinal stenosis. DIAGNOSIS  Diagnosis of PVD can involve several different types of tests. These can include:  Pulse Volume Recording Method (PVR). This test is simple, painless and does not involve the use of X-rays. PVR involves measuring and comparing the blood pressure in the arms and legs. An ABI (Ankle-Brachial Index) is calculated. The normal ratio of blood pressures is 1. As this number becomes smaller, it indicates more severe disease.  < 0.95 - indicates significant narrowing in one or more leg vessels.  <0.8 - there will usually be pain in the foot, leg or buttock with exercise.  <0.4 - will usually have pain in the legs at rest.  <0.25 - usually indicates limb threatening PVD.  Doppler detection of pulses in the legs. This test is painless and checks to see if you have a pulses in your legs/feet.  A dye or contrast material (a substance that highlights the blood vessels so they show up on x-ray) may be given to help your caregiver better see the arteries for the following tests. The dye is eliminated from your body by the kidney's. Your caregiver may order blood work to check your kidney function and other laboratory values before the following tests are performed:  Magnetic Resonance Angiography (MRA). An MRA is a picture study of the blood vessels and arteries. The MRA machine uses a large magnet to produce images of the blood vessels.  Computed Tomography Angiography (CTA). A CTA   is a specialized x-ray that looks at how the blood flows in your blood vessels. An IV may be inserted into your arm so contrast dye can be injected.  Angiogram. Is a procedure that uses x-rays to look at your blood vessels. This procedure is minimally invasive, meaning a small incision (cut) is made in your groin. A small tube (catheter) is then inserted into the artery of your groin. The catheter  is guided to the blood vessel or artery your caregiver wants to examine. Contrast dye is injected into the catheter. X-rays are then taken of the blood vessel or artery. After the images are obtained, the catheter is taken out. TREATMENT  Treatment of PVD involves many interventions which may include:  Lifestyle changes:  Quitting smoking.  Exercise.  Following a low fat, low cholesterol diet.  Control of diabetes.  Foot care is very important to the PVD patient. Good foot care can help prevent infection.  Medication:  Cholesterol-lowering medicine.  Blood pressure medicine.  Anti-platelet drugs.  Certain medicines may reduce symptoms of Intermittent Claudication.  Interventional/Surgical options:  Angioplasty. An Angioplasty is a procedure that inflates a balloon in the blocked artery. This opens the blocked artery to improve blood flow.  Stent Implant. A wire mesh tube (stent) is placed in the artery. The stent expands and stays in place, allowing the artery to remain open.  Peripheral Bypass Surgery. This is a surgical procedure that reroutes the blood around a blocked artery to help improve blood flow. This type of procedure may be performed if Angioplasty or stent implants are not an option. SEEK IMMEDIATE MEDICAL CARE IF:   You develop pain or numbness in your arms or legs.  Your arm or leg turns cold, becomes blue in color.  You develop redness, warmth, swelling and pain in your arms or legs. MAKE SURE YOU:   Understand these instructions.  Will watch your condition.  Will get help right away if you are not doing well or get worse. Document Released: 07/26/2004 Document Revised: 09/10/2011 Document Reviewed: 06/22/2008 [facility] Patient Information 2015 [place], [place]. This information is not intended to replace advice given to you by your health care provider. Make sure you discuss any questions you have with your health care provider.   Consider discussing  with your PCP:  Victoza, or Bydureon, or Byetta as adjunct to help improve your Type 2 DM and to help with steady weight loss.

## 2014-06-07 ENCOUNTER — Ambulatory Visit (INDEPENDENT_AMBULATORY_CARE_PROVIDER_SITE_OTHER): Payer: No Typology Code available for payment source | Admitting: Podiatry

## 2014-06-07 ENCOUNTER — Encounter: Payer: Self-pay | Admitting: Podiatry

## 2014-06-07 VITALS — BP 118/63 | HR 81 | Resp 12 | Ht 71.0 in | Wt 245.0 lb

## 2014-06-07 DIAGNOSIS — R234 Changes in skin texture: Secondary | ICD-10-CM

## 2014-06-07 DIAGNOSIS — L989 Disorder of the skin and subcutaneous tissue, unspecified: Secondary | ICD-10-CM

## 2014-06-07 NOTE — Patient Instructions (Signed)
Apply triple antibiotic ointment to skin fissures 2 times a day on the left heel and cover with gauze Wear surgical shoe on left foot Complete any remaining oral antibiotics

## 2014-06-07 NOTE — Progress Notes (Signed)
   Subjective:    Patient ID: William Pacheco, male    DOB: 07/14/1950, 63 y.o.   MRN: [id number]  HPI Comments: N cracking heel L left medial and lateral heel D 1 week O C hard dry skin and cracks A diabetic pt and decreased mobility T ointment and bandaid.  Patient presents today complaining of skin lesions left heel and is currently taking unknown antibiotics by mouth for this area prescribed by his medical doctor does not on his medication list. He has applying unknown ointment and a Band-Aid to these areas  Diabetic with a history of peripheral arterial disease and neuropathy  History of vascular surgery lower extremities  Review of Systems  All other systems reviewed and are negative.      Objective:   Physical Exam Orientated 3  Vascular: DP pulses 0/4 bilaterally PT pulses 0/4 bilaterally  Neurological: Sensation to 10 g monofilament wire intact 3/5 right and 1/5 left Ankle reflexes reactive bilaterally Vibratory sensation intact bilaterally  Dermatological: The toenails are hypertrophic, discolored 6-10 Fissure medial left heel 20 mm x 1 mm with serous drainage. There is no erythema, edema, warmth or malodor surrounding the fissure Fissure lateral left heel 5 mm x 1 mm without drainage. There is no erythema, edema, warmth surrounding the fissure  Musculoskeletal: HAV deformity left Hammertoe second left       Assessment & Plan:   Assessment: Decreased pedal pulses consistent with known peripheral arterial disease Fissures 2 left heel without obvious clinical sign of infection  Plan: Complete any remaining oral antibiotics for prophylaxis Apply triple antibiotic ointment to skin fissures twice a day and cover with gauze Dispensed surgical shoe to be worn on left foot  Reappoint 7 days

## 2014-06-08 ENCOUNTER — Telehealth: Payer: Self-pay | Admitting: *Deleted

## 2014-06-08 NOTE — Telephone Encounter (Signed)
Pt states no one called the Triple Antibiotic ointment to the [facility].  I informed pt that Neosporin is available at any pharmacy over the counter.

## 2014-06-08 NOTE — Telephone Encounter (Signed)
Pt states he doesn't understand about the triple antibiotic ointment.  I called pt again and explained Triple Antibiotic ointment was generic for Neosporin a should be found in the same area in the first aid aisle.  Pt also asked why his co-pay a few days before was $40.00 and yesterday was $60.00.  i told him I would have a receptionist call to explain.

## 2014-06-10 ENCOUNTER — Encounter (HOSPITAL_COMMUNITY): Payer: Self-pay | Admitting: Vascular Surgery

## 2014-06-14 ENCOUNTER — Ambulatory Visit (INDEPENDENT_AMBULATORY_CARE_PROVIDER_SITE_OTHER): Payer: No Typology Code available for payment source | Admitting: Podiatry

## 2014-06-14 ENCOUNTER — Encounter: Payer: Self-pay | Admitting: Podiatry

## 2014-06-14 VITALS — BP 79/57 | HR 80 | Temp 97.7°F | Resp 12

## 2014-06-14 DIAGNOSIS — R234 Changes in skin texture: Secondary | ICD-10-CM

## 2014-06-14 DIAGNOSIS — L989 Disorder of the skin and subcutaneous tissue, unspecified: Secondary | ICD-10-CM

## 2014-06-14 NOTE — Patient Instructions (Signed)
Continue to apply triple antibiotic ointment to skin crack on left heel daily cover with Telfa pad and gauze Wear rubber heel cups on right and left heels when walking Using a pumice stone or callus file after showering, Vaseline to skin on right heel

## 2014-06-15 NOTE — Progress Notes (Signed)
Patient ID: William Pacheco, male   DOB: 1950-12-27, 63 y.o.   MRN: [id number] Subjective: Patient presents for follow-up care for fissuring on left heels 2. Is complaining previous unknown antibiotic prescribed his medical doctor and has several more doses and denies any complaint from the antibiotics. The initial visit in our office was 06/07/2014.  He has a history of peripheral arterial disease  Objective: Orientated 3  Fissure on the lateral left heel is closed The fissure on the medial left heel is 20 x 1 mm with a granular base. There is no erythema, edema, warmth surrounding the medial fissure  Dry mildly callusing skin plantarly right heel area and forefoot area   Assessment: Resolve fissure 1 on lateral heel left Continuing fissure on medial left heel without any clinical sign of infection Peripheral arterial disease  Plan: Complete any remaining oral antibiotics for prophylaxis Continue to apply triple antibiotic ointment to fissure on left heel daily, cover with Telfa and attach with Coflex tape Dispense Tuli rubber heel cups 2 Wear the heel cups over the right and left heels when walking Okay to continue wearing surgical shoe on left Patient is wife advised to use pumice stone and Vaseline and right foot on callused areas daily  Reappoint 7 days

## 2014-07-06 ENCOUNTER — Ambulatory Visit: Payer: No Typology Code available for payment source | Admitting: Podiatry

## 2014-07-08 ENCOUNTER — Encounter: Payer: Self-pay | Admitting: Cardiology

## 2014-07-08 ENCOUNTER — Ambulatory Visit (INDEPENDENT_AMBULATORY_CARE_PROVIDER_SITE_OTHER): Payer: 59 | Admitting: Cardiology

## 2014-07-08 VITALS — BP 120/52 | HR 73 | Ht 72.0 in | Wt 252.8 lb

## 2014-07-08 DIAGNOSIS — R9431 Abnormal electrocardiogram [ECG] [EKG]: Secondary | ICD-10-CM

## 2014-07-08 DIAGNOSIS — I70219 Atherosclerosis of native arteries of extremities with intermittent claudication, unspecified extremity: Secondary | ICD-10-CM

## 2014-07-08 DIAGNOSIS — I255 Ischemic cardiomyopathy: Secondary | ICD-10-CM

## 2014-07-08 DIAGNOSIS — I252 Old myocardial infarction: Secondary | ICD-10-CM

## 2014-07-08 DIAGNOSIS — E669 Obesity, unspecified: Secondary | ICD-10-CM

## 2014-07-08 DIAGNOSIS — E785 Hyperlipidemia, unspecified: Secondary | ICD-10-CM

## 2014-07-08 DIAGNOSIS — I10 Essential (primary) hypertension: Secondary | ICD-10-CM

## 2014-07-08 DIAGNOSIS — I5042 Chronic combined systolic (congestive) and diastolic (congestive) heart failure: Secondary | ICD-10-CM

## 2014-07-08 DIAGNOSIS — I480 Paroxysmal atrial fibrillation: Secondary | ICD-10-CM

## 2014-07-08 DIAGNOSIS — I25118 Atherosclerotic heart disease of native coronary artery with other forms of angina pectoris: Secondary | ICD-10-CM

## 2014-07-08 NOTE — Patient Instructions (Signed)
Your physician has requested that you have a lexiscan myoview. For further information please visit [email or web address] Please follow instruction sheet, as given.  Your physician recommends that you  have lab work  CMP ,LIPID - DO NOT EAT OR DRINK THE MORNING OF LABS.   Your physician wants you to follow-up in 6 MONTHS DR HARDING.- IF TEST IS ABNORMAL WILL MAKE AN APPOINTMENT EARLIER.  You will receive a reminder letter in the mail two months in advance. If you don't receive a letter, please call our office to schedule the follow-up appointment.

## 2014-07-09 ENCOUNTER — Encounter (HOSPITAL_COMMUNITY): Payer: Self-pay | Admitting: *Deleted

## 2014-07-10 ENCOUNTER — Encounter: Payer: Self-pay | Admitting: Cardiology

## 2014-07-10 DIAGNOSIS — R9431 Abnormal electrocardiogram [ECG] [EKG]: Secondary | ICD-10-CM | POA: Insufficient documentation

## 2014-07-10 NOTE — Assessment & Plan Note (Signed)
Thankfully no active anginal symptoms to speak of, however concerning EKG. Last Myoview showed a large scar without ischemia. Continue aspirin statin beta blocker and ACE inhibitor.  Followup pending results of the [facility]

## 2014-07-10 NOTE — Assessment & Plan Note (Signed)
On atorvastatin. Last checked. We will order them to be drawn when he comes in for his stress test

## 2014-07-10 NOTE — Assessment & Plan Note (Signed)
See above

## 2014-07-10 NOTE — Assessment & Plan Note (Signed)
The patient understands the need to lose weight with diet and exercise. We have discussed specific strategies for this. Again, I continue to stress the importance of continued to walk despite claudication

## 2014-07-10 NOTE — Assessment & Plan Note (Signed)
I am concerned about potentially ischemic T-wave inversions in the lateral and inferior leads patient who is essentially inactive and has diabetes. Nuclear stress test was suggested a likely silent MI in the past. This would suggest that he may not necessarily notice anginal symptoms. He is also not active enough to illicit exertional symptoms. Emergency fluid any new ischemia, we'll check a [employer]

## 2014-07-10 NOTE — Assessment & Plan Note (Signed)
Normal blood pressure today, but he did have a visit in December with vascular surgery when he was hypotensive. With his cardiomyopathy, would like to keep him relatively low blood pressure side. I did explain to him that he does feel lightheaded or dizzy he should hold his standard dose before holding the beta blocker dose.

## 2014-07-10 NOTE — Assessment & Plan Note (Signed)
His most recent episode that would be consistent with a non-ST elevation MI was back in January when he had a positive troponin level. At that time he denied chest pain, he was in the heart failure exacerbation related to atrial fibrillation. Is this concern, that he would have myocardial ischemia/infarction without symptoms of angina, that makes me concerned enough to check a Myoview stress test.

## 2014-07-10 NOTE — Assessment & Plan Note (Signed)
No obvious no recurrences. On a beta blocker for rate control. Anticoagulated with Xarelto.

## 2014-07-10 NOTE — Assessment & Plan Note (Signed)
He does have significant, symptom limited claudication symptoms. Unfortunately, he does not have any more revascularization options. The main treatment here is to continue to encourage walking. Short stops to allow for claudication pain to recover and then continued walking his vital for including collateral flow to avoid potential need for amputation in the future. Also to ensure that he does not continue to gain weight. I reiterated the need to monitor his feet and legs especially for any signs or symptoms of wound/lesions.

## 2014-07-10 NOTE — Assessment & Plan Note (Addendum)
Has been stable with functional class unable to determine based on his claudication level. No active PND or orthopnea. On stable regimen as noted above. Also on standing Lasix with instructions for sliding scale.

## 2014-07-10 NOTE — Progress Notes (Signed)
PCP: Philis Fendt, MD  Clinic Note: Chief Complaint  Patient presents with  . Follow-up    12 mth visit.  pt denies chest pain, sob, and swelling.    HPI: William Pacheco is a 64 y.o. male with a PMH below who presents today for assessment six-month followup for his long-standing CAD along with multiplecardiac risk factors &chronic PAD.Marland Kitchen  Past Medical History  Diagnosis Date  . CAD (coronary artery disease), native coronary artery September 2008    Left main 50-60%, LAD 80% with D2 involved. RCA mid occlusion --> CABG  . 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 Ischemic cardiomyopathy September 2008    EF = 35-40%; THE 45-50% IN 2013 --> ECHO 03/03/13 - EF 40-45% LV: Moderate HK of basal-midlateral & inferior myocardium; c/w prior infarction in the distribution of the RCA or LCx. Mild-Mod MR with Calcified annulus. Mod TR  . History of MI (myocardial infarction) 2012    Noted by echocardiography, and nuclear stress testing. Known RCA occlusion prior to CABG  . PAF (paroxysmal atrial fibrillation) - initial presentation with RVR, and heart failure 2013    On a beta blocker, calcium channel blocker & Xarelto. Lexiscan Myoview 11/08/11 LV was enlarged with no evidence of ischemia. There was a moderate sized, moderate to severe in intensity fixed defect in the inferior wall suggestive of scar with no reversible ischemia. There was a basal septal akinesis w/severe global hypokinesis. EF = 31% (Which is different from the [facility] EF)  . Chronic diastolic CHF (congestive heart failure), NYHA class 2 2009, 2011; 2013    Exacerbated by A. fib RVR, currently on Lasix.  . Peripheral arterial disease 2009, 2011; 2013    2009, 2011; 2013  . Renal artery stenosis, non-flow-limiting   . DM (diabetes mellitus), type 2 with neurological complications     Peripheral neuropathy  . Diabetic peripheral neuropathy associated with type 2 diabetes mellitus   .  Hyperlipidemia LDL goal <70     On statin therapy  . Obesity (BMI 30.0-34.9)   . Hypertension associated with diabetes   . Erectile dysfunction associated with type 2 diabetes mellitus  2006  . Ischemic mitral regurgitation  1980s    Mild to moderate  . Thrombocytopenia      chronic  . Osteoarthritis of both knees 1990    Status post left knee a arthroscopy, currently [place] right knee  . History of hepatitis B 1980s  . Adopted     Adopted [V68.89]   Interval History: I last saw him in May of last year, and he was doing relatively well. He was still bothered somewhat by claudication of his left leg. That seems to still be the main issue for him as he really T. Do much activity at all with his left leg without having calf claudication stops him from doing much. As a result he is become sedentary and has gained about 12 pounds. He really can't comment on the exertional chest tightness or dyspnea because his claudication happens first. He denies any resting dyspnea or angina. No images. No PND, orthopnea or edema.  No palpitations, lightheadedness, dizziness, weakness, syncope/near syncope, 4TIA/amaurosis fugax symptoms. No melena, hematochezia, hematuria, or epstaxis. ++ LLE claudication.  ROS: A comprehensive was performed. Review of Systems  Constitutional: Positive for malaise/fatigue (Mostly as a result of really not doing much of anything).  Respiratory: Negative for wheezing.   Cardiovascular: Positive for claudication.  Gastrointestinal: Negative for blood in stool.  Genitourinary: Negative for hematuria and flank pain.  Musculoskeletal: Positive for myalgias and joint pain.  Neurological: Negative for dizziness (Despite having episodes of hypotension including today) and loss of consciousness.  Endo/Heme/Allergies: Does not bruise/bleed easily.  All other systems reviewed and are negative.   Current Outpatient Prescriptions on File Prior to Visit  Medication Sig Dispense  Refill  . acetaminophen (TYLENOL) 500 MG tablet Take 500 mg by mouth every 6 (six) hours as needed.    Marland Kitchen aspirin 81 MG tablet Take 81 mg by mouth daily.    Marland Kitchen atorvastatin (LIPITOR) 20 MG tablet Take 20 mg by mouth daily at 6 PM.    . Calcium Carb-Cholecalciferol (CALCIUM 1000 + D PO) Take 2 tablets by mouth daily.    . calcium carbonate (OS-CAL) 600 MG TABS tablet Take 600 mg by mouth 2 (two) times daily with a meal. Take two tablets daily    . Canagliflozin (INVOKANA PO) Take 300 mg by mouth daily.     . carvedilol (COREG) 25 MG tablet Take 37.5 mg by mouth 2 (two) times daily with a meal. (1 and 1/2 tablets)    . Cyanocobalamin (VITAMIN B-12) 2000 MCG TBCR Take 3 tablets by mouth daily.    . furosemide (LASIX) 40 MG tablet Take 1 tablet (40 mg total) by mouth 2 (two) times daily. 60 tablet 9  . Glucosamine-Chondroit-Vit C-Mn (GLUCOSAMINE CHONDR 1500 COMPLX PO) Take 1,500 mg by mouth daily.    . Insulin Aspart (NOVOLOG [place]) Inject 10 Units into the skin 2 (two) times daily.    . insulin glargine (LANTUS) 100 UNIT/ML injection Inject 60 Units into the skin at bedtime.     Marland Kitchen KLOR-CON M10 10 MEQ tablet     . LANTUS SOLOSTAR 100 UNIT/ML Solostar Pen     . Magnesium Oxide 500 MG CAPS Take 1,000 mg by mouth.    . mupirocin ointment (BACTROBAN) 2 %     . omega-3 acid ethyl esters (LOVAZA) 1 G capsule Take 1 g by mouth daily.    . pregabalin (LYRICA) 100 MG capsule Take 100 mg by mouth 2 (two) times daily.    . ramipril (ALTACE) 10 MG capsule Take 1 capsule (10 mg total) by mouth daily. 30 capsule 0  . Rivaroxaban (XARELTO) 20 MG TABS tablet Take 1 tablet (20 mg total) by mouth daily with supper. 30 tablet 0  . traMADol (ULTRAM) 50 MG tablet Take 1 tablet (50 mg total) by mouth every 6 (six) hours as needed. 50 tablet 0  . vitamin C (ASCORBIC ACID) 500 MG tablet Take 1,500 mg by mouth daily.    . Vitamin D, Cholecalciferol, 1000 UNITS TABS Take 3,000 Units by mouth daily.    . vitamin E 400 UNIT  capsule Take 1,200 Units by mouth daily.    . [DISCONTINUED] amLODipine (NORVASC) 10 MG tablet Take 10 mg by mouth daily.    . [DISCONTINUED] pioglitazone-metformin (ACTOPLUS MET) 15-850 MG per tablet Take 1 tablet by mouth 2 (two) times daily with a meal.    . [DISCONTINUED] simvastatin (ZOCOR) 40 MG tablet Take 40 mg by mouth every evening.     No current facility-administered medications on file prior to visit.   No Known Allergies  SOCIAL AND FAMILY HISTORY REVIEWED IN EPIC -- notable change is that he just had a new grandchild in November -- his third grandson    Wt Readings from Last 3 Encounters:  07/08/14 252  lb 12.8 oz (114.669 kg)  06/07/14 245 lb (111.131 kg)  06/04/14 244 lb (110.678 kg)    PHYSICAL EXAM BP 120/52 mmHg  Pulse 73  Ht 6' (1.829 m)  Wt 252 lb 12.8 oz (114.669 kg)  BMI 34.28 kg/m2 General appearance: alert, cooperative, appears stated age, no distress and mildly obese Neck: no adenopathy, no carotid bruit, no JVD and supple, symmetrical, trachea midline Lungs: clear to auscultation bilaterally, normal percussion bilaterally and Nonlabored, good air movement Heart: RRR, normal S1 and S2. Possible soft S4. Early peaking 1/6 SEM at the base radiating to carotid. Also 1/6 blowing HSM at left lower sternal border Abdomen: soft, non-tender; bowel sounds normal; no masses, no organomegaly and Mildly obese Extremities: No clubbing or cyanosis. The left lower leg as varying stages of healing at the op site. Pulses: Still diminished but palpable bilaterally. Neurologic: Grossly normal; CN II through XII grossly intact   Adult ECG Report  Rate: 73 ;  Rhythm: normal sinus rhythm; borderline first-degree AV block with likely biatrial enlargement. T-wave inversions that are more pronounced in leads V4 through V6 as well as I & II, cannot exclude ischemia  Narrative Interpretation: abnormal EKG with new T wave inversions suggestive of possible lateral / inferolateral  ischemia  Recent Labs:    None available  ASSESSMENT / PLAN: Abnormal finding on EKG I am concerned about potentially ischemic T-wave inversions in the lateral and inferior leads patient who is essentially inactive and has diabetes. Nuclear stress test was suggested a likely silent MI in the past. This would suggest that he may not necessarily notice anginal symptoms. He is also not active enough to illicit exertional symptoms. Emergency fluid any new ischemia, we'll check a Lexiscan Myoview  CAD- CABG x 6 9/08- Myoview 5/13 with scar, no ischemia Thankfully no active anginal symptoms to speak of, however concerning EKG. Last Myoview showed a large scar without ischemia. Continue aspirin statin beta blocker and ACE inhibitor.  Followup pending results of the [place]  Ischemic cardiomyopathy; EF roughly 40% Has been stable with functional class unable to determine based on his claudication level. No active PND or orthopnea. On stable regimen as noted above. Also on standing Lasix with instructions for sliding scale.  Chronic combined systolic and diastolic congestive heart failure, NYHA class 2 See above  Atherosclerosis of native arteries of extremity with intermittent claudication He does have significant, symptom limited claudication symptoms. Unfortunately, he does not have any more revascularization options. The main treatment here is to continue to encourage walking. Short stops to allow for claudication pain to recover and then continued walking his vital for including collateral flow to avoid potential need for amputation in the future. Also to ensure that he does not continue to gain weight. I reiterated the need to monitor his feet and legs especially for any signs or symptoms of wound/lesions.  PAF (paroxysmal atrial fibrillation) No obvious no recurrences. On a beta blocker for rate control. Anticoagulated with Xarelto.  History of non-ST elevation myocardial  infarction (NSTEMI) His most recent episode that would be consistent with a non-ST elevation MI was back in January when he had a positive troponin level. At that time he denied chest pain, he was in the heart failure exacerbation related to atrial fibrillation. Is this concern, that he would have myocardial ischemia/infarction without symptoms of angina, that makes me concerned enough to check a Myoview stress test.  Hyperlipidemia with target LDL less than 70 On atorvastatin. Last checked. We  will order them to be drawn when he comes in for his stress test  Essential hypertension Normal blood pressure today, but he did have a visit in December with vascular surgery when he was hypotensive. With his cardiomyopathy, would like to keep him relatively low blood pressure side. I did explain to him that he does feel lightheaded or dizzy he should hold his standard dose before holding the beta blocker dose.  Obesity (BMI 30-39.9) The patient understands the need to lose weight with diet and exercise. We have discussed specific strategies for this. Again, I continue to stress the importance of continued to walk despite claudication    Orders Placed This Encounter  Procedures  . Lipid panel    Order Specific Question:  Has the patient fasted?    Answer:  Yes  . Comprehensive metabolic panel    Order Specific Question:  Has the patient fasted?    Answer:  Yes  . Myocardial Perfusion Imaging    Standing Status: Future     Number of Occurrences:      Standing Expiration Date: 07/08/2015    Order Specific Question:  Where should this test be performed    Answer:  [facility]    Order Specific Question:  Type of stress    Answer:  Lexiscan    Order Specific Question:  Patient weight in lbs    Answer:  252  . EKG 12-Lead   No orders of the defined types were placed in this encounter.    Followup: 6 months if the Lexiscan Myoview negative; sooner if positive.   Leonie Man,  M.D., M.S. Interventional Cardiologist   Pager # [phone]

## 2014-07-15 ENCOUNTER — Ambulatory Visit: Payer: No Typology Code available for payment source | Admitting: Podiatry

## 2014-07-20 ENCOUNTER — Telehealth (HOSPITAL_COMMUNITY): Payer: Self-pay

## 2014-07-20 NOTE — Telephone Encounter (Signed)
Encounter complete. 

## 2014-07-22 ENCOUNTER — Ambulatory Visit (HOSPITAL_COMMUNITY)
Admission: RE | Admit: 2014-07-22 | Discharge: 2014-07-22 | Disposition: A | Discharge status: Home / Self Care | Payer: 59 | Source: Ambulatory Visit | Attending: Cardiology | Admitting: Cardiology

## 2014-07-22 DIAGNOSIS — I2510 Atherosclerotic heart disease of native coronary artery without angina pectoris: Secondary | ICD-10-CM | POA: Insufficient documentation

## 2014-07-22 DIAGNOSIS — E119 Type 2 diabetes mellitus without complications: Secondary | ICD-10-CM | POA: Insufficient documentation

## 2014-07-22 DIAGNOSIS — Z87891 Personal history of nicotine dependence: Secondary | ICD-10-CM | POA: Diagnosis not present

## 2014-07-22 DIAGNOSIS — I255 Ischemic cardiomyopathy: Secondary | ICD-10-CM

## 2014-07-22 DIAGNOSIS — E669 Obesity, unspecified: Secondary | ICD-10-CM | POA: Diagnosis not present

## 2014-07-22 DIAGNOSIS — I25118 Atherosclerotic heart disease of native coronary artery with other forms of angina pectoris: Secondary | ICD-10-CM

## 2014-07-22 DIAGNOSIS — I739 Peripheral vascular disease, unspecified: Secondary | ICD-10-CM | POA: Insufficient documentation

## 2014-07-22 DIAGNOSIS — I10 Essential (primary) hypertension: Secondary | ICD-10-CM | POA: Diagnosis not present

## 2014-07-22 DIAGNOSIS — Z794 Long term (current) use of insulin: Secondary | ICD-10-CM | POA: Diagnosis not present

## 2014-07-22 DIAGNOSIS — R9431 Abnormal electrocardiogram [ECG] [EKG]: Secondary | ICD-10-CM

## 2014-07-22 DIAGNOSIS — E785 Hyperlipidemia, unspecified: Secondary | ICD-10-CM

## 2014-07-22 HISTORY — PX: NM MYOVIEW LTD: HXRAD82

## 2014-07-22 MED ORDER — AMINOPHYLLINE 25 MG/ML IV SOLN
125.0000 mg | Freq: Once | INTRAVENOUS | Status: AC
  Administered 2014-07-22: 125 mg via INTRAVENOUS

## 2014-07-22 MED ORDER — REGADENOSON 0.4 MG/5ML IV SOLN
0.4000 mg | Freq: Once | INTRAVENOUS | Status: AC
  Administered 2014-07-22: 0.4 mg via INTRAVENOUS

## 2014-07-22 MED ORDER — TECHNETIUM TC 99M SESTAMIBI GENERIC - CARDIOLITE
10.9000 | Freq: Once | INTRAVENOUS | Status: AC | PRN
  Administered 2014-07-22: 10.9 via INTRAVENOUS

## 2014-07-22 MED ORDER — TECHNETIUM TC 99M SESTAMIBI GENERIC - CARDIOLITE
30.6000 | Freq: Once | INTRAVENOUS | Status: AC | PRN
  Administered 2014-07-22: 31 via INTRAVENOUS

## 2014-07-22 NOTE — Procedures (Addendum)
[facility] [address] [place] [address] [phone]  Cardiology Nuclear Med Study  William Pacheco is a 64 y.o. male     MRN : [id number]     DOB: 11-13-1950  Procedure Date: 07/22/2014  Nuclear Med Background Indication for Stress Test:  Follow up CAD History:  CAD;CABG X6-2008;PVD w/leg stents;ICM;AFIB;Last NUC MPI on 08/13/2013-scar;EF=31% Cardiac Risk Factors: History of Smoking, Hypertension, IDDM Type 2, Lipids, Obesity and PVD  Symptoms:  Pt denies symptoms at this time.   Nuclear Pre-Procedure Caffeine/Decaff Intake:  12:00am NPO After: 10am   IV Site: R Forearm  IV 0.9% NS with Angio Cath:  22g  Chest Size (in):  50"  IV Started by: Berdie Ogren, RN  Height: 6' (1.829 m)  Cup Size: n/a  BMI:  Body mass index is 34.17 kg/(m^2). Weight:  252 lb (114.306 kg)   Tech Comments:  n/a    Nuclear Med Study 1 or 2 day study: 1 day  Stress Test Type:  Lexiscan  Order Authorizing Provider:  Bryan Lemma, MD   Resting Radionuclide: Technetium 49m Sestamibi  Resting Radionuclide Dose: 10.9 mCi   Stress Radionuclide:  Technetium 72m Sestamibi  Stress Radionuclide Dose: 30.6 mCi           Stress Protocol Rest HR: 64 Stress HR: 85  Rest BP: 128/74 Stress BP:143/91  Exercise Time (min): n/a METS: n/a          Dose of Adenosine (mg):  n/a Dose of Lexiscan: 0.4 mg  Dose of Atropine (mg): n/a Dose of Dobutamine: n/a mcg/kg/min (at max HR)  Stress Test Technologist: Ernestene Mention, CCT Nuclear Technologist: Gonzella Lex, CNMT   Rest Procedure:  Myocardial perfusion imaging was performed at rest 45 minutes following the intravenous administration of Technetium 62m Sestamibi. Stress Procedure:  The patient received IV Lexiscan 0.4 mg over 15-seconds.  Technetium 69m Sestamibi injected IV at 30-seconds.  Patient experienced shortness of breath, drop in blood pressure and was administered 125 mg of Aminophylline IV.  There were no significant changes with Lexiscan.  Quantitative spect images were obtained after a 45 minute delay.  Transient Ischemic Dilatation (Normal <1.22):  1.08 QGS EDV:  145 ml QGS ESV:  94 ml LV Ejection Fraction: 35%    Rest ECG: NSR, inferior lateral TWI.  Stress ECG: No significant ST segment change suggestive of prior inferior MI and mild peri-infarct ischemia.  QPS Raw Data Images:  Acquisition technically good; LVE. Stress Images:  There is decreased uptake in the inferior wall. Rest Images:  There is decreased uptake in the inferior wall less prominent compared to the stress images. Subtraction (SDS):  These findings are consistent with ischemia.  Impression Exercise Capacity:  Lexiscan with no exercise. BP Response:  Normal blood pressure response. Clinical Symptoms:  There is dyspnea. ECG Impression:  No significant ST segment change suggestive of ischemia. Comparison with Prior Nuclear Study: Compared to 08/13/13, minimal inferior ischemia new; otherwise no change.  Overall Impression:  High risk stress nuclear study with a moderate size, medium intensity, partially reversible inferior defect consistent with prior infarct and very mild peri-infarct ischemia; study classified as high risk due to reduced LV function.  LV Wall Motion:  Global hypokinesis and inferior akinesis.   Olga Millers, MD  07/22/2014 4:19 PM

## 2014-07-23 LAB — COMPREHENSIVE METABOLIC PANEL
ALBUMIN: 4.4 g/dL (ref 3.5–5.2)
ALT: 31 U/L (ref 0–53)
AST: 20 U/L (ref 0–37)
Alkaline Phosphatase: 53 U/L (ref 39–117)
BILIRUBIN TOTAL: 0.9 mg/dL (ref 0.2–1.2)
BUN: 25 mg/dL — AB (ref 6–23)
CO2: 33 meq/L — AB (ref 19–32)
Calcium: 9.7 mg/dL (ref 8.4–10.5)
Chloride: 102 mEq/L (ref 96–112)
Creat: 1.09 mg/dL (ref 0.50–1.35)
Glucose, Bld: 167 mg/dL — ABNORMAL HIGH (ref 70–99)
Potassium: 4.4 mEq/L (ref 3.5–5.3)
Sodium: 140 mEq/L (ref 135–145)
TOTAL PROTEIN: 7.3 g/dL (ref 6.0–8.3)

## 2014-07-23 LAB — LIPID PANEL
CHOL/HDL RATIO: 4.5 ratio
Cholesterol: 186 mg/dL (ref 0–200)
HDL: 41 mg/dL (ref >39)
LDL Cholesterol: 120 mg/dL — ABNORMAL HIGH (ref 0–99)
TRIGLYCERIDES: 127 mg/dL (ref <150)
VLDL: 25 mg/dL (ref 0–40)

## 2014-07-26 ENCOUNTER — Telehealth: Payer: Self-pay | Admitting: *Deleted

## 2014-07-26 DIAGNOSIS — IMO0002 Reserved for concepts with insufficient information to code with codable children: Secondary | ICD-10-CM

## 2014-07-26 DIAGNOSIS — E1140 Type 2 diabetes mellitus with diabetic neuropathy, unspecified: Secondary | ICD-10-CM

## 2014-07-26 DIAGNOSIS — E785 Hyperlipidemia, unspecified: Secondary | ICD-10-CM

## 2014-07-26 DIAGNOSIS — Z79899 Other long term (current) drug therapy: Secondary | ICD-10-CM

## 2014-07-26 DIAGNOSIS — E1165 Type 2 diabetes mellitus with hyperglycemia: Secondary | ICD-10-CM

## 2014-07-26 MED ORDER — ATORVASTATIN CALCIUM 40 MG PO TABS: 40.0000 mg | ORAL_TABLET | Freq: Every day | ORAL | Status: DC

## 2014-07-26 NOTE — Telephone Encounter (Signed)
-----   Message from David W Harding, MD sent at 07/23/2014  2:04 PM EST ----- Not sure what to make of this study -- probably is not to much different than before. However, this time the reader commented that there was a difference between stress & rest images.  In the setting of the concerning EKG changes, I think that we should consider going the next step to investigate this with a Cardiac Catheterization.  I would consider doing a Right & Left Heart Catheterization.  This way, we will fully understand your Heart artery & graft anatomy along with heart function. I will have the office contact you on Monday Jan 25 to discuss whether you would like to come in to discuss results or simply come in for catheterization.    HARDING, DAVID W, MD  

## 2014-07-26 NOTE — Telephone Encounter (Signed)
-----   Message from David W Harding, MD sent at 07/23/2014  2:15 PM EST ----- Total cholesterol and HDL plus triglycerides although pretty good. Unfortunately the LDL is still high.  I would try to increase atorvastatin/Lipitor to 40 mg daily. We can recheck labs in 3-4 months Kidney function is pretty stable, however it appears that your are just a bit dehydrated. Sugar control is borderline with glucose of 168.  HARDING, DAVID W, MD 

## 2014-07-26 NOTE — Telephone Encounter (Signed)
-----   Message from Marykay Lex, MD sent at 07/23/2014  2:15 PM EST ----- Total cholesterol and HDL plus triglycerides although pretty good. Unfortunately the LDL is still high.  I would try to increase atorvastatin/Lipitor to 40 mg daily. We can recheck labs in 3-4 months Kidney function is pretty stable, however it appears that your are just a bit dehydrated. Sugar control is borderline with glucose of 168.  Marykay Lex, MD

## 2014-07-26 NOTE — Telephone Encounter (Signed)
-----   Message from Marykay Lex, MD sent at 07/23/2014  2:04 PM EST ----- Not sure what to make of this study -- probably is not to much different than before. However, this time the reader commented that there was a difference between stress & rest images.  In the setting of the concerning EKG changes, I think that we should consider going the next step to investigate this with a Cardiac Catheterization.  I would consider doing a Right & Left Heart Catheterization.  This way, we will fully understand your Heart artery & graft anatomy along with heart function. I will have the office contact you on Monday Jan 25 to discuss whether you would like to come in to discuss results or simply come in for catheterization.    Marykay Lex, MD

## 2014-07-26 NOTE — Telephone Encounter (Signed)
Spoke to patient. l ABS AND MYOVIEW Result given . Verbalized understanding Patient would like to discuss with DR [facility] concerning cardiac cath- R & L

## 2014-07-28 ENCOUNTER — Ambulatory Visit (INDEPENDENT_AMBULATORY_CARE_PROVIDER_SITE_OTHER): Payer: 59 | Admitting: Cardiology

## 2014-07-28 ENCOUNTER — Encounter: Payer: Self-pay | Admitting: Cardiology

## 2014-07-28 VITALS — BP 136/72 | HR 65 | Ht 71.0 in | Wt 255.4 lb

## 2014-07-28 DIAGNOSIS — R9439 Abnormal result of other cardiovascular function study: Secondary | ICD-10-CM

## 2014-07-28 DIAGNOSIS — E1140 Type 2 diabetes mellitus with diabetic neuropathy, unspecified: Secondary | ICD-10-CM

## 2014-07-28 DIAGNOSIS — I25811 Atherosclerosis of native coronary artery of transplanted heart without angina pectoris: Secondary | ICD-10-CM

## 2014-07-28 DIAGNOSIS — R9431 Abnormal electrocardiogram [ECG] [EKG]: Secondary | ICD-10-CM

## 2014-07-28 DIAGNOSIS — I255 Ischemic cardiomyopathy: Secondary | ICD-10-CM

## 2014-07-28 DIAGNOSIS — I10 Essential (primary) hypertension: Secondary | ICD-10-CM

## 2014-07-28 DIAGNOSIS — E785 Hyperlipidemia, unspecified: Secondary | ICD-10-CM

## 2014-07-28 DIAGNOSIS — Z79899 Other long term (current) drug therapy: Secondary | ICD-10-CM

## 2014-07-28 DIAGNOSIS — R931 Abnormal findings on diagnostic imaging of heart and coronary circulation: Secondary | ICD-10-CM

## 2014-07-28 DIAGNOSIS — E1165 Type 2 diabetes mellitus with hyperglycemia: Secondary | ICD-10-CM

## 2014-07-28 DIAGNOSIS — I252 Old myocardial infarction: Secondary | ICD-10-CM

## 2014-07-28 DIAGNOSIS — IMO0002 Reserved for concepts with insufficient information to code with codable children: Secondary | ICD-10-CM

## 2014-07-28 NOTE — Patient Instructions (Signed)
Dr Herbie Baltimore has ordered the following test(s) to be done: 1. Please have your blood work done in April  Dr Herbie Baltimore wants you to follow-up in May 2016. You will receive a reminder letter in the mail two months in advance. If you don't receive a letter, please call our office to schedule the follow-up appointment.

## 2014-07-29 ENCOUNTER — Encounter: Payer: Self-pay | Admitting: Cardiology

## 2014-07-29 DIAGNOSIS — R9439 Abnormal result of other cardiovascular function study: Secondary | ICD-10-CM | POA: Insufficient documentation

## 2014-07-29 DIAGNOSIS — Z79899 Other long term (current) drug therapy: Secondary | ICD-10-CM | POA: Insufficient documentation

## 2014-07-29 NOTE — Assessment & Plan Note (Signed)
On statin. Lab Results  Component Value Date   CHOL 186 07/22/2014   HDL 41 07/22/2014   LDLCALC 120* 07/22/2014   TRIG 127 07/22/2014   CHOLHDL 4.5 07/22/2014   Need to work on aggressive dietary modification. We also talked about the importance of him actually trying to get some exercise. It is okay to try walking through a little bit of claudication , but not angina. We may need additional help with lipid control. We will recheck in May.

## 2014-07-29 NOTE — Assessment & Plan Note (Signed)
EF by her Myoview showed a slight drop with EF of 35%. We'll need to monitor very closely, as he is borderline in the ICD range.  I will likely need to check an echocardiogramwithin this year to get a reassessment of his valvular disease and ejection fraction.

## 2014-07-29 NOTE — Assessment & Plan Note (Signed)
Plan is to recheck lipids and chemistries were being on statin and ARB.

## 2014-07-29 NOTE — Assessment & Plan Note (Addendum)
We had a long extensive discussion about these findings and potential course of action. (~25-30 min)   I reviewed the images from the last 3 stress tests with them. There may be some mild peri-infarct ischemia noted on this current stress test that may not have been commented on during previous stress test. He is not symptomatic from an angina standpoint, it is more troubled by his claudication and angina. There is no obvious new ischemia noted on the stress test. He would be a very difficult patient for cardiac catheterization procedure because his extensive scar tissue in bilateral groins. In previous notes his vascular surgeon has indicated extreme difficulty with vascular access in the groins. I could not left radial approach as his left radial artery was used for radial approach would not be conducive to obtaining images of the LIMA graft.  In light of the fact that he is relatively asymptomatic, a stress test is not overly changed in comparison to the past 2 stress tests dating back to 2013, and that catheterization would be a very difficult procedure, the decision that we agreed upon was to continue his current cardiac medical management titrating as need be. Only in the setting of new or worsening anginal/heart failure symptoms would reconsider an invasive approach. He and his wife would both be reluctant to try to undergo another catheterization procedure, and I think he will be a very difficult endeavor.  I don't think that in the future a stress test is a good option for him however unless he is actively having symptoms. I would not react to "peri-infarct ischemia "in the inferior distribution. This is probably correlating to his known RCA occlusion. I would not doubt the possibility of the grafts to his right system being included and therefore he has some probable ischemia in that distribution due to incomplete collateralization.

## 2014-07-29 NOTE — Assessment & Plan Note (Signed)
Clearly he has suffered a sizable infarct in the past as demonstrated by several stress tests..  His preop Prior to CABG showed an occluded RCA.  Probably has a large distribution of the RCA territory as infarct. Perhaps mild number related ischemia due to inadequate collateralization.

## 2014-07-29 NOTE — Assessment & Plan Note (Addendum)
Overall seems relatively stable on current management. He is on full dose carvedilol,aspirin and statin.  Losartan replaced Ramipril. See discussion above.

## 2014-07-29 NOTE — Progress Notes (Signed)
PCP: Philis Fendt, MD  Clinic Note: Chief Complaint  Patient presents with  . Follow-up    Abnormal stress test    HPI: William Pacheco is a 64 y.o. male with a PMH below who presents today to discuss the results of his abnormal Myoview stress test. Was read as "high risk ". I saw him roughly 2 weeks ago for routine followup and noted new T-wave inversions in lateral leads. He is minimally active due to claudication symptoms, and therefore is not the greatest at describing anginal symptoms. He actually has his wife with him here today in order to discuss findings of the stress test and future plans.  Past Medical History  Diagnosis Date  . CAD (coronary artery disease), native coronary artery September 2008    Left main 50-60%, LAD 80% with D2 involved. RCA mid occlusion --> CABG  . 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 Ischemic cardiomyopathy September 2008    EF = 35-40%; THE 45-50% IN 2013 --> ECHO 03/03/13 - EF 40-45% LV: Moderate HK of basal-midlateral & inferior myocardium; c/w prior infarction in the distribution of the RCA or LCx. Mild-Mod MR with Calcified annulus. Mod TR  . History of MI (myocardial infarction) 2012    Noted by echocardiography, and nuclear stress testing. Known RCA occlusion prior to CABG  . PAF (paroxysmal atrial fibrillation) - initial presentation with RVR, and heart failure 2013    On a beta blocker, calcium channel blocker & Xarelto. Lexiscan Myoview 11/08/11 LV was enlarged with no evidence of ischemia. There was a moderate sized, moderate to severe in intensity fixed defect in the inferior wall suggestive of scar with no reversible ischemia. There was a basal septal akinesis w/severe global hypokinesis. EF = 31% (Which is different from the [facility] EF)  . Chronic diastolic CHF (congestive heart failure), NYHA class 2 2009, 2011; 2013    Exacerbated by A. fib RVR, currently on Lasix.  . Peripheral arterial  disease 2009, 2011; 2013    2009, 2011; 2013  . Renal artery stenosis, non-flow-limiting   . DM (diabetes mellitus), type 2 with neurological complications     Peripheral neuropathy  . Diabetic peripheral neuropathy associated with type 2 diabetes mellitus   . Hyperlipidemia LDL goal <70     On statin therapy  . Obesity (BMI 30.0-34.9)   . Hypertension associated with diabetes   . Erectile dysfunction associated with type 2 diabetes mellitus  2006  . Ischemic mitral regurgitation  1980s    Mild to moderate  . Thrombocytopenia      chronic  . Osteoarthritis of both knees 1990    Status post left knee a arthroscopy, currently Gaspard Isbell right knee  . History of hepatitis B 1980s  . Adopted     Adopted [V68.89]   Interval History: He has been pretty stable since I saw him 2 weeks ago. He continues to deny any real episodes of chest tightness or pressure with rest or exertion. He is still very much limited by his claudication. He denies any heart failure symptoms of PND, orthopnea or edema. He has mild baseline dyspnea but nothing that's new for the last 6 months. He basically says it feels the same as he had scrotal last 6 months and has not noticed anything new. Occasionally has some palpitations earlier but nothing significant. He denies any lightheadedness, dizziness, rapid irregular heartbeats, syncope/near-syncope or TIA /amaurosis fugax symptoms.  ROS: A  comprehensive was performed. Review of Systems  Constitutional: Positive for malaise/fatigue (His overall feels tired and not like he wants to do much -- feels lazy).  HENT: Negative for nosebleeds.   Respiratory: Positive for shortness of breath (Baseline mildly dyspneic).   Cardiovascular: Positive for claudication (Activity limiting claudication ; worsse in the right leg). Negative for chest pain.  Gastrointestinal: Negative for blood in stool and melena.  Genitourinary: Negative for hematuria.  Musculoskeletal: Positive for joint  pain.  Neurological: Negative for focal weakness and loss of consciousness.  Endo/Heme/Allergies: Does not bruise/bleed easily.  Psychiatric/Behavioral: Positive for depression (He seemed to have a general sense of dysthymia).  All other systems reviewed and are negative.   Current Outpatient Prescriptions on File Prior to Visit  Medication Sig Dispense Refill  . acetaminophen (TYLENOL) 500 MG tablet Take 500 mg by mouth every 6 (six) hours as needed.    Marland Kitchen aspirin 81 MG tablet Take 81 mg by mouth daily.    Marland Kitchen atorvastatin (LIPITOR) 40 MG tablet Take 1 tablet (40 mg total) by mouth daily. 30 tablet 6  . Calcium Carb-Cholecalciferol (CALCIUM 1000 + D PO) Take 2 tablets by mouth daily.    . calcium carbonate (OS-CAL) 600 MG TABS tablet Take 600 mg by mouth 2 (two) times daily with a meal. Take two tablets daily    . Canagliflozin (INVOKANA PO) Take 300 mg by mouth daily.     . carvedilol (COREG) 25 MG tablet Take 37.5 mg by mouth 2 (two) times daily with a meal. (1 and 1/2 tablets)    . Cyanocobalamin (VITAMIN B-12) 2000 MCG TBCR Take 3 tablets by mouth daily.    . furosemide (LASIX) 40 MG tablet Take 1 tablet (40 mg total) by mouth 2 (two) times daily. 60 tablet 9  . Glucosamine-Chondroit-Vit C-Mn (GLUCOSAMINE CHONDR 1500 COMPLX PO) Take 1,500 mg by mouth daily.    . Insulin Aspart (NOVOLOG [place]) Inject 10 Units into the skin 2 (two) times daily.    . insulin glargine (LANTUS) 100 UNIT/ML injection Inject 60 Units into the skin at bedtime.     Marland Kitchen KLOR-CON M10 10 MEQ tablet     . LANTUS SOLOSTAR 100 UNIT/ML Solostar Pen     . Magnesium Oxide 500 MG CAPS Take 1,000 mg by mouth.    . mupirocin ointment (BACTROBAN) 2 %     . omega-3 acid ethyl esters (LOVAZA) 1 G capsule Take 1 g by mouth daily.    . pregabalin (LYRICA) 100 MG capsule Take 100 mg by mouth 2 (two) times daily.    . Rivaroxaban (XARELTO) 20 MG TABS tablet Take 1 tablet (20 mg total) by mouth daily with supper. 30 tablet 0  . traMADol  (ULTRAM) 50 MG tablet Take 1 tablet (50 mg total) by mouth every 6 (six) hours as needed. 50 tablet 0  . vitamin C (ASCORBIC ACID) 500 MG tablet Take 1,500 mg by mouth daily.    . Vitamin D, Cholecalciferol, 1000 UNITS TABS Take 3,000 Units by mouth daily.    . vitamin E 400 UNIT capsule Take 1,200 Units by mouth daily.    . [DISCONTINUED] amLODipine (NORVASC) 10 MG tablet Take 10 mg by mouth daily.    . [DISCONTINUED] pioglitazone-metformin (ACTOPLUS MET) 15-850 MG per tablet Take 1 tablet by mouth 2 (two) times daily with a meal.    . [DISCONTINUED] simvastatin (ZOCOR) 40 MG tablet Take 40 mg by mouth every evening.     No current  facility-administered medications on file prior to visit.   Marland Kitchen losartan (COZAAR) 100 MG tablet    Sig: Take 100 mg by mouth daily.   Replaced Ramipril  No Known Allergies  SOCIAL AND FAMILY HISTORY REVIEWED IN EPIC -- notable change is that he just had a new grandchild in November -- his third grandson    Wt Readings from Last 3 Encounters:  07/28/14 255 lb 6.4 oz (115.849 kg)  07/22/14 252 lb (114.306 kg)  07/08/14 252 lb 12.8 oz (114.669 kg)    PHYSICAL EXAM BP 136/72 mmHg  Pulse 65  Ht _0  (1.803 m)  Wt 255 lb 6.4 oz (115.849 kg)  BMI 35.64 kg/m2 General appearance: alert, cooperative, appears stated age, no distress and mildly obese Neck: no adenopathy, no carotid bruit, no JVD and supple, symmetrical, trachea midline Lungs: clear to auscultation bilaterally, normal percussion bilaterally and Nonlabored, good air movement Heart: RRR, normal S1 and S2. Possible soft S4. Early peaking 1/6 SEM at the base radiating to carotid. Also 1/6 blowing HSM at left lower sternal border Abdomen: soft, non-tender; bowel sounds normal; no masses, no organomegaly and Mildly obese Extremities: No clubbing or cyanosis. The left lower leg as varying stages of healing at the op site. Pulses: Still diminished but palpable bilaterally. Neurologic: Grossly normal;  CN II through XII grossly intact   Adult ECG Report - n/a  Recent Labs:      Chemistry      Component Value Date/Time   NA 140 07/22/2014 1108   K 4.4 07/22/2014 1108   CL 102 07/22/2014 1108   CO2 33* 07/22/2014 1108   BUN 25* 07/22/2014 1108   CREATININE 1.09 07/22/2014 1108   CREATININE 0.74 09/17/2013 0410      Component Value Date/Time   CALCIUM 9.7 07/22/2014 1108   ALKPHOS 53 07/22/2014 1108   AST 20 07/22/2014 1108   ALT 31 07/22/2014 1108   BILITOT 0.9 07/22/2014 1108      ASSESSMENT / PLAN: Abnormal nuclear stress test: Moderate sized inferior infarct with mild peri-infarct ischemia; High-Risk do to ejection fraction of 35% with inferior akinesis; no notable change when compared to image We had a long extensive discussion about these findings and potential course of action. (~25-30 min)   I reviewed the images from the last 3 stress tests with them. There may be some mild peri-infarct ischemia noted on this current stress test that may not have been commented on during previous stress test. He is not symptomatic from an angina standpoint, it is more troubled by his claudication and angina. There is no obvious new ischemia noted on the stress test. He would be a very difficult patient for cardiac catheterization procedure because his extensive scar tissue in bilateral groins. In previous notes his vascular surgeon has indicated extreme difficulty with vascular access in the groins. I could not left radial approach as his left radial artery was used for radial approach would not be conducive to obtaining images of the LIMA graft.  In light of the fact that he is relatively asymptomatic, a stress test is not overly changed in comparison to the past 2 stress tests dating back to 2013, and that catheterization would be a very difficult procedure, the decision that we agreed upon was to continue his current cardiac medical management titrating as need be. Only in the setting of  new or worsening anginal/heart failure symptoms would reconsider an invasive approach. He and his wife would both be reluctant to try  to undergo another catheterization procedure, and I think he will be a very difficult endeavor.  I don't think that in the future a stress test is a good option for him however unless he is actively having symptoms. I would not react to "peri-infarct ischemia "in the inferior distribution. This is probably correlating to his known RCA occlusion. I would not doubt the possibility of the grafts to his right system being included and therefore he has some probable ischemia in that distribution due to incomplete collateralization.   Abnormal finding on EKG New T-wave inversions in lateral leads during his January visit. In the absence of symptoms stress test was performed and no obvious new ischemia found to warrant invasive evaluation   CAD- CABG x 6 9/08- Myoview 07/1024; Moderate Inf Infarct with very mild Peri-infarct ischemia Overall seems relatively stable on current management. He is on full dose carvedilol,aspirin and statin.  Losartan replaced Ramipril. See discussion above.   History of non-ST elevation myocardial infarction (NSTEMI) Clearly he has suffered a sizable infarct in the past as demonstrated by several stress tests..  His preop Prior to CABG showed an occluded RCA.  Probably has a large distribution of the RCA territory as infarct. Perhaps mild number related ischemia due to inadequate collateralization.   Hyperlipidemia with target LDL less than 70 On statin. Lab Results  Component Value Date   CHOL 186 07/22/2014   HDL 41 07/22/2014   LDLCALC 120* 07/22/2014   TRIG 127 07/22/2014   CHOLHDL 4.5 07/22/2014   Need to work on aggressive dietary modification. We also talked about the importance of him actually trying to get some exercise. It is okay to try walking through a little bit of claudication , but not angina. We may need additional  help with lipid control. We will recheck in May.   Essential hypertension Adequately controlled. No longer hypotensive. His ACE inhibitor was exchanged for losartan for unclear reason. Pressures remained stable.   Encounter for long-term (current) use of medications Plan is to recheck lipids and chemistries were being on statin and ARB.   Ischemic cardiomyopathy; EF roughly 40% EF by her Myoview showed a slight drop with EF of 35%. We'll need to monitor very closely, as he is borderline in the ICD range.  I will likely need to check an echocardiogramwithin this year to get a reassessment of his valvular disease and ejection fraction.     No orders of the defined types were placed in this encounter.     Followup:    Leonie Man, M.D., M.S. Interventional Cardiologist   Pager # [phone]

## 2014-07-29 NOTE — Assessment & Plan Note (Signed)
New T-wave inversions in lateral leads during his January visit. In the absence of symptoms stress test was performed and no obvious new ischemia found to warrant invasive evaluation

## 2014-07-29 NOTE — Assessment & Plan Note (Signed)
Adequately controlled. No longer hypotensive. His ACE inhibitor was exchanged for losartan for unclear reason. Pressures remained stable.

## 2014-08-05 ENCOUNTER — Ambulatory Visit (INDEPENDENT_AMBULATORY_CARE_PROVIDER_SITE_OTHER): Payer: 59 | Admitting: Podiatry

## 2014-08-05 DIAGNOSIS — B351 Tinea unguium: Secondary | ICD-10-CM

## 2014-08-05 DIAGNOSIS — M79673 Pain in unspecified foot: Secondary | ICD-10-CM

## 2014-08-05 NOTE — Progress Notes (Signed)
   Subjective:    Patient ID: William Pacheco, male    DOB: 07/05/1950, 63 y.o.   MRN: [id number]  HPI  Pt is here for nail debridement   Review of Systems     Objective:   Physical Exam:pulses are palpable bilateral. Nails are thick yellow dystrophic likely mycotic and painful palpation as well as debridement.        Assessment & Plan:  Assessment: Pain in limb secondary to onychomycosis 1 through 5 bilateral.  Plan: Debridement of nails 1 through 5 bilateral. 

## 2014-08-10 ENCOUNTER — Other Ambulatory Visit: Payer: Self-pay | Admitting: Cardiology

## 2014-08-10 NOTE — Telephone Encounter (Signed)
Rx(s) sent to pharmacy electronically.  

## 2014-08-19 LAB — HM DIABETES EYE EXAM

## 2014-08-25 ENCOUNTER — Telehealth: Payer: Self-pay | Admitting: Cardiology

## 2014-08-25 NOTE — Telephone Encounter (Signed)
Received records from [facility] for appointment on 11/08/14 with Dr Herbie Baltimore.  Records given to [facility] (medical records) for Dr Elissa Hefty schedule on 11/08/14.  lp

## 2014-08-30 LAB — HEMOGLOBIN A1C: Hgb A1c MFr Bld: 11.4 % — AB (ref 4.0–6.0)

## 2014-09-10 ENCOUNTER — Other Ambulatory Visit: Payer: Self-pay | Admitting: *Deleted

## 2014-09-10 ENCOUNTER — Encounter: Payer: Self-pay | Admitting: Endocrinology

## 2014-09-10 ENCOUNTER — Ambulatory Visit (INDEPENDENT_AMBULATORY_CARE_PROVIDER_SITE_OTHER): Payer: 59 | Admitting: Endocrinology

## 2014-09-10 VITALS — BP 118/72 | HR 78 | Temp 98.0°F | Resp 16 | Ht 71.0 in | Wt 250.0 lb

## 2014-09-10 DIAGNOSIS — E1165 Type 2 diabetes mellitus with hyperglycemia: Secondary | ICD-10-CM

## 2014-09-10 DIAGNOSIS — IMO0002 Reserved for concepts with insufficient information to code with codable children: Secondary | ICD-10-CM

## 2014-09-10 MED ORDER — INSULIN GLARGINE 300 UNIT/ML ~~LOC~~ SOPN: 75.0000 [IU] | PEN_INJECTOR | Freq: Every day | SUBCUTANEOUS | Status: DC

## 2014-09-10 MED ORDER — METFORMIN HCL ER 500 MG PO TB24: ORAL_TABLET | ORAL | Status: DC

## 2014-09-10 MED ORDER — INVOKANA 300 MG PO TABS: 300.0000 mg | ORAL_TABLET | Freq: Every day | ORAL | Status: DC

## 2014-09-10 NOTE — Patient Instructions (Addendum)
Split Lantus to 35 twice daily at breakfast and suppertime until finished.  TOUJEO: This will replace the Lantus.  Start taking 70 units in the morning; if the fasting blood sugar in the morning is still over 140 increased the dose to 75 units  Novolog 10 units in am with coffee and 10-15 at lunch based on what you are eating and 20 at supper, take before eating  Rotate the injection sites further away from current injection sites and may go up and down on the abdomen  Please check blood sugars at least half the time about 2 hours after any meal and 3 times per week on waking up. Please bring blood sugar monitor to each visit. Recommended blood sugar levels about 2 hours after meal is 140-180 and on waking up 90-130  Start taking Metformin 500 mg, 1 tablet with your main meal for 5 days. Occasionally this may initially cause loose stools or nausea. If  tolerating well after 5 days add a second Metformin tablet (500 mg) at the same time. Continue adding another tablet after 5 days days if no persistent nausea or diarrhea until reaching the maximum tolerated dose or the full dose of 3 tablets once daily.  Increase Invokana to 300 mg when finished with 100 mg  Chair exercises as discussed

## 2014-09-10 NOTE — Progress Notes (Signed)
Patient ID: William Pacheco, male   DOB: 1950-08-01, 64 y.o.   MRN: [id number]           Reason for Appointment: Consultation for Type 2 Diabetes  Referring physician:  History of Present Illness:          Diagnosis: Type 2 diabetes mellitus, date of diagnosis: 1999       Previous history: He was virtually asymptomatic at the time of diagnosis even though his blood sugar at a free clinic was 550  Metformin was prescribed initially for his treatment but he does not know why this was stopped at some point He also thinks he had taken various other oral medications but no other injectable drugs before starting insulin in 2007 or so  Recent history:  He has been followed by local internist for the last few years although he did see an endocrinologist about 10-15 years ago also His blood sugars have been persistently poorly controlled since at least 2012 with only occasional A1c readings below 9% Invokana was started about 2 years ago and he thinks it helped a little with the control He has been taking Lantus for several years and the dose was increased by 10 units about 2 weeks ago He takes NovoLog with meals but only 10 units at lunch and dinner He says he does not like to take insulin shots and does not usually take any at breakfast He thinks that his blood sugar goes up over 200 after his morning coffee even though he may not eat anything Usually trying to cut back on carbohydrates at breakfast and lunch       Oral hypoglycemic drugs the patient is taking are: Invokana 100 mg daily      Side effects from medications have been: None INSULIN regimen is described as: Lantus 70 units at bedtime, NovoLog 10 units daily-twice a day    Compliance with the medical regimen: Fair Hypoglycemia: none   Glucose monitoring:  done one time a day         Glucometer: One Touch.      Blood Glucose readings by time of day and averages from recall   PREMEAL Breakfast Lunch Dinner Bedtime  Overall     Glucose range: 145      Median:        POST-MEAL PC Breakfast PC Lunch PC Dinner  Glucose range: 250 175 255  Median:      Self-care: The diet that the patient has been following is: tries to limit carbs .     Meals: 3 meals per day. Breakfast is eggs, lunch is vegetable juice smoothie sometimes otherwise sandwich            Exercise: none         Dietician visit, most recent: 04/2014.               Weight history: 222-250  Wt Readings from Last 3 Encounters:  09/10/14 250 lb (113.399 kg)  07/28/14 255 lb 6.4 oz (115.849 kg)  07/22/14 252 lb (114.306 kg)    Glycemic control:   Lab Results  Component Value Date   HGBA1C 10.0* 09/17/2013   HGBA1C 9.8* 09/24/2011   HGBA1C * 03/07/2007    9.0 (NOTE)   The ADA recommends the following therapeutic goals for glycemic   control related to Hgb A1C measurement:   Goal of Therapy:   < 7.0% Hgb A1C   Action Suggested:  > 8.0% Hgb A1C   Ref:  Diabetes Care, 22, Suppl. 1, 1999   Lab Results  Component Value Date   LDLCALC 120* 07/22/2014   CREATININE 1.09 07/22/2014         Medication List       This list is accurate as of: 09/10/14 10:23 AM.  Always use your most recent med list.               acetaminophen 500 MG tablet  Commonly known as:  TYLENOL  Take 500 mg by mouth every 6 (six) hours as needed.     aspirin 81 MG tablet  Take 81 mg by mouth daily.     atorvastatin 40 MG tablet  Commonly known as:  LIPITOR  Take 1 tablet (40 mg total) by mouth daily.     CALCIUM 1000 + D PO  Take 2 tablets by mouth daily.     calcium carbonate 600 MG Tabs tablet  Commonly known as:  OS-CAL  Take 600 mg by mouth 2 (two) times daily with a meal. Take two tablets daily     carvedilol 25 MG tablet  Commonly known as:  COREG  Take 37.5 mg by mouth 2 (two) times daily with a meal. (1 and 1/2 tablets)     furosemide 40 MG tablet  Commonly known as:  LASIX  Take 1 tablet (40 mg total) by mouth 2 (two) times daily.      GLUCOSAMINE CHONDR 1500 COMPLX PO  Take 1,500 mg by mouth daily.     insulin glargine 100 UNIT/ML injection  Commonly known as:  LANTUS  Inject 70 Units into the skin at bedtime.     LANTUS SOLOSTAR 100 UNIT/ML Solostar Pen  Generic drug:  Insulin Glargine     INVOKANA 300 MG Tabs tablet  Generic drug:  canagliflozin     KLOR-CON M10 10 MEQ tablet  Generic drug:  potassium chloride     losartan 100 MG tablet  Commonly known as:  COZAAR  Take 100 mg by mouth daily.     Magnesium Oxide 500 MG Caps  Take 1,000 mg by mouth.     mupirocin ointment 2 %  Commonly known as:  BACTROBAN     NOVOLOG [place]  Inject 10 Units into the skin 2 (two) times daily.     omega-3 acid ethyl esters 1 G capsule  Commonly known as:  LOVAZA  Take 1 g by mouth daily.     pregabalin 100 MG capsule  Commonly known as:  LYRICA  Take 100 mg by mouth 2 (two) times daily.     rivaroxaban 20 MG Tabs tablet  Commonly known as:  XARELTO  Take 1 tablet (20 mg total) by mouth daily with supper.     traMADol 50 MG tablet  Commonly known as:  ULTRAM  Take 1 tablet (50 mg total) by mouth every 6 (six) hours as needed.     Vitamin B-12 2000 MCG Tbcr  Take 3 tablets by mouth daily.     vitamin C 500 MG tablet  Commonly known as:  ASCORBIC ACID  Take 1,500 mg by mouth daily.     Vitamin D (Cholecalciferol) 1000 UNITS Tabs  Take 3,000 Units by mouth daily.     vitamin E 400 UNIT capsule  Take 1,200 Units by mouth daily.        Allergies: No Known Allergies  Past Medical History  Diagnosis Date  . CAD (coronary artery disease), native coronary artery September 2008  Left main 50-60%, LAD 80% with D2 involved. RCA mid occlusion --> CABG  . 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 Ischemic cardiomyopathy September 2008    EF = 35-40%; THE 45-50% IN 2013 --> ECHO 03/03/13 - EF 40-45% LV: Moderate HK of basal-midlateral & inferior myocardium; c/w prior  infarction in the distribution of the RCA or LCx. Mild-Mod MR with Calcified annulus. Mod TR  . History of MI (myocardial infarction) 2012    Noted by echocardiography, and nuclear stress testing. Known RCA occlusion prior to CABG  . PAF (paroxysmal atrial fibrillation) - initial presentation with RVR, and heart failure 2013    On a beta blocker, calcium channel blocker & Xarelto. Lexiscan Myoview 11/08/11 LV was enlarged with no evidence of ischemia. There was a moderate sized, moderate to severe in intensity fixed defect in the inferior wall suggestive of scar with no reversible ischemia. There was a basal septal akinesis w/severe global hypokinesis. EF = 31% (Which is different from the [facility] EF)  . Chronic diastolic CHF (congestive heart failure), NYHA class 2 2009, 2011; 2013    Exacerbated by A. fib RVR, currently on Lasix.  . Peripheral arterial disease 2009, 2011; 2013    2009, 2011; 2013  . Renal artery stenosis, non-flow-limiting   . DM (diabetes mellitus), type 2 with neurological complications     Peripheral neuropathy  . Diabetic peripheral neuropathy associated with type 2 diabetes mellitus   . Hyperlipidemia LDL goal <70     On statin therapy  . Obesity (BMI 30.0-34.9)   . Hypertension associated with diabetes   . Erectile dysfunction associated with type 2 diabetes mellitus  2006  . Ischemic mitral regurgitation  1980s    Mild to moderate  . Thrombocytopenia      chronic  . Osteoarthritis of both knees 1990    Status post left knee a arthroscopy, currently [place] right knee  . History of hepatitis B 1980s  . Adopted     Adopted [R60.45]    Past Surgical History  Procedure Laterality Date  . Femoral endarterectomy  03-15-2008    Left EIA & CFA endarterectomy by Dr. Madilyn Fireman  . Knee arthroscopy Left 1990    For osteoarthritis pain.  . Tonsillectomy    . Pleural effusion drainage Right   . Coronary artery bypass graft  03/2007    CABG x 6 LIMA-LAD, left radial to  diagonal 1, SVG to OM1-OM2 sequential, SVG to PDA-PLA sequential.  . Transthoracic echocardiogram  November 2014    EF 40-45%. Moderate hypokinesis of the basal and mid lateral and inferior myocardium. Mild to moderate MR. Mild LA dilation.  . Transthoracic echocardiogram  07/26/2013    In setting of acute illness: EF 15-20%, atrial fibrillation; diffuse hypokinesis with inferolateral abnormalities consistent with ischemic heart disease; grade 3 diastolic dysfunction/restrictive physiology - elevated LVEDP/LAP..  . Transthoracic echocardiogram  09/09/2013    40%. Septal hypokinesis. Moderate to severely dilated LV. Mild LVH and diffuse hypokinesis. Mild to moderate MR.  Marland Kitchen Nm myoview ltd  08/13/2013    Moderate LV dilation. Abnormal study with all high-risk features, unchanged from October 2014. Inferior defect likely representing nontransmural scar. Suggesting nonischemic cardiopathy superimposed on known ischemic cardiomyopathy.; Severe global hypokinesis with EF of roughly 31%.  . Vascular surgery Bilateral 2006    pt reports about 4 vascular surgeries starting in 2006 and last was in 2013.  Marland Kitchen Patch angioplasty Left 09/16/2013  Procedure: PATCH ANGIOPLASTY OF LEFT BELOW THE KNEE POPLITEAL ARTERY;  Surgeon: Fransisco Hertz, MD;  Location: [facility] OR;  Service: Vascular;  Laterality: Left;  . Intraoperative arteriogram Left 09/16/2013    Procedure: INTRA OPERATIVE ARTERIOGRAM;  Surgeon: Fransisco Hertz, MD;  Location: [facility] OR;  Service: Vascular;  Laterality: Left;  . Groin dissection Left 09/16/2013    Procedure: GROIN EXPLORATION;  Surgeon: Fransisco Hertz, MD;  Location: [facility] OR;  Service: Vascular;  Laterality: Left;  . Endarterectomy popliteal Left 09/16/2013    Procedure: ENDARTERECTOMYOF LEFT POPLITEAL ARTERY;  Surgeon: Fransisco Hertz, MD;  Location: [facility] OR;  Service: Vascular;  Laterality: Left;  . Abdominal aortagram N/A 08/30/2011    Procedure: ABDOMINAL Ronny Flurry;  Surgeon: Fransisco Hertz, MD;  Location: [facility] CATH  LAB;  Service: Cardiovascular;  Laterality: N/A;  . Percutaneous stent intervention Left 08/30/2011    Procedure: PERCUTANEOUS STENT INTERVENTION;  Surgeon: Fransisco Hertz, MD;  Location: [facility] CATH LAB;  Service: Cardiovascular;  Laterality: Left;  . Lower extremity angiogram Left 09/07/2013    Procedure: LOWER EXTREMITY ANGIOGRAM;  Surgeon: Fransisco Hertz, MD;  Location: [facility] CATH LAB;  Service: Cardiovascular;  Laterality: Left;  . Nm myoview ltd  07/22/2014    EF ~35%. High-Risk study do to moderate size, medial intensity partially reversible defect consistent with prior infave High-Risk do to reduced LV function; inferior akinesia and global hypokinesis     Family History  Problem Relation Age of Onset  . Adopted: Yes  . Cancer Mother     Social History:  reports that he quit smoking about 9 years ago. His smoking use included Cigarettes. He started smoking about 41 years ago. He smoked 0.50 packs per day. He has never used smokeless tobacco. He reports that he does not drink alcohol or use illicit drugs.    Review of Systems       Vision is normal. Most recent eye exam was 2010, to see Dr Nile Riggs soon       Lipids: On Lipitor       Lab Results  Component Value Date   CHOL 186 07/22/2014   HDL 41 07/22/2014   LDLCALC 120* 07/22/2014   TRIG 127 07/22/2014   CHOLHDL 4.5 07/22/2014                  Skin: No rash or infections      Thyroid:  No  unusual fatigue.     The blood pressure has been controlled with Losartan and Carvedilol     No swelling of feet.     No shortness of breath or chest tightness  on exertion.     Bowel habits: Normal.      No joint  pains.         Has a history of slight Numbness in his toes, no significant pain or burning Tingling hand present especially on the right side.  He apparently was treated for carpal tunnel syndrome with surgery but it did not help much    Calf pain on walking is present    LABS:  No visits with results within 1 Week(s)  from this visit. Latest known visit with results is:  Office Visit on 07/08/2014  Component Date Value Ref Range Status  . Cholesterol 07/22/2014 186  0 - 200 mg/dL Final   Comment: ATP III Classification:       < 200        mg/dL  Desirable      200 - 239     mg/dL        Borderline High      >= 240        mg/dL        High     . Triglycerides 07/22/2014 127  <150 mg/dL Final  . HDL 16/04/9603 41  >39 mg/dL Final  . Total CHOL/HDL Ratio 07/22/2014 4.5   Final  . VLDL 07/22/2014 25  0 - 40 mg/dL Final  . LDL Cholesterol 07/22/2014 120* 0 - 99 mg/dL Final   Comment:   Total Cholesterol/HDL Ratio:CHD Risk                        Coronary Heart Disease Risk Table                                        Men       Women          1/2 Average Risk              3.4        3.3              Average Risk              5.0        4.4           2X Average Risk              9.6        7.1           3X Average Risk             23.4       11.0 Use the calculated Patient Ratio above and the CHD Risk table  to determine the patient's CHD Risk. ATP III Classification (LDL):       < 100        mg/dL         Optimal      540 - 129     mg/dL         Near or Above Optimal      130 - 159     mg/dL         Borderline High      160 - 189     mg/dL         High       > 981        mg/dL         Very High     . Sodium 07/22/2014 140  135 - 145 mEq/L Final  . Potassium 07/22/2014 4.4  3.5 - 5.3 mEq/L Final  . Chloride 07/22/2014 102  96 - 112 mEq/L Final  . CO2 07/22/2014 33* 19 - 32 mEq/L Final  . Glucose, Bld 07/22/2014 167* 70 - 99 mg/dL Final  . BUN 19/14/7829 25* 6 - 23 mg/dL Final  . Creat 56/21/3086 1.09  0.50 - 1.35 mg/dL Final  . Total Bilirubin 07/22/2014 0.9  0.2 - 1.2 mg/dL Final  . Alkaline Phosphatase 07/22/2014 53  39 - 117 U/L Final  . AST 07/22/2014 20  0 - 37 U/L Final  . ALT 07/22/2014 31  0 - 53 U/L Final  . Total Protein 07/22/2014 7.3  6.0 - 8.3 g/dL  Final  . Albumin  07/22/2014 4.4  3.5 - 5.2 g/dL Final  . Calcium 16/04/9603 9.7  8.4 - 10.5 mg/dL Final    Physical Examination:  BP 118/72 mmHg  Pulse 78  Temp(Src) 98 F (36.7 C)  Resp 16  Ht  (1.803 m)  Wt 250 lb (113.399 kg)  BMI 34.88 kg/m2  SpO2 97%  GENERAL:         Patient has generalized obesity.   HEENT:         Eye exam shows normal external appearance. Fundus exam shows no retinopathy. Oral exam shows normal mucosa .  NECK:         General:  Neck exam shows no lymphadenopathy. Carotids are normal to palpation and bilateral bruits heard.  Thyroid is not enlarged and no nodules felt.   LUNGS:         Chest is symmetrical. Lungs are clear to auscultation.Marland Kitchen   HEART:         Heart sounds:  S1 and S2 are normal. No murmurs or clicks heard., no S3 or S4.   ABDOMEN:   There is no distention present. Liver and spleen are not palpable. No other mass or tenderness present.  EXTREMITIES:     There is no edema. No skin lesions present.Marland Kitchen  NEUROLOGICAL:  muscle power appears normal.  Vibration sense is markedly reduced in toes. Ankle and biceps jerks are absent bilaterally.          Diabetic foot exam:  He has marked onychomycosis along with slightly yellowish scaly dry skin changes on his distal feet and plantar surfaces especially left. Monofilament sensation normal to slightly decreased on the toes and more so on the left fourth and fifth toes Tinel sign positive bilaterally Pedal pulses absent Vascular: Popliteal pulses absent MUSCULOSKELETAL:       There is no enlargement or deformity of the joints. Spine is normal to inspection.Marland Kitchen   SKIN:       No rash or lesions of concern.        ASSESSMENT:  Diabetes type 2, uncontrolled with insulin resistance and moderate obesity.    His A1c has been persistently high and ranging from 8.6-13+ over the last 3 years and most recently 11.4 He is a regimen of mostly basal insulin with Lantus and not clear this is adequately controlling blood sugars  towards the evening hours, fasting readings are relatively good with bedtime dose He has significant increase in blood sugars after meals even with breakfast when he does not have any carbohydrate; is on inadequate doses of mealtime insulin Also he is not on any metformin which would be useful as an insulin sensitizer Currently on 100 mg of Invokana with only fair control and needs a larger dose. He is not very active and currently not able to exercise much because of leg pains He also may benefit from overall detailed diabetes education with the nurse educator  Complications: Peripheral neuropathy, cardiovascular disease, peripheral vascular disease  HYPERLIPIDEMIA: Not controlled with 40 mg Lipitor alone with LDL 120  Hypertension: Currently well managed and is on losartan as part of his regimen  Carpal tunnel syndrome right side.  Currently treated with Lyrica.  However her symptoms are not adequately controlled Not clear if he may be getting weight gain from Lyrica also   PLAN:   Start taking Lantus twice a day until finished and then switched to Toujeo 70 units in the morning.  Discussed actions of Toujeo and differences  between this and Lantus.  She can try this once a day and preferably take it in the morning.  Discussed adjusting the Toujeo dose every week by 5 units to keep morning sugar at least under 140.  Increase mealtime insulin coverage with NovoLog.  Units to take at least 10 units of insulin in the morning to prevent rise in blood sugar that he has.  Also will need to increase the coverage for lunch especially if he is eating some carbohydrate and increased the dose to 20 units at suppertime at least  Start monitoring blood sugars regularly after meals also and bring monitor for download  Review day-to-day management with nurse educator and 10 days  Increase Invokana to 300 mg  Start metformin ER as discussed and titrate up to 1500 mg.  Given him detailed information on  metformin and discussed action on insulin sensitivity as well as benefits long-term  Consider adding Zetia or WelChol for hyperlipidemia and his goal for LDL should be 70  Chair exercises given   Patient Instructions  Split Lantus to 35 twice daily at breakfast and suppertime until finished.  TOUJEO: This will replace the Lantus.  Start taking 70 units in the morning; if the fasting blood sugar in the morning is still over 140 increased the dose to 75 units  Novolog 10 units in am with coffee and 10-15 at lunch based on what you are eating and 20 at supper, take before eating  Rotate the injection sites further away from current injection sites and may go up and down on the abdomen  Please check blood sugars at least half the time about 2 hours after any meal and 3 times per week on waking up. Please bring blood sugar monitor to each visit. Recommended blood sugar levels about 2 hours after meal is 140-180 and on waking up 90-130  Start taking Metformin 500 mg, 1 tablet with your main meal for 5 days. Occasionally this may initially cause loose stools or nausea. If  tolerating well after 5 days add a second Metformin tablet (500 mg) at the same time. Continue adding another tablet after 5 days days if no persistent nausea or diarrhea until reaching the maximum tolerated dose or the full dose of 3 tablets once daily.  Increase Invokana to 300 mg when finished with 100 mg  Chair exercises as discussed  Counseling time over 50% of today's 60 minute visit  Josephanthony Tindel 09/10/2014, 10:23 AM   Note: This office note was prepared with Insurance underwriter. Any transcriptional errors that result from this process are unintentional.

## 2014-09-13 ENCOUNTER — Telehealth: Payer: Self-pay | Admitting: Endocrinology

## 2014-09-13 NOTE — Telephone Encounter (Signed)
lmtcb

## 2014-09-13 NOTE — Telephone Encounter (Signed)
Patient would like for Bjorn Loser to please call him when she gets a moment    Thank You

## 2014-09-16 ENCOUNTER — Encounter: Payer: Self-pay | Admitting: Family

## 2014-09-16 ENCOUNTER — Telehealth: Payer: Self-pay | Admitting: Endocrinology

## 2014-09-16 NOTE — Telephone Encounter (Signed)
The toujeo is not covered by his insurance so he will stay on lantus. Is the dosage needing to be changed

## 2014-09-17 ENCOUNTER — Ambulatory Visit: Payer: 59 | Admitting: Family

## 2014-09-17 ENCOUNTER — Inpatient Hospital Stay (HOSPITAL_COMMUNITY): Admission: RE | Admit: 2014-09-17 | Payer: 59 | Source: Ambulatory Visit

## 2014-09-17 NOTE — Telephone Encounter (Signed)
See note below and please advise.

## 2014-09-17 NOTE — Telephone Encounter (Signed)
Pt advised of note below 

## 2014-09-17 NOTE — Telephone Encounter (Signed)
Lantus  35 twice daily

## 2014-09-17 NOTE — Telephone Encounter (Signed)
Pt does not need a refill at this time.

## 2014-09-22 ENCOUNTER — Encounter: Payer: 59 | Attending: Endocrinology | Admitting: Nutrition

## 2014-09-22 DIAGNOSIS — Z794 Long term (current) use of insulin: Secondary | ICD-10-CM | POA: Diagnosis not present

## 2014-09-22 DIAGNOSIS — E1140 Type 2 diabetes mellitus with diabetic neuropathy, unspecified: Secondary | ICD-10-CM | POA: Insufficient documentation

## 2014-09-22 DIAGNOSIS — E1165 Type 2 diabetes mellitus with hyperglycemia: Secondary | ICD-10-CM | POA: Insufficient documentation

## 2014-09-22 DIAGNOSIS — IMO0002 Reserved for concepts with insufficient information to code with codable children: Secondary | ICD-10-CM

## 2014-09-22 DIAGNOSIS — Z79899 Other long term (current) drug therapy: Secondary | ICD-10-CM | POA: Insufficient documentation

## 2014-09-22 DIAGNOSIS — Z713 Dietary counseling and surveillance: Secondary | ICD-10-CM | POA: Diagnosis not present

## 2014-09-22 NOTE — Progress Notes (Signed)
Patient did not bring meter.  Says FBSs are between 150-170, with occasional 200.  Insulin dose:  Lantus 35u bid.  Novolog 10u ac . Bfast, and 15uacL, and 20u acS.  SBGM:  Did not bring meter.  Says FBSs as above, and acS:  "ussually less--140s"  He is not testing after meals.  Bfast:  Smoothie of beets, lemon, celery cucumber, apple, carrots.  2 cups of coffee with sweetened creamer Lunch: sandwich with side of chips or pretzels.  Discussed the importance of bringing down FBSs.  Reviewed the protocol for this.  He will increase the PM dose of Lantus by 2u q 3 days until FBSs come down below 120.  He reported good understanding of this and agreed to call me in one week with insulin doses and blood sugar readings in the AM.   Once the FBSs come down, I will have him test acL and acS and adjust meal time insulin as needed .  He agreed to do this.

## 2014-09-22 NOTE — Patient Instructions (Signed)
Increase PM dose of lantus by 2u every 3 days until morning blood sugars are below 120.   Call in one week with blood sugar readings.

## 2014-10-01 ENCOUNTER — Ambulatory Visit: Payer: 59 | Admitting: Endocrinology

## 2014-10-01 DIAGNOSIS — Z0289 Encounter for other administrative examinations: Secondary | ICD-10-CM

## 2014-10-07 ENCOUNTER — Encounter: Payer: Self-pay | Admitting: Family

## 2014-10-08 ENCOUNTER — Ambulatory Visit (INDEPENDENT_AMBULATORY_CARE_PROVIDER_SITE_OTHER): Payer: 59 | Admitting: Family

## 2014-10-08 ENCOUNTER — Encounter: Payer: Self-pay | Admitting: Family

## 2014-10-08 ENCOUNTER — Ambulatory Visit (HOSPITAL_COMMUNITY)
Admission: RE | Admit: 2014-10-08 | Discharge: 2014-10-08 | Disposition: A | Discharge status: Home / Self Care | Payer: 59 | Source: Ambulatory Visit | Attending: Family | Admitting: Family

## 2014-10-08 VITALS — BP 110/67 | HR 73 | Resp 16 | Ht 70.5 in | Wt 251.0 lb

## 2014-10-08 DIAGNOSIS — E1159 Type 2 diabetes mellitus with other circulatory complications: Secondary | ICD-10-CM

## 2014-10-08 DIAGNOSIS — Z87891 Personal history of nicotine dependence: Secondary | ICD-10-CM | POA: Insufficient documentation

## 2014-10-08 DIAGNOSIS — I739 Peripheral vascular disease, unspecified: Secondary | ICD-10-CM | POA: Insufficient documentation

## 2014-10-08 DIAGNOSIS — Z95828 Presence of other vascular implants and grafts: Secondary | ICD-10-CM

## 2014-10-08 DIAGNOSIS — Z9889 Other specified postprocedural states: Secondary | ICD-10-CM | POA: Diagnosis not present

## 2014-10-08 DIAGNOSIS — E1151 Type 2 diabetes mellitus with diabetic peripheral angiopathy without gangrene: Secondary | ICD-10-CM

## 2014-10-08 NOTE — Progress Notes (Signed)
[facility] [place] HISTORY AND PHYSICAL -PAD  History of Present Illness William Pacheco is a 64 y.o. male patient of Dr. Bridgett Larsson who returns for follow up for left leg claudication. He is s/p Left groin, above-the-knee and below-the-knee popliteal artery exploration, Left below-the-knee popliteal artery endarterectomy with bovine patch angioplasty, Open cannulation of left common femoral artery Left leg runoff on 09/16/2013.  He is also s/p R fem-pop BPG by Dr. Amedeo Plenty and L distal SFA stenting (08/30/11). 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  Pt walks about 30 yards and left calf cramps, resolves with rest, this has not changed. A barrier to walking is right knee OA pain for which he receives and injection that does not seem to be a corticosteroid as he states it does not increase his blood sugar.  The fissure in his left heel has improved, was advised by his podiatrist to apply Neosporin daily.  Left arm is a harvest site for a vessel for his 6 vessel CABG in 2008, left radial and ulnar pulses are not palpable, left brachial pulse is palpable. Pt denies tingling, numbness, or pain in either hand or arm.  The patient denies New Medical or Surgical History.  Pt Diabetic: Yes, states last A1C was worse than the previous 9.1, admits to not eating correctly, he is seeing Dr. Dwyane Dee, endocrinologist, since March 2016 Pt smoker: former smoker, quit in 2006  Pt meds include: Statin :Yes Betablocker: Yes ASA: no Other anticoagulants/antiplatelets: taking Xaralto for history of atrial fib      Past Medical History  Diagnosis Date  . CAD (coronary artery disease), native coronary artery September 2008    Left main 50-60%, LAD 80% with D2 involved. RCA mid occlusion --> CABG  . 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 Ischemic cardiomyopathy September 2008    EF = 35-40%; THE 45-50%  IN 2013 --> ECHO 03/03/13 - EF 40-45% LV: Moderate HK of basal-midlateral & inferior myocardium; c/w prior infarction in the distribution of the RCA or LCx. Mild-Mod MR with Calcified annulus. Mod TR  . History of MI (myocardial infarction) 2012    Noted by echocardiography, and nuclear stress testing. Known RCA occlusion prior to CABG  . PAF (paroxysmal atrial fibrillation) - initial presentation with RVR, and heart failure 2013    On a beta blocker, calcium channel blocker & Xarelto. Lexiscan Myoview 11/08/11 LV was enlarged with no evidence of ischemia. There was a moderate sized, moderate to severe in intensity fixed defect in the inferior wall suggestive of scar with no reversible ischemia. There was a basal septal akinesis w/severe global hypokinesis. EF = 31% (Which is different from the [facility] EF)  . Chronic diastolic CHF (congestive heart failure), NYHA class 2 2009, 2011; 2013    Exacerbated by A. fib RVR, currently on Lasix.  . Peripheral arterial disease 2009, 2011; 2013    2009, 2011; 2013  . Renal artery stenosis, non-flow-limiting   . DM (diabetes mellitus), type 2 with neurological complications     Peripheral neuropathy  . Diabetic peripheral neuropathy associated with type 2 diabetes mellitus   . Hyperlipidemia LDL goal <70     On statin therapy  . Obesity (BMI 30.0-34.9)   . Hypertension associated with diabetes   . Erectile dysfunction associated with type 2 diabetes mellitus  2006  . Ischemic mitral regurgitation  1980s  Mild to moderate  . Thrombocytopenia      chronic  . Osteoarthritis of both knees 1990    Status post left knee a arthroscopy, currently [place] right knee  . History of hepatitis B 1980s  . Adopted     Adopted [V68.89]    Social History History  Substance Use Topics  . Smoking status: Former Smoker -- 0.50 packs/day    Types: Cigarettes    Start date: 07/02/1973    Quit date: 07/02/2005  . Smokeless tobacco: Never Used  . Alcohol Use: No     Family History Family History  Problem Relation Age of Onset  . Adopted: Yes  . Cancer Mother     Past Surgical History  Procedure Laterality Date  . Femoral endarterectomy  03-15-2008    Left EIA & CFA endarterectomy by Dr. Amedeo Plenty  . Knee arthroscopy Left 1990    For osteoarthritis pain.  . Tonsillectomy    . Pleural effusion drainage Right   . Coronary artery bypass graft  03/2007    CABG x 6 LIMA-LAD, left radial to diagonal 1, SVG to OM1-OM2 sequential, SVG to PDA-PLA sequential.  . Transthoracic echocardiogram  November 2014    EF 40-45%. Moderate hypokinesis of the basal and mid lateral and inferior myocardium. Mild to moderate MR. Mild LA dilation.  . Transthoracic echocardiogram  07/26/2013    In setting of acute illness: EF 15-20%, atrial fibrillation; diffuse hypokinesis with inferolateral abnormalities consistent with ischemic heart disease; grade 3 diastolic dysfunction/restrictive physiology - elevated LVEDP/LAP..  . Transthoracic echocardiogram  09/09/2013    40%. Septal hypokinesis. Moderate to severely dilated LV. Mild LVH and diffuse hypokinesis. Mild to moderate MR.  Marland Kitchen Nm myoview ltd  08/13/2013    Moderate LV dilation. Abnormal study with all high-risk features, unchanged from October 2014. Inferior defect likely representing nontransmural scar. Suggesting nonischemic cardiopathy superimposed on known ischemic cardiomyopathy.; Severe global hypokinesis with EF of roughly 31%.  . Vascular surgery Bilateral 2006    pt reports about 4 vascular surgeries starting in 2006 and last was in 2013.  Marland Kitchen Patch angioplasty Left 09/16/2013    Procedure: PATCH ANGIOPLASTY OF LEFT BELOW THE KNEE POPLITEAL ARTERY;  Surgeon: Conrad Woodworth, MD;  Location: [place];  Service: Vascular;  Laterality: Left;  . Intraoperative arteriogram Left 09/16/2013    Procedure: INTRA OPERATIVE ARTERIOGRAM;  Surgeon: Conrad Kronenwetter, MD;  Location: [place];  Service: Vascular;  Laterality: Left;  . Groin  dissection Left 09/16/2013    Procedure: GROIN EXPLORATION;  Surgeon: Conrad Duson, MD;  Location: [place];  Service: Vascular;  Laterality: Left;  . Endarterectomy popliteal Left 09/16/2013    Procedure: ENDARTERECTOMYOF LEFT POPLITEAL ARTERY;  Surgeon: Conrad [place], MD;  Location: [place];  Service: Vascular;  Laterality: Left;  . Abdominal aortagram N/A 08/30/2011    Procedure: ABDOMINAL Maxcine Ham;  Surgeon: Conrad [place]oning, MD;  Location[facility] CATH LAB;  Service: Cardiovascular;  Laterality: N/A;  . Percutaneous stent intervention Left 08/30/2011    Procedure: PERCUTANEOUS STENT INTERVENTION;  Surgeon: Conrad Cucumber, MD;  Location: [facility] CATH LAB;  Service: Cardiovascular;  Laterality: Left;  . Lower extremity angiogram Left 09/07/2013    Procedure: LOWER EXTREMITY ANGIOGRAM;  Surgeon: Conrad [place], MD;  Location: [facility] CATH LAB;  Service: Cardiovascular;  Laterality: Left;  . Nm myoview ltd  07/22/2014    EF ~35%. High-Risk study do to moderate size, medial intensity partially reversible defect consistent with prior infave High-Risk do  to reduced LV function; inferior akinesia and global hypokinesis     No Known Allergies  Current Outpatient Prescriptions  Medication Sig Dispense Refill  . acetaminophen (TYLENOL) 500 MG tablet Take 500 mg by mouth every 6 (six) hours as needed.    Marland Kitchen aspirin 81 MG tablet Take 81 mg by mouth daily.    Marland Kitchen atorvastatin (LIPITOR) 40 MG tablet Take 1 tablet (40 mg total) by mouth daily. 30 tablet 6  . Calcium Carb-Cholecalciferol (CALCIUM 1000 + D PO) Take 2 tablets by mouth daily.    . calcium carbonate (OS-CAL) 600 MG TABS tablet Take 600 mg by mouth 2 (two) times daily with a meal. Take two tablets daily    . carvedilol (COREG) 25 MG tablet Take 37.5 mg by mouth 2 (two) times daily with a meal. (1 and 1/2 tablets)    . Cyanocobalamin (VITAMIN B-12) 2000 MCG TBCR Take 3 tablets by mouth daily.    . furosemide (LASIX) 40 MG tablet Take 1 tablet (40 mg total) by mouth 2 (two) times  daily. 60 tablet 11  . Glucosamine-Chondroit-Vit C-Mn (GLUCOSAMINE CHONDR 1500 COMPLX PO) Take 1,500 mg by mouth daily.    . Insulin Aspart (NOVOLOG [place]) Inject 10 Units into the skin 2 (two) times daily.    . Insulin Glargine (TOUJEO SOLOSTAR) 300 UNIT/ML SOPN Inject 75 Units into the skin daily. 9 mL 3  . INVOKANA 300 MG TABS tablet Take 300 mg by mouth daily before breakfast. 30 tablet 3  . KLOR-CON M10 10 MEQ tablet     . losartan (COZAAR) 100 MG tablet Take 100 mg by mouth daily.    . Magnesium Oxide 500 MG CAPS Take 1,000 mg by mouth.    . metFORMIN (GLUCOPHAGE XR) 500 MG 24 hr tablet TAKE 3 TABLETS DAILY 90 tablet 3  . mupirocin ointment (BACTROBAN) 2 %     . omega-3 acid ethyl esters (LOVAZA) 1 G capsule Take 1 g by mouth daily.    . pregabalin (LYRICA) 100 MG capsule Take 100 mg by mouth 2 (two) times daily.    . Rivaroxaban (XARELTO) 20 MG TABS tablet Take 1 tablet (20 mg total) by mouth daily with supper. 30 tablet 0  . traMADol (ULTRAM) 50 MG tablet Take 1 tablet (50 mg total) by mouth every 6 (six) hours as needed. (Patient not taking: Reported on 09/10/2014) 50 tablet 0  . vitamin C (ASCORBIC ACID) 500 MG tablet Take 1,500 mg by mouth daily.    . Vitamin D, Cholecalciferol, 1000 UNITS TABS Take 3,000 Units by mouth daily.    . vitamin E 400 UNIT capsule Take 1,200 Units by mouth daily.    . [DISCONTINUED] amLODipine (NORVASC) 10 MG tablet Take 10 mg by mouth daily.    . [DISCONTINUED] pioglitazone-metformin (ACTOPLUS MET) 15-850 MG per tablet Take 1 tablet by mouth 2 (two) times daily with a meal.    . [DISCONTINUED] simvastatin (ZOCOR) 40 MG tablet Take 40 mg by mouth every evening.     No current facility-administered medications for this visit.    ROS: See HPI for pertinent positives and negatives.   Physical Examination  Filed Vitals:   10/08/14 1351  BP: 110/67  Pulse: 73  Resp: 16  Height: 5' 10.5" (1.791 m)  Weight: 251 lb (113.853 kg)  SpO2: 98%   Body mass  index is 35.49 kg/(m^2).   General: A&O x 3, WDWN, obese male, malodorous body odor.  Pulmonary: Sym exp, good air movt,  CTAB, no rales, rhonchi, or wheezing  Cardiac: Regualr rate and rhythm, no detected murmur. Aorta is not palpable.  Vascular: Vessel Right Left  Radial  Palpable Not Palpable  Brachial  Palpable  Palpable  Carotid Palpable, without bruit Palpable, without bruit  Aorta Not palpable N/A  Femoral Not Palpable faintly Palpable  Popliteal Not palpable Not palpable  PT not Palpable Not Palpable  DP Not Palpable Not Palpable   Gastrointestinal: soft, NTND, -G/R, - HSM, - masses palpated, - CVAT B  Musculoskeletal: M/S 5/5 throughout , Extremities without ischemic changes , no ulcers or gangrene in either foot. Fissures in left heel have healed, scaly plantar surfaces of both feet.  Neurologic: Pain and light touch intact in extremities , Motor exam as listed above          Non-Invasive Vascular Imaging: DATE: 10/08/2014 ABI: RIGHT 0.53 (06/04/14, 0.68), Waveforms: monophasic, TBI: 0.40;  LEFT 0.26 (0.37), Waveforms: monophasic, TBI: dampened waveform   ASSESSMENT: PRAISE DOLECKI is a 64 y.o. male who  s/p Left groin, above-the-knee and below-the-knee popliteal artery exploration, Left below-the-knee popliteal artery endarterectomy with bovine patch angioplasty, Open cannulation of left common femoral artery Left leg runoff on 09/16/2013.  He is also s/p R fem-pop BPG by Dr. Amedeo Plenty and L distal SFA stenting (08/30/11). 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  Pt walks about 30 yards and left calf cramps, resolves with rest, this has not changed. A barrier to walking is right knee OA pain for which he receives and injection that does not seem to be a corticosteroid as he states it does not increase his blood sugar.  The fissure in his left heel has improved, was  advised by his podiatrist to apply Neosporin daily.  Left arm is a harvest site for a vessel for his 6 vessel CABG in 2008, left radial and ulnar pulses are not palpable, left brachial pulse is palpable. Pt denies tingling, numbness, or pain in either hand or arm.  Bilateral ABI's are worse than 6 months ago: right indicates severe arterial occlusive disease and left indicates critical limb ischemia. Despite the left critical limb ischemia by ABI, the fissures in his left heel have mostly resolved.  Face to face time with patient was 25 minutes. Over 50% of this time was spent on counseling and coordination of care.   PLAN:  Daily seated leg exercises demonstrated and discussed since he has barriers to walking.  I discussed in depth with the patient the nature of atherosclerosis, and emphasized the importance of maximal medical management including strict control of blood pressure, blood glucose, and lipid levels, obtaining regular exercise, and continued cessation of smoking.  The patient is aware that without maximal medical management the underlying atherosclerotic disease process will progress, limiting the benefit of any interventions.  Based on the patient's vascular studies and examination, and after discussing with Dr. Bridgett Larsson, pt will return to clinic in 1-4 weeks with Dr. Bridgett Larsson to discuss pt's peripheral vascular status and options.  The patient was given information about PAD including signs, symptoms, treatment, what symptoms should prompt the patient to seek immediate medical care, and risk reduction measures to take.  Clemon Chambers, RN, MSN, FNP-C [facility] Phone: [phone]  Clinic MD: Bridgett Larsson  10/08/2014 1:52 PM

## 2014-10-08 NOTE — Patient Instructions (Signed)

## 2014-10-12 ENCOUNTER — Other Ambulatory Visit: Payer: Self-pay | Admitting: *Deleted

## 2014-10-12 MED ORDER — INSULIN GLARGINE 100 UNIT/ML SOLOSTAR PEN: 75.0000 [IU] | PEN_INJECTOR | Freq: Every day | SUBCUTANEOUS | Status: DC

## 2014-10-14 ENCOUNTER — Telehealth: Payer: Self-pay | Admitting: *Deleted

## 2014-10-14 ENCOUNTER — Ambulatory Visit: Payer: No Typology Code available for payment source | Admitting: *Deleted

## 2014-10-14 DIAGNOSIS — Z79899 Other long term (current) drug therapy: Secondary | ICD-10-CM

## 2014-10-14 DIAGNOSIS — E669 Obesity, unspecified: Secondary | ICD-10-CM

## 2014-10-14 DIAGNOSIS — E785 Hyperlipidemia, unspecified: Secondary | ICD-10-CM

## 2014-10-14 NOTE — Telephone Encounter (Signed)
Labs order and letter mailed with lab slip 3-4 month recheck.

## 2014-10-14 NOTE — Telephone Encounter (Signed)
-----   Message from Tobin Chad, RN sent at 07/26/2014 10:36 AM EST ----- MAIL,LETTER, LABSLIP --CMP ,LIPID ORDER DONE IN 07/2014

## 2014-10-28 ENCOUNTER — Ambulatory Visit (INDEPENDENT_AMBULATORY_CARE_PROVIDER_SITE_OTHER): Payer: 59 | Admitting: Podiatry

## 2014-10-28 ENCOUNTER — Encounter: Payer: Self-pay | Admitting: Podiatry

## 2014-10-28 VITALS — BP 98/57 | HR 77 | Resp 16

## 2014-10-28 DIAGNOSIS — Q828 Other specified congenital malformations of skin: Secondary | ICD-10-CM

## 2014-10-28 DIAGNOSIS — M79673 Pain in unspecified foot: Secondary | ICD-10-CM | POA: Diagnosis not present

## 2014-10-28 DIAGNOSIS — L03032 Cellulitis of left toe: Secondary | ICD-10-CM | POA: Diagnosis not present

## 2014-10-28 DIAGNOSIS — B351 Tinea unguium: Secondary | ICD-10-CM

## 2014-10-28 DIAGNOSIS — L02612 Cutaneous abscess of left foot: Secondary | ICD-10-CM

## 2014-10-28 DIAGNOSIS — L97521 Non-pressure chronic ulcer of other part of left foot limited to breakdown of skin: Secondary | ICD-10-CM | POA: Diagnosis not present

## 2014-10-28 MED ORDER — AMOXICILLIN-POT CLAVULANATE 875-125 MG PO TABS: 1.0000 | ORAL_TABLET | Freq: Two times a day (BID) | ORAL | Status: DC

## 2014-10-28 NOTE — Progress Notes (Signed)
He presents today with chief complaint of a painful fifth digit of the left foot. He would also like to have his nails debrided his calluses debrided if at all possible. He states that he is recently visited his vascular surgeon who stated that he was unable to stand his left leg secondary to possible prolapse which would result in amputation of his left lower extremity. He states that his fifth toe left foot started hurting approximately 2-3 weeks ago he states that he reluctantly asks his wife for help to assess his feet. He states the toe has been so tender and can hardly touch it with the bed linen. He denies fever chills nausea vomiting muscle aches and pains.  Objective: Pulses are nonpalpable left lower extremity barely palpable right lower extremity nails are thick yellow dystrophic onychomycotic painful palpation he has multiple reactive hyperkeratosis bilateral foot. Fifth digit does demonstrate erythema with a superficial ulceration to the lateral border which is plantar facing because of the adductovarus rotation. This is resulted in this ulceration and the ensuing infection. I do believe there is infection here but I am also concerned of ischemia. The area was marked today and I instructed him to follow-up with the emergency department should his cellulitis extending past the metatarsophalangeal joint.  Assessment: Pain in limb secondary to peripheral vascular disease cellulitis ischemia of the fifth digit left foot. Limb secondary to onychomycosis bilateral.  Plan: Debridement of all reactive hyperkeratosis and toenails bilateral Y dressing was placed to the left lower extremity a prescription for Augmentin 875 mg was prescribed twice daily. I'm requesting the follow-up with Dr. Vaughan Sine for future follow-up secondary to severe peripheral vascular disease which may require a amputation in the near future.

## 2014-11-03 ENCOUNTER — Encounter: Payer: Self-pay | Admitting: Podiatry

## 2014-11-03 ENCOUNTER — Ambulatory Visit (INDEPENDENT_AMBULATORY_CARE_PROVIDER_SITE_OTHER): Payer: 59 | Admitting: Podiatry

## 2014-11-03 VITALS — BP 128/54 | HR 78 | Resp 18

## 2014-11-03 DIAGNOSIS — L89891 Pressure ulcer of other site, stage 1: Secondary | ICD-10-CM | POA: Diagnosis not present

## 2014-11-03 DIAGNOSIS — L97521 Non-pressure chronic ulcer of other part of left foot limited to breakdown of skin: Secondary | ICD-10-CM

## 2014-11-03 NOTE — Patient Instructions (Signed)
Continue daily dressing changes as discussed Monitor for any signs/symptoms of infection. Call the office immediately if any occur or go directly to the emergency room. Call with any questions/concerns.  

## 2014-11-04 ENCOUNTER — Ambulatory Visit: Payer: 59

## 2014-11-04 ENCOUNTER — Encounter: Payer: Self-pay | Admitting: Vascular Surgery

## 2014-11-04 ENCOUNTER — Encounter: Payer: Self-pay | Admitting: Podiatry

## 2014-11-04 NOTE — Progress Notes (Signed)
Patient ID: William Pacheco, male   DOB: 02-27-1951, 64 y.o.   MRN: [id number]  Subjective: 64 year old male presents the office they follow valuation left fifth toe ulceration. He states that he recently saw Dr. Al Corpus and he is prescribed Augmentin, for which she continues to take. They've been continuing daily dressing changes of a antibiotic ointment and a bandage. He does state that the wound has improved significantly over the last week in regards to the redness. He denies any drainage or purulence. He continues wearing open toed shoe help take pressure off the toe. No other complaints at this time. Denies any systemic complaints as fevers, chills, nausea, vomiting.  Objective: AAO 3, NAD DP/PT pulses nonpalpable bilaterally Protective sensation decreased with Simms Weinstein monofilament On the lateral aspect the fifth digit there is a superficial ulceration measuring about 1 x 0.5 cm with a fibro-getting a wound base is superficial. There is no probing, undermining, tunneling. There is a small amount of erythema tracking from the lesion without any ascending cellulitis. There is no areas of fluctuance or crepitus. No malodor. Underlying adductovarus deformity. No other open lesions or pre-ulcer lesions identified bilaterally No areas of tenderness bilateral lower extremities. No pain with calf compression, swelling, warmth, erythema.  Assessment: 64 year old male left fifth toe ulceration  Plan: -Treatment options discussed included alternatives, risks, complications. -Recommended continue daily dressing changes as discussed. -Finish course of antibiotics -Continue open toed shoe to help take pressure off the toe -Follow-up with vascular surgery on Friday as scheduled -Monitor closely for any clinical signs or symptoms of worsening infection and directed to call the office stimulation any occur go to the ER. -Follow-up with me in 10 days or sooner if any problems are to arise. In the  meantime call the office with any questions, concerns, change in symptoms.

## 2014-11-05 ENCOUNTER — Ambulatory Visit (INDEPENDENT_AMBULATORY_CARE_PROVIDER_SITE_OTHER): Payer: 59 | Admitting: Vascular Surgery

## 2014-11-05 ENCOUNTER — Encounter: Payer: Self-pay | Admitting: Vascular Surgery

## 2014-11-05 VITALS — BP 142/65 | HR 69 | Ht 70.5 in | Wt 251.3 lb

## 2014-11-05 DIAGNOSIS — I70219 Atherosclerosis of native arteries of extremities with intermittent claudication, unspecified extremity: Secondary | ICD-10-CM

## 2014-11-05 DIAGNOSIS — I739 Peripheral vascular disease, unspecified: Secondary | ICD-10-CM | POA: Diagnosis not present

## 2014-11-05 NOTE — Progress Notes (Signed)
Established Intermittent Claudication  History of Present Illness  William Pacheco is a 64 y.o. (1950/11/25) male who presents with chief complaint: continued short distance intermittent claudication.  Previous procedures include:  1. Left popliteal fossa exploration, Left popliteal EA and patch angioplasty, aborted femoropopliteal bypass 2. PTA+S L SFA (08/14/11) 3. L EIA and CFA EA w/ DPA (9/14/9) by Dr. Madilyn Fireman 4. R CFA to BK bypass with goretex (2/6/7) by Dr Madilyn Fireman 5. R CFA EA c/ DPA(3/17/8) by Dr. Madilyn Fireman 6. PTA+S L SFA (7/25/8) by Dr. Madilyn Fireman 7. L SFA Directional atherectomy (4/13/7) by Dr. Madilyn Fireman 8. L SFA Directional atherectomy (9/14/7) by Dr. Madilyn Fireman  The patient's symptoms have not progressed.  The patient's prior heel lacerations healed.  He currently has a L 5th toe shallow ulcer.  Per the patient, this appears to be healing slowly.  The patient's treatment regimen currently included: maximal medical management.  The patient's PMH, PSH, SH, FamHx, Med, and Allergies are unchanged from 10/08/14.  On ROS today: L 5th toe shallow ulcer, no fever or chills  Physical Examination  Filed Vitals:   11/05/14 1020  BP: 142/65  Pulse: 69  Height: 5' 10.5" (1.791 m)  Weight: 251 lb 4.8 oz (113.989 kg)  SpO2: 99%   Body mass index is 35.54 kg/(m^2).  General: A&O x 3, WD, obese  Pulmonary: Sym exp, good air movt, CTAB, no rales, rhonchi, & wheezing  Cardiac: RRR, Nl S1, S2, no Murmurs, rubs or gallops  Vascular: Vessel Right Left  Radial Palpable Palpable  Brachial Palpable Palpable  Carotid Palpable, without bruit Palpable, without bruit  Aorta Not palpable N/A  Femoral Palpable Palpable  Popliteal Not palpable Not palpable  PT Faintly Palpable Not Palpable  DP Palpable Not Palpable   Gastrointestinal: soft, NTND, -G/R, - HSM, - masses, - CVAT B  Musculoskeletal: M/S 5/5 throughout , Extremities without ischemic changes except  L 5th toe: shallow clean  ulcer  Neurologic: Pain and light touch intact in extremities , Motor exam as listed above  Non-Invasive Vascular Imaging ABI (Date: 11/05/2014)  R: 0.53 (0.68), DP: mono, PT: mono, TBI: 0.40  L: 0.26 (0.37), DP: mono, PT: mono, TBI: flatline   Medical Decision Making  William Pacheco is a 64 y.o. male who presents with:  bilateral leg intermittent claudication , L 5th toe ulcer  At this point, pt continues to have short distal intermittent claudication.    The left heel has healed on its own.  I suspect the left 5th toe will also heal on its own as it demonstrates a shallow ulcer with clean granulation tissue.  The patient would need a L CFA to AT bypass with a lateral approach.  This patient's right GSV was harvested already.  His L GSV supposed was harvested for his CABG.  So a bypass conduit would have been constructed from PTFE + arm vein vs. Cryovein.  Given the potential limb threatening status of such a procedure once the bypass occludes, I recommended we hold off for now.  Based on the patient's vascular studies and examination, I have offered the patient: ABI, RLE arterial duplex q 3 months.  I discussed in depth with the patient the nature of atherosclerosis, and emphasized the importance of maximal medical management including strict control of blood pressure, blood glucose, and lipid levels, antiplatelet agents, obtaining regular exercise, and cessation of smoking.    The patient is aware that without maximal medical management the underlying atherosclerotic disease process will  progress, limiting the benefit of any interventions. The patient is currently on a statin: Crestor. The patient is currently on an anti-platelet: ASA.  Thank you for allowing [place] to participate in this patient's care.  [name], MD [facility] [place] Office: [phone] Pager: [phone]  11/05/2014, 1:00 PM

## 2014-11-08 ENCOUNTER — Encounter: Payer: Self-pay | Admitting: Cardiology

## 2014-11-08 ENCOUNTER — Ambulatory Visit (INDEPENDENT_AMBULATORY_CARE_PROVIDER_SITE_OTHER): Payer: 59 | Admitting: Cardiology

## 2014-11-08 VITALS — BP 120/60 | HR 84 | Ht 71.0 in | Wt 253.0 lb

## 2014-11-08 DIAGNOSIS — I10 Essential (primary) hypertension: Secondary | ICD-10-CM

## 2014-11-08 DIAGNOSIS — I255 Ischemic cardiomyopathy: Secondary | ICD-10-CM

## 2014-11-08 DIAGNOSIS — I25811 Atherosclerosis of native coronary artery of transplanted heart without angina pectoris: Secondary | ICD-10-CM

## 2014-11-08 DIAGNOSIS — I5042 Chronic combined systolic (congestive) and diastolic (congestive) heart failure: Secondary | ICD-10-CM

## 2014-11-08 DIAGNOSIS — Z79899 Other long term (current) drug therapy: Secondary | ICD-10-CM

## 2014-11-08 DIAGNOSIS — I70219 Atherosclerosis of native arteries of extremities with intermittent claudication, unspecified extremity: Secondary | ICD-10-CM

## 2014-11-08 DIAGNOSIS — I480 Paroxysmal atrial fibrillation: Secondary | ICD-10-CM

## 2014-11-08 DIAGNOSIS — E669 Obesity, unspecified: Secondary | ICD-10-CM

## 2014-11-08 DIAGNOSIS — I739 Peripheral vascular disease, unspecified: Secondary | ICD-10-CM | POA: Diagnosis not present

## 2014-11-08 DIAGNOSIS — Z7901 Long term (current) use of anticoagulants: Secondary | ICD-10-CM

## 2014-11-08 DIAGNOSIS — E785 Hyperlipidemia, unspecified: Secondary | ICD-10-CM

## 2014-11-08 NOTE — Progress Notes (Signed)
PCP: Dorrene German, MD  Clinic Note: Chief Complaint  Patient presents with  . Follow-up    pt states no chest pain, swelling, dizziness, SOB    HPI: William Pacheco is a 64 y.o. male with a PMH below who presents today for 3 month f/u for CAD, ischemic cardiomyopathy, PAF as well as PAD. He also has chronic diastolic heart failure. I last saw him in the end of January in close followup after having a Myoview performed that was read as "high risk. We spent a long time during that visit reviewing his coronary anatomy and the stress test results. It seemed like like findings of amenable to work on would be very low, and access for him for cardiac catheterization would be very difficult based on all the scar tissue in his groins. Radial access would not work either because of his grafts.  Doing pretty well from a Cardiac Standpoint.  Past Medical History  Diagnosis Date  . CAD (coronary artery disease), native coronary artery September 2008    Left main 50-60%, LAD 80% with D2 involved. RCA mid occlusion --> CABG  . 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 Ischemic cardiomyopathy September 2008    EF = 35-40%; THE 45-50% IN 2013 --> ECHO 03/03/13 - EF 40-45% LV: Moderate HK of basal-midlateral & inferior myocardium; c/w prior infarction in the distribution of the RCA or LCx. Mild-Mod MR with Calcified annulus. Mod TR  . History of MI (myocardial infarction) 2012    Noted by echocardiography, and nuclear stress testing. Known RCA occlusion prior to CABG  . PAF (paroxysmal atrial fibrillation) - initial presentation with RVR, and heart failure 2013    On a beta blocker, calcium channel blocker & Xarelto. Lexiscan Myoview 11/08/11 LV was enlarged with no evidence of ischemia. There was a moderate sized, moderate to severe in intensity fixed defect in the inferior wall suggestive of scar with no reversible ischemia. There was a basal septal akinesis  w/severe global hypokinesis. EF = 31% (Which is different from the [facility] EF)  . Chronic diastolic CHF (congestive heart failure), NYHA class 2 2009, 2011; 2013    Exacerbated by A. fib RVR, currently on Lasix.  . Peripheral arterial disease 2009, 2011; 2013    2009, 2011; 2013  . Renal artery stenosis, non-flow-limiting   . DM (diabetes mellitus), type 2 with neurological complications     Peripheral neuropathy  . Diabetic peripheral neuropathy associated with type 2 diabetes mellitus   . Hyperlipidemia LDL goal <70     On statin therapy  . Obesity (BMI 30.0-34.9)   . Hypertension associated with diabetes   . Erectile dysfunction associated with type 2 diabetes mellitus  2006  . Ischemic mitral regurgitation  1980s    Mild to moderate  . Thrombocytopenia      chronic  . Osteoarthritis of both knees 1990    Status post left knee a arthroscopy, currently Amyla Heffner right knee  . History of hepatitis B 1980s  . Adopted     Adopted [V68.89]    Prior Cardiac Evaluation and Past Surgical History: Past Surgical History  Procedure Laterality Date  . Femoral endarterectomy  03-15-2008    Left EIA & CFA endarterectomy by Dr. Madilyn Fireman  . Knee arthroscopy Left 1990    For osteoarthritis pain.  . Tonsillectomy    . Pleural effusion drainage Right   . Coronary artery bypass graft  03/2007  CABG x 6 LIMA-LAD, left radial to diagonal 1, SVG to OM1-OM2 sequential, SVG to PDA-PLA sequential.  . Transthoracic echocardiogram  November 2014    EF 40-45%. Moderate hypokinesis of the basal and mid lateral and inferior myocardium. Mild to moderate MR. Mild LA dilation.  . Transthoracic echocardiogram  07/26/2013    In setting of acute illness: EF 15-20%, atrial fibrillation; diffuse hypokinesis with inferolateral abnormalities consistent with ischemic heart disease; grade 3 diastolic dysfunction/restrictive physiology - elevated LVEDP/LAP..  . Transthoracic echocardiogram  09/09/2013    40%. Septal  hypokinesis. Moderate to severely dilated LV. Mild LVH and diffuse hypokinesis. Mild to moderate MR.  Marland Kitchen Nm myoview ltd  08/13/2013    Moderate LV dilation. Abnormal study with all high-risk features, unchanged from October 2014. Inferior defect likely representing nontransmural scar. Suggesting nonischemic cardiopathy superimposed on known ischemic cardiomyopathy.; Severe global hypokinesis with EF of roughly 31%.  . Vascular surgery Bilateral 2006    pt reports about 4 vascular surgeries starting in 2006 and last was in 2013.  Marland Kitchen Patch angioplasty Left 09/16/2013    Procedure: PATCH ANGIOPLASTY OF LEFT BELOW THE KNEE POPLITEAL ARTERY;  Surgeon: Conrad Hill 'n Dale, MD;  Location: [place];  Service: Vascular;  Laterality: Left;  . Intraoperative arteriogram Left 09/16/2013    Procedure: INTRA OPERATIVE ARTERIOGRAM;  Surgeon: Conrad Stamps, MD;  Location: [place];  Service: Vascular;  Laterality: Left;  . Groin dissection Left 09/16/2013    Procedure: GROIN EXPLORATION;  Surgeon: Conrad San Miguel, MD;  Location: [place];  Service: Vascular;  Laterality: Left;  . Endarterectomy popliteal Left 09/16/2013    Procedure: ENDARTERECTOMYOF LEFT POPLITEAL ARTERY;  Surgeon: Conrad Oscoda, MD;  Location: [place];  Service: Vascular;  Laterality: Left;  . Abdominal aortagram N/A 08/30/2011    Procedure: ABDOMINAL Maxcine Ham;  Surgeon: Conrad Fairbury, MD;  Location: [facility] CATH LAB;  Service: Cardiovascular;  Laterality: N/A;  . Percutaneous stent intervention Left 08/30/2011    Procedure: PERCUTANEOUS STENT INTERVENTION;  Surgeon: Conrad Vidalia, MD;  Location: [facility] CATH LAB;  Service: Cardiovascular;  Laterality: Left;  . Lower extremity angiogram Left 09/07/2013    Procedure: LOWER EXTREMITY ANGIOGRAM;  Surgeon: Conrad Lackawanna, MD;  Location: [facility] CATH LAB;  Service: Cardiovascular;  Laterality: Left;  . Nm myoview ltd  07/22/2014    EF ~35%. High-Risk study do to moderate size, medial intensity partially reversible defect consistent with prior  infave High-Risk do to reduced LV function; inferior akinesia and global hypokinesis     Interval History: overall, he really is doing okay from a cardiac standpoint. His most limiting feature is his claudication. He tries to "soldier through it with walking "he walk about 40 yards without having to stop. If he goes lower he can walk about twice as long. He is not noticing any significant CHF symptoms of PND or orthopnea or dyspnea no exertion they can do. No anginal symptoms at rest or exertion.  No signs of recurrence of A. Fib such as palpitations, lightheadedness, dizziness, weakness or syncope/near syncope. No TIA/amaurosis fugax symptoms. With him being on Xarelto, he denies melena, hematochezia, hematuria, or epstaxis. Lifestyle limiting claudication.  ROS: A comprehensive was performed. Review of Systems  Constitutional: Positive for malaise/fatigue (Decreased exercise tolerance).  HENT: Positive for nosebleeds (He has had 3 total nosebleeds in the last couple months. They just continued to bleed and make it very difficult to stop.).   Respiratory: Positive for cough and shortness of breath (If he  exerts himself he does get short of breath). Negative for sputum production.   Cardiovascular: Positive for claudication.  Genitourinary: Negative for frequency.  Musculoskeletal: Positive for back pain and joint pain. Negative for falls.  Neurological: Positive for dizziness (Occasional positional).  Psychiatric/Behavioral:       He actually has been a relatively good mood of late.  All other systems reviewed and are negative.   Current Outpatient Prescriptions on File Prior to Visit  Medication Sig Dispense Refill  . acetaminophen (TYLENOL) 500 MG tablet Take 500 mg by mouth every 6 (six) hours as needed.    Marland Kitchen amoxicillin-clavulanate (AUGMENTIN) 875-125 MG per tablet Take 1 tablet by mouth 2 (two) times daily. 20 tablet 1  . aspirin 81 MG tablet Take 81 mg by mouth daily.    . Calcium  Carb-Cholecalciferol (CALCIUM 1000 + D PO) Take 2 tablets by mouth daily.    . calcium carbonate (OS-CAL) 600 MG TABS tablet Take 600 mg by mouth 2 (two) times daily with a meal. Take two tablets daily    . carvedilol (COREG) 25 MG tablet Take 37.5 mg by mouth 2 (two) times daily with a meal. (1 and 1/2 tablets)    . Cyanocobalamin (VITAMIN B-12) 2000 MCG TBCR Take 3 tablets by mouth daily.    Marland Kitchen doxycycline (VIBRA-TABS) 100 MG tablet     . furosemide (LASIX) 40 MG tablet Take 1 tablet (40 mg total) by mouth 2 (two) times daily. 60 tablet 11  . Glucosamine-Chondroit-Vit C-Mn (GLUCOSAMINE CHONDR 1500 COMPLX PO) Take 1,500 mg by mouth daily.    . Insulin Aspart (NOVOLOG Asher) Inject 10 Units into the skin 2 (two) times daily.    . Insulin Glargine (LANTUS SOLOSTAR) 100 UNIT/ML Solostar Pen Inject 75 Units into the skin daily. 30 mL 2  . INVOKANA 300 MG TABS tablet Take 300 mg by mouth daily before breakfast. 30 tablet 3  . KLOR-CON M10 10 MEQ tablet     . losartan (COZAAR) 100 MG tablet Take 100 mg by mouth daily.    . Magnesium Oxide 500 MG CAPS Take 1,000 mg by mouth.    . metFORMIN (GLUCOPHAGE XR) 500 MG 24 hr tablet TAKE 3 TABLETS DAILY 90 tablet 3  . mupirocin ointment (BACTROBAN) 2 %     . NOVOLOG FLEXPEN 100 UNIT/ML FlexPen     . omega-3 acid ethyl esters (LOVAZA) 1 G capsule Take 1 g by mouth daily.    . pregabalin (LYRICA) 100 MG capsule Take 300 mg by mouth 3 (three) times daily.     . ramipril (ALTACE) 10 MG capsule     . Rivaroxaban (XARELTO) 20 MG TABS tablet Take 1 tablet (20 mg total) by mouth daily with supper. 30 tablet 0  . traMADol (ULTRAM) 50 MG tablet Take 1 tablet (50 mg total) by mouth every 6 (six) hours as needed. 50 tablet 0  . vitamin C (ASCORBIC ACID) 500 MG tablet Take 1,500 mg by mouth daily.    . Vitamin D, Cholecalciferol, 1000 UNITS TABS Take 3,000 Units by mouth daily.    . vitamin E 400 UNIT capsule Take 1,200 Units by mouth daily.    . [DISCONTINUED] amLODipine  (NORVASC) 10 MG tablet Take 10 mg by mouth daily.    . [DISCONTINUED] pioglitazone-metformin (ACTOPLUS MET) 15-850 MG per tablet Take 1 tablet by mouth 2 (two) times daily with a meal.    . [DISCONTINUED] simvastatin (ZOCOR) 40 MG tablet Take 40 mg by mouth every  evening.     No current facility-administered medications on file prior to visit.   No Known Allergies   History  Substance Use Topics  . Smoking status: Former Smoker -- 0.50 packs/day    Types: Cigarettes    Start date: 07/02/1973    Quit date: 07/02/2005  . Smokeless tobacco: Never Used  . Alcohol Use: No   Family History  Problem Relation Age of Onset  . Adopted: Yes  . Cancer Mother     Wt Readings from Last 3 Encounters:  11/08/14 114.76 kg (253 lb)  11/05/14 113.989 kg (251 lb 4.8 oz)  10/08/14 113.853 kg (251 lb)    PHYSICAL EXAM BP 120/60 mmHg  Pulse 84  Ht $R'5\' 11"'yb$  (1.803 m)  Wt 114.76 kg (253 lb)  BMI 35.30 kg/m2 General appearance: alert, cooperative, appears stated age, no distress and mild-moderately obese HEENT: [place]/AT, EOMI, MMM, anicteric sclera Neck: no adenopathy, no carotid bruit, no JVD and supple, symmetrical, trachea midline Lungs: clear to auscultation bilaterally, normal percussion bilaterally and Nonlabored, good air movement Heart: RRR, normal S1 and S2. Possible soft S4. Early peaking 1/6 SEM at the base radiating to carotid. Also 1/6 blowing HSM at left lower sternal border Abdomen: soft, non-tender; bowel sounds normal; no masses, no organomegaly and Mildly obese Extremities: No clubbing or cyanosis. The left lower leg as varying stages of healing at the op site. Pulses: Still diminished but palpable bilaterally. Neurologic: Grossly normal; CN II through XII grossly intact    Adult ECG Report - not done  Recent Labs:   Lab Results  Component Value Date   CHOL 186 07/22/2014   HDL 41 07/22/2014   LDLCALC 120* 07/22/2014   TRIG 127 07/22/2014   CHOLHDL 4.5 07/22/2014     ASSESSMENT / PLAN: Problem List Items Addressed This Visit    Anticoagulation adequate, on Xarelto (Chronic)   Atherosclerosis of native arteries of extremity with intermittent claudication - Primary (Chronic)    Back commended and was attempting to walk. Could consider Pletal but this was not done by his vascular surgeon. For now I think just continued attempts at walking is indicated.      Relevant Orders   Lipid panel   Comprehensive metabolic panel   CAD- CABG x 6 9/08- Myoview 07/1024; Moderate Inf Infarct with very mild Peri-infarct ischemia (Chronic)    Overall he states that he is relatively stable with no active anginal or heart failure symptoms.  He is on Supra-Max dose of carvedilol along with losartan at max dose. He was only taking 20 of Lipitor.  Would the absence of active anginal symptoms, I'm reluctant to pursue any invasive evaluation. He would not be an easy person to attempt cardiac catheterization on. Thankfully he is doing relatively well. I would show me consider Ranexa if he did have anginal symptoms. However, this would only serve to increase his problems of financial concern.      Relevant Orders   Lipid panel   Comprehensive metabolic panel   Chronic combined systolic and diastolic congestive heart failure, NYHA class 2 (Chronic)    Basically see above. I would say is at most class II symptoms partly because his claudication is more limiting than his heart.  On stable regimen as previously discussed.      Essential hypertension (Chronic)    Well-controlled on current meds.      Relevant Orders   Lipid panel   Comprehensive metabolic panel   Hyperlipidemia with target LDL less  than 70 (Chronic)    Increased back to 40 mg atorvastatin.  Reorder followup labs for July      Relevant Orders   Lipid panel   Comprehensive metabolic panel   Ischemic cardiomyopathy; EF roughly 40% (Chronic)    His EF was made based on his volume status. Currently I  would say is probably in the higher range that he would usually have. He is on a standing dose of Lasix with sliding scale written instructions. No real symptoms that would suggest active heart failure for now. On high-dose carvedilol and ARB. He is not on spironolactone Simply because of polypharmacy.  Plan is to recheck an echo perhaps after his next visit to reassess his EF and valve function.       Relevant Orders   Lipid panel   Comprehensive metabolic panel   Obesity (BMI 30-39.9) (Chronic)    Biggest issue here is his inability to really exercise. We discussed dietary modifications. His wife is with him today. Hopefully that will help.      Relevant Orders   Lipid panel   Comprehensive metabolic panel   PAD - R Fem-Pop, L SFA stent; recent left popliteal endarterectomy with patch angioplasty (Chronic)   Relevant Orders   Lipid panel   Comprehensive metabolic panel   PAF (paroxysmal atrial fibrillation) (Chronic)   Relevant Orders   Lipid panel   Comprehensive metabolic panel    Other Visit Diagnoses    Polypharmacy        Relevant Orders    Lipid panel    Comprehensive metabolic panel       Orders Placed This Encounter  Procedures  . Lipid panel    Standing Status: Future     Number of Occurrences:      Standing Expiration Date: 11/08/2015    Order Specific Question:  Has the patient fasted?    Answer:  Yes  . Comprehensive metabolic panel    Standing Status: Future     Number of Occurrences:      Standing Expiration Date: 11/08/2015    Order Specific Question:  Has the patient fasted?    Answer:  Yes   No orders of the defined types were placed in this encounter.     Patient Instructions:  CONTINUE WITH INCREASE 40 MG ATORVASTATIN( LIPITOR)  RECHECK LABS IN July 2016- WILL SEND YOU A LETTER AND LAB SLIP IN MAIL.  KEEP WALKING ,EVEN IF YOU HAVE TO STOP AND REST THEN CONTINUE.  Your physician wants you to follow-up in 6 months Dr Ellyn Hack.  Leonie Man, M.D., M.S. Interventional Cardiologist   Pager # [phone]

## 2014-11-08 NOTE — Patient Instructions (Signed)
CONTINUE WITH INCREASE 40 MG ATORVASTATIN( LIPITOR)  RECHECK LABS IN July 2016- WILL SEND YOU A LETTER AND LAB SLIP IN MAIL.  KEEP WALKING ,EVEN IF YOU HAVE TO STOP AND REST THEN CONTINUE.  Your physician wants you to follow-up in 6 months Dr Herbie Baltimore. 30 MIN APPOINTMENT.  You will receive a reminder letter in the mail two months in advance. If you don't receive a letter, please call our office to schedule the follow-up appointment.

## 2014-11-09 ENCOUNTER — Telehealth: Payer: Self-pay | Admitting: Cardiology

## 2014-11-09 DIAGNOSIS — E785 Hyperlipidemia, unspecified: Secondary | ICD-10-CM

## 2014-11-09 MED ORDER — ATORVASTATIN CALCIUM 40 MG PO TABS: 40.0000 mg | ORAL_TABLET | Freq: Every day | ORAL | Status: DC

## 2014-11-09 NOTE — Telephone Encounter (Signed)
Pt called in stating that his prescription for Atorvastatin needs to be increased to 40 mg instead of 20mg  . Please call into the [facility] on [address]  Thanks

## 2014-11-09 NOTE — Telephone Encounter (Signed)
Patient aware refill has been sent to the pharmacy electronically.  

## 2014-11-11 NOTE — Assessment & Plan Note (Addendum)
Increased back to 40 mg atorvastatin.  Reorder followup labs for July

## 2014-11-11 NOTE — Assessment & Plan Note (Signed)
Back commended and was attempting to walk. Could consider Pletal but this was not done by his vascular surgeon. For now I think just continued attempts at walking is indicated.

## 2014-11-11 NOTE — Assessment & Plan Note (Signed)
Well-controlled on current meds 

## 2014-11-11 NOTE — Assessment & Plan Note (Signed)
Basically see above. I would say is at most class II symptoms partly because his claudication is more limiting than his heart.  On stable regimen as previously discussed.

## 2014-11-11 NOTE — Assessment & Plan Note (Signed)
His EF was made based on his volume status. Currently I would say is probably in the higher range that he would usually have. He is on a standing dose of Lasix with sliding scale written instructions. No real symptoms that would suggest active heart failure for now. On high-dose carvedilol and ARB. He is not on spironolactone Simply because of polypharmacy.  Plan is to recheck an echo perhaps after his next visit to reassess his EF and valve function.

## 2014-11-11 NOTE — Assessment & Plan Note (Signed)
Overall he states that he is relatively stable with no active anginal or heart failure symptoms.  He is on Supra-Max dose of carvedilol along with losartan at max dose. He was only taking 20 of Lipitor.  Would the absence of active anginal symptoms, I'm reluctant to pursue any invasive evaluation. He would not be an easy person to attempt cardiac catheterization on. Thankfully he is doing relatively well. I would show me consider Ranexa if he did have anginal symptoms. However, this would only serve to increase his problems of financial concern.

## 2014-11-11 NOTE — Assessment & Plan Note (Signed)
Biggest issue here is his inability to really exercise. We discussed dietary modifications. His wife is with him today. Hopefully that will help.

## 2014-11-15 ENCOUNTER — Encounter: Payer: Self-pay | Admitting: Podiatry

## 2014-11-15 ENCOUNTER — Ambulatory Visit (INDEPENDENT_AMBULATORY_CARE_PROVIDER_SITE_OTHER): Payer: 59 | Admitting: Podiatry

## 2014-11-15 VITALS — BP 108/48 | HR 60 | Resp 18

## 2014-11-15 DIAGNOSIS — L89891 Pressure ulcer of other site, stage 1: Secondary | ICD-10-CM

## 2014-11-15 DIAGNOSIS — L97521 Non-pressure chronic ulcer of other part of left foot limited to breakdown of skin: Secondary | ICD-10-CM

## 2014-11-15 NOTE — Patient Instructions (Signed)
Continue daily dressing changes with antibiotic ointment and band-aid Monitor for any signs/symptoms of infection. Call the office immediately if any occur or go directly to the emergency room. Call with any questions/concerns.

## 2014-11-16 ENCOUNTER — Encounter: Payer: Self-pay | Admitting: Podiatry

## 2014-11-16 NOTE — Progress Notes (Signed)
Patient ID: William Pacheco, male   DOB: [birth year], 64 y.o.   MRN: [id number]  Subjective: Mr. Quattrone presents the office they follow evaluation left fifth toe ulceration. He states the wound has "healed up nicely". He continues with a Band-Aid overlying the toe daily. He denies any surrounding redness, drainage, red streaking. He has finished his course of antibiotics.  Denies any systemic complaints such as fevers, chills, nausea, vomiting. Denies calf pain, chest pain, shortness of breath. No other complaints this time.  Objective: AAO 3, NAD DP/PT pulses decreased bilaterally Protective sensation decreased with Simms Weinstein monofilament On the lateral aspect the fifth digit there is a superficial ulceration measuring about 0.5 x 0.3 cm with a granular wound base and is superficial. There is no probing, undermining, tunneling.There is no surrounding erythema or ascending cellulitis. There are no areas of fluctuance or crepitus. No malodor. Underlying adductovarus deformity. No other open lesions or pre-ulcer lesions identified bilaterally No areas of tenderness bilateral lower extremities. No pain with calf compression, swelling, warmth, erythema.  Assessment: 64 year old male left fifth toe ulceration, healing  Plan: -Treatment options discussed included alternatives, risks, complications. -Recommended continue daily dressing changes with antibiotic ointment and a band-aid -Dispensed offloading pads for the digit.  -Already has been evaluated by vascular surgery. Continue follow-up with them as scheduled.  -Monitor closely for any clinical signs or symptoms of worsening infection and directed to call the office stimulation any occur go to the ER. -Follow-up with me in 3 weeks or sooner if any problems are to arise. In the meantime call the office with any questions, concerns, change in symptoms.

## 2014-12-06 ENCOUNTER — Encounter: Payer: Self-pay | Admitting: Podiatry

## 2014-12-06 ENCOUNTER — Ambulatory Visit (INDEPENDENT_AMBULATORY_CARE_PROVIDER_SITE_OTHER): Payer: 59 | Admitting: Podiatry

## 2014-12-06 VITALS — BP 100/52 | HR 64 | Temp 97.3°F | Resp 14

## 2014-12-06 DIAGNOSIS — L89891 Pressure ulcer of other site, stage 1: Secondary | ICD-10-CM

## 2014-12-06 DIAGNOSIS — L97521 Non-pressure chronic ulcer of other part of left foot limited to breakdown of skin: Secondary | ICD-10-CM

## 2014-12-06 NOTE — Progress Notes (Signed)
Patient ID: William Pacheco, male   DOB: 01-11-1951, 64 y.o.   MRN: [id number]  Subjective: William Pacheco presents the office they follow evaluation left fifth toe ulceration. He states the wound continues to heal. He does state that the toe is tender particularly with pressure when trying to wear shoes. He denies any redness or drainage from the area. He was applying antibiotic ointment up until roughly 3 years ago. He denies any systemic complaints as fevers, chills, nausea, vomiting.Denies calf pain, chest pain, shortness of breath. No other complaints this time.  Objective: AAO 3, NAD DP/PT pulses decreased bilaterally (currently seeing vascular surgery as well). Protective sensation decreased with Dorann Ou monofilament On the lateral aspect the fifth digit there is a superficial ulceration measuring about 0.4 x 0.2 cm after debridement. There is a granular wound base and is superficial. There is no probing, undermining, tunneling.There is no surrounding erythema or ascending cellulitis. There are no areas of fluctuance or crepitus. No malodor. Underlying adductovarus deformity. No other open lesions or pre-ulcer lesions identified bilaterally No areas of tenderness bilateral lower extremities. No pain with calf compression, swelling, warmth, erythema.  Assessment: 64 year old male left fifth toe ulceration, healing  Plan: -Treatment options discussed included alternatives, risks, complications. -Recommended continue daily dressing changes with antibiotic ointment and a band-aid -Already has been evaluated by vascular surgery. Continue follow-up with them as scheduled. His next appointment is not still August. However this wound does not continuing to heal will likely have a follow-up with vascular surgery earlier. -Monitor closely for any clinical signs or symptoms of worsening infection and directed to call the office stimulation any occur go to the ER. -Follow-up with me in 2 weeks  or sooner if any problems are to arise. In the meantime call the office with any questions, concerns, change in symptoms.

## 2014-12-06 NOTE — Patient Instructions (Signed)
Monitor for any signs/symptoms of infection. Call the office immediately if any occur or go directly to the emergency room. Call with any questions/concerns.  

## 2014-12-20 ENCOUNTER — Encounter: Payer: Self-pay | Admitting: Podiatry

## 2014-12-20 ENCOUNTER — Ambulatory Visit (INDEPENDENT_AMBULATORY_CARE_PROVIDER_SITE_OTHER): Payer: 59 | Admitting: Podiatry

## 2014-12-20 VITALS — BP 115/55 | HR 83 | Temp 98.9°F | Resp 14

## 2014-12-20 DIAGNOSIS — L97521 Non-pressure chronic ulcer of other part of left foot limited to breakdown of skin: Secondary | ICD-10-CM

## 2014-12-20 DIAGNOSIS — L89891 Pressure ulcer of other site, stage 1: Secondary | ICD-10-CM | POA: Diagnosis not present

## 2014-12-25 ENCOUNTER — Encounter: Payer: Self-pay | Admitting: Podiatry

## 2014-12-25 NOTE — Progress Notes (Signed)
Patient ID: William Pacheco, male   DOB: 10-13-1950, 64 y.o.   MRN: [id number]  Subjective: William Pacheco presents the office they follow evaluation left fifth toe ulceration. He believes the wound is healed at this time. He does state that the toe does continue to become tender particularly with pressure when trying to wear shoes. He denies any redness or drainage from the area. He was applying antibiotic ointment. He denies any systemic complaints as fevers, chills, nausea, vomiting.Denies calf pain, chest pain, shortness of breath. No other complaints this time.  Objective: AAO 3, NAD DP/PT pulses decreased bilaterally (currently seeing vascular surgery as well). Protective sensation decreased with Dorann Ou monofilament On the lateral aspect the fifth digit there is a superficial ulceration measuring about 0.2 x 0.1 cm after debridement. There is a granular wound base and is superficial. There is no probing, undermining, tunneling.There is no surrounding erythema or ascending cellulitis. There are no areas of fluctuance or crepitus. No malodor. Underlying adductovarus deformity. No other open lesions or pre-ulcer lesions identified bilaterally No areas of tenderness bilateral lower extremities. No pain with calf compression, swelling, warmth, erythema.  Assessment: 64 year old male left fifth toe ulceration, healing although slowely  Plan: -Treatment options discussed included alternatives, risks, complications. -Recommended continue daily dressing changes with antibiotic ointment and a bandage.  -Monitor closely for any clinical signs or symptoms of worsening infection and directed to call the office stimulation any occur go to the ER. -Dispensed offloading pads. -Follow-up with me in 4 weeks or sooner if any problems are to arise. I discussed with him that there is any increase in swelling, redness, pain or the wound increases in size prior to this to call the office immediately. In  the meantime call the office with any questions, concerns, change in symptoms.  Ovid Curd, DPM

## 2014-12-27 ENCOUNTER — Other Ambulatory Visit: Payer: Self-pay

## 2015-01-04 ENCOUNTER — Other Ambulatory Visit: Payer: Self-pay | Admitting: Endocrinology

## 2015-01-31 ENCOUNTER — Ambulatory Visit (INDEPENDENT_AMBULATORY_CARE_PROVIDER_SITE_OTHER): Payer: 59 | Admitting: Podiatry

## 2015-01-31 ENCOUNTER — Ambulatory Visit (INDEPENDENT_AMBULATORY_CARE_PROVIDER_SITE_OTHER): Payer: 59

## 2015-01-31 DIAGNOSIS — R52 Pain, unspecified: Secondary | ICD-10-CM | POA: Diagnosis not present

## 2015-01-31 DIAGNOSIS — M79674 Pain in right toe(s): Secondary | ICD-10-CM

## 2015-01-31 DIAGNOSIS — B351 Tinea unguium: Secondary | ICD-10-CM

## 2015-01-31 DIAGNOSIS — M79675 Pain in left toe(s): Secondary | ICD-10-CM

## 2015-01-31 DIAGNOSIS — L89891 Pressure ulcer of other site, stage 1: Secondary | ICD-10-CM | POA: Diagnosis not present

## 2015-01-31 DIAGNOSIS — L97521 Non-pressure chronic ulcer of other part of left foot limited to breakdown of skin: Secondary | ICD-10-CM

## 2015-01-31 MED ORDER — DOXYCYCLINE HYCLATE 100 MG PO TABS: 100.0000 mg | ORAL_TABLET | Freq: Two times a day (BID) | ORAL | Status: DC

## 2015-01-31 NOTE — Progress Notes (Signed)
  Subjective: 64 y.o. returns the office today for painful, elongated, thickened toenails which he is unable to trim himself and for follow-up evaluation of an ulceration on the left 5th digit. Denies any redness or drainage around the nails. He doesn't of the site of the wound on the left fifth toe does cause some throbbing pain at times. He was of the wound is healed callus overlying the area. He denies any drainage or purulence from the area. Denies any acute changes since last appointment and no new complaints today. Denies any systemic complaints such as fevers, chills, nausea, vomiting.   Objective: AAO 3, NAD DP/PT pulses decreased, CRT less than 3 seconds Protective sensation decreased with Simms Weinstein monofilament Nails hypertrophic, dystrophic, elongated, brittle, discolored 10. There is tenderness overlying the nails 1-5 bilaterally. There is no surrounding erythema or drainage along the nail sites. On the lateral aspect left fifth toes hyperkeratotic lesion over the site of ulceration. Upon debridement there is a superficial collection of purulence expressed. His underlying ulceration which is granular. There is no probing of bone although close. There is no tunneling or undermining. There is no swelling erythema, ascending cellulitis, fluctuance, crepitus, malodor. No other open lesions or pre-ulcerative lesions are identified. No other areas of tenderness bilateral lower extremities. No overlying edema, erythema, increased warmth. No pain with calf compression, swelling, warmth, erythema.  Assessment: Patient presents with symptomatic onychomycosis; ulceration left fifth toe  Plan: -X-rays were obtained and reviewed with the patient. There is questionable radiolucent changes on the left fifth toe along the phalanx concerning for early OM. -Treatment options including alternatives, risks, complications were discussed -Nails sharply debrided 10 without  complication/bleeding. -Hyperkeratotic lesions/wound left fifth toe shop and debrided to healthy, bleeding wound base. After debridement no further purulence was expressed. -Rx Doxycycline -Discussed daily foot inspection. If there are any changes, to call the office immediately.  -Follow-up in 2 weeks or sooner if any problems are to arise. In the meantime, encouraged to call the office with any questions, concerns, changes symptoms. *X-ray left foot next appointment.   Ovid Curd, DPM

## 2015-01-31 NOTE — Patient Instructions (Signed)
Start antibiotics Change dressing daily with antibiotic ointment and a bandage daily Monitor for any signs/symptoms of infection. Call the office immediately if any occur or go directly to the emergency room. Call with any questions/concerns.

## 2015-02-04 ENCOUNTER — Ambulatory Visit: Payer: 59 | Admitting: Podiatry

## 2015-02-05 ENCOUNTER — Other Ambulatory Visit: Payer: Self-pay | Admitting: Endocrinology

## 2015-02-08 ENCOUNTER — Other Ambulatory Visit: Payer: Self-pay | Admitting: Endocrinology

## 2015-02-10 ENCOUNTER — Other Ambulatory Visit: Payer: Self-pay | Admitting: *Deleted

## 2015-02-10 DIAGNOSIS — I739 Peripheral vascular disease, unspecified: Secondary | ICD-10-CM

## 2015-02-10 DIAGNOSIS — Z48812 Encounter for surgical aftercare following surgery on the circulatory system: Secondary | ICD-10-CM

## 2015-02-14 ENCOUNTER — Encounter: Payer: Self-pay | Admitting: Podiatry

## 2015-02-14 ENCOUNTER — Ambulatory Visit (INDEPENDENT_AMBULATORY_CARE_PROVIDER_SITE_OTHER): Payer: 59 | Admitting: Podiatry

## 2015-02-14 ENCOUNTER — Ambulatory Visit (INDEPENDENT_AMBULATORY_CARE_PROVIDER_SITE_OTHER): Payer: 59

## 2015-02-14 DIAGNOSIS — L97521 Non-pressure chronic ulcer of other part of left foot limited to breakdown of skin: Secondary | ICD-10-CM

## 2015-02-14 DIAGNOSIS — L97509 Non-pressure chronic ulcer of other part of unspecified foot with unspecified severity: Secondary | ICD-10-CM | POA: Insufficient documentation

## 2015-02-14 DIAGNOSIS — L89891 Pressure ulcer of other site, stage 1: Secondary | ICD-10-CM

## 2015-02-14 NOTE — Patient Instructions (Signed)
Monitor for any signs/symptoms of infection. Call the office immediately if any occur or go directly to the emergency room. Call with any questions/concerns. Continue daily dressing changes 

## 2015-02-14 NOTE — Progress Notes (Signed)
  Subjective: 64 year old male presents the office for follow up evaluation of left foot fifth toe ulceration. He states that he was the areas healed is a callus over the area. He continues to get some discomfort in the area however it has improved. He has continued on antibiotics. He denies any drainage or redness from the area. He continues toward open toed shoe to help take pressure off the fifth toe. No other complaints at this time. He denies any systemic complaints as fevers, chills, nausea, vomiting. Denies any calf pain, chest pain, shortness of breath.  Objective: AAO 3, NAD DP/PT pulses decreased, CRT less than 3 seconds Protective sensation decreased with Simms Weinstein monofilament On the lateral aspect left fifth toes hyperkeratotic lesion over the site of ulceration. Upon debridement there is a small superficial ulceration without any drainage/purulence. There is no probe to bone, undermining, tunneling. There is no swelling erythema, ascending cellulitis, fluctuance, crepitus, malodor, drainage. No other open lesions or pre-ulcerative lesions identified bilaterally. No pain with calf compression, swelling, warmth, erythema.  Assessment: Patient presents with symptomatic onychomycosis; ulceration left fifth toe  Plan: -X-rays were obtained and reviewed with the patient. X-ray appears to be grossly unchanged from last appointment. -Treatment options including alternatives, risks, complications were discussed -Hyperkeratotic lesions/wound left fifth toe sharply debrided without complications/bleeding.  -Finish Doxycycline. He does not need to get the refill. -Discussed daily foot inspection. If there are any changes, to call the office immediately.  -Follow-up in 2-3 weeks or sooner if any problems are to arise. In the meantime, encouraged to call the office with any questions, concerns, changes symptoms. *X-ray left foot next appointment.   Ovid Curd, DPM

## 2015-02-17 ENCOUNTER — Encounter: Payer: Self-pay | Admitting: Family

## 2015-02-18 ENCOUNTER — Ambulatory Visit: Payer: 59 | Admitting: Family

## 2015-02-18 ENCOUNTER — Other Ambulatory Visit (HOSPITAL_COMMUNITY): Payer: 59

## 2015-02-18 ENCOUNTER — Encounter (HOSPITAL_COMMUNITY): Payer: 59

## 2015-02-23 ENCOUNTER — Encounter: Payer: Self-pay | Admitting: Family

## 2015-02-24 ENCOUNTER — Ambulatory Visit (INDEPENDENT_AMBULATORY_CARE_PROVIDER_SITE_OTHER): Payer: 59 | Admitting: Family

## 2015-02-24 ENCOUNTER — Ambulatory Visit (HOSPITAL_COMMUNITY)
Admission: RE | Admit: 2015-02-24 | Discharge: 2015-02-24 | Disposition: A | Discharge status: Home / Self Care | Payer: 59 | Source: Ambulatory Visit | Attending: Family | Admitting: Family

## 2015-02-24 ENCOUNTER — Encounter: Payer: Self-pay | Admitting: Family

## 2015-02-24 ENCOUNTER — Ambulatory Visit (INDEPENDENT_AMBULATORY_CARE_PROVIDER_SITE_OTHER)
Admission: RE | Admit: 2015-02-24 | Discharge: 2015-02-24 | Disposition: A | Discharge status: Home / Self Care | Payer: 59 | Source: Ambulatory Visit | Attending: Vascular Surgery | Admitting: Vascular Surgery

## 2015-02-24 VITALS — BP 112/68 | HR 75 | Temp 97.3°F | Resp 16 | Ht 71.0 in | Wt 241.0 lb

## 2015-02-24 DIAGNOSIS — E1151 Type 2 diabetes mellitus with diabetic peripheral angiopathy without gangrene: Secondary | ICD-10-CM

## 2015-02-24 DIAGNOSIS — Z87891 Personal history of nicotine dependence: Secondary | ICD-10-CM | POA: Diagnosis not present

## 2015-02-24 DIAGNOSIS — I739 Peripheral vascular disease, unspecified: Secondary | ICD-10-CM

## 2015-02-24 DIAGNOSIS — Z48812 Encounter for surgical aftercare following surgery on the circulatory system: Secondary | ICD-10-CM | POA: Insufficient documentation

## 2015-02-24 DIAGNOSIS — Z9889 Other specified postprocedural states: Secondary | ICD-10-CM

## 2015-02-24 DIAGNOSIS — Z959 Presence of cardiac and vascular implant and graft, unspecified: Secondary | ICD-10-CM

## 2015-02-24 DIAGNOSIS — E1159 Type 2 diabetes mellitus with other circulatory complications: Secondary | ICD-10-CM

## 2015-02-24 DIAGNOSIS — I70219 Atherosclerosis of native arteries of extremities with intermittent claudication, unspecified extremity: Secondary | ICD-10-CM | POA: Diagnosis not present

## 2015-02-24 DIAGNOSIS — Z95828 Presence of other vascular implants and grafts: Secondary | ICD-10-CM

## 2015-02-24 NOTE — Progress Notes (Signed)
[facility] [place] HISTORY AND PHYSICAL -PAD  History of Present Illness William Pacheco is a 64 y.o. male patient of Dr. Bridgett Larsson who presents with continued short distance intermittent claudication.  Previous procedures include:  1. Left popliteal fossa exploration, Left popliteal EA and patch angioplasty, aborted femoropopliteal bypass 2. PTA+S L SFA (08/14/11) 3. L EIA and CFA EA w/ DPA (9/14/9) by Dr. Amedeo Plenty 4. R CFA to BK bypass with goretex (2/6/7) by Dr Amedeo Plenty 5. R CFA EA c/ DPA(3/17/8) by Dr. Amedeo Plenty 6. PTA+S L SFA (7/25/8) by Dr. Amedeo Plenty 7. L SFA Directional atherectomy (4/13/7) by Dr. Amedeo Plenty 8. L SFA Directional atherectomy (9/14/7) by Dr. Amedeo Plenty  The patient's symptoms have not progressed. The patient's prior heel lacerations healed. He currently has a L 5th toe shallow ulcer. Per the patient, this appears to be healing slowly. The patient's treatment regimen currently included: maximal medical management.  The patient was last seen by Dr. Bridgett Larsson on 11/05/14. At that time pt continued to have short distal intermittent claudication.  The left heel healed on its own. Dr. Bridgett Larsson suspects the left 5th toe will also heal on its own as it demonstrated a shallow ulcer with clean granulation tissue. The patient would need a L CFA to AT bypass with a lateral approach. This patient's right GSV was harvested already. His L GSV supposed was harvested for his CABG. So a bypass conduit would have been constructed from PTFE + arm vein vs. Cryovein. Given the potential limb threatening status of such a procedure once the bypass occludes, Dr. Bridgett Larsson recommended we hold off for now. Based on the patient's vascular studies and examination, Dr. Bridgett Larsson offered the patient: ABI, RLE arterial duplex q 3 months. Pt returns today for 3 months follow up.  Pt reports that he walks a total of about 2 hours daily with left calf pain after about 3 minutes of walking.  He sees a podiatrist on a  regular basis who is managing his left 5th toe healing ulcer..   The patient denies New Medical or Surgical History.  Pt Diabetic: Yes, states last A1C was worse than the previous 9.1, states recent home FBS are 140-170, admits to not eating correctly, he is seeing Dr. Dwyane Dee, endocrinologist, since March 2016. Pt states he has not seen Dr. Dwyane Dee "in a long time", states his PCP is helping him manage his DM. Pt smoker: former smoker, quit in 2006  Pt meds include: Statin :Yes Betablocker: Yes ASA: no Other anticoagulants/antiplatelets: taking Xaralto for history of atrial fib   Past Medical History  Diagnosis Date  . CAD (coronary artery disease), native coronary artery September 2008    Left main 50-60%, LAD 80% with D2 involved. RCA mid occlusion --> CABG  . 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 Ischemic cardiomyopathy September 2008    EF = 35-40%; THE 45-50% IN 2013 --> ECHO 03/03/13 - EF 40-45% LV: Moderate HK of basal-midlateral & inferior myocardium; c/w prior infarction in the distribution of the RCA or LCx. Mild-Mod MR with Calcified annulus. Mod TR  . History of MI (myocardial infarction) 2012    Noted by echocardiography, and nuclear stress testing. Known RCA occlusion prior to CABG  . PAF (paroxysmal atrial fibrillation) - initial presentation with RVR, and heart failure 2013    On a beta blocker, calcium channel blocker & Xarelto. Lexiscan Myoview 11/08/11 LV was enlarged with no evidence of ischemia.  There was a moderate sized, moderate to severe in intensity fixed defect in the inferior wall suggestive of scar with no reversible ischemia. There was a basal septal akinesis w/severe global hypokinesis. EF = 31% (Which is different from the [facility] EF)  . Chronic diastolic CHF (congestive heart failure), NYHA class 2 2009, 2011; 2013    Exacerbated by A. fib RVR, currently on Lasix.  . Peripheral arterial disease 2009, 2011; 2013    2009,  2011; 2013  . Renal artery stenosis, non-flow-limiting   . DM (diabetes mellitus), type 2 with neurological complications     Peripheral neuropathy  . Diabetic peripheral neuropathy associated with type 2 diabetes mellitus   . Hyperlipidemia LDL goal <70     On statin therapy  . Obesity (BMI 30.0-34.9)   . Hypertension associated with diabetes   . Erectile dysfunction associated with type 2 diabetes mellitus  2006  . Ischemic mitral regurgitation  1980s    Mild to moderate  . Thrombocytopenia      chronic  . Osteoarthritis of both knees 1990    Status post left knee a arthroscopy, currently [place] right knee  . History of hepatitis B 1980s  . Adopted     Adopted [V68.89]    Social History Social History  Substance Use Topics  . Smoking status: Former Smoker -- 0.50 packs/day    Types: Cigarettes    Start date: 07/02/1973    Quit date: 07/02/2005  . Smokeless tobacco: Never Used  . Alcohol Use: No    Family History Family History  Problem Relation Age of Onset  . Adopted: Yes  . Cancer Mother     Past Surgical History  Procedure Laterality Date  . Femoral endarterectomy  03-15-2008    Left EIA & CFA endarterectomy by Dr. Amedeo Plenty  . Knee arthroscopy Left 1990    For osteoarthritis pain.  . Tonsillectomy    . Pleural effusion drainage Right   . Coronary artery bypass graft  03/2007    CABG x 6 LIMA-LAD, left radial to diagonal 1, SVG to OM1-OM2 sequential, SVG to PDA-PLA sequential.  . Transthoracic echocardiogram  November 2014    EF 40-45%. Moderate hypokinesis of the basal and mid lateral and inferior myocardium. Mild to moderate MR. Mild LA dilation.  . Transthoracic echocardiogram  07/26/2013    In setting of acute illness: EF 15-20%, atrial fibrillation; diffuse hypokinesis with inferolateral abnormalities consistent with ischemic heart disease; grade 3 diastolic dysfunction/restrictive physiology - elevated LVEDP/LAP..  . Transthoracic echocardiogram  09/09/2013     40%. Septal hypokinesis. Moderate to severely dilated LV. Mild LVH and diffuse hypokinesis. Mild to moderate MR.  Marland Kitchen Nm myoview ltd  08/13/2013    Moderate LV dilation. Abnormal study with all high-risk features, unchanged from October 2014. Inferior defect likely representing nontransmural scar. Suggesting nonischemic cardiopathy superimposed on known ischemic cardiomyopathy.; Severe global hypokinesis with EF of roughly 31%.  . Vascular surgery Bilateral 2006    pt reports about 4 vascular surgeries starting in 2006 and last was in 2013.  Marland Kitchen Patch angioplasty Left 09/16/2013    Procedure: PATCH ANGIOPLASTY OF LEFT BELOW THE KNEE POPLITEAL ARTERY;  Surgeon: Conrad [place], MD;  Location: [place];  Service: Vascular;  Laterality: Left;  . Intraoperative arteriogram Left 09/16/2013    Procedure: INTRA OPERATIVE ARTERIOGRAM;  Surgeon: Conrad Lockwood, MD;  Location: [place];  Service: Vascular;  Laterality: Left;  . Groin dissection Left 09/16/2013    Procedure: GROIN EXPLORATION;  Surgeon: Conrad Loughman, MD;  Location: [place];  Service: Vascular;  Laterality: Left;  . Endarterectomy popliteal Left 09/16/2013    Procedure: ENDARTERECTOMYOF LEFT POPLITEAL ARTERY;  Surgeon: Conrad [place], MD;  Location: [place];  Service: Vascular;  Laterality: Left;  . Abdominal aortagram N/A 08/30/2011    Procedure: ABDOMINAL Maxcine Ham;  Surgeon: Conrad [place], MD;  Location: [facility] CATH LAB;  Service: Cardiovascular;  Laterality: N/A;  . Percutaneous stent intervention Left 08/30/2011    Procedure: PERCUTANEOUS STENT INTERVENTION;  Surgeon: Conrad [place], MD;  Location: [facility] CATH LAB;  Service: Cardiovascular;  Laterality: Left;  . Lower extremity angiogram Left 09/07/2013    Procedure: LOWER EXTREMITY ANGIOGRAM;  Surgeon: Conrad [place], MD;  Location: [facility] CATH LAB;  Service: Cardiovascular;  Laterality: Left;  . Nm myoview ltd  07/22/2014    EF ~35%. High-Risk study do to moderate size, medial intensity partially reversible defect consistent  with prior infave High-Risk do to reduced LV function; inferior akinesia and global hypokinesis     No Known Allergies  Current Outpatient Prescriptions  Medication Sig Dispense Refill  . acetaminophen (TYLENOL) 500 MG tablet Take 500 mg by mouth every 6 (six) hours as needed.    Marland Kitchen amoxicillin-clavulanate (AUGMENTIN) 875-125 MG per tablet Take 1 tablet by mouth 2 (two) times daily. 20 tablet 1  . aspirin 81 MG tablet Take 81 mg by mouth daily.    Marland Kitchen atorvastatin (LIPITOR) 40 MG tablet Take 1 tablet (40 mg total) by mouth daily. 30 tablet 6  . Calcium Carb-Cholecalciferol (CALCIUM 1000 + D PO) Take 2 tablets by mouth daily.    . calcium carbonate (OS-CAL) 600 MG TABS tablet Take 600 mg by mouth 2 (two) times daily with a meal. Take two tablets daily    . carvedilol (COREG) 25 MG tablet Take 37.5 mg by mouth 2 (two) times daily with a meal. (1 and 1/2 tablets)    . Cyanocobalamin (VITAMIN B-12) 2000 MCG TBCR Take 3 tablets by mouth daily.    Marland Kitchen doxycycline (VIBRA-TABS) 100 MG tablet     . doxycycline (VIBRA-TABS) 100 MG tablet Take 1 tablet (100 mg total) by mouth 2 (two) times daily. 20 tablet 0  . furosemide (LASIX) 40 MG tablet Take 1 tablet (40 mg total) by mouth 2 (two) times daily. 60 tablet 11  . Glucosamine-Chondroit-Vit C-Mn (GLUCOSAMINE CHONDR 1500 COMPLX PO) Take 1,500 mg by mouth daily.    . Insulin Aspart (NOVOLOG [place]) Inject 10 Units into the skin 2 (two) times daily.    . Insulin Glargine (LANTUS SOLOSTAR) 100 UNIT/ML Solostar Pen Inject 75 Units into the skin daily. 30 mL 2  . INVOKANA 300 MG TABS tablet TAKE 1 TABLET BY MOUTH DAILY BEFORE BREAKFAST 30 tablet 0  . KLOR-CON M10 10 MEQ tablet     . losartan (COZAAR) 100 MG tablet Take 100 mg by mouth daily.    . Magnesium Oxide 500 MG CAPS Take 1,000 mg by mouth.    . metFORMIN (GLUCOPHAGE-XR) 500 MG 24 hr tablet TAKE (3) TABLETS BY MOUTH DAILY 90 tablet 0  . mupirocin ointment (BACTROBAN) 2 %     . NOVOLOG FLEXPEN 100 UNIT/ML  FlexPen     . omega-3 acid ethyl esters (LOVAZA) 1 G capsule Take 1 g by mouth daily.    . pregabalin (LYRICA) 100 MG capsule Take 300 mg by mouth 3 (three) times daily.     . ramipril (ALTACE) 10 MG capsule     .  Rivaroxaban (XARELTO) 20 MG TABS tablet Take 1 tablet (20 mg total) by mouth daily with supper. 30 tablet 0  . traMADol (ULTRAM) 50 MG tablet Take 1 tablet (50 mg total) by mouth every 6 (six) hours as needed. 50 tablet 0  . vitamin C (ASCORBIC ACID) 500 MG tablet Take 1,500 mg by mouth daily.    . Vitamin D, Cholecalciferol, 1000 UNITS TABS Take 3,000 Units by mouth daily.    . vitamin E 400 UNIT capsule Take 1,200 Units by mouth daily.    . [DISCONTINUED] amLODipine (NORVASC) 10 MG tablet Take 10 mg by mouth daily.    . [DISCONTINUED] pioglitazone-metformin (ACTOPLUS MET) 15-850 MG per tablet Take 1 tablet by mouth 2 (two) times daily with a meal.    . [DISCONTINUED] simvastatin (ZOCOR) 40 MG tablet Take 40 mg by mouth every evening.     No current facility-administered medications for this visit.    ROS: See HPI for pertinent positives and negatives.   Physical Examination  Filed Vitals:   02/24/15 1023  BP: 112/68  Pulse: 75  Temp: 97.3 F (36.3 C)  TempSrc: Oral  Resp: 16  Height: $Remove'5\' 11"'JcUIUlT$  (1.803 m)  Weight: 241 lb (109.317 kg)  SpO2: 97%   Body mass index is 33.63 kg/(m^2).   General: A&O x 3, WDWN, obese male.  Pulmonary: Sym exp, good air movt, CTAB, no rales, rhonchi, or wheezing  Cardiac: Irregualr rate and rhythm, no detected murmur. Aorta is not palpable.  Vascular: Vessel Right Left  Radial notPalpable Not Palpable  Brachial Palpable Palpable  Carotid Palpable, without bruit Palpable, without bruit  Aorta Not palpable N/A  Femoral Not Palpable faintly Palpable  Popliteal Not palpable Not palpable  PT not Palpable Not Palpable  DP Not Palpable Not Palpable    Gastrointestinal: soft, NTND, -G/R, - HSM, - masses palpated, - CVAT B  Musculoskeletal: M/S 5/5 throughout , Extremities without ischemic changes , no gangrene in either foot. Left 5th toe ulcer is granulated, dry, no erythema, Fissures in left heel have healed.  Neurologic: Pain and light touch intact in extremities , Motor exam as listed above                  Non-Invasive Vascular Imaging: DATE: 02/24/2015 LOWER EXTREMITY ARTERIAL DUPLEX EVALUATION    INDICATION: Peripheral vascular disease     PREVIOUS INTERVENTION(S): Fight femoropopliteal bypass graft 2007; left distal SFA stenting 01/24/2007 7 08/30/2011; left distal popliteal angioplasty with inability to place left leg BPG 09/16/2013.    DUPLEX EXAM:     RIGHT  LEFT   Peak Systolic Velocity (cm/s) Ratio (if abnormal) Waveform  Peak Systolic Velocity (cm/s) Ratio (if abnormal) Waveform  82  M  Inflow Artery     43  M Proximal Anastomosis     42  M Proximal Graft     34  M Mid Graft     50  M  Distal Graft     32  M Distal Anastomosis     54  M Outflow Artery     0.57 Today's ABI / TBI 0.33  0.53 Previous ABI / TBI (10/08/2014  ) 0.26    Waveform:    M - Monophasic       B - Biphasic       T - Triphasic  If Ankle Brachial Index (ABI) or Toe Brachial Index (TBI) performed, please see complete report     ADDITIONAL FINDINGS:     IMPRESSION:  Patent right lower extremity bypass graft with low flow rates noted throughout. No evidence of stenosis.     Compared to the previous exam:  No significant change in ABIs when compared to the last exam on 10/08/2014.      ASSESSMENT: William Pacheco is a 64 y.o. male who is s/p left groin, above-the-knee and below-the-knee popliteal artery exploration, Left below-the-knee popliteal artery endarterectomy with bovine patch angioplasty, Open cannulation of left common femoral artery Left leg runoff on 09/16/2013.  He is also s/p R fem-pop BPG by Dr. Amedeo Plenty and L distal SFA  stenting (08/30/11). 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  Pt walks about 30 yards and left calf cramps, resolves with rest, this has not changed. A barrier to walking is right knee OA pain for which he receives and injection that does not seem to be a corticosteroid as he states it does not increase his blood sugar. His podiatrist is managing his left 5th toe ulcer which is healing well, well granulated. He has no other ulcers in his lower extremities. His uncontrolled DM is his primary atherosclerotic risk factor. He also is a former smoker and takes Xarelto for his atrial fib. It is unclear why pt has not followed up with Dr. Dwyane Dee, his endocrinologist; I advised pt to follow up with Dr. Dwyane Dee ASAP.    PLAN:  Continued graduated walking program and daily seated leg exercises as discussed.  Based on the patient's vascular studies and examination, pt will return to clinic in 3 months with ABI's and bilateral LE arterial Duplex. He knows to return sooner if needed.  I discussed in depth with the patient the nature of atherosclerosis, and emphasized the importance of maximal medical management including strict control of blood pressure, blood glucose, and lipid levels, obtaining regular exercise, and continued cessation of smoking.  The patient is aware that without maximal medical management the underlying atherosclerotic disease process will progress, limiting the benefit of any interventions.  The patient was given information about PAD including signs, symptoms, treatment, what symptoms should prompt the patient to seek immediate medical care, and risk reduction measures to take.  Clemon Chambers, RN, MSN, FNP-C [facility] Phone: [phone]  Clinic MD: Early  02/24/2015 10:26 AM

## 2015-02-24 NOTE — Patient Instructions (Signed)

## 2015-03-11 ENCOUNTER — Ambulatory Visit (INDEPENDENT_AMBULATORY_CARE_PROVIDER_SITE_OTHER): Payer: 59

## 2015-03-11 ENCOUNTER — Ambulatory Visit (INDEPENDENT_AMBULATORY_CARE_PROVIDER_SITE_OTHER): Payer: 59 | Admitting: Podiatry

## 2015-03-11 ENCOUNTER — Encounter: Payer: Self-pay | Admitting: Podiatry

## 2015-03-11 ENCOUNTER — Other Ambulatory Visit: Payer: Self-pay | Admitting: Endocrinology

## 2015-03-11 VITALS — BP 125/63 | HR 73 | Resp 16

## 2015-03-11 DIAGNOSIS — L97521 Non-pressure chronic ulcer of other part of left foot limited to breakdown of skin: Secondary | ICD-10-CM

## 2015-03-12 ENCOUNTER — Other Ambulatory Visit: Payer: Self-pay | Admitting: Endocrinology

## 2015-03-17 ENCOUNTER — Encounter: Payer: Self-pay | Admitting: Podiatry

## 2015-03-17 NOTE — Progress Notes (Signed)
  Subjective: 64 year old male presents the office for follow up evaluation of left foot fifth toe ulceration. He continues his stated the wound is doing 'alright'. He denies any drainage or any surrounding redness. He states that the areas callused over. He continues to an offloading pad to the area. No other complaints at this time. He denies any systemic complaints as fevers, chills, nausea, vomiting. Denies any calf pain, chest pain, shortness of breath.  Objective: AAO 3, NAD DP/PT pulses decreased, CRT less than 3 seconds Protective sensation decreased with Simms Weinstein monofilament On the lateral aspect left fifth toes hyperkeratotic lesion over the site of ulceration. Upon debridement there is a very small pinpoint superficial ulceration without any drainage/purulence. There is no probe to bone, undermining, tunneling. There is no surrounding erythema, ascending cellulitis, fluctuance, crepitus, malodor, drainage. No other open lesions or pre-ulcerative lesions identified bilaterally. No pain with calf compression, swelling, warmth, erythema.  Assessment: Patient presents with ulceration left fifth toe, which has improved compared to last appointment and decreasing in size.   Plan: -X-rays were obtained and reviewed with the patient. X-ray appears to be grossly unchanged from last appointment. -Treatment options including alternatives, risks, complications were discussed -Hyperkeratotic lesions/wound left fifth toe sharply debrided without complications/bleeding. The wound appears to be the smallest it has been since I started seeing him. Continue with daily dressing changes and offloading pads -Discussed daily foot inspection. If there are any changes, to call the office immediately.  -Follow-up as directed or sooner if any problems are to arise. In the meantime, encouraged to call the office with any questions, concerns, changes symptoms.  Ovid Curd, DPM

## 2015-04-01 ENCOUNTER — Other Ambulatory Visit: Payer: Self-pay | Admitting: Endocrinology

## 2015-04-04 ENCOUNTER — Telehealth: Payer: Self-pay | Admitting: Endocrinology

## 2015-04-04 NOTE — Telephone Encounter (Signed)
Patient needs to be seen before refills given

## 2015-04-04 NOTE — Telephone Encounter (Signed)
Patient need a prior Auth for Metformin [facility] [address]

## 2015-04-07 ENCOUNTER — Ambulatory Visit (INDEPENDENT_AMBULATORY_CARE_PROVIDER_SITE_OTHER): Payer: 59 | Admitting: Endocrinology

## 2015-04-07 ENCOUNTER — Other Ambulatory Visit: Payer: Self-pay | Admitting: *Deleted

## 2015-04-07 ENCOUNTER — Encounter: Payer: Self-pay | Admitting: Endocrinology

## 2015-04-07 VITALS — BP 145/82 | HR 64 | Temp 98.2°F | Resp 16 | Ht 71.0 in | Wt 242.2 lb

## 2015-04-07 DIAGNOSIS — I10 Essential (primary) hypertension: Secondary | ICD-10-CM

## 2015-04-07 DIAGNOSIS — IMO0002 Reserved for concepts with insufficient information to code with codable children: Secondary | ICD-10-CM

## 2015-04-07 DIAGNOSIS — I70209 Unspecified atherosclerosis of native arteries of extremities, unspecified extremity: Secondary | ICD-10-CM

## 2015-04-07 DIAGNOSIS — E1140 Type 2 diabetes mellitus with diabetic neuropathy, unspecified: Secondary | ICD-10-CM

## 2015-04-07 DIAGNOSIS — E1165 Type 2 diabetes mellitus with hyperglycemia: Secondary | ICD-10-CM | POA: Diagnosis not present

## 2015-04-07 DIAGNOSIS — E1142 Type 2 diabetes mellitus with diabetic polyneuropathy: Secondary | ICD-10-CM

## 2015-04-07 LAB — LIPID PANEL
CHOL/HDL RATIO: 4
Cholesterol: 148 mg/dL (ref 0–200)
HDL: 40.5 mg/dL (ref >39.00)
LDL CALC: 87 mg/dL (ref 0–99)
NONHDL: 107.14
TRIGLYCERIDES: 99 mg/dL (ref 0.0–149.0)
VLDL: 19.8 mg/dL (ref 0.0–40.0)

## 2015-04-07 LAB — BASIC METABOLIC PANEL
BUN: 16 mg/dL (ref 6–23)
CALCIUM: 9.5 mg/dL (ref 8.4–10.5)
CHLORIDE: 104 meq/L (ref 96–112)
CO2: 32 meq/L (ref 19–32)
CREATININE: 1.09 mg/dL (ref 0.40–1.50)
GFR: 87.99 mL/min (ref >60.00)
GLUCOSE: 205 mg/dL — AB (ref 70–99)
Potassium: 4.7 mEq/L (ref 3.5–5.1)
Sodium: 141 mEq/L (ref 135–145)

## 2015-04-07 LAB — POCT GLYCOSYLATED HEMOGLOBIN (HGB A1C): HEMOGLOBIN A1C: 10.5

## 2015-04-07 MED ORDER — CILOSTAZOL 50 MG PO TABS: 50.0000 mg | ORAL_TABLET | Freq: Two times a day (BID) | ORAL | Status: DC

## 2015-04-07 MED ORDER — METFORMIN HCL ER 500 MG PO TB24: ORAL_TABLET | ORAL | Status: DC

## 2015-04-07 MED ORDER — INSULIN REGULAR HUMAN (CONC) 500 UNIT/ML ~~LOC~~ SOPN: PEN_INJECTOR | SUBCUTANEOUS | Status: AC

## 2015-04-07 NOTE — Progress Notes (Signed)
Patient ID: William Pacheco, male   DOB: June 30, 1951, 64 y.o.   MRN: [id number]           Reason for Appointment: Follow-up for Type 2 Diabetes  Referring physician:  History of Present Illness:          Diagnosis: Type 2 diabetes mellitus, date of diagnosis: 1999       Previous history: He was virtually asymptomatic at the time of diagnosis even though his blood sugar at a free clinic was 550  Metformin was prescribed initially for his treatment but he does not know why this was stopped at some point He also thinks he had taken various other oral medications but no other injectable drugs before starting insulin in 2007 or so  Recent history:   INSULIN regimen is described as: Toujeo 70 units at bedtime, NovoLog 15 units daily-twice a day   His blood sugars have been persistently poorly controlled since at least 2012 with only occasional A1c readings below 9% He has not been seen in follow-up since his initial consultation in 3/16 when he was told to increase his Lantus and Novolog insulin doses as well as start metformin Also he was instructed on taking Lantus twice a day and then go on to taking Toujeo but he did not do so His Invokana was increased to 300 mg  Current blood sugar patterns and problems identified:  He has not brought his blood sugar monitor and not clear how often he is checking his readings, probably checking mostly in the morning  He has tried a sample of Toujeo insulin from his PCP in the last week that he does not know if his sugars are better  He does not like to take multiple injections and does not always take Novolog with his meals; he says he does not usually eat much at breakfast  He is not able to exercise because of claudication  Blood sugars are probably higher after his lunch meal but he does not do any readings after supper  He has lost a little weight since his last visit, has taken metformin until about a week ago when he ran out; he was however  taking only 1000 mg a day       Oral hypoglycemic drugs the patient is taking are: Invokana 300 mg daily      Side effects from medications have been: None    Compliance with the medical regimen: Fair Hypoglycemia: none   Glucose monitoring:  done one time a day         Glucometer: One Touch.      Blood Glucose readings by time of day and averages from recall  Mean values apply above for all meters except median for One Touch  PRE-MEAL Fasting Lunch Dinner Bedtime Overall  Glucose range: 170 215 ? ?   Mean/median:          Self-care: The diet that the patient has been following is: tries to limit carbs .     Meals: 2-3 meals per day. Breakfast is sometimes eggs, lunch is vegetable juice smoothie sometimes otherwise sandwich            Exercise: minimal        Dietician visit, most recent: 04/2014.               Weight history: 222-250  Wt Readings from Last 3 Encounters:  04/07/15 242 lb 3.2 oz (109.861 kg)  02/24/15 241 lb (109.317 kg)  11/08/14 253  lb (114.76 kg)    Glycemic control:   Lab Results  Component Value Date   HGBA1C 10.5 04/07/2015   HGBA1C 11.4* 08/30/2014   HGBA1C 10.0* 09/17/2013   Lab Results  Component Value Date   LDLCALC 120* 07/22/2014   CREATININE 1.09 07/22/2014         Medication List       This list is accurate as of: 04/07/15 10:35 AM.  Always use your most recent med list.               amoxicillin-clavulanate 875-125 MG tablet  Commonly known as:  AUGMENTIN  Take 1 tablet by mouth 2 (two) times daily.     aspirin 81 MG tablet  Take 81 mg by mouth daily.     atorvastatin 40 MG tablet  Commonly known as:  LIPITOR  Take 1 tablet (40 mg total) by mouth daily.     CALCIUM 1000 + D PO  Take 2 tablets by mouth daily.     calcium carbonate 600 MG Tabs tablet  Commonly known as:  OS-CAL  Take 600 mg by mouth 2 (two) times daily with a meal. Take two tablets daily     carvedilol 25 MG tablet  Commonly known as:  COREG    Take 37.5 mg by mouth 2 (two) times daily with a meal. (1 and 1/2 tablets)     cilostazol 50 MG tablet  Commonly known as:  PLETAL  Take 1 tablet (50 mg total) by mouth 2 (two) times daily.     furosemide 40 MG tablet  Commonly known as:  LASIX  Take 1 tablet (40 mg total) by mouth 2 (two) times daily.     GLUCOSAMINE CHONDR 1500 COMPLX PO  Take 1,500 mg by mouth daily.     TOUJEO SOLOSTAR 300 UNIT/ML Sopn  Generic drug:  Insulin Glargine  Inject 70 Units into the skin.     Insulin Glargine 100 UNIT/ML Solostar Pen  Commonly known as:  LANTUS SOLOSTAR  Inject 75 Units into the skin daily.     INVOKANA 300 MG Tabs tablet  Generic drug:  canagliflozin  TAKE 1 TABLET BY MOUTH DAILY BEFORE BREAKFAST     KLOR-CON M10 10 MEQ tablet  Generic drug:  potassium chloride     losartan 100 MG tablet  Commonly known as:  COZAAR  Take 100 mg by mouth daily.     Magnesium Oxide 500 MG Caps  Take 1,000 mg by mouth 2 (two) times daily.     metFORMIN 500 MG 24 hr tablet  Commonly known as:  GLUCOPHAGE-XR  TAKE (3) TABLETS BY MOUTH DAILY     mupirocin ointment 2 %  Commonly known as:  BACTROBAN     NOVOLOG [place]  Inject 15 Units into the skin 2 (two) times daily.     omega-3 acid ethyl esters 1 G capsule  Commonly known as:  LOVAZA  Take 1,200 g by mouth 3 (three) times daily.     PATADAY 0.2 % Soln  Generic drug:  Olopatadine HCl  PUT 1 DROP INTO BOTH EYES EVERY DAY AS NEEDED **DRUG NOT COVERED**.     pregabalin 100 MG capsule  Commonly known as:  LYRICA  Take 300 mg by mouth 3 (three) times daily.     ramipril 10 MG capsule  Commonly known as:  ALTACE     rivaroxaban 20 MG Tabs tablet  Commonly known as:  XARELTO  Take 1 tablet (20 mg  total) by mouth daily with supper.     traMADol 50 MG tablet  Commonly known as:  ULTRAM  Take 1 tablet (50 mg total) by mouth every 6 (six) hours as needed.     Vitamin B-12 2000 MCG Tbcr  Take 3 tablets by mouth daily.     vitamin C  500 MG tablet  Commonly known as:  ASCORBIC ACID  Take 1,500 mg by mouth daily.     Vitamin D (Cholecalciferol) 1000 UNITS Tabs  Take 3,000 Units by mouth daily.     vitamin E 400 UNIT capsule  Take 1,200 Units by mouth daily.        Allergies: No Known Allergies  Past Medical History  Diagnosis Date  . CAD (coronary artery disease), native coronary artery September 2008    Left main 50-60%, LAD 80% with D2 involved. RCA mid occlusion --> CABG  . 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 Ischemic cardiomyopathy September 2008    EF = 35-40%; THE 45-50% IN 2013 --> ECHO 03/03/13 - EF 40-45% LV: Moderate HK of basal-midlateral & inferior myocardium; c/w prior infarction in the distribution of the RCA or LCx. Mild-Mod MR with Calcified annulus. Mod TR  . History of MI (myocardial infarction) 2012    Noted by echocardiography, and nuclear stress testing. Known RCA occlusion prior to CABG  . PAF (paroxysmal atrial fibrillation) - initial presentation with RVR, and heart failure 2013    On a beta blocker, calcium channel blocker & Xarelto. Lexiscan Myoview 11/08/11 LV was enlarged with no evidence of ischemia. There was a moderate sized, moderate to severe in intensity fixed defect in the inferior wall suggestive of scar with no reversible ischemia. There was a basal septal akinesis w/severe global hypokinesis. EF = 31% (Which is different from the [facility] EF)  . Chronic diastolic CHF (congestive heart failure), NYHA class 2 (HCC) 2009, 2011; 2013    Exacerbated by A. fib RVR, currently on Lasix.  . Peripheral arterial disease (HCC) 2009, 2011; 2013    2009, 2011; 2013  . Renal artery stenosis, non-flow-limiting (HCC)   . DM (diabetes mellitus), type 2 with neurological complications (HCC)     Peripheral neuropathy  . Diabetic peripheral neuropathy associated with type 2 diabetes mellitus (HCC)   . Hyperlipidemia LDL goal <70     On statin therapy  .  Obesity (BMI 30.0-34.9)   . Hypertension associated with diabetes (HCC)   . Erectile dysfunction associated with type 2 diabetes mellitus (HCC)  2006  . Ischemic mitral regurgitation  1980s    Mild to moderate  . Thrombocytopenia (HCC)      chronic  . Osteoarthritis of both knees 1990    Status post left knee a arthroscopy, currently [place] right knee  . History of hepatitis B 1980s  . Adopted     Adopted [A54.09]    Past Surgical History  Procedure Laterality Date  . Femoral endarterectomy  03-15-2008    Left EIA & CFA endarterectomy by Dr. Madilyn Fireman  . Knee arthroscopy Left 1990    For osteoarthritis pain.  . Tonsillectomy    . Pleural effusion drainage Right   . Coronary artery bypass graft  03/2007    CABG x 6 LIMA-LAD, left radial to diagonal 1, SVG to OM1-OM2 sequential, SVG to PDA-PLA sequential.  . Transthoracic echocardiogram  November 2014    EF 40-45%. Moderate hypokinesis of the basal and mid lateral  and inferior myocardium. Mild to moderate MR. Mild LA dilation.  . Transthoracic echocardiogram  07/26/2013    In setting of acute illness: EF 15-20%, atrial fibrillation; diffuse hypokinesis with inferolateral abnormalities consistent with ischemic heart disease; grade 3 diastolic dysfunction/restrictive physiology - elevated LVEDP/LAP..  . Transthoracic echocardiogram  09/09/2013    40%. Septal hypokinesis. Moderate to severely dilated LV. Mild LVH and diffuse hypokinesis. Mild to moderate MR.  Marland Kitchen Nm myoview ltd  08/13/2013    Moderate LV dilation. Abnormal study with all high-risk features, unchanged from October 2014. Inferior defect likely representing nontransmural scar. Suggesting nonischemic cardiopathy superimposed on known ischemic cardiomyopathy.; Severe global hypokinesis with EF of roughly 31%.  . Vascular surgery Bilateral 2006    pt reports about 4 vascular surgeries starting in 2006 and last was in 2013.  Marland Kitchen Patch angioplasty Left 09/16/2013    Procedure: PATCH  ANGIOPLASTY OF LEFT BELOW THE KNEE POPLITEAL ARTERY;  Surgeon: Fransisco Hertz, MD;  Location: [facility] OR;  Service: Vascular;  Laterality: Left;  . Intraoperative arteriogram Left 09/16/2013    Procedure: INTRA OPERATIVE ARTERIOGRAM;  Surgeon: Fransisco Hertz, MD;  Location: [facility] OR;  Service: Vascular;  Laterality: Left;  . Groin dissection Left 09/16/2013    Procedure: GROIN EXPLORATION;  Surgeon: Fransisco Hertz, MD;  Location: [facility] OR;  Service: Vascular;  Laterality: Left;  . Endarterectomy popliteal Left 09/16/2013    Procedure: ENDARTERECTOMYOF LEFT POPLITEAL ARTERY;  Surgeon: Fransisco Hertz, MD;  Location: [facility] OR;  Service: Vascular;  Laterality: Left;  . Abdominal aortagram N/A 08/30/2011    Procedure: ABDOMINAL Ronny Flurry;  Surgeon: Fransisco Hertz, MD;  Location: [facility] CATH LAB;  Service: Cardiovascular;  Laterality: N/A;  . Percutaneous stent intervention Left 08/30/2011    Procedure: PERCUTANEOUS STENT INTERVENTION;  Surgeon: Fransisco Hertz, MD;  Location[facility] CATH LAB;  Service: Cardiovascular;  Laterality: Left;  . Lower extremity angiogram Left 09/07/2013    Procedure: LOWER EXTREMITY ANGIOGRAM;  Surgeon: Fransisco Hertz, MD;  Location: [facility] CATH LAB;  Service: Cardiovascular;  Laterality: Left;  . Nm myoview ltd  07/22/2014    EF ~35%. High-Risk study do to moderate size, medial intensity partially reversible defect consistent with prior infave High-Risk do to reduced LV function; inferior akinesia and global hypokinesis     Family History  Problem Relation Age of Onset  . Adopted: Yes  . Cancer Mother     Social History:  reports that he quit smoking about 9 years ago. His smoking use included Cigarettes. He started smoking about 41 years ago. He smoked 0.50 packs per day. He has never used smokeless tobacco. He reports that he does not drink alcohol or use illicit drugs.    Review of Systems         Most recent eye exam was 2015        Lipids: On Lipitor  without adequate control       Lab Results    Component Value Date   CHOL 186 07/22/2014   HDL 41 07/22/2014   LDLCALC 120* 07/22/2014   TRIG 127 07/22/2014   CHOLHDL 4.5 07/22/2014                  The blood pressure has been controlled with Losartan and Carvedilol  Complications: Peripheral neuropathy, cardiovascular disease, peripheral vascular disease    Calf pain on walking is present   Last diabetic foot exam in 3/16 showed slightly decreased monofilament sensation and absent pulses  LABS:  Office Visit on 04/07/2015  Component Date Value Ref Range Status  . Hemoglobin A1C 04/07/2015 10.5   Final    Physical Examination:  BP 145/82 mmHg  Pulse 64  Temp(Src) 98.2 F (36.8 C)  Resp 16  Ht 5\' 11"  (1.803 m)  Wt 242 lb 3.2 oz (109.861 kg)  BMI 33.79 kg/m2  SpO2 97%             .        ASSESSMENT:  Diabetes type 2, uncontrolled with insulin resistance and moderate obesity.    His A1c has been persistently high and he has been noncompliant with his follow-up A1c still over 10% He has difficulty complying with mealtime insulin and probably does not check his blood sugars as directed also He does need Lantus twice a day but he is not willing to increase his number of injections, Toujeo is not covered by insurance  Does not follow any meal plan and he thinks he can do better with his diet He is not very active and currently not able to exercise much because of leg pains He also will benefit from overall detailed diabetes education with the nurse educator  HYPERLIPIDEMIA: Not controlled with 40 mg Lipitor alone and needs follow-up levels   PLAN:   Start taking U-500 insulin instead of Novolog and Lantus with the KwikPen.  Discussed in detail the differences between this and other insulins types as well as timing of injection, formulation, dosage dialing on the pen  He will start with 25 units at breakfast and 60 at lunch and supper and the dose may need to be further adjusted, discussed potential for  hypoglycemia including overnight  In the meantime will finish off his samples of Toujeo with taking 80 units in the morning  Consultation with nurse educator in about 2 weeks  Consistent monitoring of blood sugars including after meals, discussed blood sugar targets  Trial of Pletal 50 mg twice a day for claudication Discussed improve compliance to prevent diabetic complications   Patient Instructions  Toujeo 80 units on waking up  Novolog 15 at Bfst 25 at lunch and supper  Metformin 1 at lunch and and 2 at supper   WHEN OUT of Toujeo start U-500 start 30 min before each meal:  25 units at Bfst 60 at lunch and 40 at supper  Check blood sugars on waking up .. 3-4.. times a week Also check blood sugars about 2 hours after a meal and do this after different meals by rotation Recommended blood sugar levels on waking up is 90-130 and about 2 hours after meal is 140-180 Please bring blood sugar monitor to each visit.      Counseling time on subjects discussed above is over 50% of today's 25 minute visit  Bernardina Cacho 04/07/2015, 10:35 AM   Note: This office note was prepared with Insurance underwriter. Any transcriptional errors that result from this process are unintentional.

## 2015-04-07 NOTE — Patient Instructions (Signed)
Toujeo 80 units on waking up  Novolog 15 at Bfst 25 at lunch and supper  Metformin 1 at lunch and and 2 at supper   WHEN OUT of Toujeo start U-500 start 30 min before each meal:  25 units at Bfst 60 at lunch and 40 at supper  Check blood sugars on waking up .. 3-4.. times a week Also check blood sugars about 2 hours after a meal and do this after different meals by rotation Recommended blood sugar levels on waking up is 90-130 and about 2 hours after meal is 140-180 Please bring blood sugar monitor to each visit.

## 2015-04-08 ENCOUNTER — Encounter: Payer: Self-pay | Admitting: Podiatry

## 2015-04-08 ENCOUNTER — Ambulatory Visit (INDEPENDENT_AMBULATORY_CARE_PROVIDER_SITE_OTHER): Payer: 59 | Admitting: Podiatry

## 2015-04-08 VITALS — BP 80/43 | HR 77 | Resp 18

## 2015-04-08 DIAGNOSIS — L84 Corns and callosities: Secondary | ICD-10-CM

## 2015-04-08 DIAGNOSIS — E1159 Type 2 diabetes mellitus with other circulatory complications: Secondary | ICD-10-CM | POA: Diagnosis not present

## 2015-04-08 NOTE — Progress Notes (Signed)
Patient ID: William Pacheco, male   DOB: July 09, 1950, 64 y.o.   MRN: [id number]  Subjective: 64 year old male presents the office today for evaluation of an ulceration on the left fifth toe. He believes that the areas healed at this time. He has apply moisturizer to his feet intermittently to the dry skin. He denies any surrounding redness or drainage from the site of ulceration on the left fifth toe. He denies any swelling. No other complaints at this time in no acute changes. He denies any systemic complaints as fevers, chills, nausea, vomiting. No calf pain, chest pain, shortness of breath.  Objective: AAO 3, NAD DP/PT pulses decrease, CRT less than 3 seconds Protective sensation decreased with Simms Weinstein monofilament Hyperkeratotic lesion on the lateral aspect of the left fifth toe. Upon debridement underlying ulceration appears to be healed. There is no surrounding erythema, edema, drainage. There is no clinical signs of infection. Hyperkeratotic lesions medial first metatarsal head and medial hallux. Upon debridement no underlying ulceration, drainage or other signs of infection. No other open lesions or pre-ulcerative lesions identified bilaterally. No areas of tenderness to bilateral lower extremity is. There is no edema, erythema, increased warmth bilaterally. There is no pain with calf compression, swelling, warmth, erythema.  Assessment: 64 year old male with healed ulceration left fifth toe; pre-ulcerative calluses  Plan: -Treatment options discussed including all alternatives, risks, and complications -Lesion on the left 5th toe is healed at this times. No underlying ulceration at this time. Continues to monitor for any further skin breakdown of reoccurrence. -Pre-ulcerative calluses were debrided without complication/bleeding. The underlying skin is intact. -Daily foot inspection. -Continue follow up with vascular surgery. -Follow-up in 3 months or sooner if any problems are  to arise. In the meantime call the office with any questions, concerns, change in symptoms.  Ovid Curd, DPM

## 2015-04-13 ENCOUNTER — Encounter: Payer: 59 | Attending: Endocrinology | Admitting: Nutrition

## 2015-04-13 DIAGNOSIS — E1140 Type 2 diabetes mellitus with diabetic neuropathy, unspecified: Secondary | ICD-10-CM | POA: Diagnosis present

## 2015-04-13 DIAGNOSIS — Z713 Dietary counseling and surveillance: Secondary | ICD-10-CM | POA: Insufficient documentation

## 2015-04-13 DIAGNOSIS — E1165 Type 2 diabetes mellitus with hyperglycemia: Secondary | ICD-10-CM | POA: Diagnosis not present

## 2015-04-13 DIAGNOSIS — IMO0002 Reserved for concepts with insufficient information to code with codable children: Secondary | ICD-10-CM

## 2015-04-13 NOTE — Patient Instructions (Signed)
1. Eat 3 meals: 9AM, 2-3PM, and 8PM:  2.  Take Novolog until you finish Toujeo: 15u before breakfast, and 25u before lunch and supper 3. When Toujeo is finished, Take U-500 insulin 30 min. Before 3 meals:  25u before breakfast, 60u before lunch, and 40 units before supper. 4.  Test blood sugars before meals and at bedtime. 5,  Stop the raisin bran cereal.  Switch to 2 pieces of toast with peanut butter, or cheese, or have meat and 2 cups of milk.

## 2015-04-13 NOTE — Progress Notes (Signed)
Patient is still on Toujeo.  He is taking 80 u at HS.  Novolog is 15u acB and acS.  He should be on 25u acLunch and supper.  He was told this.  He said he only has 3 more days of Toujeo left. We discussed the timing and how the R insulin will work, and the need to wait 30 min. After taking it before eating.  Written instructions were given for this.    SBGM:  He did not bring his meter.  Says FBSs are between 160-200, and acS are usually less than 160.    Typical day. Up at 8AM. Tests blood sugars.   Drinks 2 cups of coffee.  1-2PM:  Eats breakfast  Says blood sugars are usually over 200 at this time.  Eats 2 eggs, 2 pieces of toast with butter, and water, or raisin Bran cereal 5PM: snack of 8-10 crackers with peanut butter 8:30PM supper:  Salad, meat/chicken/pork, 6 ounces, 1-2 starchy veg. And one non starchy veg.  16 ounces of milk  Insulin Dose:  Takes 15u acB (1PM), and 15u acS(8PM)  Plan: 1. Eat 3 meals: 9AM, 2-3PM, and 8PM:  2.  Take Novolog until you finish Toujeo: 15u before breakfast, and 25u before lunch and supper 3. When Toujeo is finished, Take U-500 insulin 30 min. Before 3 meals:  25u before breakfast, 60u before lunch, and 40 units before supper. 4.  Test blood sugars before meals and at bedtime. 5,  Stop the raisin bran cereal.  Switch to 2 pieces of toast with peanut butter, or cheese, or have meat and 2 cups of milk.

## 2015-05-03 ENCOUNTER — Other Ambulatory Visit: Payer: Self-pay | Admitting: Endocrinology

## 2015-05-31 ENCOUNTER — Encounter: Payer: Self-pay | Admitting: Family

## 2015-06-02 HISTORY — PX: OTHER SURGICAL HISTORY: SHX169

## 2015-06-03 ENCOUNTER — Encounter: Payer: Self-pay | Admitting: Family

## 2015-06-03 ENCOUNTER — Ambulatory Visit (HOSPITAL_COMMUNITY)
Admission: RE | Admit: 2015-06-03 | Discharge: 2015-06-03 | Disposition: A | Discharge status: Home / Self Care | Payer: 59 | Source: Ambulatory Visit | Attending: Family | Admitting: Family

## 2015-06-03 ENCOUNTER — Ambulatory Visit (INDEPENDENT_AMBULATORY_CARE_PROVIDER_SITE_OTHER): Payer: 59 | Admitting: Family

## 2015-06-03 VITALS — BP 121/68 | HR 77 | Temp 97.0°F | Resp 16 | Ht 71.0 in | Wt 245.0 lb

## 2015-06-03 DIAGNOSIS — Z959 Presence of cardiac and vascular implant and graft, unspecified: Secondary | ICD-10-CM | POA: Diagnosis not present

## 2015-06-03 DIAGNOSIS — E1151 Type 2 diabetes mellitus with diabetic peripheral angiopathy without gangrene: Secondary | ICD-10-CM

## 2015-06-03 DIAGNOSIS — I70219 Atherosclerosis of native arteries of extremities with intermittent claudication, unspecified extremity: Secondary | ICD-10-CM

## 2015-06-03 DIAGNOSIS — R0989 Other specified symptoms and signs involving the circulatory and respiratory systems: Secondary | ICD-10-CM

## 2015-06-03 DIAGNOSIS — Z87891 Personal history of nicotine dependence: Secondary | ICD-10-CM | POA: Insufficient documentation

## 2015-06-03 DIAGNOSIS — E1140 Type 2 diabetes mellitus with diabetic neuropathy, unspecified: Secondary | ICD-10-CM | POA: Diagnosis not present

## 2015-06-03 DIAGNOSIS — I70213 Atherosclerosis of native arteries of extremities with intermittent claudication, bilateral legs: Secondary | ICD-10-CM | POA: Diagnosis not present

## 2015-06-03 DIAGNOSIS — Z95828 Presence of other vascular implants and grafts: Secondary | ICD-10-CM

## 2015-06-03 DIAGNOSIS — E785 Hyperlipidemia, unspecified: Secondary | ICD-10-CM | POA: Insufficient documentation

## 2015-06-03 NOTE — Progress Notes (Signed)
[facility] [place] HISTORY AND PHYSICAL -PAD  History of Present Illness William Pacheco is a 64 y.o. male patient of Dr. Bridgett Larsson who presents with continued short distance intermittent claudication.  Previous procedures include:  1. Left popliteal fossa exploration, Left popliteal EA and patch angioplasty, aborted femoropopliteal bypass 2. PTA+S L SFA (08/14/11) 3. L EIA and CFA EA w/ DPA (9/14/9) by Dr. Amedeo Plenty 4. R CFA to BK bypass with goretex (2/6/7) by Dr Amedeo Plenty 5. R CFA EA c/ DPA(3/17/8) by Dr. Amedeo Plenty 6. PTA+S L SFA (7/25/8) by Dr. Amedeo Plenty 7. L SFA Directional atherectomy (4/13/7) by Dr. Amedeo Plenty 8. L SFA Directional atherectomy (9/14/7) by Dr. Amedeo Plenty  The patient's symptoms have not progressed. The patient's prior heel lacerations healed. He currently has a L 5th toe shallow ulcer. Per the patient, this appears to be healing slowly. The patient's treatment regimen currently included: maximal medical management.  The patient was last seen by Dr. Bridgett Larsson on 11/05/14. At that time pt continued to have short distal intermittent claudication.  The left heel healed on its own. Dr. Bridgett Larsson suspects the left 5th toe will also heal on its own as it demonstrated a shallow ulcer with clean granulation tissue. The patient would need a L CFA to AT bypass with a lateral approach. This patient's right GSV was harvested already. His L GSV supposed was harvested for his CABG. So a bypass conduit would have been constructed from PTFE + arm vein vs. Cryovein. Given the potential limb threatening status of such a procedure once the bypass occludes, Dr. Bridgett Larsson recommended we hold off for now. Based on the patient's vascular studies and examination, Dr. Bridgett Larsson offered the patient: ABI, RLE arterial duplex q 3 months. Pt returns today for 3 months follow up.  Pt reports that he walks a total of about 2 hours daily with left calf pain after about 3 minutes of walking.  He does not seem to have  claudication sx's in his right leg, has right knee OA pain for which he receives a yearly lubricant injection.  He is performing seated leg exercises daily.  He sees a podiatrist on a regular basis who was managing his left 5th toe ulcer which has healed. The patient denies New Medical or Surgical History.  He denies any hx of stroke or TIA.  Pt Diabetic: Yes, last A1C was 10.5 in October 2016,  worse than the previous 9.1, he is seeing Dr. Dwyane Dee, endocrinologist, since March 2016. Pt states he saw Dr. Dwyane Dee about 6 weeks ago. Pt smoker: former smoker, quit in 2006  Pt meds include: Statin :Yes Betablocker: Yes ASA: no Other anticoagulants/antiplatelets: taking Xaralto for history of atrial fib, low dose Pletal     Past Medical History  Diagnosis Date  . CAD (coronary artery disease), native coronary artery September 2008    Left main 50-60%, LAD 80% with D2 involved. RCA mid occlusion --> CABG  . 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 Ischemic cardiomyopathy September 2008    EF = 35-40%; THE 45-50% IN 2013 --> ECHO 03/03/13 - EF 40-45% LV: Moderate HK of basal-midlateral & inferior myocardium; c/w prior infarction in the distribution of the RCA or LCx. Mild-Mod MR with Calcified annulus. Mod TR  . History of MI (myocardial infarction) 2012    Noted by echocardiography, and nuclear stress testing. Known RCA occlusion prior to CABG  . PAF (paroxysmal atrial fibrillation) - initial  presentation with RVR, and heart failure 2013    On a beta blocker, calcium channel blocker & Xarelto. Lexiscan Myoview 11/08/11 LV was enlarged with no evidence of ischemia. There was a moderate sized, moderate to severe in intensity fixed defect in the inferior wall suggestive of scar with no reversible ischemia. There was a basal septal akinesis w/severe global hypokinesis. EF = 31% (Which is different from the [facility] EF)  . Chronic diastolic CHF (congestive heart  failure), NYHA class 2 ([place]) 2009, 2011; 2013    Exacerbated by A. fib RVR, currently on Lasix.  . Peripheral arterial disease ([place]) 2009, 2011; 2013    2009, 2011; 2013  . Renal artery stenosis, non-flow-limiting ([place])   . DM (diabetes mellitus), type 2 with neurological complications (HCC)     Peripheral neuropathy  . Diabetic peripheral neuropathy associated with type 2 diabetes mellitus ([place])   . Hyperlipidemia LDL goal <70     On statin therapy  . Obesity (BMI 30.0-34.9)   . Hypertension associated with diabetes ([place])   . Erectile dysfunction associated with type 2 diabetes mellitus ([place])  2006  . Ischemic mitral regurgitation  1980s    Mild to moderate  . Thrombocytopenia (HCC)      chronic  . Osteoarthritis of both knees 1990    Status post left knee a arthroscopy, currently [place] right knee  . History of hepatitis B 1980s  . Adopted     Adopted [V68.89]    Social History Social History  Substance Use Topics  . Smoking status: Former Smoker -- 0.50 packs/day    Types: Cigarettes    Start date: 07/02/1973    Quit date: 07/02/2005  . Smokeless tobacco: Never Used  . Alcohol Use: No    Family History Family History  Problem Relation Age of Onset  . Adopted: Yes  . Cancer Mother     Past Surgical History  Procedure Laterality Date  . Femoral endarterectomy  03-15-2008    Left EIA & CFA endarterectomy by Dr. Amedeo Plenty  . Knee arthroscopy Left 1990    For osteoarthritis pain.  . Tonsillectomy    . Pleural effusion drainage Right   . Coronary artery bypass graft  03/2007    CABG x 6 LIMA-LAD, left radial to diagonal 1, SVG to OM1-OM2 sequential, SVG to PDA-PLA sequential.  . Transthoracic echocardiogram  November 2014    EF 40-45%. Moderate hypokinesis of the basal and mid lateral and inferior myocardium. Mild to moderate MR. Mild LA dilation.  . Transthoracic echocardiogram  07/26/2013    In setting of acute illness: EF 15-20%, atrial fibrillation; diffuse  hypokinesis with inferolateral abnormalities consistent with ischemic heart disease; grade 3 diastolic dysfunction/restrictive physiology - elevated LVEDP/LAP..  . Transthoracic echocardiogram  09/09/2013    40%. Septal hypokinesis. Moderate to severely dilated LV. Mild LVH and diffuse hypokinesis. Mild to moderate MR.  Marland Kitchen Nm myoview ltd  08/13/2013    Moderate LV dilation. Abnormal study with all high-risk features, unchanged from October 2014. Inferior defect likely representing nontransmural scar. Suggesting nonischemic cardiopathy superimposed on known ischemic cardiomyopathy.; Severe global hypokinesis with EF of roughly 31%.  . Vascular surgery Bilateral 2006    pt reports about 4 vascular surgeries starting in 2006 and last was in 2013.  Marland Kitchen Patch angioplasty Left 09/16/2013    Procedure: PATCH ANGIOPLASTY OF LEFT BELOW THE KNEE POPLITEAL ARTERY;  Surgeon: Conrad [place], MD;  Location: [place];  Service: Vascular;  Laterality: Left;  . Intraoperative arteriogram  Left 09/16/2013    Procedure: INTRA OPERATIVE ARTERIOGRAM;  Surgeon: Conrad Greenwood, MD;  Location: [employer];  Service: Vascular;  Laterality: Left;  . Groin dissection Left 09/16/2013    Procedure: GROIN EXPLORATION;  Surgeon: Conrad [place], MD;  Location: [place];  Service: Vascular;  Laterality: Left;  . Endarterectomy popliteal Left 09/16/2013    Procedure: ENDARTERECTOMYOF LEFT POPLITEAL ARTERY;  Surgeon: Conrad Limestone, MD;  Location: [place];  Service: Vascular;  Laterality: Left;  . Abdominal aortagram N/A 08/30/2011    Procedure: ABDOMINAL Maxcine Ham;  Surgeon: Conrad Hudson, MD;  Location[facility] C[facility] LAB;  Service: Cardiovascular;  Laterality: N/A;  . Percutaneous stent intervention Left 08/30/2011    Procedure: PERCUTANEOUS STENT INTERVENTION;  Surgeon: Conrad Redfield, MD;  Location: [facility] CATH LAB;  Service: Cardiovascular;  Laterality: Left;  . Lower extremity angiogram Left 09/07/2013    Procedure: LOWER EXTREMITY ANGIOGRAM;  Surgeon: Conrad , MD;   Location: [facility] CATH LAB;  Service: Cardiovascular;  Laterality: Left;  . Nm myoview ltd  07/22/2014    EF ~35%. High-Risk study do to moderate size, medial intensity partially reversible defect consistent with prior infave High-Risk do to reduced LV function; inferior akinesia and global hypokinesis     No Known Allergies  Current Outpatient Prescriptions  Medication Sig Dispense Refill  . amoxicillin-clavulanate (AUGMENTIN) 875-125 MG per tablet Take 1 tablet by mouth 2 (two) times daily. 20 tablet 1  . aspirin 81 MG tablet Take 81 mg by mouth daily.    Marland Kitchen atorvastatin (LIPITOR) 40 MG tablet Take 1 tablet (40 mg total) by mouth daily. (Patient taking differently: Take 20 mg by mouth daily. ) 30 tablet 6  . Calcium Carb-Cholecalciferol (CALCIUM 1000 + D PO) Take 2 tablets by mouth daily.    . calcium carbonate (OS-CAL) 600 MG TABS tablet Take 600 mg by mouth 2 (two) times daily with a meal. Take two tablets daily    . carvedilol (COREG) 25 MG tablet Take 37.5 mg by mouth 2 (two) times daily with a meal. (1 and 1/2 tablets)    . cilostazol (PLETAL) 50 MG tablet TAKE 1 TABLET (50 MG TOTAL) BY MOUTH 2 (TWO) TIMES DAILY. 60 tablet 0  . Cyanocobalamin (VITAMIN B-12) 2000 MCG TBCR Take 3 tablets by mouth daily.    . furosemide (LASIX) 40 MG tablet Take 1 tablet (40 mg total) by mouth 2 (two) times daily. (Patient taking differently: Take 20 mg by mouth 2 (two) times daily. ) 60 tablet 11  . Glucosamine-Chondroit-Vit C-Mn (GLUCOSAMINE CHONDR 1500 COMPLX PO) Take 1,500 mg by mouth daily.    . insulin regular human CONCENTRATED (HUMULIN R U-500 KWIKPEN) 500 UNIT/ML injection Inject 25 units at breakfast, 60 units at lunch and 40 units at breakfast 4 pen 2  . INVOKANA 300 MG TABS tablet TAKE 1 TABLET BY MOUTH DAILY BEFORE BREAKFAST 30 tablet 0  . KLOR-CON M10 10 MEQ tablet     . losartan (COZAAR) 100 MG tablet Take 100 mg by mouth daily.    . Magnesium Oxide 500 MG CAPS Take 1,000 mg by mouth 2 (two) times  daily.     . metFORMIN (GLUCOPHAGE-XR) 500 MG 24 hr tablet TAKE (3) TABLETS BY MOUTH DAILY 90 tablet 2  . mupirocin ointment (BACTROBAN) 2 %     . omega-3 acid ethyl esters (LOVAZA) 1 G capsule Take 1,200 g by mouth 3 (three) times daily.     Marland Kitchen PATADAY 0.2 % SOLN  PUT 1 DROP INTO BOTH EYES EVERY DAY AS NEEDED **DRUG NOT COVERED**.  2  . pregabalin (LYRICA) 100 MG capsule Take 300 mg by mouth 3 (three) times daily.     . ramipril (ALTACE) 10 MG capsule     . Rivaroxaban (XARELTO) 20 MG TABS tablet Take 1 tablet (20 mg total) by mouth daily with supper. 30 tablet 0  . traMADol (ULTRAM) 50 MG tablet Take 1 tablet (50 mg total) by mouth every 6 (six) hours as needed. 50 tablet 0  . vitamin C (ASCORBIC ACID) 500 MG tablet Take 1,500 mg by mouth daily.    . Vitamin D, Cholecalciferol, 1000 UNITS TABS Take 3,000 Units by mouth daily.    . vitamin E 400 UNIT capsule Take 1,200 Units by mouth daily.    . Insulin Aspart (NOVOLOG [place]) Inject 15 Units into the skin 2 (two) times daily.     . Insulin Glargine (LANTUS SOLOSTAR) 100 UNIT/ML Solostar Pen Inject 75 Units into the skin daily. (Patient not taking: Reported on 04/07/2015) 30 mL 2  . Insulin Glargine (TOUJEO SOLOSTAR) 300 UNIT/ML SOPN Inject 70 Units into the skin.    . [DISCONTINUED] amLODipine (NORVASC) 10 MG tablet Take 10 mg by mouth daily.    . [DISCONTINUED] pioglitazone-metformin (ACTOPLUS MET) 15-850 MG per tablet Take 1 tablet by mouth 2 (two) times daily with a meal.    . [DISCONTINUED] simvastatin (ZOCOR) 40 MG tablet Take 40 mg by mouth every evening.     No current facility-administered medications for this visit.    ROS: See HPI for pertinent positives and negatives.   Physical Examination  Filed Vitals:   06/03/15 1058  BP: 121/68  Pulse: 77  Temp: 97 F (36.1 C)  TempSrc: Oral  Resp: 16  Height: $Remove'5\' 11"'XHRxmIs$  (1.803 m)  Weight: 245 lb (111.131 kg)  SpO2: 97%   Body mass index is 34.19 kg/(m^2).  General: A&O x 3, WDWN,  obese male.  Pulmonary: Sym exp, good air movt, CTAB, no rales, rhonchi, or wheezing  Cardiac: Regualr rate and rhythm, no detected murmur.   Vascular: Vessel Right Left  Radial notPalpable Not Palpable  Brachial Palpable Palpable  Carotid Palpable, with bruit Palpable, with bruit  Aorta Not palpable N/A  Femoral Not Palpable(obese) not Palpable(obese)  Popliteal Not palpable Not palpable  PT not Palpable Not Palpable  DP Not Palpable Not Palpable   Gastrointestinal: soft, NTND, -G/R, - HSM, - masses palpated, - CVAT B  Musculoskeletal: M/S 5/5 throughout , Extremities without ischemic changes , no gangrene in either foot. Left 5th toe ulcer has healed. Fissures in left heel have healed.  Neurologic: Pain and light touch intact in extremities , Motor exam as listed above.  CN 2-12 intact.                     May of 2015 Carotid Duplex at [facility]: - Technically difficult due to high bifurcations. tortuosity, and thickness of the neck. - Bilateral - 1% to 39 % ICA stenosis. Vertebral artery flow on the right is antegrade. Left vertebral artery flow is dampened and somewhat spiked.     Non-Invasive Vascular Imaging: DATE: 06/03/2015 ABI: RIGHT: 0.78 (0.57, 02/24/15), Waveforms: monophasic, TBI: 0.56;  LEFT: 0.51 (0.33), Waveforms: monophasic; TBI: absent   ASSESSMENT: William Pacheco is a 64 y.o. male who is s/p left groin, above-the-knee and below-the-knee popliteal artery exploration, Left below-the-knee popliteal artery endarterectomy with bovine patch angioplasty, Open cannulation of left  common femoral artery Left leg runoff on 09/16/2013.  He is also s/p R fem-pop BPG by Dr. Amedeo Plenty and L distal SFA stenting (08/30/11). 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 He has been performing seated leg exercises daily  and bilateral ABI's have improved slightly even though his DM control has worsened.  Last carotid duplex on file was from May 2015 with minimal bilateral ICA stenoses. He has bilateral carotid bruits and has uncontrolled DM as a major atherosclerotic risk factor; will check carotid duplex on his reutrn in 6 months.  Face to face time with patient was 25 minutes. Over 50% of this time was spent on counseling and coordination of care.    PLAN:  Graduated walking program and continue daily seated leg exercises.  Based on the patient's vascular studies and examination, pt will return to clinic in 6 months with ABI's, bilateral LE arterial Duplex, and carotid duplex. He knows to return sooner if needed.  I discussed in depth with the patient the nature of atherosclerosis, and emphasized the importance of maximal medical management including strict control of blood pressure, blood glucose, and lipid levels, obtaining regular exercise, and continued cessation of smoking.  The patient is aware that without maximal medical management the underlying atherosclerotic disease process will progress, limiting the benefit of any interventions.  The patient was given information about PAD including signs, symptoms, treatment, what symptoms should prompt the patient to seek immediate medical care, and risk reduction measures to take.  Clemon Chambers, RN, MSN, FNP-C [facility] Phone: [phone]  Clinic MD: Bridgett Larsson  06/03/2015 11:13 AM

## 2015-06-03 NOTE — Patient Instructions (Signed)
Peripheral Vascular Disease Peripheral vascular disease (PVD) is a disease of the blood vessels that are not part of your heart and brain. A simple term for PVD is poor circulation. In most cases, PVD narrows the blood vessels that carry blood from your heart to the rest of your body. This can result in a decreased supply of blood to your arms, legs, and internal organs, like your stomach or kidneys. However, it most often affects a person's lower legs and feet. There are two types of PVD.  Organic PVD. This is the more common type. It is caused by damage to the structure of blood vessels.  Functional PVD. This is caused by conditions that make blood vessels contract and tighten (spasm). Without treatment, PVD tends to get worse over time. PVD can also lead to acute ischemic limb. This is when an arm or limb suddenly has trouble getting enough blood. This is a medical emergency. CAUSES Each type of PVD has many different causes. The most common cause of PVD is buildup of a fatty material (plaque) inside of your arteries (atherosclerosis). Small amounts of plaque can break off from the walls of the blood vessels and become lodged in a smaller artery. This blocks blood flow and can cause acute ischemic limb. Other common causes of PVD include:  Blood clots that form inside of blood vessels.  Injuries to blood vessels.  Diseases that cause inflammation of blood vessels or cause blood vessel spasms.  Health behaviors and health history that increase your risk of developing PVD. RISK FACTORS  You may have a greater risk of PVD if you:  Have a family history of PVD.  Have certain medical conditions, including:  High cholesterol.  Diabetes.  High blood pressure (hypertension).  Coronary heart disease.  Past problems with blood clots.  Past injury, such as burns or a broken bone. These may have damaged blood vessels in your limbs.  Buerger disease. This is caused by inflamed blood  vessels in your hands and feet.  Some forms of arthritis.  Rare birth defects that affect the arteries in your legs.  Use tobacco.  Do not get enough exercise.  Are obese.  Are age 50 or older. SIGNS AND SYMPTOMS  PVD may cause many different symptoms. Your symptoms depend on what part of your body is not getting enough blood. Some common signs and symptoms include:  Cramps in your lower legs. This may be a symptom of poor leg circulation (claudication).  Pain and weakness in your legs while you are physically active that goes away when you rest (intermittent claudication).  Leg pain when at rest.  Leg numbness, tingling, or weakness.  Coldness in a leg or foot, especially when compared with the other leg.  Skin or hair changes. These can include:  Hair loss.  Shiny skin.  Pale or bluish skin.  Thick toenails.  Inability to get or maintain an erection (erectile dysfunction). People with PVD are more prone to developing ulcers and sores on their toes, feet, or legs. These may take longer than normal to heal. DIAGNOSIS Your health care provider may diagnose PVD from your signs and symptoms. The health care provider will also do a physical exam. You may have tests to find out what is causing your PVD and determine its severity. Tests may include:  Blood pressure recordings from your arms and legs and measurements of the strength of your pulses (pulse volume recordings).  Imaging studies using sound waves to take pictures of   the blood flow through your blood vessels (Doppler ultrasound).  Injecting a dye into your blood vessels before having imaging studies using:  X-rays (angiogram or arteriogram).  Computer-generated X-rays (CT angiogram).  A powerful electromagnetic field and a computer (magnetic resonance angiogram or MRA). TREATMENT Treatment for PVD depends on the cause of your condition and the severity of your symptoms. It also depends on your age. Underlying  causes need to be treated and controlled. These include long-lasting (chronic) conditions, such as diabetes, high cholesterol, and high blood pressure. You may need to first try making lifestyle changes and taking medicines. Surgery may be needed if these do not work. Lifestyle changes may include:  Quitting smoking.  Exercising regularly.  Following a low-fat, low-cholesterol diet. Medicines may include:  Blood thinners to prevent blood clots.  Medicines to improve blood flow.  Medicines to improve your blood cholesterol levels. Surgical procedures may include:  A procedure that uses an inflated balloon to open a blocked artery and improve blood flow (angioplasty).  A procedure to put in a tube (stent) to keep a blocked artery open (stent implant).  Surgery to reroute blood flow around a blocked artery (peripheral bypass surgery).  Surgery to remove dead tissue from an infected wound on the affected limb.  Amputation. This is surgical removal of the affected limb. This may be necessary in cases of acute ischemic limb that are not improved through medical or surgical treatments. HOME CARE INSTRUCTIONS  Take medicines only as directed by your health care provider.  Do not use any tobacco products, including cigarettes, chewing tobacco, or electronic cigarettes. If you need help quitting, ask your health care provider.  Lose weight if you are overweight, and maintain a healthy weight as directed by your health care provider.  Eat a diet that is low in fat and cholesterol. If you need help, ask your health care provider.  Exercise regularly. Ask your health care provider to suggest some good activities for you.  Use compression stockings or other mechanical devices as directed by your health care provider.  Take good care of your feet.  Wear comfortable shoes that fit well.  Check your feet often for any cuts or sores. SEEK MEDICAL CARE IF:  You have cramps in your legs  while walking.  You have leg pain when you are at rest.  You have coldness in a leg or foot.  Your skin changes.  You have erectile dysfunction.  You have cuts or sores on your feet that are not healing. SEEK IMMEDIATE MEDICAL CARE IF:  Your arm or leg turns cold and blue.  Your arms or legs become red, warm, swollen, painful, or numb.  You have chest pain or trouble breathing.  You suddenly have weakness in your face, arm, or leg.  You become very confused or lose the ability to speak.  You suddenly have a very bad headache or lose your vision.   This information is not intended to replace advice given to you by your health care provider. Make sure you discuss any questions you have with your health care provider.   Document Released: 07/26/2004 Document Revised: 07/09/2014 Document Reviewed: 11/26/2013 Elsevier Interactive Patient Education 2016 [employer]  

## 2015-06-06 ENCOUNTER — Other Ambulatory Visit: Payer: Self-pay | Admitting: Endocrinology

## 2015-06-07 ENCOUNTER — Other Ambulatory Visit: Payer: Self-pay | Admitting: Endocrinology

## 2015-06-17 ENCOUNTER — Other Ambulatory Visit: Payer: Self-pay | Admitting: Cardiology

## 2015-07-05 ENCOUNTER — Other Ambulatory Visit: Payer: Self-pay | Admitting: Endocrinology

## 2015-07-14 ENCOUNTER — Other Ambulatory Visit: Payer: Self-pay | Admitting: Endocrinology

## 2015-07-19 ENCOUNTER — Encounter: Payer: Self-pay | Admitting: Podiatry

## 2015-07-19 ENCOUNTER — Ambulatory Visit (INDEPENDENT_AMBULATORY_CARE_PROVIDER_SITE_OTHER): Payer: 59 | Admitting: Podiatry

## 2015-07-19 DIAGNOSIS — B351 Tinea unguium: Secondary | ICD-10-CM

## 2015-07-19 DIAGNOSIS — M79673 Pain in unspecified foot: Secondary | ICD-10-CM | POA: Diagnosis not present

## 2015-07-19 NOTE — Progress Notes (Signed)
Patient ID: William Pacheco, male   DOB: 1950-08-04, 65 y.o.   MRN: [id number] Complaint:  Visit Type: Patient returns to my office for continued preventative foot care services. Complaint: Patient states" my nails have grown long and thick and become painful to walk and wear shoes" Patient has been diagnosed with DM with neuropathy. The patient presents for preventative foot care services. No changes to ROS.  Severe dry feet.  Podiatric Exam: Vascular: dorsalis pedis and posterior tibial pulses are palpable bilateral. Capillary return is immediate. Temperature gradient is WNL. Skin turgor WNL  Sensorium: Normal Semmes Weinstein monofilament test right foot.  Absent LOPS left foot.. Normal tactile sensation bilaterally. Nail Exam: Pt has thick disfigured discolored nails with subungual debris noted bilateral entire nail hallux through fifth toenails Ulcer Exam: There is no evidence of ulcer or pre-ulcerative changes or infection. Orthopedic Exam: Muscle tone and strength are WNL. No limitations in general ROM. No crepitus or effusions noted. Foot type and digits show no abnormalities. Bony prominences are unremarkable. Skin: No Porokeratosis. No infection or ulcers.  Severe dry skin noted.  Diagnosis:  Onychomycosis, , Pain in right toe, pain in left toes  Treatment & Plan Procedures and Treatment: Consent by patient was obtained for treatment procedures. The patient understood the discussion of treatment and procedures well. All questions were answered thoroughly reviewed. Debridement of mycotic and hypertrophic toenails, 1 through 5 bilateral and clearing of subungual debris. No ulceration, no infection noted.  Return Visit-Office Procedure: Patient instructed to return to the office for a follow up visit 10 weeks  for continued evaluation and treatment.    Helane Gunther DPM

## 2015-08-19 ENCOUNTER — Telehealth: Payer: Self-pay | Admitting: Cardiology

## 2015-08-19 NOTE — Telephone Encounter (Signed)
Pt says he have gained some weight,he wants to know if he should take more Furosemide.

## 2015-08-19 NOTE — Telephone Encounter (Signed)
Pt called again,says he still was waiting to hear something.

## 2015-08-19 NOTE — Telephone Encounter (Signed)
Left msg to call.

## 2015-08-23 ENCOUNTER — Ambulatory Visit (INDEPENDENT_AMBULATORY_CARE_PROVIDER_SITE_OTHER): Payer: 59 | Admitting: Cardiology

## 2015-08-23 VITALS — BP 112/62 | HR 79 | Ht 71.0 in | Wt 248.0 lb

## 2015-08-23 DIAGNOSIS — I255 Ischemic cardiomyopathy: Secondary | ICD-10-CM

## 2015-08-23 DIAGNOSIS — I5042 Chronic combined systolic (congestive) and diastolic (congestive) heart failure: Secondary | ICD-10-CM

## 2015-08-23 DIAGNOSIS — I70213 Atherosclerosis of native arteries of extremities with intermittent claudication, bilateral legs: Secondary | ICD-10-CM

## 2015-08-23 DIAGNOSIS — I2510 Atherosclerotic heart disease of native coronary artery without angina pectoris: Secondary | ICD-10-CM

## 2015-08-23 DIAGNOSIS — I480 Paroxysmal atrial fibrillation: Secondary | ICD-10-CM

## 2015-08-23 DIAGNOSIS — E785 Hyperlipidemia, unspecified: Secondary | ICD-10-CM

## 2015-08-23 DIAGNOSIS — I10 Essential (primary) hypertension: Secondary | ICD-10-CM | POA: Diagnosis not present

## 2015-08-23 DIAGNOSIS — I252 Old myocardial infarction: Secondary | ICD-10-CM | POA: Diagnosis not present

## 2015-08-23 DIAGNOSIS — Z7901 Long term (current) use of anticoagulants: Secondary | ICD-10-CM

## 2015-08-23 DIAGNOSIS — I340 Nonrheumatic mitral (valve) insufficiency: Secondary | ICD-10-CM | POA: Diagnosis not present

## 2015-08-23 DIAGNOSIS — E669 Obesity, unspecified: Secondary | ICD-10-CM

## 2015-08-23 MED ORDER — FUROSEMIDE 40 MG PO TABS: ORAL_TABLET | ORAL | Status: AC

## 2015-08-23 NOTE — Patient Instructions (Signed)
TAKE FUROSEMIDE 80 MG IN THE MORNING AND 40 MG IN THE EVENING FOR 3 DAYS THEN  80 MG IN MORNING NAD 40 MG IN EVENING ON MONDAYS AND FRIDAYS  40 MG IN THE MORNING AND 40 MG IN THE EVENING THE OTHER DAYS   NO THERE CHANGES WITH MEDICATIONS  Your physician wants you to follow-up in 6 MONTH WITH DR HARDING.- 30 MIN  You will receive a reminder letter in the mail two months in advance. If you don't receive a letter, please call our office to schedule the follow-up appointment.

## 2015-08-23 NOTE — Progress Notes (Signed)
PCP: Dorrene German, MD  Clinic Note: Chief Complaint  Patient presents with  . Follow-up    pt states no chest pain no edems no light headedness or dizziness no SOB   . Coronary Artery Disease  . Atrial Fibrillation    HPI: William Pacheco is a 65 y.o. male with a PMH below who presents today for delayed 6 month f/u for CAD, ischemic cardiomyopathy, PAF as well as PAD. He also has chronic diastolic heart failure.  F/u of Myoview in January 2016 -- "high risk. We spent a long time during that visit reviewing his coronary anatomy and the stress test results. It seemed like like findings of amenable to work on would be very low, and access for him for cardiac catheterization would be very difficult based on all the scar tissue in his groins. Radial access would not work either because of his grafts.  William Pacheco was last seen on Nov 11, 2014  Recent Hospitalizations: None  Studies Reviewed: ABIs Dec 2016: See PSH  He is now in nutrition counseling (with Research scientist (medical) through insurance Co)  to discuss dietary modification because of weight gain, diabetes and other cardiac risk factors--> is hoping to get into H2[employer] aerobis.   Interval History: William Pacheco presents today really without any major cardiac complaints. He has not had any significant symptoms to suggest recurrence of A. fib with any palpitations or rapid irregular heartbeats. He does have some occasional skipped beats. No chest tightness or pressure with rest or exertion. He is really limited by claudication. He is unable to go me 45-50 feet before he has to stop his of excruciating calf claudication. Concerned about weight gain - no PND/orthopnea with stable mild edema. Limited by claudication - 40-50 feet. Tries to walk in back yard with dog.  Cardiac Review of Symptoms: No chest pain or shortness of breath with rest or exertion.  No palpitations, lightheadedness, dizziness, weakness or syncope/near syncope. No TIA/amaurosis  fugax symptoms. No melena, hematochezia, hematuria, or epstaxis.   ROS: A comprehensive was performed. Review of Systems  Constitutional: Positive for malaise/fatigue (Lack of energy. Easy fatigue; partly because it just is so hard to do things with his claudication).       Actual weight gain as opposed to weight loss  HENT: Positive for congestion.        He thinks he has a cold or allergies.  Respiratory: Positive for cough.   Cardiovascular: Positive for claudication (Is on Pletal now).  Gastrointestinal: Negative for blood in stool and melena.  Genitourinary: Negative for hematuria.  Musculoskeletal: Positive for joint pain (nees, hips and ankles).  Neurological: Negative for dizziness and seizures.  Endo/Heme/Allergies: Bruises/bleeds easily.  Psychiatric/Behavioral: Positive for depression (He is getting depressed about the fact that he really can't do any activity.  He is also upset about decreased intimate relations with his wife.). Negative for memory loss. The patient has insomnia. The patient is not nervous/anxious.   All other systems reviewed and are negative.    Past Medical History  Diagnosis Date  . CAD (coronary artery disease), native coronary artery September 2008    Left main 50-60%, LAD 80% with D2 involved. RCA mid occlusion --> CABG  . 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 Ischemic cardiomyopathy September 2008    EF = 35-40%; THE 45-50% IN 2013 --> ECHO 03/03/13 - EF 40-45% LV: Moderate HK of basal-midlateral & inferior myocardium;  c/w prior infarction in the distribution of the RCA or LCx. Mild-Mod MR with Calcified annulus. Mod TR  . History of MI (myocardial infarction) 2012    Noted by echocardiography, and nuclear stress testing. Known RCA occlusion prior to CABG  . PAF (paroxysmal atrial fibrillation) - initial presentation with RVR, and heart failure 2013    On a beta blocker, calcium channel blocker & Xarelto.  Lexiscan Myoview 11/08/11 LV was enlarged with no evidence of ischemia. There was a moderate sized, moderate to severe in intensity fixed defect in the inferior wall suggestive of scar with no reversible ischemia. There was a basal septal akinesis w/severe global hypokinesis. EF = 31% (Which is different from the [facility] EF)  . Chronic diastolic CHF (congestive heart failure), NYHA class 2 (HCC) 2009, 2011; 2013    Exacerbated by A. fib RVR, currently on Lasix.  . Peripheral arterial disease (HCC) 2009, 2011; 2013    2009, 2011; 2013  . Renal artery stenosis, non-flow-limiting (HCC)   . DM (diabetes mellitus), type 2 with neurological complications (HCC)     Peripheral neuropathy  . Diabetic peripheral neuropathy associated with type 2 diabetes mellitus (HCC)   . Hyperlipidemia LDL goal <70     On statin therapy  . Obesity (BMI 30.0-34.9)   . Hypertension associated with diabetes (HCC)   . Erectile dysfunction associated with type 2 diabetes mellitus (HCC)  2006  . Ischemic mitral regurgitation  1980s    Mild to moderate  . Thrombocytopenia (HCC)      chronic  . Osteoarthritis of both knees 1990    Status post left knee a arthroscopy, currently Petro Talent right knee  . History of hepatitis B 1980s  . Adopted     Adopted [D98.33]    Past Surgical History  Procedure Laterality Date  . Femoral endarterectomy  03-15-2008    Left EIA & CFA endarterectomy by Dr. Madilyn Fireman  . Knee arthroscopy Left 1990    For osteoarthritis pain.  . Tonsillectomy    . Pleural effusion drainage Right   . Coronary artery bypass graft  03/2007    CABG x 6 LIMA-LAD, left radial to diagonal 1, SVG to OM1-OM2 sequential, SVG to PDA-PLA sequential.  . Transthoracic echocardiogram  November 2014    EF 40-45%. Moderate hypokinesis of the basal and mid lateral and inferior myocardium. Mild to moderate MR. Mild LA dilation.  . Transthoracic echocardiogram  07/26/2013    In setting of acute illness: EF 15-20%, atrial  fibrillation; diffuse hypokinesis with inferolateral abnormalities consistent with ischemic heart disease; grade 3 diastolic dysfunction/restrictive physiology - elevated LVEDP/LAP..  . Transthoracic echocardiogram  09/09/2013    40%. Septal hypokinesis. Moderate to severely dilated LV. Mild LVH and diffuse hypokinesis. Mild to moderate MR.  Marland Kitchen Nm myoview ltd  08/13/2013    Moderate LV dilation. Abnormal study with all high-risk features, unchanged from October 2014. Inferior defect likely representing nontransmural scar. Suggesting nonischemic cardiopathy superimposed on known ischemic cardiomyopathy.; Severe global hypokinesis with EF of roughly 31%.  . Vascular surgery Bilateral 2006    pt reports about 4 vascular surgeries starting in 2006 and last was in 2013.  Marland Kitchen Patch angioplasty Left 09/16/2013    Procedure: PATCH ANGIOPLASTY OF LEFT BELOW THE KNEE POPLITEAL ARTERY;  Surgeon: Fransisco Hertz, MD;  Location: [facility] OR;  Service: Vascular;  Laterality: Left;  . Intraoperative arteriogram Left 09/16/2013    Procedure: INTRA OPERATIVE ARTERIOGRAM;  Surgeon: Fransisco Hertz, MD;  Location: [facility] OR;  Service: Vascular;  Laterality: Left;  . Groin dissection Left 09/16/2013    Procedure: GROIN EXPLORATION;  Surgeon: Fransisco Hertz, MD;  Location: [facility] OR;  Service: Vascular;  Laterality: Left;  . Endarterectomy popliteal Left 09/16/2013    Procedure: ENDARTERECTOMYOF LEFT POPLITEAL ARTERY;  Surgeon: Fransisco Hertz, MD;  Location: [facility] OR;  Service: Vascular;  Laterality: Left;  . Abdominal aortagram N/A 08/30/2011    Procedure: ABDOMINAL Ronny Flurry;  Surgeon: Fransisco Hertz, MD;  Location: [facility] CATH LAB;  Service: Cardiovascular;  Laterality: N/A;  . Percutaneous stent intervention Left 08/30/2011    Procedure: PERCUTANEOUS STENT INTERVENTION;  Surgeon: Fransisco Hertz, MD;  Location: [facility] CATH LAB;  Service: Cardiovascular;  Laterality: Left;  . Lower extremity angiogram Left 09/07/2013    Procedure: LOWER EXTREMITY ANGIOGRAM;   Surgeon: Fransisco Hertz, MD;  Location: [facility] CATH LAB;  Service: Cardiovascular;  Laterality: Left;  . Nm myoview ltd  07/22/2014    EF ~35%. High-Risk study do to moderate size, medial intensity partially reversible defect consistent with prior infave High-Risk do to reduced LV function; inferior akinesia and global hypokinesis   . Abis  December 2016    Are ABI 0.78 = moderate arterial occlusive disease; L ABI 0.51 = moderate-severe occlusive arterial disease   Prior to Admission medications   Medication Sig Start Date End Date Taking? Authorizing Provider  amoxicillin-clavulanate (AUGMENTIN) 875-125 MG per tablet Take 1 tablet by mouth 2 (two) times daily. 10/28/14  Yes Max T Hyatt, DPM  aspirin 81 MG tablet Take 81 mg by mouth daily.   Yes Historical Provider, MD  atorvastatin (LIPITOR) 40 MG tablet TAKE 1 TABLET (40 MG TOTAL) BY MOUTH DAILY. 06/20/15  Yes Marykay Lex, MD  Calcium Carb-Cholecalciferol (CALCIUM 1000 + D PO) Take 2 tablets by mouth daily.   Yes Historical Provider, MD  calcium carbonate (OS-CAL) 600 MG TABS tablet Take 600 mg by mouth 2 (two) times daily with a meal. Take two tablets daily   Yes Historical Provider, MD  carvedilol (COREG) 25 MG tablet Take 37.5 mg by mouth 2 (two) times daily with a meal. (1 and 1/2 tablets) 07/28/13  Yes Christiane Ha, MD  cilostazol (PLETAL) 50 MG tablet TAKE 1 TABLET (50 MG TOTAL) BY MOUTH 2 (TWO) TIMES DAILY. 07/05/15  Yes Reather Littler, MD  Cyanocobalamin (VITAMIN B-12) 2000 MCG TBCR Take 3 tablets by mouth daily.   Yes Historical Provider, MD  furosemide (LASIX) 40 MG tablet Take 1 tablet (40 mg total) by mouth 2 (two) times daily. Patient taking differently: Take 20 mg by mouth 2 (two) times daily.  08/10/14  Yes Marykay Lex, MD  Glucosamine-Chondroit-Vit C-Mn (GLUCOSAMINE CHONDR 1500 COMPLX PO) Take 1,500 mg by mouth daily.   Yes Historical Provider, MD  insulin regular human CONCENTRATED (HUMULIN R U-500 KWIKPEN) 500 UNIT/ML injection  Inject 25 units at breakfast, 60 units at lunch and 40 units at breakfast 04/07/15  Yes Reather Littler, MD  INVOKANA 300 MG TABS tablet TAKE 1 TABLET BY MOUTH DAILY BEFORE BREAKFAST 02/09/15  Yes Reather Littler, MD  KLOR-CON M10 10 MEQ tablet  10/01/13  Yes Historical Provider, MD  losartan (COZAAR) 100 MG tablet Take 100 mg by mouth daily.   Yes Historical Provider, MD  Magnesium Oxide 500 MG CAPS Take 1,000 mg by mouth 2 (two) times daily.    Yes Historical Provider, MD  metFORMIN (GLUCOPHAGE-XR) 500 MG 24 hr tablet TAKE (3) TABLETS BY MOUTH DAILY 07/14/15  Yes Reather Littler, MD  mupirocin ointment (BACTROBAN) 2 %  09/14/13  Yes Historical Provider, MD  omega-3 acid ethyl esters (LOVAZA) 1 G capsule Take 1,200 g by mouth 3 (three) times daily.    Yes Historical Provider, MD  PATADAY 0.2 % SOLN PUT 1 DROP INTO BOTH EYES EVERY DAY AS NEEDED **DRUG NOT COVERED**. 02/14/15  Yes Historical Provider, MD  pregabalin (LYRICA) 100 MG capsule Take 300 mg by mouth 3 (three) times daily.    Yes Historical Provider, MD  ramipril (ALTACE) 10 MG capsule  08/16/14  Yes Historical Provider, MD  Rivaroxaban (XARELTO) 20 MG TABS tablet Take 1 tablet (20 mg total) by mouth daily with supper. 07/28/13  Yes Christiane Ha, MD  traMADol (ULTRAM) 50 MG tablet Take 1 tablet (50 mg total) by mouth every 6 (six) hours as needed. 10/16/13  Yes Fransisco Hertz, MD  vitamin C (ASCORBIC ACID) 500 MG tablet Take 1,500 mg by mouth daily.   Yes Historical Provider, MD  Vitamin D, Cholecalciferol, 1000 UNITS TABS Take 3,000 Units by mouth daily.   Yes Historical Provider, MD  vitamin E 400 UNIT capsule Take 1,200 Units by mouth daily.   Yes Historical Provider, MD   No Known Allergies   Social History   Social History  . Marital Status: Married    Spouse Name: N/A  . Number of Children: 3  . Years of Education: N/A   Social History Main Topics  . Smoking status: Former Smoker -- 0.50 packs/day    Types: Cigarettes    Start date:  07/02/1973    Quit date: 07/02/2005  . Smokeless tobacco: Never Used  . Alcohol Use: No  . Drug Use: No  . Sexual Activity: Yes   Other Topics Concern  . None   Social History Narrative   He is a married father of 3, grandfather 1. Does not routinely exercise, does not drink or smoke alcohol or he works as a Copy, and has trouble making ends meet.   Family History  Problem Relation Age of Onset  . Adopted: Yes  . Cancer Mother     Wt Readings from Last 3 Encounters:  08/23/15 248 lb (112.492 kg)  06/03/15 245 lb (111.131 kg)  04/07/15 242 lb 3.2 oz (109.861 kg)  Was down to 230 lb.   PHYSICAL EXAM BP 112/62 mmHg  Pulse 79  Ht 5\' 11"  (1.803 m)  Wt 248 lb (112.492 kg)  BMI 34.60 kg/m2 General appearance: alert, cooperative, appears stated age, no distress and mild-moderately obese HEENT: [place]/AT, EOMI, MMM, anicteric sclera Neck: no adenopathy, no carotid bruit, no JVD and supple, symmetrical, trachea midline Lungs: clear to auscultation bilaterally, normal percussion bilaterally and Nonlabored, good air movement Heart: RRR, normal S1 and S2. Possible soft S4. Early peaking 1/6 SEM at the base radiating to carotid. Also 1/6 blowing HSM at left lower sternal border Abdomen: soft, non-tender; bowel sounds normal; no masses, no organomegaly and Mildly obese Extremities: No clubbing or cyanosis. Chronic PAD changes. Pulses: Still diminished but palpable bilaterally. Neurologic: Grossly normal; CN II through XII grossly intact   Adult ECG Report  Rate: 79 ;  Rhythm: normal sinus rhythm, premature atrial contractions (PAC), premature ventricular contractions (PVC) and Otherwise normal axis, intervals, and durations. Borderline increased voltage.; nonspecific ST and T-wave changes have actually improved compared to prior EKG  Narrative Interpretation: Stable EKG.   Other studies Reviewed: Additional studies/ records that were reviewed today include:  Recent Labs:  Lab  Results  Component Value Date   CHOL 148 04/07/2015   HDL 40.50 04/07/2015   LDLCALC 87 04/07/2015   TRIG 99.0 04/07/2015   CHOLHDL 4 04/07/2015   Lab Results  Component Value Date   CREATININE 1.09 04/07/2015   Lab Results  Component Value Date   K 4.7 04/07/2015    ASSESSMENT / PLAN: Problem List Items Addressed This Visit    PAF (paroxysmal atrial fibrillation) (HCC) (Chronic)    So far so good. No further recurrences now he has been on stable medications. I think as long as his volume levels are good, he tends to stay out of A. Fib.  On carvedilol for rate control. Anticoagulated with Xarelto - no bleeding complications  This patients CHA2DS2-VASc Score and unadjusted Ischemic Stroke Rate (% per year) is equal to 4.8 % stroke rate/year from a score of 4  Above score calculated as 1 point each if present [CHF, HTN, DM, Vascular=MI/PAD/Aortic Plaque, Age if 70-74, or Male]      Relevant Medications   furosemide (LASIX) 40 MG tablet   Obesity (BMI 30-39.9) (Chronic)    He is now leaks working with a Production assistant, radio for adjusting his diet. He is really concerned that gives he actually increased weight over the past month or so that he was not sure of. It may be related to change in diabetes medications but I'm not sure. He is without be fluid weight. We did increase his diuretic, but I don't think it is. Is mostly abdominal girth. In the absence of JVD, I'm not sure this is fluid weight.      Mild to Moderate mitral regurgitation by prior echocardiogram; dear rheumatic changes. (Chronic)    Would probably need to follow-up on his MR either this year or in 1 year follow-up from now. The murmur does not sound any worse, therefore no need to check it right away.      Relevant Medications   furosemide (LASIX) 40 MG tablet   Other Relevant Orders   EKG 12-Lead (Completed)   Ischemic cardiomyopathy; EF roughly 40% (Chronic)    It seems it is EF goes up and down based on how  he is doing from a general health perspective. He has gone down as low as 20% when he was sick and not taking medications. While on good medications is probably in the 40-45% range.  Pretty good regimen now with carvedilol, losartan, and standing dose of Lasix. Increasing Lasix dosing intervals based on concern for possible mild worsening heart failure symptoms. Potential mild volume overload. He is on too many medicines, therefore I been reluctant to add spironolactone.      Relevant Medications   furosemide (LASIX) 40 MG tablet   Other Relevant Orders   EKG 12-Lead (Completed)   Hyperlipidemia with target LDL less than 70 (Chronic)    His LDL level that better with the increased dose of atorvastatin 40 mg. Still not at goal. HDL looks pretty good. Continue current dose of Lipitor and reassess prior to follow-up.      Relevant Medications   furosemide (LASIX) 40 MG tablet   History of non-ST elevation myocardial infarction (NSTEMI) (Chronic)    History of relatively sizable infarct based on prior nuclear stress test. He has a large distribution infarct in the RCA territory consistent with known occluded RCA. No significant ischemia in that territory now. No anginal symptoms. EF is probably somewhere in the 35-40% range.  Relevant Medications   furosemide (LASIX) 40 MG tablet   Essential hypertension (Chronic)    Well-controlled on current medications. I'm not sure if he really is on ACE inhibitor plus ARB. All cases listed, but I don't think that we have prescribed that for him. It may be that he is has some mild proteinuria with his diabetes I'm not sure of.  We will need to readdress his medications.      Relevant Medications   furosemide (LASIX) 40 MG tablet   Other Relevant Orders   EKG 12-Lead (Completed)   Chronic combined systolic and diastolic congestive heart failure, NYHA class 2 (HCC) (Chronic)    Probably class II symptoms, but more limited by claudication. He is  on good regimen. I think for now though I will increase his Lasix dosing because of his concern about weight gain. We talked about sliding scale. To make it a little easier for him, and have him double up his morning dose for about the next 3 days. He will then go to taking 80 mg the morning and 40 mg in the evening on only Mondays and Fridays the rest the brachial take 40 twice a day.  Otherwise he will continue sliding-scale dosing a based on weight gain of greater than 3-5 pounds with daily weights.      Relevant Medications   furosemide (LASIX) 40 MG tablet   CAD- CABG x 6 9/08- Myoview 07/1024; Moderate Inf Infarct with very mild Peri-infarct ischemia - Primary (Chronic)    Does not sound that he has any active anginal symptoms. He is on maximal dose of beta blocker and ARB. He is also on statin now back to 40 mg.  As per previous discussions, we reviewed his Myoview results and felt that were probably better off trying to avoid invasive evaluation unless necessary. He is not currently on Imdur or Ranexa.      Relevant Medications   furosemide (LASIX) 40 MG tablet   Atherosclerosis of native arteries of extremity with intermittent claudication (HCC) (Chronic)    He is followed by vascular surgery. Pretty much no more options for revascularization. He is on Pletal now along with his other cardiac medications. He is not on Plavix but is on aspirin since he is now on Xarelto.      Relevant Medications   furosemide (LASIX) 40 MG tablet   Anticoagulation adequate, on Xarelto (Chronic)    Other Visit Diagnoses    Paroxysmal atrial fibrillation (HCC)        Relevant Medications    furosemide (LASIX) 40 MG tablet       Current medicines are reviewed at length with the patient today. (+/- concerns) what is causing the weight gain - ? Is is fluid? The following changes have been made:  TAKE FUROSEMIDE 80 MG IN THE MORNING AND 40 MG IN THE EVENING FOR 3 DAYS THEN  80 MG IN MORNING & 40  MG IN EVENING ON MONDAYS AND FRIDAYS  40 MG IN THE MORNING AND 40 MG IN THE EVENING THE OTHER DAYS   NO THERE CHANGES WITH MEDICATIONS  Your physician wants you to follow-up in 6 MONTH WITH DR Warren Kugelman.- 30 MIN  Studies Ordered:   Orders Placed This Encounter  Procedures  . EKG 12-Lead    Mr. Kriesel is a very complicated gentleman. He has multiple cardiac comorbidities. Multiple problems to be discussed. I spent close to 45 minutes with the patient, 50% of which was in counseling explaining the  various but the pathophysiology of his condition and how we are treating him., Need for exercise, heart failure symptoms and sliding scale Lasix.   Marykay Lex, M.D., M.S. Interventional Cardiologist   Pager # [phone] Phone # [phone] [address] [place], [place] [address]

## 2015-08-24 NOTE — Telephone Encounter (Signed)
Returned call to patient no answer.LMTC. 

## 2015-08-25 ENCOUNTER — Encounter: Payer: Self-pay | Admitting: Cardiology

## 2015-08-25 NOTE — Assessment & Plan Note (Signed)
Would probably need to follow-up on his MR either this year or in 1 year follow-up from now. The murmur does not sound any worse, therefore no need to check it right away.

## 2015-08-25 NOTE — Assessment & Plan Note (Signed)
He is followed by vascular surgery. Pretty much no more options for revascularization. He is on Pletal now along with his other cardiac medications. He is not on Plavix but is on aspirin since he is now on Xarelto.

## 2015-08-25 NOTE — Assessment & Plan Note (Signed)
It seems it is EF goes up and down based on how he is doing from a general health perspective. He has gone down as low as 20% when he was sick and not taking medications. While on good medications is probably in the 40-45% range.  Pretty good regimen now with carvedilol, losartan, and standing dose of Lasix. Increasing Lasix dosing intervals based on concern for possible mild worsening heart failure symptoms. Potential mild volume overload. He is on too many medicines, therefore I been reluctant to add spironolactone.

## 2015-08-25 NOTE — Assessment & Plan Note (Signed)
He is now leaks working with a Production assistant, radio for adjusting his diet. He is really concerned that gives he actually increased weight over the past month or so that he was not sure of. It may be related to change in diabetes medications but I'm not sure. He is without be fluid weight. We did increase his diuretic, but I don't think it is. Is mostly abdominal girth. In the absence of JVD, I'm not sure this is fluid weight.

## 2015-08-25 NOTE — Assessment & Plan Note (Signed)
Probably class II symptoms, but more limited by claudication. He is on good regimen. I think for now though I will increase his Lasix dosing because of his concern about weight gain. We talked about sliding scale. To make it a little easier for him, and have him double up his morning dose for about the next 3 days. He will then go to taking 80 mg the morning and 40 mg in the evening on only Mondays and Fridays the rest the brachial take 40 twice a day.  Otherwise he will continue sliding-scale dosing a based on weight gain of greater than 3-5 pounds with daily weights.

## 2015-08-25 NOTE — Assessment & Plan Note (Signed)
Well-controlled on current medications. I'm not sure if he really is on ACE inhibitor plus ARB. All cases listed, but I don't think that we have prescribed that for him. It may be that he is has some mild proteinuria with his diabetes I'm not sure of.  We will need to readdress his medications.

## 2015-08-25 NOTE — Assessment & Plan Note (Signed)
Does not sound that he has any active anginal symptoms. He is on maximal dose of beta blocker and ARB. He is also on statin now back to 40 mg.  As per previous discussions, we reviewed his Myoview results and felt that were probably better off trying to avoid invasive evaluation unless necessary. He is not currently on Imdur or Ranexa.

## 2015-08-25 NOTE — Assessment & Plan Note (Addendum)
So far so good. No further recurrences now he has been on stable medications. I think as long as his volume levels are good, he tends to stay out of A. Fib.  On carvedilol for rate control. Anticoagulated with Xarelto - no bleeding complications  This patients CHA2DS2-VASc Score and unadjusted Ischemic Stroke Rate (% per year) is equal to 4.8 % stroke rate/year from a score of 4  Above score calculated as 1 point each if present [CHF, HTN, DM, Vascular=MI/PAD/Aortic Plaque, Age if 67-74, or Male]

## 2015-08-25 NOTE — Assessment & Plan Note (Signed)
His LDL level that better with the increased dose of atorvastatin 40 mg. Still not at goal. HDL looks pretty good. Continue current dose of Lipitor and reassess prior to follow-up.

## 2015-08-25 NOTE — Assessment & Plan Note (Addendum)
History of relatively sizable infarct based on prior nuclear stress test. He has a large distribution infarct in the RCA territory consistent with known occluded RCA. No significant ischemia in that territory now. No anginal symptoms. EF is probably somewhere in the 35-40% range.

## 2015-08-26 ENCOUNTER — Encounter: Payer: Self-pay | Admitting: Endocrinology

## 2015-09-09 ENCOUNTER — Other Ambulatory Visit (INDEPENDENT_AMBULATORY_CARE_PROVIDER_SITE_OTHER): Payer: 59

## 2015-09-09 ENCOUNTER — Other Ambulatory Visit: Payer: 59

## 2015-09-09 ENCOUNTER — Other Ambulatory Visit: Payer: Self-pay | Admitting: *Deleted

## 2015-09-09 DIAGNOSIS — IMO0002 Reserved for concepts with insufficient information to code with codable children: Secondary | ICD-10-CM

## 2015-09-09 DIAGNOSIS — E1165 Type 2 diabetes mellitus with hyperglycemia: Secondary | ICD-10-CM | POA: Diagnosis not present

## 2015-09-09 DIAGNOSIS — E1140 Type 2 diabetes mellitus with diabetic neuropathy, unspecified: Secondary | ICD-10-CM

## 2015-09-09 DIAGNOSIS — E785 Hyperlipidemia, unspecified: Secondary | ICD-10-CM | POA: Diagnosis not present

## 2015-09-09 LAB — BASIC METABOLIC PANEL
BUN: 22 mg/dL (ref 6–23)
CHLORIDE: 101 meq/L (ref 96–112)
CO2: 32 meq/L (ref 19–32)
CREATININE: 1.12 mg/dL (ref 0.40–1.50)
Calcium: 9.5 mg/dL (ref 8.4–10.5)
GFR: 85.16 mL/min (ref >60.00)
Glucose, Bld: 131 mg/dL — ABNORMAL HIGH (ref 70–99)
Potassium: 4.3 mEq/L (ref 3.5–5.1)
SODIUM: 141 meq/L (ref 135–145)

## 2015-09-09 LAB — LIPID PANEL
CHOL/HDL RATIO: 4
Cholesterol: 161 mg/dL (ref 0–200)
HDL: 44.1 mg/dL (ref >39.00)
LDL CALC: 95 mg/dL (ref 0–99)
NonHDL: 116.96
TRIGLYCERIDES: 110 mg/dL (ref 0.0–149.0)
VLDL: 22 mg/dL (ref 0.0–40.0)

## 2015-09-09 LAB — HEMOGLOBIN A1C: HEMOGLOBIN A1C: 11.2 % — AB (ref 4.6–6.5)

## 2015-09-14 ENCOUNTER — Encounter: Payer: Self-pay | Admitting: Endocrinology

## 2015-09-14 ENCOUNTER — Ambulatory Visit (INDEPENDENT_AMBULATORY_CARE_PROVIDER_SITE_OTHER): Payer: 59 | Admitting: Endocrinology

## 2015-09-14 VITALS — BP 116/60 | HR 89 | Temp 98.5°F | Resp 16 | Ht 71.0 in | Wt 258.0 lb

## 2015-09-14 DIAGNOSIS — Z794 Long term (current) use of insulin: Secondary | ICD-10-CM | POA: Diagnosis not present

## 2015-09-14 DIAGNOSIS — E1165 Type 2 diabetes mellitus with hyperglycemia: Secondary | ICD-10-CM | POA: Diagnosis not present

## 2015-09-14 NOTE — Progress Notes (Signed)
Patient ID: William Pacheco, male   DOB: 07/04/1950, 65 y.o.   MRN: [id number]           Reason for Appointment: Follow-up for Type 2 Diabetes  Referring physician:  History of Present Illness:          Diagnosis: Type 2 diabetes mellitus, date of diagnosis: 1999       Previous history: He was virtually asymptomatic at the time of diagnosis even though his blood sugar at a free clinic was 550  Metformin was prescribed initially for his treatment but he does not know why this was stopped at some point He also thinks he had taken various other oral medications but no other injectable drugs before starting insulin in 2007 or so  Recent history:   INSULIN regimen is described as:  U-500 insulin with pen 35 units at 9 am, 60 units at 5 pm 40 units at 10 pm  His blood sugars have been persistently poorly controlled since at least 2012 with only occasional A1c readings below 9% He has not been seen in follow-up since his last visit in 10/16 when he was switched from NovoLog 2 units-500 insulin  He says his A1c was 12% with his PCP recently and is here for follow-up  Current management, blood sugar patterns and problems identified:  He has been taking his U-500 insulin randomly during the day and not tender relationship to his meals  He says he is trying to spread out his insulin doses  Usually taking the late evening dose after supper  He stopped taking his Toujeo partly because his insurance was not covering it  FASTING blood sugars are usually fairly good although sometimes checked late morning  He has a few blood sugars in the afternoons and these are usually high.  He has only one reading before supper which was 153  No HYPOGLYCEMIA reported   He has gained a significant amount of weight since his last visit despite continuing Invokana  He is not able to move around much because of leg pains, he has not had any steroid injections but is going to get one in his knee later this  month       Oral hypoglycemic drugs the patient is taking are: Invokana 300 mg daily      Side effects from medications have been: None    Compliance with the medical regimen: Fair Hypoglycemia: none   Glucose monitoring:  done one time a day         Glucometer: One Touch.      Blood Glucose readings by time of day   Mean values apply above for all meters except median for One Touch  PRE-MEAL Fasting 2-6 PM  Dinner Bedtime Overall  Glucose range: 90-185  180-256  153     Mean/median: 126     128     Self-care: The diet that the patient has been following is: tries to limit carbs .     Meals: 2-3 meals per day. Breakfast is sometimes eggs, lunch is occ sandwich, dinner 8-9 pm            Exercise: minimal And limited by claudication in the left leg and right knee pain        Dietician visit, most recent: 04/2014.               Weight history: 222-250  Wt Readings from Last 3 Encounters:  09/14/15 258 lb (117.028 kg)  08/23/15 248 lb (  112.492 kg)  06/03/15 245 lb (111.131 kg)    Glycemic control:   Lab Results  Component Value Date   HGBA1C 11.2* 09/09/2015   HGBA1C 10.5 04/07/2015   HGBA1C 11.4* 08/30/2014   Lab Results  Component Value Date   LDLCALC 95 09/09/2015   CREATININE 1.12 09/09/2015    Lab on 09/09/2015  Component Date Value Ref Range Status  . Hgb A1c MFr Bld 09/09/2015 11.2* 4.6 - 6.5 % Final   Glycemic Control Guidelines for People with Diabetes:Non Diabetic:  <6%Goal of Therapy: <7%Additional Action Suggested:  >8%   . Sodium 09/09/2015 141  135 - 145 mEq/L Final  . Potassium 09/09/2015 4.3  3.5 - 5.1 mEq/L Final  . Chloride 09/09/2015 101  96 - 112 mEq/L Final  . CO2 09/09/2015 32  19 - 32 mEq/L Final  . Glucose, Bld 09/09/2015 131* 70 - 99 mg/dL Final  . BUN 16/04/9603 22  6 - 23 mg/dL Final  . Creatinine, Ser 09/09/2015 1.12  0.40 - 1.50 mg/dL Final  . Calcium 54/03/8118 9.5  8.4 - 10.5 mg/dL Final  . GFR 14/78/2956 85.16  >60.00 mL/min Final    . Cholesterol 09/09/2015 161  0 - 200 mg/dL Final   ATP III Classification       Desirable:  < 200 mg/dL               Borderline High:  200 - 239 mg/dL          High:  > = 213 mg/dL  . Triglycerides 09/09/2015 110.0  0.0 - 149.0 mg/dL Final   Normal:  <086 mg/dLBorderline High:  150 - 199 mg/dL  . HDL 09/09/2015 44.10  >39.00 mg/dL Final  . VLDL 57/84/6962 22.0  0.0 - 40.0 mg/dL Final  . LDL Cholesterol 09/09/2015 95  0 - 99 mg/dL Final  . Total CHOL/HDL Ratio 09/09/2015 4   Final                  Men          Women1/2 Average Risk     3.4          3.3Average Risk          5.0          4.42X Average Risk          9.6          7.13X Average Risk          15.0          11.0                      . NonHDL 09/09/2015 116.96   Final   NOTE:  Non-HDL goal should be 30 mg/dL higher than patient's LDL goal (i.e. LDL goal of < 70 mg/dL, would have non-HDL goal of < 100 mg/dL)        Medication List       This list is accurate as of: 09/14/15  5:12 PM.  Always use your most recent med list.               aspirin 81 MG tablet  Take 81 mg by mouth daily.     atorvastatin 40 MG tablet  Commonly known as:  LIPITOR  TAKE 1 TABLET (40 MG TOTAL) BY MOUTH DAILY.     CALCIUM 1000 + D PO  Take 2 tablets by mouth daily.     calcium carbonate  600 MG Tabs tablet  Commonly known as:  OS-CAL  Take 600 mg by mouth 2 (two) times daily with a meal. Take two tablets daily     carvedilol 25 MG tablet  Commonly known as:  COREG  Take 37.5 mg by mouth 2 (two) times daily with a meal. (1 and 1/2 tablets)     cilostazol 50 MG tablet  Commonly known as:  PLETAL  TAKE 1 TABLET (50 MG TOTAL) BY MOUTH 2 (TWO) TIMES DAILY.     furosemide 40 MG tablet  Commonly known as:  LASIX  TAKE 80 MG IN AM ,40 MG IN PM ON MONDAYS AND FRIDAYS,OTHER DAYS TAKE 40 MG TWICE A DAY     GLUCOSAMINE CHONDR 1500 COMPLX PO  Take 1,500 mg by mouth daily. Reported on 09/14/2015     insulin regular human CONCENTRATED 500  UNIT/ML kwikpen  Commonly known as:  HUMULIN R U-500 KWIKPEN  Inject 25 units at breakfast, 60 units at lunch and 40 units at breakfast     INVOKANA 300 MG Tabs tablet  Generic drug:  canagliflozin  TAKE 1 TABLET BY MOUTH DAILY BEFORE BREAKFAST     KLOR-CON M10 10 MEQ tablet  Generic drug:  potassium chloride     losartan 100 MG tablet  Commonly known as:  COZAAR  Take 100 mg by mouth daily.     Magnesium Oxide 500 MG Caps  Take 1,000 mg by mouth 2 (two) times daily.     metFORMIN 500 MG 24 hr tablet  Commonly known as:  GLUCOPHAGE-XR  TAKE (3) TABLETS BY MOUTH DAILY     mupirocin ointment 2 %  Commonly known as:  BACTROBAN     omega-3 acid ethyl esters 1 g capsule  Commonly known as:  LOVAZA  Take 1,200 g by mouth 3 (three) times daily.     PATADAY 0.2 % Soln  Generic drug:  Olopatadine HCl  Reported on 09/14/2015     pregabalin 100 MG capsule  Commonly known as:  LYRICA  Take 300 mg by mouth 3 (three) times daily.     ramipril 10 MG capsule  Commonly known as:  ALTACE     rivaroxaban 20 MG Tabs tablet  Commonly known as:  XARELTO  Take 1 tablet (20 mg total) by mouth daily with supper.     traMADol 50 MG tablet  Commonly known as:  ULTRAM  Take 1 tablet (50 mg total) by mouth every 6 (six) hours as needed.     Vitamin B-12 2000 MCG Tbcr  Take 3 tablets by mouth daily.     vitamin C 500 MG tablet  Commonly known as:  ASCORBIC ACID  Take 1,500 mg by mouth daily.     Vitamin D (Cholecalciferol) 1000 units Tabs  Take 3,000 Units by mouth daily.     vitamin E 400 UNIT capsule  Take 1,200 Units by mouth daily.        Allergies: No Known Allergies  Past Medical History  Diagnosis Date  . CAD (coronary artery disease), native coronary artery September 2008    Left main 50-60%, LAD 80% with D2 involved. RCA mid occlusion --> CABG  . 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 Ischemic cardiomyopathy September 2008      EF = 35-40%; THE 45-50% IN 2013 --> ECHO 03/03/13 - EF 40-45% LV: Moderate HK of basal-midlateral & inferior myocardium; c/w prior infarction in the  distribution of the RCA or LCx. Mild-Mod MR with Calcified annulus. Mod TR  . History of MI (myocardial infarction) 2012    Noted by echocardiography, and nuclear stress testing. Known RCA occlusion prior to CABG  . PAF (paroxysmal atrial fibrillation) - initial presentation with RVR, and heart failure 2013    On a beta blocker, calcium channel blocker & Xarelto. Lexiscan Myoview 11/08/11 LV was enlarged with no evidence of ischemia. There was a moderate sized, moderate to severe in intensity fixed defect in the inferior wall suggestive of scar with no reversible ischemia. There was a basal septal akinesis w/severe global hypokinesis. EF = 31% (Which is different from the [facility] EF)  . Chronic diastolic CHF (congestive heart failure), NYHA class 2 (HCC) 2009, 2011; 2013    Exacerbated by A. fib RVR, currently on Lasix.  . Peripheral arterial disease (HCC) 2009, 2011; 2013    2009, 2011; 2013  . Renal artery stenosis, non-flow-limiting (HCC)   . DM (diabetes mellitus), type 2 with neurological complications (HCC)     Peripheral neuropathy  . Diabetic peripheral neuropathy associated with type 2 diabetes mellitus (HCC)   . Hyperlipidemia LDL goal <70     On statin therapy  . Obesity (BMI 30.0-34.9)   . Hypertension associated with diabetes (HCC)   . Erectile dysfunction associated with type 2 diabetes mellitus (HCC)  2006  . Ischemic mitral regurgitation  1980s    Mild to moderate  . Thrombocytopenia (HCC)      chronic  . Osteoarthritis of both knees 1990    Status post left knee a arthroscopy, currently Harding right knee  . History of hepatitis B 1980s  . Adopted     Adopted [Z61.09]    Past Surgical History  Procedure Laterality Date  . Femoral endarterectomy  03-15-2008    Left EIA & CFA endarterectomy by Dr. Madilyn Fireman  . Knee arthroscopy  Left 1990    For osteoarthritis pain.  . Tonsillectomy    . Pleural effusion drainage Right   . Coronary artery bypass graft  03/2007    CABG x 6 LIMA-LAD, left radial to diagonal 1, SVG to OM1-OM2 sequential, SVG to PDA-PLA sequential.  . Transthoracic echocardiogram  November 2014    EF 40-45%. Moderate hypokinesis of the basal and mid lateral and inferior myocardium. Mild to moderate MR. Mild LA dilation.  . Transthoracic echocardiogram  07/26/2013    In setting of acute illness: EF 15-20%, atrial fibrillation; diffuse hypokinesis with inferolateral abnormalities consistent with ischemic heart disease; grade 3 diastolic dysfunction/restrictive physiology - elevated LVEDP/LAP..  . Transthoracic echocardiogram  09/09/2013    40%. Septal hypokinesis. Moderate to severely dilated LV. Mild LVH and diffuse hypokinesis. Mild to moderate MR.  Marland Kitchen Nm myoview ltd  08/13/2013    Moderate LV dilation. Abnormal study with all high-risk features, unchanged from October 2014. Inferior defect likely representing nontransmural scar. Suggesting nonischemic cardiopathy superimposed on known ischemic cardiomyopathy.; Severe global hypokinesis with EF of roughly 31%.  . Vascular surgery Bilateral 2006    pt reports about 4 vascular surgeries starting in 2006 and last was in 2013.  Marland Kitchen Patch angioplasty Left 09/16/2013    Procedure: PATCH ANGIOPLASTY OF LEFT BELOW THE KNEE POPLITEAL ARTERY;  Surgeon: Fransisco Hertz, MD;  Location: [facility] OR;  Service: Vascular;  Laterality: Left;  . Intraoperative arteriogram Left 09/16/2013    Procedure: INTRA OPERATIVE ARTERIOGRAM;  Surgeon: Fransisco Hertz, MD;  Location: [facility] OR;  Service: Vascular;  Laterality: Left;  .  Groin dissection Left 09/16/2013    Procedure: GROIN EXPLORATION;  Surgeon: Fransisco Hertz, MD;  Location: [facility] OR;  Service: Vascular;  Laterality: Left;  . Endarterectomy popliteal Left 09/16/2013    Procedure: ENDARTERECTOMYOF LEFT POPLITEAL ARTERY;  Surgeon: Fransisco Hertz, MD;   Location: [facility] OR;  Service: Vascular;  Laterality: Left;  . Abdominal aortagram N/A 08/30/2011    Procedure: ABDOMINAL Ronny Flurry;  Surgeon: Fransisco Hertz, MD;  Location: [facility] CATH LAB;  Service: Cardiovascular;  Laterality: N/A;  . Percutaneous stent intervention Left 08/30/2011    Procedure: PERCUTANEOUS STENT INTERVENTION;  Surgeon: Fransisco Hertz, MD;  Location: [facility] CATH LAB;  Service: Cardiovascular;  Laterality: Left;  . Lower extremity angiogram Left 09/07/2013    Procedure: LOWER EXTREMITY ANGIOGRAM;  Surgeon: Fransisco Hertz, MD;  Location: [facility] CATH LAB;  Service: Cardiovascular;  Laterality: Left;  . Nm myoview ltd  07/22/2014    EF ~35%. High-Risk study do to moderate size, medial intensity partially reversible defect consistent with prior infave High-Risk do to reduced LV function; inferior akinesia and global hypokinesis   . Abis  December 2016    Are ABI 0.78 = moderate arterial occlusive disease; L ABI 0.51 = moderate-severe occlusive arterial disease    Family History  Problem Relation Age of Onset  . Adopted: Yes  . Cancer Mother     Social History:  reports that he quit smoking about 10 years ago. His smoking use included Cigarettes. He started smoking about 42 years ago. He smoked 0.50 packs per day. He has never used smokeless tobacco. He reports that he does not drink alcohol or use illicit drugs.    Review of Systems         Most recent eye exam was 2015        Lipids: On Lipitor 40mg        Lab Results  Component Value Date   CHOL 161 09/09/2015   HDL 44.10 09/09/2015   LDLCALC 95 09/09/2015   TRIG 110.0 09/09/2015   CHOLHDL 4 09/09/2015                  The blood pressure has been controlled with Losartan, Ramipril 10 mg and Carvedilol, followed by cardiologist  Complications: Peripheral neuropathy, cardiovascular disease, peripheral vascular disease    Calf pain on walking is present on the left side and not benefiting from cilostazol   Last diabetic foot exam in  3/16 showed slightly decreased monofilament sensation and absent pulses  LABS:  Lab on 09/09/2015  Component Date Value Ref Range Status  . Hgb A1c MFr Bld 09/09/2015 11.2* 4.6 - 6.5 % Final   Glycemic Control Guidelines for People with Diabetes:Non Diabetic:  <6%Goal of Therapy: <7%Additional Action Suggested:  >8%   . Sodium 09/09/2015 141  135 - 145 mEq/L Final  . Potassium 09/09/2015 4.3  3.5 - 5.1 mEq/L Final  . Chloride 09/09/2015 101  96 - 112 mEq/L Final  . CO2 09/09/2015 32  19 - 32 mEq/L Final  . Glucose, Bld 09/09/2015 131* 70 - 99 mg/dL Final  . BUN 97/08/6376 22  6 - 23 mg/dL Final  . Creatinine, Ser 09/09/2015 1.12  0.40 - 1.50 mg/dL Final  . Calcium 58/85/0277 9.5  8.4 - 10.5 mg/dL Final  . GFR 41/28/7867 85.16  >60.00 mL/min Final  . Cholesterol 09/09/2015 161  0 - 200 mg/dL Final   ATP III Classification       Desirable:  < 200 mg/dL  Borderline High:  200 - 239 mg/dL          High:  > = 829 mg/dL  . Triglycerides 09/09/2015 110.0  0.0 - 149.0 mg/dL Final   Normal:  <562 mg/dLBorderline High:  150 - 199 mg/dL  . HDL 09/09/2015 44.10  >39.00 mg/dL Final  . VLDL 13/01/6577 22.0  0.0 - 40.0 mg/dL Final  . LDL Cholesterol 09/09/2015 95  0 - 99 mg/dL Final  . Total CHOL/HDL Ratio 09/09/2015 4   Final                  Men          Women1/2 Average Risk     3.4          3.3Average Risk          5.0          4.42X Average Risk          9.6          7.13X Average Risk          15.0          11.0                      . NonHDL 09/09/2015 116.96   Final   NOTE:  Non-HDL goal should be 30 mg/dL higher than patient's LDL goal (i.e. LDL goal of < 70 mg/dL, would have non-HDL goal of < 100 mg/dL)    Physical Examination:  BP 116/60 mmHg  Pulse 89  Temp(Src) 98.5 F (36.9 C)  Resp 16  Ht 5\' 11"  (1.803 m)  Wt 258 lb (117.028 kg)  BMI 36.00 kg/m2  SpO2 98%             .        ASSESSMENT:  Diabetes type 2, uncontrolled with insulin resistance and moderate  obesity.   See history of present illness for detailed discussion of current diabetes management, blood sugar patterns and problems identified  His A1c has been persistently high and he has been noncompliant with his follow-up as before Surprisingly with his fasting blood sugars being near normal his A1c is markedly increased and may be related to postprandial hyperglycemia A1c still over 10% Fasting blood sugars are controlled with taking U-500 at 10 PM He does need to be consistent with diet Discussed importance of taking his mealtime insulin 30 minutes before eating rather than randomly during the day  HYPERLIPIDEMIA:  controlled with 40 mg Lipitor alone and needs follow-up levels every 6 months   PLAN:   Start taking U-500 insulin on waking up in the morning along with 30 minutes before lunch and dinner  Continuous glucose monitoring to identify blood sugar patterns especially postprandial and overnight since he is not doing any significant postprandial monitoring.  Discussed benefits of doing this  He will also need to check fructosamine to confirm that his blood sugars are truly high overall  No change Lipitor  Follow-up in 2 weeks after getting the continuous glucose monitoring analyzed  He will keep a food diary when doing the continuous glucose monitoring   Patient Instructions  45 units on waking up, 60 units before lunch;  pm 40 units before supper  Take 30 min before meals     Counseling time on subjects discussed above is over 50% of today's 25 minute visit  William Pacheco 09/14/2015, 5:12 PM   Note: This office note was prepared with Dragon voice  recognition system technology. Any transcriptional errors that result from this process are unintentional.

## 2015-09-14 NOTE — Patient Instructions (Signed)
45 units on waking up, 60 units before lunch;  pm 40 units before supper  Take 30 min before meals

## 2015-09-16 ENCOUNTER — Telehealth: Payer: Self-pay | Admitting: Endocrinology

## 2015-09-22 LAB — POCT GLUCOSE (DEVICE FOR HOME USE)

## 2015-09-27 ENCOUNTER — Ambulatory Visit (INDEPENDENT_AMBULATORY_CARE_PROVIDER_SITE_OTHER): Payer: 59 | Admitting: Podiatry

## 2015-09-27 ENCOUNTER — Encounter: Payer: Self-pay | Admitting: Podiatry

## 2015-09-27 DIAGNOSIS — L84 Corns and callosities: Secondary | ICD-10-CM

## 2015-09-27 DIAGNOSIS — E1159 Type 2 diabetes mellitus with other circulatory complications: Secondary | ICD-10-CM | POA: Diagnosis not present

## 2015-09-27 DIAGNOSIS — M79673 Pain in unspecified foot: Secondary | ICD-10-CM

## 2015-09-27 DIAGNOSIS — B351 Tinea unguium: Secondary | ICD-10-CM | POA: Diagnosis not present

## 2015-09-27 DIAGNOSIS — Q828 Other specified congenital malformations of skin: Secondary | ICD-10-CM

## 2015-09-27 NOTE — Progress Notes (Signed)
Patient ID: William Pacheco, male   DOB: 06/23/1951, 65 y.o.   MRN: [id number] Complaint:  Visit Type: Patient returns to my office for continued preventative foot care services. Complaint: Patient states" my nails have grown long and thick and become painful to walk and wear shoes" Patient has been diagnosed with DM with neuropathy. The patient presents for preventative foot care services. No changes to ROS.  Severe dry feet.  Podiatric Exam: Vascular: dorsalis pedis and posterior tibial pulses are palpable bilateral. Capillary return is immediate. Temperature gradient is WNL. Skin turgor WNL  Sensorium: Normal Semmes Weinstein monofilament test right foot.  Absent LOPS left foot.. Normal tactile sensation bilaterally. Nail Exam: Pt has thick disfigured discolored nails with subungual debris noted bilateral entire nail hallux through fifth toenails Ulcer Exam: There is no evidence of ulcer or pre-ulcerative changes or infection. Orthopedic Exam: Muscle tone and strength are WNL. No limitations in general ROM. No crepitus or effusions noted. Foot type and digits show no abnormalities. Bony prominences are unremarkable. Skin: No Porokeratosis. No infection or ulcers.  Severe dry skin noted.  Diagnosis:  Onychomycosis, , Pain in right toe, pain in left toes  Treatment & Plan Procedures and Treatment: Consent by patient was obtained for treatment procedures. The patient understood the discussion of treatment and procedures well. All questions were answered thoroughly reviewed. Debridement of mycotic and hypertrophic toenails, 1 through 5 bilateral and clearing of subungual debris. No ulceration, no infection noted.  Return Visit-Office Procedure: Patient instructed to return to the office for a follow up visit 10 weeks  for continued evaluation and treatment.    Helane Gunther DPM

## 2015-09-28 ENCOUNTER — Ambulatory Visit: Payer: 59 | Admitting: Endocrinology

## 2015-10-04 ENCOUNTER — Encounter: Payer: Self-pay | Admitting: Endocrinology

## 2015-10-04 ENCOUNTER — Ambulatory Visit (INDEPENDENT_AMBULATORY_CARE_PROVIDER_SITE_OTHER): Payer: 59 | Admitting: Endocrinology

## 2015-10-04 VITALS — BP 112/52 | HR 80 | Temp 98.8°F | Resp 14 | Ht 71.0 in | Wt 260.0 lb

## 2015-10-04 DIAGNOSIS — E1165 Type 2 diabetes mellitus with hyperglycemia: Secondary | ICD-10-CM

## 2015-10-04 DIAGNOSIS — Z794 Long term (current) use of insulin: Secondary | ICD-10-CM | POA: Diagnosis not present

## 2015-10-04 NOTE — Progress Notes (Signed)
Patient ID: William Pacheco, male   DOB: 1951-06-05, 65 y.o.   MRN: [id number]           Reason for Appointment: Follow-up for Type 2 Diabetes  Referring physician:  History of Present Illness:          Diagnosis: Type 2 diabetes mellitus, date of diagnosis: 1999       Previous history: He was virtually asymptomatic at the time of diagnosis even though his blood sugar at a free clinic was 550  Metformin was prescribed initially for his treatment but he does not know why this was stopped at some point He also thinks he had taken various other oral medications but no other injectable drugs before starting insulin in 2007 or so  Recent history:   INSULIN regimen is described as:  U-500 insulin with pen 45 units on waking up, 60 units 4; pm 40 units at 10 pm  His blood sugars have been persistently poorly controlled since at least 2012 with only occasional A1c readings below 9% He has been on U-500 insulin for several months with the last A1c of 11.2 in 3/17  Because of difficulty getting an idea of his blood sugar patterns he was given the continuous glucose monitoring sensor but this was valid only for 72 hours as it fell off   Current management, blood sugar patterns and problems identified:  He has been taking his U-500 insulin again randomly during the day despite specific instructions about taking it before his meals on his last visit.  He still thinks he needs to spread out his insulin doses and also take his last dose at bedtime to control fasting readings  As discussed below his blood sugars are on his continuous glucose monitoring mostly within the target range except higher on one afternoon  He tends to have low normal sugars before waking up and once before supper also  He is again checking only fasting blood sugars at home and not doing postprandial readings as directed   He had gained a significant amount of weight since his last visit despite continuing Invokana  He is  not able to move around much because of leg pains, he has not had any steroid injections but is going to get one in his knee later this month       Oral hypoglycemic drugs the patient is taking are: Invokana 300 mg daily      Side effects from medications have been: None    Compliance with the medical regimen: Fair Hypoglycemia: none   CONTINUOUS GLUCOSE MONITORING RECORD INTERPRETATION      Sensor  summary:  His blood sugar recording was only available for 72 hours starting on 3/20 and lasting until midday of 3/23 because of the sensor coming off Average blood glucose for the period of recording is  105.  Lowest average blood sugar is at 6- a.m. Until 8 AM with average of 66 .  HIGHEST blood sugar average was that 2-4 PM, reading 141 Blood sugars were in target range between 72-82% of the time, high only transiently on 3/20 in the afternoon otherwise not in the high range     Glycemic patterns:  Hyperglycemic episodes  Occurred after about 1 PM on 3/20  Hypoglycemic episodes occurred On 3/21 at about 8 PM and minimally also on 3/22     Overnight periods:   blood sugars were about 125 at midnight but after 2 AM gradually declined to low normal levels  Until  8 AM     Preprandial periods:  Blood sugars are low normal on waking up and slightly higher between 8-10 AM when he is drinking coffee.  Blood sugars are within target range at lunch and dinner but on 3/21 blood sugar was low before supper      Postprandial periods:  Blood sugars are rising 8-10 AM although not over 150.  Blood sugar was above normal only on the first day between about 1-5 PM rising to over 200.  Otherwise glucose was not high after evening meal      Hypoglycemia: Significant only on 3/21 before supper but blood sugars were low normal around 6 AM at least on one day       Glucose monitoring:  done one time a day         Glucometer: One Touch.      Blood Glucose readings by time of day From download:  Mean values  apply above for all meters except median for One Touch  PRE-MEAL Fasting Lunch Dinner Bedtime Overall  Glucose range: 85-180  136  183     Mean/median: 120     124     Self-care: The diet that the patient has been following is: tries to limit carbs .     Meals: 2-3 meals per day. Breakfast is sometimes eggs, lunch is occ sandwich, dinner 8-9 pm            Exercise: minimal And limited by claudication in the left leg and right knee pain        Dietician visit, most recent: 04/2014.               Weight history: 222-250  Wt Readings from Last 3 Encounters:  10/04/15 260 lb (117.935 kg)  09/14/15 258 lb (117.028 kg)  08/23/15 248 lb (112.492 kg)    Glycemic control:   Lab Results  Component Value Date   HGBA1C 11.2* 09/09/2015   HGBA1C 10.5 04/07/2015   HGBA1C 11.4* 08/30/2014   Lab Results  Component Value Date   LDLCALC 95 09/09/2015   CREATININE 1.12 09/09/2015    No visits with results within 1 Week(s) from this visit. Latest known visit with results is:  Lab on 09/09/2015  Component Date Value Ref Range Status  . Hgb A1c MFr Bld 09/09/2015 11.2* 4.6 - 6.5 % Final   Glycemic Control Guidelines for People with Diabetes:Non Diabetic:  <6%Goal of Therapy: <7%Additional Action Suggested:  >8%   . Sodium 09/09/2015 141  135 - 145 mEq/L Final  . Potassium 09/09/2015 4.3  3.5 - 5.1 mEq/L Final  . Chloride 09/09/2015 101  96 - 112 mEq/L Final  . CO2 09/09/2015 32  19 - 32 mEq/L Final  . Glucose, Bld 09/09/2015 131* 70 - 99 mg/dL Final  . BUN 16/04/9603 22  6 - 23 mg/dL Final  . Creatinine, Ser 09/09/2015 1.12  0.40 - 1.50 mg/dL Final  . Calcium 54/03/8118 9.5  8.4 - 10.5 mg/dL Final  . GFR 14/78/2956 85.16  >60.00 mL/min Final  . Cholesterol 09/09/2015 161  0 - 200 mg/dL Final   ATP III Classification       Desirable:  < 200 mg/dL               Borderline High:  200 - 239 mg/dL          High:  > = 213 mg/dL  . Triglycerides 09/09/2015 110.0  0.0 - 149.0 mg/dL Final  Normal:  <150 mg/dLBorderline High:  150 - 199 mg/dL  . HDL 09/09/2015 44.10  >39.00 mg/dL Final  . VLDL 62/13/0865 22.0  0.0 - 40.0 mg/dL Final  . LDL Cholesterol 09/09/2015 95  0 - 99 mg/dL Final  . Total CHOL/HDL Ratio 09/09/2015 4   Final                  Men          Women1/2 Average Risk     3.4          3.3Average Risk          5.0          4.42X Average Risk          9.6          7.13X Average Risk          15.0          11.0                      . NonHDL 09/09/2015 116.96   Final   NOTE:  Non-HDL goal should be 30 mg/dL higher than patient's LDL goal (i.e. LDL goal of < 70 mg/dL, would have non-HDL goal of < 100 mg/dL)        Medication List       This list is accurate as of: 10/04/15  4:38 PM.  Always use your most recent med list.               aspirin 81 MG tablet  Take 81 mg by mouth daily.     atorvastatin 40 MG tablet  Commonly known as:  LIPITOR  TAKE 1 TABLET (40 MG TOTAL) BY MOUTH DAILY.     CALCIUM 1000 + D PO  Take 2 tablets by mouth daily.     calcium carbonate 600 MG Tabs tablet  Commonly known as:  OS-CAL  Take 600 mg by mouth 2 (two) times daily with a meal. Take two tablets daily     carvedilol 25 MG tablet  Commonly known as:  COREG  Take 37.5 mg by mouth 2 (two) times daily with a meal. (1 and 1/2 tablets)     cilostazol 50 MG tablet  Commonly known as:  PLETAL  TAKE 1 TABLET (50 MG TOTAL) BY MOUTH 2 (TWO) TIMES DAILY.     furosemide 40 MG tablet  Commonly known as:  LASIX  TAKE 80 MG IN AM ,40 MG IN PM ON MONDAYS AND FRIDAYS,OTHER DAYS TAKE 40 MG TWICE A DAY     GLUCOSAMINE CHONDR 1500 COMPLX PO  Take 1,500 mg by mouth daily. Reported on 09/14/2015     insulin regular human CONCENTRATED 500 UNIT/ML kwikpen  Commonly known as:  HUMULIN R U-500 KWIKPEN  Inject 25 units at breakfast, 60 units at lunch and 40 units at breakfast     INVOKANA 300 MG Tabs tablet  Generic drug:  canagliflozin  TAKE 1 TABLET BY MOUTH DAILY BEFORE BREAKFAST       KLOR-CON M10 10 MEQ tablet  Generic drug:  potassium chloride     losartan 100 MG tablet  Commonly known as:  COZAAR  Take 100 mg by mouth daily.     Magnesium Oxide 500 MG Caps  Take 1,000 mg by mouth 2 (two) times daily.     metFORMIN 500 MG 24 hr tablet  Commonly known as:  GLUCOPHAGE-XR  TAKE (3) TABLETS BY MOUTH  DAILY     mupirocin ointment 2 %  Commonly known as:  BACTROBAN     omega-3 acid ethyl esters 1 g capsule  Commonly known as:  LOVAZA  Take 1,200 g by mouth 3 (three) times daily.     PATADAY 0.2 % Soln  Generic drug:  Olopatadine HCl  Reported on 09/14/2015     pregabalin 100 MG capsule  Commonly known as:  LYRICA  Take 300 mg by mouth 3 (three) times daily.     ramipril 10 MG capsule  Commonly known as:  ALTACE     rivaroxaban 20 MG Tabs tablet  Commonly known as:  XARELTO  Take 1 tablet (20 mg total) by mouth daily with supper.     traMADol 50 MG tablet  Commonly known as:  ULTRAM  Take 1 tablet (50 mg total) by mouth every 6 (six) hours as needed.     Vitamin B-12 2000 MCG Tbcr  Take 3 tablets by mouth daily.     vitamin C 500 MG tablet  Commonly known as:  ASCORBIC ACID  Take 1,500 mg by mouth daily.     Vitamin D (Cholecalciferol) 1000 units Tabs  Take 3,000 Units by mouth daily.     vitamin E 400 UNIT capsule  Take 1,200 Units by mouth daily.        Allergies: No Known Allergies  Past Medical History  Diagnosis Date  . CAD (coronary artery disease), native coronary artery September 2008    Left main 50-60%, LAD 80% with D2 involved. RCA mid occlusion --> CABG  . 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 Ischemic cardiomyopathy September 2008    EF = 35-40%; THE 45-50% IN 2013 --> ECHO 03/03/13 - EF 40-45% LV: Moderate HK of basal-midlateral & inferior myocardium; c/w prior infarction in the distribution of the RCA or LCx. Mild-Mod MR with Calcified annulus. Mod TR  . History of MI (myocardial  infarction) 2012    Noted by echocardiography, and nuclear stress testing. Known RCA occlusion prior to CABG  . PAF (paroxysmal atrial fibrillation) - initial presentation with RVR, and heart failure 2013    On a beta blocker, calcium channel blocker & Xarelto. Lexiscan Myoview 11/08/11 LV was enlarged with no evidence of ischemia. There was a moderate sized, moderate to severe in intensity fixed defect in the inferior wall suggestive of scar with no reversible ischemia. There was a basal septal akinesis w/severe global hypokinesis. EF = 31% (Which is different from the [facility] EF)  . Chronic diastolic CHF (congestive heart failure), NYHA class 2 (HCC) 2009, 2011; 2013    Exacerbated by A. fib RVR, currently on Lasix.  . Peripheral arterial disease (HCC) 2009, 2011; 2013    2009, 2011; 2013  . Renal artery stenosis, non-flow-limiting (HCC)   . DM (diabetes mellitus), type 2 with neurological complications (HCC)     Peripheral neuropathy  . Diabetic peripheral neuropathy associated with type 2 diabetes mellitus (HCC)   . Hyperlipidemia LDL goal <70     On statin therapy  . Obesity (BMI 30.0-34.9)   . Hypertension associated with diabetes (HCC)   . Erectile dysfunction associated with type 2 diabetes mellitus (HCC)  2006  . Ischemic mitral regurgitation  1980s    Mild to moderate  . Thrombocytopenia (HCC)      chronic  . Osteoarthritis of both knees 1990    Status post left knee a arthroscopy, currently  Harding right knee  . History of hepatitis B 1980s  . Adopted     Adopted [O96.29]    Past Surgical History  Procedure Laterality Date  . Femoral endarterectomy  03-15-2008    Left EIA & CFA endarterectomy by Dr. Madilyn Fireman  . Knee arthroscopy Left 1990    For osteoarthritis pain.  . Tonsillectomy    . Pleural effusion drainage Right   . Coronary artery bypass graft  03/2007    CABG x 6 LIMA-LAD, left radial to diagonal 1, SVG to OM1-OM2 sequential, SVG to PDA-PLA sequential.  .  Transthoracic echocardiogram  November 2014    EF 40-45%. Moderate hypokinesis of the basal and mid lateral and inferior myocardium. Mild to moderate MR. Mild LA dilation.  . Transthoracic echocardiogram  07/26/2013    In setting of acute illness: EF 15-20%, atrial fibrillation; diffuse hypokinesis with inferolateral abnormalities consistent with ischemic heart disease; grade 3 diastolic dysfunction/restrictive physiology - elevated LVEDP/LAP..  . Transthoracic echocardiogram  09/09/2013    40%. Septal hypokinesis. Moderate to severely dilated LV. Mild LVH and diffuse hypokinesis. Mild to moderate MR.  Marland Kitchen Nm myoview ltd  08/13/2013    Moderate LV dilation. Abnormal study with all high-risk features, unchanged from October 2014. Inferior defect likely representing nontransmural scar. Suggesting nonischemic cardiopathy superimposed on known ischemic cardiomyopathy.; Severe global hypokinesis with EF of roughly 31%.  . Vascular surgery Bilateral 2006    pt reports about 4 vascular surgeries starting in 2006 and last was in 2013.  Marland Kitchen Patch angioplasty Left 09/16/2013    Procedure: PATCH ANGIOPLASTY OF LEFT BELOW THE KNEE POPLITEAL ARTERY;  Surgeon: Fransisco Hertz, MD;  Location[facility] OR;  Service: Vascular;  Laterality: Left;  . Intraoperative arteriogram Left 09/16/2013    Procedure: INTRA OPERATIVE ARTERIOGRAM;  Surgeon: Fransisco Hertz, MD;  Location: [facility] OR;  Service: Vascular;  Laterality: Left;  . Groin dissection Left 09/16/2013    Procedure: GROIN EXPLORATION;  Surgeon: Fransisco Hertz, MD;  Location: [facility] OR;  Service: Vascular;  Laterality: Left;  . Endarterectomy popliteal Left 09/16/2013    Procedure: ENDARTERECTOMYOF LEFT POPLITEAL ARTERY;  Surgeon: Fransisco Hertz, MD;  Location: [facility] OR;  Service: Vascular;  Laterality: Left;  . Abdominal aortagram N/A 08/30/2011    Procedure: ABDOMINAL Ronny Flurry;  Surgeon: Fransisco Hertz, MD;  Location: [facility] CATH LAB;  Service: Cardiovascular;  Laterality: N/A;  . Percutaneous  stent intervention Left 08/30/2011    Procedure: PERCUTANEOUS STENT INTERVENTION;  Surgeon: Fransisco Hertz, MD;  Location: [facility] CATH LAB;  Service: Cardiovascular;  Laterality: Left;  . Lower extremity angiogram Left 09/07/2013    Procedure: LOWER EXTREMITY ANGIOGRAM;  Surgeon: Fransisco Hertz, MD;  Location: [facility] CATH LAB;  Service: Cardiovascular;  Laterality: Left;  . Nm myoview ltd  07/22/2014    EF ~35%. High-Risk study do to moderate size, medial intensity partially reversible defect consistent with prior infave High-Risk do to reduced LV function; inferior akinesia and global hypokinesis   . Abis  December 2016    Are ABI 0.78 = moderate arterial occlusive disease; L ABI 0.51 = moderate-severe occlusive arterial disease    Family History  Problem Relation Age of Onset  . Adopted: Yes  . Cancer Mother     Social History:  reports that he quit smoking about 10 years ago. His smoking use included Cigarettes. He started smoking about 42 years ago. He smoked 0.50 packs per day. He has never used smokeless tobacco. He reports that he  does not drink alcohol or use illicit drugs.    Review of Systems         Most recent eye exam was 2015        Lipids: On Lipitor 40mg  with good control       Lab Results  Component Value Date   CHOL 161 09/09/2015   HDL 44.10 09/09/2015   LDLCALC 95 09/09/2015   TRIG 110.0 09/09/2015   CHOLHDL 4 09/09/2015                  The blood pressure has been controlled with Losartan, Ramipril 10 mg and Carvedilol, followed by cardiologist  Complications: Peripheral neuropathy, cardiovascular disease, peripheral vascular disease    Calf pain on walking is present on the left side   Last diabetic foot exam in 3/16 showed slightly decreased monofilament sensation and absent pulses  LABS:  No visits with results within 1 Week(s) from this visit. Latest known visit with results is:  Lab on 09/09/2015  Component Date Value Ref Range Status  . Hgb A1c MFr Bld  09/09/2015 11.2* 4.6 - 6.5 % Final   Glycemic Control Guidelines for People with Diabetes:Non Diabetic:  <6%Goal of Therapy: <7%Additional Action Suggested:  >8%   . Sodium 09/09/2015 141  135 - 145 mEq/L Final  . Potassium 09/09/2015 4.3  3.5 - 5.1 mEq/L Final  . Chloride 09/09/2015 101  96 - 112 mEq/L Final  . CO2 09/09/2015 32  19 - 32 mEq/L Final  . Glucose, Bld 09/09/2015 131* 70 - 99 mg/dL Final  . BUN 91/47/8295 22  6 - 23 mg/dL Final  . Creatinine, Ser 09/09/2015 1.12  0.40 - 1.50 mg/dL Final  . Calcium 62/13/0865 9.5  8.4 - 10.5 mg/dL Final  . GFR 78/46/9629 85.16  >60.00 mL/min Final  . Cholesterol 09/09/2015 161  0 - 200 mg/dL Final   ATP III Classification       Desirable:  < 200 mg/dL               Borderline High:  200 - 239 mg/dL          High:  > = 528 mg/dL  . Triglycerides 09/09/2015 110.0  0.0 - 149.0 mg/dL Final   Normal:  <413 mg/dLBorderline High:  150 - 199 mg/dL  . HDL 09/09/2015 44.10  >39.00 mg/dL Final  . VLDL 24/40/1027 22.0  0.0 - 40.0 mg/dL Final  . LDL Cholesterol 09/09/2015 95  0 - 99 mg/dL Final  . Total CHOL/HDL Ratio 09/09/2015 4   Final                  Men          Women1/2 Average Risk     3.4          3.3Average Risk          5.0          4.42X Average Risk          9.6          7.13X Average Risk          15.0          11.0                      . NonHDL 09/09/2015 116.96   Final   NOTE:  Non-HDL goal should be 30 mg/dL higher than patient's LDL goal (i.e. LDL goal of < 70 mg/dL,  would have non-HDL goal of < 100 mg/dL)    Physical Examination:  BP 112/52 mmHg  Pulse 80  Temp(Src) 98.8 F (37.1 C) (Oral)  Resp 14  Ht 5\' 11"  (1.803 m)  Wt 260 lb (117.935 kg)  BMI 36.28 kg/m2  SpO2 94%             .        ASSESSMENT:  Diabetes type 2, uncontrolled with insulin resistance and moderate obesity.   See history of present illness for detailed discussion of current diabetes management, blood sugar patterns and problems identified  Even though  his A1c was very high last month recent evaluation of his blood sugars especially with the continuous glucose monitoring does not show fairly high readings.  This was discussed with patient  He is doing fairly well with combination of Invokana and U-500 insulin His main difficulties not following instructions for the timing of his insulin Discussed again in detail the need for using this insulin for mealtime coverage Explained in detail that since his readings are low normal  early morning he can take his evening dose before supper rather than bedtime He does need to work on his diet and exercise for weight loss   PLAN:   Start taking U-500 insulin on waking up in the morning along with 30 minutes before lunch and dinner  Will reduce his dose at suppertime by 5 units for usual meals  He can adjust the dose at lunchtime based on his meal size  Start rotating the timing of his blood sugar monitoring including after meals also  Call if having any hypoglycemia  A1c on the next visit   Patient Instructions  Check blood sugars on waking up 3-4  times a week Also check blood sugars about 2 hours after a meal and do this after different meals by rotation  Recommended blood sugar levels on waking up is 90-130 and about 2 hours after meal is 130-160  Please bring your blood sugar monitor to each visit, thank you  Take 45 units on waking up, 60 units before lunch; pm 35 units before supper  Insulin is 30 min before meals      Counseling time on subjects discussed above is over 50% of today's 25 minute visit  Rochester Serpe 10/04/2015, 4:38 PM   Note: This office note was prepared with Dragon voice recognition system technology. Any transcriptional errors that result from this process are unintentional.

## 2015-10-04 NOTE — Patient Instructions (Signed)
Check blood sugars on waking up 3-4  times a week Also check blood sugars about 2 hours after a meal and do this after different meals by rotation  Recommended blood sugar levels on waking up is 90-130 and about 2 hours after meal is 130-160  Please bring your blood sugar monitor to each visit, thank you  Take 45 units on waking up, 60 units before lunch; pm 35 units before supper  Insulin is 30 min before meals

## 2015-10-11 ENCOUNTER — Telehealth: Payer: Self-pay | Admitting: Cardiology

## 2015-10-11 MED ORDER — NITROGLYCERIN 0.4 MG SL SUBL: 0.4000 mg | SUBLINGUAL_TABLET | SUBLINGUAL | Status: AC | PRN

## 2015-10-11 NOTE — Telephone Encounter (Signed)
New message       *STAT* If patient is at the pharmacy, call can be transferred to refill team.   1. Which medications need to be refilled? (please list name of each medication and dose if known) nitro pills 2. Which pharmacy/location (including street and city if local pharmacy) is medication to be sent to? [facility] 3. Do they need a 30 day or 90 day supply? 30 day Pt looked at his nitro pill box and discovered that it had expired a long time ago. He said he did not know which doctor prescribe the rx but he want it to have on hand.  Call if any problems

## 2015-10-11 NOTE — Telephone Encounter (Signed)
Rx(s) sent to pharmacy electronically.  

## 2015-10-18 ENCOUNTER — Other Ambulatory Visit: Payer: Self-pay | Admitting: Endocrinology

## 2015-10-19 ENCOUNTER — Telehealth: Payer: Self-pay | Admitting: Endocrinology

## 2015-10-19 NOTE — Telephone Encounter (Signed)
Need to know if blood sugar was checked when he passed out.  Also need to know his blood sugar readings for the last 2-3 days

## 2015-10-19 NOTE — Telephone Encounter (Signed)
Pt nurse case manager called and said that the pt passed out while in the bank this morning and wants to discuss maybe a sliding scale for his insulin.  She is concerned because PT is on a high dose of insulin and he doesn't really eat breakfast and still takes this much insulin. CB # [place] [employer] [phone] x [phone] She said it is ok to leave a message.

## 2015-10-19 NOTE — Telephone Encounter (Signed)
I left a message for her to return call with the information.

## 2015-10-19 NOTE — Telephone Encounter (Signed)
Please see below.

## 2015-10-20 ENCOUNTER — Ambulatory Visit (INDEPENDENT_AMBULATORY_CARE_PROVIDER_SITE_OTHER): Payer: 59

## 2015-10-20 ENCOUNTER — Encounter (HOSPITAL_COMMUNITY): Payer: Self-pay | Admitting: Emergency Medicine

## 2015-10-20 ENCOUNTER — Telehealth: Payer: Self-pay | Admitting: Endocrinology

## 2015-10-20 ENCOUNTER — Ambulatory Visit (HOSPITAL_COMMUNITY)
Admission: EM | Admit: 2015-10-20 | Discharge: 2015-10-20 | Disposition: A | Discharge status: Home / Self Care | Payer: 59 | Attending: Family Medicine | Admitting: Family Medicine

## 2015-10-20 DIAGNOSIS — S8991XA Unspecified injury of right lower leg, initial encounter: Secondary | ICD-10-CM

## 2015-10-20 NOTE — ED Notes (Signed)
NP at bedside.

## 2015-10-20 NOTE — Telephone Encounter (Signed)
We do not dose insulin on sliding scale For now he will reduce his insulin dose at lunchtime to 45 units and at suppertime to 30 units.  If he has breakfast and he can take 35 units before eating He will need to see Bonita Quin next week for follow-up if she has an appointment open otherwise see me as soon as possible
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
## 2015-10-20 NOTE — Telephone Encounter (Signed)
Noted, patient is aware of the new doses, appointment was scheduled for first available on  05/01

## 2015-10-20 NOTE — Telephone Encounter (Signed)
PT requests call back from you  °

## 2015-10-20 NOTE — Telephone Encounter (Signed)
I spoke with his Caseworker, she said that William Pacheco has been taking his U-500 insulin as follows 45-60-40, I advised her that the night dose should be 35,  She said when he passed out at the bank his sugar was in the 40's, ems was called and they gave him oral glucose as well as starting an I.V to give him dextrose. She states that his sugars have been below 100 all week and that he's not eating anything for breakfast, but still takes his full 45 units.  Yesterday morning his sugar was 67. She said he eats at the following times  Lunch 12:30-1 usually has a sandwich,  Dinner 9:30 pm, usually eats something very small, and then takes his U-500 at bedtime.  Nurse would like for him to have a sliding scale.  Please advise

## 2015-10-20 NOTE — ED Notes (Signed)
PT was at the bank yesterday and his sugar "dropped out" on him and he fell. Pt reports he twisted the right knee when he fell yesterday.

## 2015-10-24 ENCOUNTER — Telehealth: Payer: Self-pay | Admitting: Endocrinology

## 2015-10-24 NOTE — Telephone Encounter (Signed)
Pt wants to speak with you about sugars please

## 2015-10-25 NOTE — Telephone Encounter (Signed)
Bonita Quin, could you please talk to him.  Thanks

## 2015-10-31 ENCOUNTER — Ambulatory Visit (INDEPENDENT_AMBULATORY_CARE_PROVIDER_SITE_OTHER): Payer: 59 | Admitting: Endocrinology

## 2015-10-31 ENCOUNTER — Encounter: Payer: Self-pay | Admitting: Endocrinology

## 2015-10-31 VITALS — BP 128/60 | HR 76 | Temp 98.5°F | Resp 16 | Ht 71.0 in | Wt 251.0 lb

## 2015-10-31 DIAGNOSIS — E1349 Other specified diabetes mellitus with other diabetic neurological complication: Secondary | ICD-10-CM | POA: Diagnosis not present

## 2015-10-31 LAB — POCT GLUCOSE (DEVICE FOR HOME USE): GLUCOSE FASTING, POC: 112 mg/dL — AB (ref 70–99)

## 2015-10-31 NOTE — Progress Notes (Signed)
Patient ID: William Pacheco, male   DOB: 1951-01-28, 65 y.o.   MRN: [id number]           Reason for Appointment: Follow-up for Type 2 Diabetes  Referring physician:  History of Present Illness:          Diagnosis: Type 2 diabetes mellitus, date of diagnosis: 1999       Previous history: He was virtually asymptomatic at the time of diagnosis even though his blood sugar at a free clinic was 550  Metformin was prescribed initially for his treatment but he does not know why this was stopped at some point He also thinks he had taken various other oral medications but no other injectable drugs before starting insulin in 2007 or so  Recent history:   INSULIN regimen is described as:  U-500 insulin with pen 35-- 45 units-- 35 at meals  .  If he has breakfast and he can take 35 units before eating  His blood sugars have been persistently poorly controlled since at least 2012 with only occasional A1c readings below 9% He has been on U-500 insulin for several months with the last A1c of 11.2 in 3/17  His blood sugars were evaluated with a continuous glucose monitoring which showed fairly good readings in March. However he was not taking the U-500 insulin prior to eating and his blood sugars tended to show higher readings after meals and midday also His dosage was changed to 3 times a day including small dose on waking up  Current management, blood sugar patterns and problems identified:  About a week or so after his last visit he started having lower blood sugars especially fasting  He also had no significant hypoglycemic episode midmorning when glucose went down to 42 and required assistance.  He is not acquiring significantly lower doses of insulin and blood sugars are also more consistently controlled  Since he is eating mostly 2 meals a day he may not take 3 injections.  With taking the insulin before meals he appears to have good readings in the afternoon and evenings although these  aren't checked very rarely  Again he has fairly good fasting readings after reducing his dose to 4/19  Previously was getting relatively low readings fasting  Oral hypoglycemic drugs the patient is taking are: Invokana 300 mg daily      Side effects from medications have been: None    Compliance with the medical regimen: Fair Hypoglycemia: none    Glucose monitoring:  done one time a day         Glucometer: One Touch.  Glucose from download:  Mean values apply above for all meters except median for One Touch  PRE-MEAL Fasting Lunch Dinner 7pm Overall  Glucose range: 58-158 121,128 144 145   Mean/median: 108    109   Self-care: The diet that the patient has been following is: tries to limit carbs .     Meals: 2-3 meals per day. Breakfast is sometimes eggs, lunch is occ sandwich, dinner 8-9 pm            Exercise: minimal And limited by claudication in the left leg and right knee pain        Dietician visit, most recent: 04/2014.               Weight history: 222-250  Wt Readings from Last 3 Encounters:  10/31/15 251 lb (113.853 kg)  10/04/15 260 lb (117.935 kg)  09/14/15 258 lb (117.028 kg)  Glycemic control:   Lab Results  Component Value Date   HGBA1C 11.2* 09/09/2015   HGBA1C 10.5 04/07/2015   HGBA1C 11.4* 08/30/2014   Lab Results  Component Value Date   LDLCALC 95 09/09/2015   CREATININE 1.12 09/09/2015    Office Visit on 10/31/2015  Component Date Value Ref Range Status  . Glucose Fasting, POC 10/31/2015 112* 70 - 99 mg/dL Final        Medication List       This list is accurate as of: 10/31/15  8:59 PM.  Always use your most recent med list.               aspirin 81 MG tablet  Take 81 mg by mouth daily.     atorvastatin 40 MG tablet  Commonly known as:  LIPITOR  TAKE 1 TABLET (40 MG TOTAL) BY MOUTH DAILY.     CALCIUM 1000 + D PO  Take 2 tablets by mouth daily.     calcium carbonate 600 MG Tabs tablet  Commonly known as:  OS-CAL  Take  600 mg by mouth 2 (two) times daily with a meal. Take two tablets daily     carvedilol 25 MG tablet  Commonly known as:  COREG  Take 37.5 mg by mouth 2 (two) times daily with a meal. (1 and 1/2 tablets)     cilostazol 50 MG tablet  Commonly known as:  PLETAL  TAKE 1 TABLET (50 MG TOTAL) BY MOUTH 2 (TWO) TIMES DAILY.     furosemide 40 MG tablet  Commonly known as:  LASIX  TAKE 80 MG IN AM ,40 MG IN PM ON MONDAYS AND FRIDAYS,OTHER DAYS TAKE 40 MG TWICE A DAY     GLUCOSAMINE CHONDR 1500 COMPLX PO  Take 1,500 mg by mouth daily. Reported on 09/14/2015     insulin regular human CONCENTRATED 500 UNIT/ML kwikpen  Commonly known as:  HUMULIN R U-500 KWIKPEN  Inject 25 units at breakfast, 60 units at lunch and 40 units at breakfast     INVOKANA 300 MG Tabs tablet  Generic drug:  canagliflozin  TAKE 1 TABLET BY MOUTH DAILY BEFORE BREAKFAST     KLOR-CON M10 10 MEQ tablet  Generic drug:  potassium chloride     losartan 100 MG tablet  Commonly known as:  COZAAR  Take 100 mg by mouth daily.     Magnesium Oxide 500 MG Caps  Take 1,000 mg by mouth 2 (two) times daily.     metFORMIN 500 MG 24 hr tablet  Commonly known as:  GLUCOPHAGE-XR  TAKE (3) TABLETS BY MOUTH DAILY     mupirocin ointment 2 %  Commonly known as:  BACTROBAN     nitroGLYCERIN 0.4 MG SL tablet  Commonly known as:  NITROSTAT  Place 1 tablet (0.4 mg total) under the tongue every 5 (five) minutes as needed for chest pain. MAX 3 doses.     omega-3 acid ethyl esters 1 g capsule  Commonly known as:  LOVAZA  Take 1,200 g by mouth 3 (three) times daily.     ONE TOUCH ULTRA TEST test strip  Generic drug:  glucose blood     PATADAY 0.2 % Soln  Generic drug:  Olopatadine HCl  Reported on 10/31/2015     pregabalin 100 MG capsule  Commonly known as:  LYRICA  Take 300 mg by mouth 3 (three) times daily.     ramipril 10 MG capsule  Commonly known as:  ALTACE  rivaroxaban 20 MG Tabs tablet  Commonly known as:  XARELTO    Take 1 tablet (20 mg total) by mouth daily with supper.     traMADol 50 MG tablet  Commonly known as:  ULTRAM  Take 1 tablet (50 mg total) by mouth every 6 (six) hours as needed.     Vitamin B-12 2000 MCG Tbcr  Take 3 tablets by mouth daily.     vitamin C 500 MG tablet  Commonly known as:  ASCORBIC ACID  Take 1,500 mg by mouth daily.     Vitamin D (Cholecalciferol) 1000 units Tabs  Take 3,000 Units by mouth daily.     vitamin E 400 UNIT capsule  Take 1,200 Units by mouth daily.        Allergies: No Known Allergies  Past Medical History  Diagnosis Date  . CAD (coronary artery disease), native coronary artery September 2008    Left main 50-60%, LAD 80% with D2 involved. RCA mid occlusion --> CABG  . 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 Ischemic cardiomyopathy September 2008    EF = 35-40%; THE 45-50% IN 2013 --> ECHO 03/03/13 - EF 40-45% LV: Moderate HK of basal-midlateral & inferior myocardium; c/w prior infarction in the distribution of the RCA or LCx. Mild-Mod MR with Calcified annulus. Mod TR  . History of MI (myocardial infarction) 2012    Noted by echocardiography, and nuclear stress testing. Known RCA occlusion prior to CABG  . PAF (paroxysmal atrial fibrillation) - initial presentation with RVR, and heart failure 2013    On a beta blocker, calcium channel blocker & Xarelto. Lexiscan Myoview 11/08/11 LV was enlarged with no evidence of ischemia. There was a moderate sized, moderate to severe in intensity fixed defect in the inferior wall suggestive of scar with no reversible ischemia. There was a basal septal akinesis w/severe global hypokinesis. EF = 31% (Which is different from the [facility] EF)  . Chronic diastolic CHF (congestive heart failure), NYHA class 2 (HCC) 2009, 2011; 2013    Exacerbated by A. fib RVR, currently on Lasix.  . Peripheral arterial disease (HCC) 2009, 2011; 2013    2009, 2011; 2013  . Renal artery stenosis,  non-flow-limiting (HCC)   . DM (diabetes mellitus), type 2 with neurological complications (HCC)     Peripheral neuropathy  . Diabetic peripheral neuropathy associated with type 2 diabetes mellitus (HCC)   . Hyperlipidemia LDL goal <70     On statin therapy  . Obesity (BMI 30.0-34.9)   . Hypertension associated with diabetes (HCC)   . Erectile dysfunction associated with type 2 diabetes mellitus (HCC)  2006  . Ischemic mitral regurgitation  1980s    Mild to moderate  . Thrombocytopenia (HCC)      chronic  . Osteoarthritis of both knees 1990    Status post left knee a arthroscopy, currently Harding right knee  . History of hepatitis B 1980s  . Adopted     Adopted [W09.81]    Past Surgical History  Procedure Laterality Date  . Femoral endarterectomy  03-15-2008    Left EIA & CFA endarterectomy by Dr. Madilyn Fireman  . Knee arthroscopy Left 1990    For osteoarthritis pain.  . Tonsillectomy    . Pleural effusion drainage Right   . Coronary artery bypass graft  03/2007    CABG x 6 LIMA-LAD, left radial to diagonal 1, SVG to OM1-OM2 sequential, SVG to PDA-PLA sequential.  .  Transthoracic echocardiogram  November 2014    EF 40-45%. Moderate hypokinesis of the basal and mid lateral and inferior myocardium. Mild to moderate MR. Mild LA dilation.  . Transthoracic echocardiogram  07/26/2013    In setting of acute illness: EF 15-20%, atrial fibrillation; diffuse hypokinesis with inferolateral abnormalities consistent with ischemic heart disease; grade 3 diastolic dysfunction/restrictive physiology - elevated LVEDP/LAP..  . Transthoracic echocardiogram  09/09/2013    40%. Septal hypokinesis. Moderate to severely dilated LV. Mild LVH and diffuse hypokinesis. Mild to moderate MR.  Marland Kitchen Nm myoview ltd  08/13/2013    Moderate LV dilation. Abnormal study with all high-risk features, unchanged from October 2014. Inferior defect likely representing nontransmural scar. Suggesting nonischemic cardiopathy  superimposed on known ischemic cardiomyopathy.; Severe global hypokinesis with EF of roughly 31%.  . Vascular surgery Bilateral 2006    pt reports about 4 vascular surgeries starting in 2006 and last was in 2013.  Marland Kitchen Patch angioplasty Left 09/16/2013    Procedure: PATCH ANGIOPLASTY OF LEFT BELOW THE KNEE POPLITEAL ARTERY;  Surgeon: Fransisco Hertz, MD;  Location: [facility] OR;  Service: Vascular;  Laterality: Left;  . Intraoperative arteriogram Left 09/16/2013    Procedure: INTRA OPERATIVE ARTERIOGRAM;  Surgeon: Fransisco Hertz, MD;  Location: [facility] OR;  Service: Vascular;  Laterality: Left;  . Groin dissection Left 09/16/2013    Procedure: GROIN EXPLORATION;  Surgeon: Fransisco Hertz, MD;  Location: [facility] OR;  Service: Vascular;  Laterality: Left;  . Endarterectomy popliteal Left 09/16/2013    Procedure: ENDARTERECTOMYOF LEFT POPLITEAL ARTERY;  Surgeon: Fransisco Hertz, MD;  Location: [facility] OR;  Service: Vascular;  Laterality: Left;  . Abdominal aortagram N/A 08/30/2011    Procedure: ABDOMINAL Ronny Flurry;  Surgeon: Fransisco Hertz, MD;  Location: [facility] CATH LAB;  Service: Cardiovascular;  Laterality: N/A;  . Percutaneous stent intervention Left 08/30/2011    Procedure: PERCUTANEOUS STENT INTERVENTION;  Surgeon: Fransisco Hertz, MD;  Location: [facility] CATH LAB;  Service: Cardiovascular;  Laterality: Left;  . Lower extremity angiogram Left 09/07/2013    Procedure: LOWER EXTREMITY ANGIOGRAM;  Surgeon: Fransisco Hertz, MD;  Location: [facility] CATH LAB;  Service: Cardiovascular;  Laterality: Left;  . Nm myoview ltd  07/22/2014    EF ~35%. High-Risk study do to moderate size, medial intensity partially reversible defect consistent with prior infave High-Risk do to reduced LV function; inferior akinesia and global hypokinesis   . Abis  December 2016    Are ABI 0.78 = moderate arterial occlusive disease; L ABI 0.51 = moderate-severe occlusive arterial disease    Family History  Problem Relation Age of Onset  . Adopted: Yes  . Cancer Mother     Social  History:  reports that he quit smoking about 10 years ago. His smoking use included Cigarettes. He started smoking about 42 years ago. He smoked 0.50 packs per day. He has never used smokeless tobacco. He reports that he does not drink alcohol or use illicit drugs.    Review of Systems         Most recent eye exam was 2015        Lipids: On Lipitor 40mg  with good control       Lab Results  Component Value Date   CHOL 161 09/09/2015   HDL 44.10 09/09/2015   LDLCALC 95 09/09/2015   TRIG 110.0 09/09/2015   CHOLHDL 4 09/09/2015                  The blood pressure has  been controlled with Losartan, Ramipril 10 mg and Carvedilol, followed by cardiologist  Complications: Peripheral neuropathy, cardiovascular disease, peripheral vascular disease with claudication   Last diabetic foot exam in 3/16 showed slightly decreased monofilament sensation and absent pulses  LABS:  Office Visit on 10/31/2015  Component Date Value Ref Range Status  . Glucose Fasting, POC 10/31/2015 112* 70 - 99 mg/dL Final    Physical Examination:  BP 128/60 mmHg  Pulse 76  Temp(Src) 98.5 F (36.9 C)  Resp 16  Ht 5\' 11"  (1.803 m)  Wt 251 lb (113.853 kg)  BMI 35.02 kg/m2  SpO2 96%             .        ASSESSMENT:  Diabetes type 2, uncontrolled with insulin resistance and moderate obesity.   See history of present illness for detailed discussion of current diabetes management, blood sugar patterns and problems identified   He appears to have much better blood sugar control and with improvement of glucose toxicity his insulin sensitivity has increased and he was starting to get hypoglycemic a couple of weeks ago. He was told to reduce his insulin but continue the doses both in the morning on waking up and before meals. Also timing his insulin properly has helped his control. Difficulty continues with assessing his mealtime insulin requirement since he only rarely checks his blood sugars after meals  despite reminders   PLAN:   No change in insulin dose as yet  More consistent monitoring after meals  Adjust mealtime doses if eating larger meals  To call if he has any  low blood sugars   Patient Instructions  Take insulin before meals, usually 35 at supper unless eating larger meal  Check blood sugars on waking up 4-5  times a week Also check blood sugars about 2-3 hours after a meal and do this after different meals by rotation  Recommended blood sugar levels on waking up is 90-130 and about 2 hours after meal is 130-180  Please bring your blood sugar monitor to each visit, thank you          [facility] 10/31/2015, 8:59 PM   Note: This office note was prepared with Dragon voice recognition system technology. Any transcriptional errors that result from this process are unintentional.

## 2015-10-31 NOTE — Patient Instructions (Addendum)
Take insulin before meals, usually 35 at supper unless eating larger meal  Check blood sugars on waking up 4-5  times a week Also check blood sugars about 2-3 hours after a meal and do this after different meals by rotation  Recommended blood sugar levels on waking up is 90-130 and about 2 hours after meal is 130-180  Please bring your blood sugar monitor to each visit, thank you

## 2015-11-03 ENCOUNTER — Telehealth: Payer: Self-pay | Admitting: *Deleted

## 2015-11-03 NOTE — Telephone Encounter (Signed)
LAST  CARDIOLOGY VISIT 08/23/15  Request for surgical clearance:  1. What type of surgery is being performed? RIGHT  TOTAL KNEE REPLACEMENT   2. When is this surgery scheduled?   TBA --AWAITING FOR  CLEARANCE  3. Are there any medications that need to be held prior to surgery and how long?  XARELTO AND  81 MG ASPIRIN  4. Name of physician performing surgery?  DR TIMOTHY MURPHY  5. What is your office phone and fax number?  PH [phone]   FAX [phone]  ATTN : KELLY  ( NEED LAST OFFICE NOTE) 6.

## 2015-11-04 ENCOUNTER — Other Ambulatory Visit: Payer: Self-pay | Admitting: Endocrinology

## 2015-11-04 NOTE — Telephone Encounter (Signed)
I saw him in February. He was doing okay at the time. Had a little bit of heart failure symptoms.  Not really had any anginal symptoms. Has diabetes but not on insulin. Normal Renal Fxn. No h/o CVA.   PREOPERATIVE CARDIAC RISK ASSESSMENT   Revised Cardiac Risk Index:  High Risk Surgery: no; knee surgery, low cardiac risk  Defined as Intraperitoneal, intrathoracic or suprainguinal vascular  Active CAD: Not really. No anginal symptoms  CHF: yes; mild. On stable medications  Cerebrovascular Disease: no;   Diabetes: yes; On Insulin: no - therefore not counted  CKD (Cr >~ 2): no; last check was roughly 1.2  Total: 1 Estimated Risk of Adverse Outcome: LOW Estimated Risk of MI, PE, VF/VT (Cardiac Arrest), Complete Heart Block: <3 % - reduced to ~1% with chronic BB dose.   ACC/AHA Guidelines for "Clearance":  Step 1 - Need for Emergency Surgery: No: Elective  If Yes - go straight to OR with perioperative surveillance  Step 2 - Active Cardiac Conditions (Unstable Angina, Decompensated HF, Significant  Arrhytmias - Complete HB, Mobitz II, Symptomatic VT or SVT, Severe Aortic Stenosis - mean gradient > 40 mmHg, Valve area < 1.0 cm2):   No: Afib - but controlled  If Yes - Evaluate & Treat per ACC/AHA Guidelines  Step 3 -  Low Risk Surgery: Yes  If Yes --> proceed to OR  If No --> Step 4  Step 4 - Functional Capacity >= 4 METS without symptoms: Cannot Assess due to claudication   If Yes --> proceed to OR  If No --> Step 5  Step 5 --  Clinical Risk Factors (CRF)   1-2 or more CRFs: Yes  If Yes -- assess Surgical Risk, --   (High Risk Non-cardiac), Intraabdominal or thoracic vascular surgery --> Proceed to OR, or consider testing if it will change management.  Intermediate Risk: Proceed to OR with HR control, or consider testing if it will change management  Low Risk = Proceed to OR.   OK to hold Anticoagulation x 48 hrs pre-op.  Restart when safe  post-op.   Marykay Lex, MD

## 2015-11-07 NOTE — Telephone Encounter (Signed)
INFORMED KELLY INFORMATION HAS BEEN ROUTED

## 2015-11-11 ENCOUNTER — Other Ambulatory Visit: Payer: Self-pay | Admitting: Endocrinology

## 2015-11-16 NOTE — Telephone Encounter (Signed)
I phoned him twice and he did not answer.  No answering machine.

## 2015-11-29 ENCOUNTER — Encounter: Payer: Self-pay | Admitting: Family

## 2015-12-01 ENCOUNTER — Other Ambulatory Visit: Payer: 59

## 2015-12-01 ENCOUNTER — Other Ambulatory Visit (INDEPENDENT_AMBULATORY_CARE_PROVIDER_SITE_OTHER): Payer: 59

## 2015-12-01 DIAGNOSIS — E1349 Other specified diabetes mellitus with other diabetic neurological complication: Secondary | ICD-10-CM | POA: Diagnosis not present

## 2015-12-01 DIAGNOSIS — Z794 Long term (current) use of insulin: Secondary | ICD-10-CM | POA: Diagnosis not present

## 2015-12-01 DIAGNOSIS — E1165 Type 2 diabetes mellitus with hyperglycemia: Secondary | ICD-10-CM | POA: Diagnosis not present

## 2015-12-01 LAB — BASIC METABOLIC PANEL
BUN: 24 mg/dL — AB (ref 6–23)
CHLORIDE: 101 meq/L (ref 96–112)
CO2: 31 mEq/L (ref 19–32)
CREATININE: 1.15 mg/dL (ref 0.40–1.50)
Calcium: 9.5 mg/dL (ref 8.4–10.5)
GFR: 82.54 mL/min (ref >60.00)
Glucose, Bld: 180 mg/dL — ABNORMAL HIGH (ref 70–99)
Potassium: 4.6 mEq/L (ref 3.5–5.1)
Sodium: 138 mEq/L (ref 135–145)

## 2015-12-01 LAB — HEMOGLOBIN A1C: Hgb A1c MFr Bld: 6.5 % (ref 4.6–6.5)

## 2015-12-01 LAB — MICROALBUMIN / CREATININE URINE RATIO
Creatinine,U: 22.8 mg/dL
Microalb Creat Ratio: 3.1 mg/g (ref 0.0–30.0)

## 2015-12-02 ENCOUNTER — Ambulatory Visit: Payer: 59 | Admitting: Family

## 2015-12-02 ENCOUNTER — Ambulatory Visit (HOSPITAL_COMMUNITY): Payer: 59

## 2015-12-02 ENCOUNTER — Ambulatory Visit (HOSPITAL_COMMUNITY): Payer: 59 | Attending: Family

## 2015-12-05 ENCOUNTER — Ambulatory Visit: Payer: 59 | Admitting: Endocrinology

## 2015-12-06 ENCOUNTER — Ambulatory Visit (INDEPENDENT_AMBULATORY_CARE_PROVIDER_SITE_OTHER): Payer: 59 | Admitting: Podiatry

## 2015-12-06 ENCOUNTER — Encounter: Payer: Self-pay | Admitting: Podiatry

## 2015-12-06 DIAGNOSIS — L84 Corns and callosities: Secondary | ICD-10-CM | POA: Diagnosis not present

## 2015-12-06 DIAGNOSIS — B351 Tinea unguium: Secondary | ICD-10-CM | POA: Diagnosis not present

## 2015-12-06 DIAGNOSIS — M79673 Pain in unspecified foot: Secondary | ICD-10-CM | POA: Diagnosis not present

## 2015-12-06 DIAGNOSIS — E1159 Type 2 diabetes mellitus with other circulatory complications: Secondary | ICD-10-CM | POA: Diagnosis not present

## 2015-12-06 NOTE — Progress Notes (Addendum)
Patient ID: William Pacheco, male   DOB: 07-May-1951, 64 y.o.   MRN: [id number] Complaint:  Visit Type: Patient returns to my office for continued preventative foot care services. Complaint: Patient states" my nails have grown long and thick and become painful to walk and wear shoes" Patient has been diagnosed with DM with neuropathy. The patient presents for preventative foot care services. No changes to ROS.  Severe dry feet.  Podiatric Exam: Vascular: dorsalis pedis and posterior tibial pulses are palpable bilateral. Capillary return is immediate. Temperature gradient is WNL. Skin turgor WNL  Sensorium: Normal Semmes Weinstein monofilament test right foot.  Absent LOPS left foot.. Normal tactile sensation bilaterally. Nail Exam: Pt has thick disfigured discolored nails with subungual debris noted bilateral entire nail hallux through fifth toenails Ulcer Exam: There is no evidence of ulcer or pre-ulcerative changes or infection. Orthopedic Exam: Muscle tone and strength are WNL. No limitations in general ROM. No crepitus or effusions noted. Foot type and digits show no abnormalities. Bony prominences are unremarkable. Skin: No Porokeratosis. No infection or ulcers.  Severe dry skin noted. Pinch callus B/L.  Callus sub 1 B/L  Heel callus B/LHealing ulcer with black eschar in ulcer fifth toe left foot.  Diagnosis:  Onychomycosis, , Pain in right toe, pain in left toes,  Debridement of callus  Treatment & Plan Procedures and Treatment: Consent by patient was obtained for treatment procedures. The patient understood the discussion of treatment and procedures well. All questions were answered thoroughly reviewed. Debridement of mycotic and hypertrophic toenails, 1 through 5 bilateral and clearing of subungual debris. No ulceration, no infection noted. Debridement of callus. Healing ulcer fifth toe left foot with eschar in ulcer. Return Visit-Office Procedure: Patient instructed to return to the office for  a follow up visit 10 weeks  for continued evaluation and treatment.    Helane Gunther DPM

## 2015-12-07 ENCOUNTER — Encounter: Payer: Self-pay | Admitting: Endocrinology

## 2015-12-07 ENCOUNTER — Ambulatory Visit (INDEPENDENT_AMBULATORY_CARE_PROVIDER_SITE_OTHER): Payer: 59 | Admitting: Endocrinology

## 2015-12-07 VITALS — BP 122/70 | HR 88 | Temp 98.4°F | Resp 12 | Wt 251.0 lb

## 2015-12-07 DIAGNOSIS — Z794 Long term (current) use of insulin: Secondary | ICD-10-CM | POA: Diagnosis not present

## 2015-12-07 DIAGNOSIS — E1165 Type 2 diabetes mellitus with hyperglycemia: Secondary | ICD-10-CM | POA: Diagnosis not present

## 2015-12-07 NOTE — Progress Notes (Signed)
Patient ID: William Pacheco, male   DOB: [birth year], 65 y.o.   MRN: [id number]           Reason for Appointment: Follow-up for Type 2 Diabetes  Referring physician:  History of Present Illness:          Diagnosis: Type 2 diabetes mellitus, date of diagnosis: 1999       Previous history: He was virtually asymptomatic at the time of diagnosis even though his blood sugar at a free clinic was 550  Metformin was prescribed initially for his treatment but he does not know why this was stopped at some point He also thinks he had taken various other oral medications but no other injectable drugs before starting insulin in 2007 or so  Recent history:   INSULIN regimen is described as:  U-500 insulin with pen 35-- 45 units-- 35 at meals  His blood sugars had been persistently poorly controlled since at least 2012 with only occasional A1c readings below 9% He has been on U-500 insulin for several months with the last A1c of 11.2 in 3/17  His U-500 insulin dosage on his last visit was changed to 3 times a day and advised him to take this 30 minute before eating His A1c is now much better at 6.5  Current management, blood sugar patterns and problems identified:  He is checking his blood sugars mostly fasting and in the afternoons, probably mostly before evening meal  Has not had any hypoglycemia with this regimen of insulin  He is trying to be fairly good with diet but not clear why some days his blood sugars are significantly high in the afternoon, his intake at lunchtime is variable  He does not adjust his insulin based on what he is eating  Oral hypoglycemic drugs the patient is taking are: Invokana 300 mg daily      Side effects from medications have been: None    Compliance with the medical regimen: Fair Hypoglycemia: none   Glucose monitoring:  done one time a day         Glucometer: One Touch.  Glucose from download:  Mean values apply above for all meters except median for One  Touch  PRE-MEAL Fasting Lunch 3-7 PM  Bedtime Overall  Glucose range: 68-146   102-313     Mean/median: 97   144   111     Self-care: The diet that the patient has been following is: tries to limit carbs .     Meals: 2-3 meals per day. Breakfast is sometimes eggs or cereal, lunch is Sometimes a sandwich, dinner 8-9 pm            Exercise: minimal and limited by claudication in the left leg and right knee pain        Dietician visit, most recent: 04/2014.               Weight history: 222-250  Wt Readings from Last 3 Encounters:  12/07/15 251 lb (113.853 kg)  10/31/15 251 lb (113.853 kg)  10/04/15 260 lb (117.935 kg)    Glycemic control:   Lab Results  Component Value Date   HGBA1C 6.5 12/01/2015   HGBA1C 11.2* 09/09/2015   HGBA1C 10.5 04/07/2015   Lab Results  Component Value Date   MICROALBUR <0.7 12/01/2015   LDLCALC 95 09/09/2015   CREATININE 1.15 12/01/2015    Lab on 12/01/2015  Component Date Value Ref Range Status  . Hgb A1c MFr Bld 12/01/2015 6.5  4.6 - 6.5 % Final   Glycemic Control Guidelines for People with Diabetes:Non Diabetic:  <6%Goal of Therapy: <7%Additional Action Suggested:  >8%   . Sodium 12/01/2015 138  135 - 145 mEq/L Final  . Potassium 12/01/2015 4.6  3.5 - 5.1 mEq/L Final  . Chloride 12/01/2015 101  96 - 112 mEq/L Final  . CO2 12/01/2015 31  19 - 32 mEq/L Final  . Glucose, Bld 12/01/2015 180* 70 - 99 mg/dL Final  . BUN 65/78/4696 24* 6 - 23 mg/dL Final  . Creatinine, Ser 12/01/2015 1.15  0.40 - 1.50 mg/dL Final  . Calcium 29/52/8413 9.5  8.4 - 10.5 mg/dL Final  . GFR 24/40/1027 82.54  >60.00 mL/min Final  . Microalb, Ur 12/01/2015 <0.7  0.0 - 1.9 mg/dL Final  . Creatinine,U 25/36/6440 22.8   Final  . Microalb Creat Ratio 12/01/2015 3.1  0.0 - 30.0 mg/g Final        Medication List       This list is accurate as of: 12/07/15 11:59 PM.  Always use your most recent med list.               aspirin 81 MG tablet  Take 81 mg by mouth  daily.     atorvastatin 40 MG tablet  Commonly known as:  LIPITOR  TAKE 1 TABLET (40 MG TOTAL) BY MOUTH DAILY.     CALCIUM 1000 + D PO  Take 2 tablets by mouth daily.     calcium carbonate 600 MG Tabs tablet  Commonly known as:  OS-CAL  Take 600 mg by mouth 2 (two) times daily with a meal. Take two tablets daily     carvedilol 25 MG tablet  Commonly known as:  COREG  Take 37.5 mg by mouth 2 (two) times daily with a meal. (1 and 1/2 tablets)     cilostazol 50 MG tablet  Commonly known as:  PLETAL  TAKE 1 TABLET (50 MG TOTAL) BY MOUTH 2 (TWO) TIMES DAILY.     furosemide 40 MG tablet  Commonly known as:  LASIX  TAKE 80 MG IN AM ,40 MG IN PM ON MONDAYS AND FRIDAYS,OTHER DAYS TAKE 40 MG TWICE A DAY     GLUCOSAMINE CHONDR 1500 COMPLX PO  Take 1,500 mg by mouth daily. Reported on 09/14/2015     insulin regular human CONCENTRATED 500 UNIT/ML kwikpen  Commonly known as:  HUMULIN R U-500 KWIKPEN  Inject 25 units at breakfast, 60 units at lunch and 40 units at breakfast     INVOKANA 300 MG Tabs tablet  Generic drug:  canagliflozin  TAKE 1 TABLET BY MOUTH DAILY BEFORE BREAKFAST     KLOR-CON M10 10 MEQ tablet  Generic drug:  potassium chloride     losartan 100 MG tablet  Commonly known as:  COZAAR  Take 100 mg by mouth daily.     Magnesium Oxide 500 MG Caps  Take 1,000 mg by mouth 2 (two) times daily.     metFORMIN 500 MG 24 hr tablet  Commonly known as:  GLUCOPHAGE-XR  TAKE (3) TABLETS BY MOUTH DAILY     mupirocin ointment 2 %  Commonly known as:  BACTROBAN     nitroGLYCERIN 0.4 MG SL tablet  Commonly known as:  NITROSTAT  Place 1 tablet (0.4 mg total) under the tongue every 5 (five) minutes as needed for chest pain. MAX 3 doses.     omega-3 acid ethyl esters 1 g capsule  Commonly known as:  LOVAZA  Take 1,200 g by mouth 3 (three) times daily.     ONE TOUCH ULTRA TEST test strip  Generic drug:  glucose blood     PATADAY 0.2 % Soln  Generic drug:  Olopatadine HCl    Reported on 10/31/2015     pregabalin 100 MG capsule  Commonly known as:  LYRICA  Take 300 mg by mouth 3 (three) times daily.     ramipril 10 MG capsule  Commonly known as:  ALTACE     rivaroxaban 20 MG Tabs tablet  Commonly known as:  XARELTO  Take 1 tablet (20 mg total) by mouth daily with supper.     traMADol 50 MG tablet  Commonly known as:  ULTRAM  Take 1 tablet (50 mg total) by mouth every 6 (six) hours as needed.     Vitamin B-12 2000 MCG Tbcr  Take 3 tablets by mouth daily.     vitamin C 500 MG tablet  Commonly known as:  ASCORBIC ACID  Take 1,500 mg by mouth daily.     Vitamin D (Cholecalciferol) 1000 units Tabs  Take 3,000 Units by mouth daily.     vitamin E 400 UNIT capsule  Take 1,200 Units by mouth daily.        Allergies: No Known Allergies  Past Medical History  Diagnosis Date  . CAD (coronary artery disease), native coronary artery September 2008    Left main 50-60%, LAD 80% with D2 involved. RCA mid occlusion --> CABG  . 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 Ischemic cardiomyopathy September 2008    EF = 35-40%; THE 45-50% IN 2013 --> ECHO 03/03/13 - EF 40-45% LV: Moderate HK of basal-midlateral & inferior myocardium; c/w prior infarction in the distribution of the RCA or LCx. Mild-Mod MR with Calcified annulus. Mod TR  . History of MI (myocardial infarction) 2012    Noted by echocardiography, and nuclear stress testing. Known RCA occlusion prior to CABG  . PAF (paroxysmal atrial fibrillation) - initial presentation with RVR, and heart failure 2013    On a beta blocker, calcium channel blocker & Xarelto. Lexiscan Myoview 11/08/11 LV was enlarged with no evidence of ischemia. There was a moderate sized, moderate to severe in intensity fixed defect in the inferior wall suggestive of scar with no reversible ischemia. There was a basal septal akinesis w/severe global hypokinesis. EF = 31% (Which is different from the  [facility] EF)  . Chronic diastolic CHF (congestive heart failure), NYHA class 2 (HCC) 2009, 2011; 2013    Exacerbated by A. fib RVR, currently on Lasix.  . Peripheral arterial disease (HCC) 2009, 2011; 2013    2009, 2011; 2013  . Renal artery stenosis, non-flow-limiting (HCC)   . DM (diabetes mellitus), type 2 with neurological complications (HCC)     Peripheral neuropathy  . Diabetic peripheral neuropathy associated with type 2 diabetes mellitus (HCC)   . Hyperlipidemia LDL goal <70     On statin therapy  . Obesity (BMI 30.0-34.9)   . Hypertension associated with diabetes (HCC)   . Erectile dysfunction associated with type 2 diabetes mellitus (HCC)  2006  . Ischemic mitral regurgitation  1980s    Mild to moderate  . Thrombocytopenia (HCC)      chronic  . Osteoarthritis of both knees 1990    Status post left knee a arthroscopy, currently Hard[place] right knee  . History of hepatitis B 1980s  . Adopted  Adopted [Z61.09]    Past Surgical History  Procedure Laterality Date  . Femoral endarterectomy  03-15-2008    Left EIA & CFA endarterectomy by Dr. Madilyn Fireman  . Knee arthroscopy Left 1990    For osteoarthritis pain.  . Tonsillectomy    . Pleural effusion drainage Right   . Coronary artery bypass graft  03/2007    CABG x 6 LIMA-LAD, left radial to diagonal 1, SVG to OM1-OM2 sequential, SVG to PDA-PLA sequential.  . Transthoracic echocardiogram  November 2014    EF 40-45%. Moderate hypokinesis of the basal and mid lateral and inferior myocardium. Mild to moderate MR. Mild LA dilation.  . Transthoracic echocardiogram  07/26/2013    In setting of acute illness: EF 15-20%, atrial fibrillation; diffuse hypokinesis with inferolateral abnormalities consistent with ischemic heart disease; grade 3 diastolic dysfunction/restrictive physiology - elevated LVEDP/LAP..  . Transthoracic echocardiogram  09/09/2013    40%. Septal hypokinesis. Moderate to severely dilated LV. Mild LVH and diffuse  hypokinesis. Mild to moderate MR.  Marland Kitchen Nm myoview ltd  08/13/2013    Moderate LV dilation. Abnormal study with all high-risk features, unchanged from October 2014. Inferior defect likely representing nontransmural scar. Suggesting nonischemic cardiopathy superimposed on known ischemic cardiomyopathy.; Severe global hypokinesis with EF of roughly 31%.  . Vascular surgery Bilateral 2006    pt reports about 4 vascular surgeries starting in 2006 and last was in 2013.  Marland Kitchen Patch angioplasty Left 09/16/2013    Procedure: PATCH ANGIOPLASTY OF LEFT BELOW THE KNEE POPLITEAL ARTERY;  Surgeon: Fransisco Hertz, MD;  Location: [facility] OR;  Service: Vascular;  Laterality: Left;  . Intraoperative arteriogram Left 09/16/2013    Procedure: INTRA OPERATIVE ARTERIOGRAM;  Surgeon: Fransisco Hertz, MD;  Location: [facility] OR;  Service: Vascular;  Laterality: Left;  . Groin dissection Left 09/16/2013    Procedure: GROIN EXPLORATION;  Surgeon: Fransisco Hertz, MD;  Location: [facility] OR;  Service: Vascular;  Laterality: Left;  . Endarterectomy popliteal Left 09/16/2013    Procedure: ENDARTERECTOMYOF LEFT POPLITEAL ARTERY;  Surgeon: Fransisco Hertz, MD;  Location[facility] OR;  Service: Vascular;  Laterality: Left;  . Abdominal aortagram N/A 08/30/2011    Procedure: ABDOMINAL Ronny Flurry;  Surgeon: Fransisco Hertz, MD;  Location: [facility] CATH LAB;  Service: Cardiovascular;  Laterality: N/A;  . Percutaneous stent intervention Left 08/30/2011    Procedure: PERCUTANEOUS STENT INTERVENTION;  Surgeon: Fransisco Hertz, MD;  Location: [facility] CATH LAB;  Service: Cardiovascular;  Laterality: Left;  . Lower extremity angiogram Left 09/07/2013    Procedure: LOWER EXTREMITY ANGIOGRAM;  Surgeon: Fransisco Hertz, MD;  Location: [facility] CATH LAB;  Service: Cardiovascular;  Laterality: Left;  . Nm myoview ltd  07/22/2014    EF ~35%. High-Risk study do to moderate size, medial intensity partially reversible defect consistent with prior infave High-Risk do to reduced LV function; inferior akinesia and global  hypokinesis   . Abis  December 2016    Are ABI 0.78 = moderate arterial occlusive disease; L ABI 0.51 = moderate-severe occlusive arterial disease    Family History  Problem Relation Age of Onset  . Adopted: Yes  . Cancer Mother     Social History:  reports that he quit smoking about 10 years ago. His smoking use included Cigarettes. He started smoking about 42 years ago. He smoked 0.50 packs per day. He has never used smokeless tobacco. He reports that he does not drink alcohol or use illicit drugs.    Review of Systems  Most recent eye exam was 2015        Lipids: On Lipitor 40mg  with good control       Lab Results  Component Value Date   CHOL 161 09/09/2015   HDL 44.10 09/09/2015   LDLCALC 95 09/09/2015   TRIG 110.0 09/09/2015   CHOLHDL 4 09/09/2015                  The blood pressure has been controlled with Losartan, Ramipril 10 mg and Carvedilol, followed by cardiologist Lasix  80 MG IN AM ,40 MG IN PM ON MONDAYS AND FRIDAYS,OTHER DAYS TAKE 40 MG TWICE A DAY  Complications: Peripheral neuropathy, cardiovascular disease, peripheral vascular disease with claudication   Last diabetic foot exam in 3/16 showed slightly decreased monofilament sensation and absent pulses  LABS:  Lab on 12/01/2015  Component Date Value Ref Range Status  . Hgb A1c MFr Bld 12/01/2015 6.5  4.6 - 6.5 % Final   Glycemic Control Guidelines for People with Diabetes:Non Diabetic:  <6%Goal of Therapy: <7%Additional Action Suggested:  >8%   . Sodium 12/01/2015 138  135 - 145 mEq/L Final  . Potassium 12/01/2015 4.6  3.5 - 5.1 mEq/L Final  . Chloride 12/01/2015 101  96 - 112 mEq/L Final  . CO2 12/01/2015 31  19 - 32 mEq/L Final  . Glucose, Bld 12/01/2015 180* 70 - 99 mg/dL Final  . BUN 16/04/9603 24* 6 - 23 mg/dL Final  . Creatinine, Ser 12/01/2015 1.15  0.40 - 1.50 mg/dL Final  . Calcium 54/03/8118 9.5  8.4 - 10.5 mg/dL Final  . GFR 14/78/2956 82.54  >60.00 mL/min Final  . Microalb, Ur  12/01/2015 <0.7  0.0 - 1.9 mg/dL Final  . Creatinine,U 21/30/8657 22.8   Final  . Microalb Creat Ratio 12/01/2015 3.1  0.0 - 30.0 mg/g Final    Physical Examination:  BP 122/70 mmHg  Pulse 88  Temp(Src) 98.4 F (36.9 C) (Oral)  Resp 12  Wt 251 lb (113.853 kg)  SpO2 96%             .        ASSESSMENT:  Diabetes type 2, with insulin resistance and moderate obesity.   See history of present illness for detailed discussion of current diabetes management, blood sugar patterns and problems identified   He appears to have much better blood sugar control and A1c is now 6.5 Although likely that his blood sugars are not high after meals consistently has not done any readings after his meals especially breakfast and supper Blood sugars maybe higher occasionally after lunch if he is not being consistent with his diet  PLAN:   No change in insulin doses  More consistent monitoring after meals especially supper  Adjust mealtime doses if eating larger meals especially lunch  To call if he has any consistent high or low readings  Again discussed timing of the insulin   Patient Instructions   Lab Results  Component Value Date   HGBA1C 6.5 12/01/2015    Check blood sugars on waking up 3-4  times a week Also check blood sugars about 2 hours after a meal and do this after different meals by rotation  Recommended blood sugar levels on waking up is 90-130 and about 2 hours after meal is 130-160  Please bring your blood sugar monitor to each visit, thank you  Take insulin 30 min before meals        Jera Headings 12/08/2015, 10:07 AM   Note:  This office note was prepared with Estate agent. Any transcriptional errors that result from this process are unintentional.

## 2015-12-07 NOTE — Patient Instructions (Signed)
Lab Results  Component Value Date   HGBA1C 6.5 12/01/2015    Check blood sugars on waking up 3-4  times a week Also check blood sugars about 2 hours after a meal and do this after different meals by rotation  Recommended blood sugar levels on waking up is 90-130 and about 2 hours after meal is 130-160  Please bring your blood sugar monitor to each visit, thank you  Take insulin 30 min before meals

## 2015-12-12 ENCOUNTER — Telehealth: Payer: Self-pay | Admitting: Endocrinology

## 2015-12-12 NOTE — Telephone Encounter (Signed)
See note below and please advise, Thanks! 

## 2015-12-12 NOTE — Telephone Encounter (Signed)
I had faxed the A1c report.  We can fax it again

## 2015-12-12 NOTE — Telephone Encounter (Signed)
Pt called and said that Dr. Lucianne Muss was supposed to send information to his orthopedic specialist, Dr. Eulah Pont.  PT stated that he was told we sent it last week, but that they have apparently not received that information and would like a call back when that is done.

## 2015-12-13 NOTE — Telephone Encounter (Signed)
A1C re faxed.

## 2016-01-09 ENCOUNTER — Inpatient Hospital Stay (HOSPITAL_COMMUNITY): Payer: 59

## 2016-01-09 ENCOUNTER — Inpatient Hospital Stay (HOSPITAL_COMMUNITY)
Admission: EM | Admit: 2016-01-09 | Discharge: 2016-01-31 | DRG: 308 | Disposition: E | Payer: 59 | Attending: Pulmonary Disease | Admitting: Pulmonary Disease

## 2016-01-09 ENCOUNTER — Ambulatory Visit: Payer: 59 | Admitting: Nurse Practitioner

## 2016-01-09 ENCOUNTER — Emergency Department (HOSPITAL_COMMUNITY): Payer: 59

## 2016-01-09 DIAGNOSIS — N179 Acute kidney failure, unspecified: Secondary | ICD-10-CM | POA: Diagnosis present

## 2016-01-09 DIAGNOSIS — Z66 Do not resuscitate: Secondary | ICD-10-CM

## 2016-01-09 DIAGNOSIS — I241 Dressler's syndrome: Secondary | ICD-10-CM

## 2016-01-09 DIAGNOSIS — Z794 Long term (current) use of insulin: Secondary | ICD-10-CM

## 2016-01-09 DIAGNOSIS — R402124 Coma scale, eyes open, to pain, 24 hours or more after hospital admission: Secondary | ICD-10-CM | POA: Diagnosis not present

## 2016-01-09 DIAGNOSIS — G9340 Encephalopathy, unspecified: Secondary | ICD-10-CM | POA: Insufficient documentation

## 2016-01-09 DIAGNOSIS — I255 Ischemic cardiomyopathy: Secondary | ICD-10-CM | POA: Diagnosis not present

## 2016-01-09 DIAGNOSIS — E1140 Type 2 diabetes mellitus with diabetic neuropathy, unspecified: Secondary | ICD-10-CM | POA: Diagnosis present

## 2016-01-09 DIAGNOSIS — Z7901 Long term (current) use of anticoagulants: Secondary | ICD-10-CM | POA: Diagnosis not present

## 2016-01-09 DIAGNOSIS — D696 Thrombocytopenia, unspecified: Secondary | ICD-10-CM | POA: Diagnosis present

## 2016-01-09 DIAGNOSIS — E1169 Type 2 diabetes mellitus with other specified complication: Secondary | ICD-10-CM | POA: Diagnosis present

## 2016-01-09 DIAGNOSIS — E870 Hyperosmolality and hypernatremia: Secondary | ICD-10-CM | POA: Diagnosis present

## 2016-01-09 DIAGNOSIS — E1142 Type 2 diabetes mellitus with diabetic polyneuropathy: Secondary | ICD-10-CM | POA: Diagnosis present

## 2016-01-09 DIAGNOSIS — R402214 Coma scale, best verbal response, none, 24 hours or more after hospital admission: Secondary | ICD-10-CM | POA: Diagnosis not present

## 2016-01-09 DIAGNOSIS — M170 Bilateral primary osteoarthritis of knee: Secondary | ICD-10-CM | POA: Diagnosis present

## 2016-01-09 DIAGNOSIS — I110 Hypertensive heart disease with heart failure: Secondary | ICD-10-CM | POA: Diagnosis present

## 2016-01-09 DIAGNOSIS — E1165 Type 2 diabetes mellitus with hyperglycemia: Secondary | ICD-10-CM | POA: Diagnosis present

## 2016-01-09 DIAGNOSIS — I2510 Atherosclerotic heart disease of native coronary artery without angina pectoris: Secondary | ICD-10-CM | POA: Diagnosis present

## 2016-01-09 DIAGNOSIS — E785 Hyperlipidemia, unspecified: Secondary | ICD-10-CM | POA: Diagnosis present

## 2016-01-09 DIAGNOSIS — Z951 Presence of aortocoronary bypass graft: Secondary | ICD-10-CM | POA: Diagnosis not present

## 2016-01-09 DIAGNOSIS — Z87891 Personal history of nicotine dependence: Secondary | ICD-10-CM

## 2016-01-09 DIAGNOSIS — I340 Nonrheumatic mitral (valve) insufficiency: Secondary | ICD-10-CM | POA: Diagnosis present

## 2016-01-09 DIAGNOSIS — Z7982 Long term (current) use of aspirin: Secondary | ICD-10-CM

## 2016-01-09 DIAGNOSIS — Z6830 Body mass index (BMI) 30.0-30.9, adult: Secondary | ICD-10-CM

## 2016-01-09 DIAGNOSIS — I10 Essential (primary) hypertension: Secondary | ICD-10-CM | POA: Diagnosis present

## 2016-01-09 DIAGNOSIS — I5043 Acute on chronic combined systolic (congestive) and diastolic (congestive) heart failure: Secondary | ICD-10-CM | POA: Diagnosis present

## 2016-01-09 DIAGNOSIS — I5042 Chronic combined systolic (congestive) and diastolic (congestive) heart failure: Secondary | ICD-10-CM | POA: Diagnosis not present

## 2016-01-09 DIAGNOSIS — I248 Other forms of acute ischemic heart disease: Secondary | ICD-10-CM | POA: Diagnosis present

## 2016-01-09 DIAGNOSIS — E876 Hypokalemia: Secondary | ICD-10-CM | POA: Diagnosis not present

## 2016-01-09 DIAGNOSIS — E872 Acidosis: Secondary | ICD-10-CM | POA: Diagnosis present

## 2016-01-09 DIAGNOSIS — I70211 Atherosclerosis of native arteries of extremities with intermittent claudication, right leg: Secondary | ICD-10-CM

## 2016-01-09 DIAGNOSIS — I4901 Ventricular fibrillation: Principal | ICD-10-CM | POA: Diagnosis present

## 2016-01-09 DIAGNOSIS — I252 Old myocardial infarction: Secondary | ICD-10-CM

## 2016-01-09 DIAGNOSIS — I701 Atherosclerosis of renal artery: Secondary | ICD-10-CM | POA: Diagnosis present

## 2016-01-09 DIAGNOSIS — Z9289 Personal history of other medical treatment: Secondary | ICD-10-CM

## 2016-01-09 DIAGNOSIS — I70213 Atherosclerosis of native arteries of extremities with intermittent claudication, bilateral legs: Secondary | ICD-10-CM | POA: Diagnosis present

## 2016-01-09 DIAGNOSIS — E669 Obesity, unspecified: Secondary | ICD-10-CM | POA: Diagnosis present

## 2016-01-09 DIAGNOSIS — J9600 Acute respiratory failure, unspecified whether with hypoxia or hypercapnia: Secondary | ICD-10-CM

## 2016-01-09 DIAGNOSIS — R509 Fever, unspecified: Secondary | ICD-10-CM | POA: Diagnosis present

## 2016-01-09 DIAGNOSIS — J9601 Acute respiratory failure with hypoxia: Secondary | ICD-10-CM | POA: Diagnosis not present

## 2016-01-09 DIAGNOSIS — I480 Paroxysmal atrial fibrillation: Secondary | ICD-10-CM | POA: Diagnosis present

## 2016-01-09 DIAGNOSIS — G931 Anoxic brain damage, not elsewhere classified: Secondary | ICD-10-CM | POA: Diagnosis not present

## 2016-01-09 DIAGNOSIS — G253 Myoclonus: Secondary | ICD-10-CM | POA: Diagnosis present

## 2016-01-09 DIAGNOSIS — J690 Pneumonitis due to inhalation of food and vomit: Secondary | ICD-10-CM | POA: Diagnosis not present

## 2016-01-09 DIAGNOSIS — Z79899 Other long term (current) drug therapy: Secondary | ICD-10-CM

## 2016-01-09 DIAGNOSIS — R0902 Hypoxemia: Secondary | ICD-10-CM

## 2016-01-09 DIAGNOSIS — IMO0002 Reserved for concepts with insufficient information to code with codable children: Secondary | ICD-10-CM | POA: Diagnosis present

## 2016-01-09 DIAGNOSIS — I462 Cardiac arrest due to underlying cardiac condition: Secondary | ICD-10-CM | POA: Diagnosis present

## 2016-01-09 DIAGNOSIS — R402314 Coma scale, best motor response, none, 24 hours or more after hospital admission: Secondary | ICD-10-CM | POA: Diagnosis not present

## 2016-01-09 DIAGNOSIS — I219 Acute myocardial infarction, unspecified: Secondary | ICD-10-CM

## 2016-01-09 DIAGNOSIS — I469 Cardiac arrest, cause unspecified: Secondary | ICD-10-CM | POA: Diagnosis not present

## 2016-01-09 DIAGNOSIS — E1151 Type 2 diabetes mellitus with diabetic peripheral angiopathy without gangrene: Secondary | ICD-10-CM | POA: Diagnosis present

## 2016-01-09 DIAGNOSIS — Z515 Encounter for palliative care: Secondary | ICD-10-CM | POA: Diagnosis not present

## 2016-01-09 DIAGNOSIS — I739 Peripheral vascular disease, unspecified: Secondary | ICD-10-CM | POA: Diagnosis present

## 2016-01-09 DIAGNOSIS — R402432 Glasgow coma scale score 3-8, at arrival to emergency department: Secondary | ICD-10-CM | POA: Diagnosis present

## 2016-01-09 DIAGNOSIS — I70219 Atherosclerosis of native arteries of extremities with intermittent claudication, unspecified extremity: Secondary | ICD-10-CM | POA: Diagnosis present

## 2016-01-09 DIAGNOSIS — Z789 Other specified health status: Secondary | ICD-10-CM

## 2016-01-09 DIAGNOSIS — I639 Cerebral infarction, unspecified: Secondary | ICD-10-CM

## 2016-01-09 LAB — CBC WITH DIFFERENTIAL/PLATELET
BASOS PCT: 0 %
Basophils Absolute: 0.1 10*3/uL (ref 0.0–0.1)
Eosinophils Absolute: 0.3 10*3/uL (ref 0.0–0.7)
Eosinophils Relative: 2 %
HEMATOCRIT: 47.1 % (ref 39.0–52.0)
HEMOGLOBIN: 15.3 g/dL (ref 13.0–17.0)
LYMPHS ABS: 4.2 10*3/uL — AB (ref 0.7–4.0)
LYMPHS PCT: 27 %
MCH: 29.7 pg (ref 26.0–34.0)
MCHC: 32.5 g/dL (ref 30.0–36.0)
MCV: 91.3 fL (ref 78.0–100.0)
MONO ABS: 0.6 10*3/uL (ref 0.1–1.0)
MONOS PCT: 4 %
NEUTROS ABS: 10.5 10*3/uL — AB (ref 1.7–7.7)
NEUTROS PCT: 67 %
Platelets: 180 10*3/uL (ref 150–400)
RBC: 5.16 MIL/uL (ref 4.22–5.81)
RDW: 14.9 % (ref 11.5–15.5)
WBC: 15.7 10*3/uL — ABNORMAL HIGH (ref 4.0–10.5)

## 2016-01-09 LAB — URINALYSIS, ROUTINE W REFLEX MICROSCOPIC
BILIRUBIN URINE: NEGATIVE
Glucose, UA: >1000 mg/dL — AB
Ketones, ur: NEGATIVE mg/dL
Leukocytes, UA: NEGATIVE
Nitrite: NEGATIVE
PH: 6 (ref 5.0–8.0)
Protein, ur: >300 mg/dL — AB
SPECIFIC GRAVITY, URINE: 1.025 (ref 1.005–1.030)

## 2016-01-09 LAB — I-STAT CG4 LACTIC ACID, ED: Lactic Acid, Venous: 7.02 mmol/L (ref 0.5–1.9)

## 2016-01-09 LAB — RAPID URINE DRUG SCREEN, HOSP PERFORMED
Amphetamines: NOT DETECTED
BARBITURATES: NOT DETECTED
Benzodiazepines: NOT DETECTED
Cocaine: NOT DETECTED
Opiates: NOT DETECTED
TETRAHYDROCANNABINOL: NOT DETECTED

## 2016-01-09 LAB — I-STAT ARTERIAL BLOOD GAS, ED
Acid-base deficit: 8 mmol/L — ABNORMAL HIGH (ref 0.0–2.0)
Acid-base deficit: 6 mmol/L — ABNORMAL HIGH (ref 0.0–2.0)
Acid-base deficit: 6 mmol/L — ABNORMAL HIGH (ref 0.0–2.0)
BICARBONATE: 22.8 meq/L (ref 20.0–24.0)
Bicarbonate: 20.7 mEq/L (ref 20.0–24.0)
Bicarbonate: 19.9 mEq/L — ABNORMAL LOW (ref 20.0–24.0)
O2 SAT: 90 %
O2 SAT: 99 %
O2 Saturation: 62 %
PCO2 ART: 53.1 mmHg — AB (ref 35.0–45.0)
PCO2 ART: 53 mmHg — AB (ref 35.0–45.0)
PCO2 ART: 36.2 mmHg (ref 35.0–45.0)
PH ART: 7.236 — AB (ref 7.350–7.450)
PH ART: 7.343 — AB (ref 7.350–7.450)
PO2 ART: 71 mmHg — AB (ref 80.0–100.0)
PO2 ART: 146 mmHg — AB (ref 80.0–100.0)
Patient temperature: 97
Patient temperature: 97
Patient temperature: 96.5
TCO2: 22 mmol/L (ref 0–100)
TCO2: 24 mmol/L (ref 0–100)
TCO2: 21 mmol/L (ref 0–100)
pH, Arterial: 7.194 — CL (ref 7.350–7.450)
pO2, Arterial: 37 mmHg — CL (ref 80.0–100.0)

## 2016-01-09 LAB — GLUCOSE, CAPILLARY
GLUCOSE-CAPILLARY: 173 mg/dL — AB (ref 65–99)
Glucose-Capillary: 296 mg/dL — ABNORMAL HIGH (ref 65–99)
Glucose-Capillary: 270 mg/dL — ABNORMAL HIGH (ref 65–99)
Glucose-Capillary: 206 mg/dL — ABNORMAL HIGH (ref 65–99)

## 2016-01-09 LAB — MRSA PCR SCREENING: MRSA by PCR: NEGATIVE

## 2016-01-09 LAB — I-STAT TROPONIN, ED: TROPONIN I, POC: 0.36 ng/mL — AB (ref 0.00–0.08)

## 2016-01-09 LAB — TRIGLYCERIDES: Triglycerides: 96 mg/dL (ref <150)

## 2016-01-09 LAB — COMPREHENSIVE METABOLIC PANEL
ALBUMIN: 3.7 g/dL (ref 3.5–5.0)
ALK PHOS: 62 U/L (ref 38–126)
ALT: 652 U/L — ABNORMAL HIGH (ref 17–63)
ANION GAP: 14 (ref 5–15)
AST: 472 U/L — ABNORMAL HIGH (ref 15–41)
BILIRUBIN TOTAL: 1.1 mg/dL (ref 0.3–1.2)
BUN: 31 mg/dL — AB (ref 6–20)
CALCIUM: 8.9 mg/dL (ref 8.9–10.3)
CO2: 22 mmol/L (ref 22–32)
Chloride: 103 mmol/L (ref 101–111)
Creatinine, Ser: 1.97 mg/dL — ABNORMAL HIGH (ref 0.61–1.24)
GFR calc Af Amer: 40 mL/min — ABNORMAL LOW (ref >60)
GFR, EST NON AFRICAN AMERICAN: 34 mL/min — AB (ref >60)
GLUCOSE: 266 mg/dL — AB (ref 65–99)
Potassium: 4.2 mmol/L (ref 3.5–5.1)
Sodium: 139 mmol/L (ref 135–145)
TOTAL PROTEIN: 6.5 g/dL (ref 6.5–8.1)

## 2016-01-09 LAB — URINE MICROSCOPIC-ADD ON

## 2016-01-09 LAB — I-STAT CHEM 8, ED
BUN: 40 mg/dL — ABNORMAL HIGH (ref 6–20)
CALCIUM ION: 1.09 mmol/L — AB (ref 1.12–1.23)
Chloride: 102 mmol/L (ref 101–111)
Creatinine, Ser: 1.9 mg/dL — ABNORMAL HIGH (ref 0.61–1.24)
GLUCOSE: 256 mg/dL — AB (ref 65–99)
HCT: 49 % (ref 39.0–52.0)
HEMOGLOBIN: 16.7 g/dL (ref 13.0–17.0)
POTASSIUM: 4.1 mmol/L (ref 3.5–5.1)
SODIUM: 140 mmol/L (ref 135–145)
TCO2: 22 mmol/L (ref 0–100)

## 2016-01-09 LAB — APTT: APTT: 33 s (ref 24–37)

## 2016-01-09 LAB — BRAIN NATRIURETIC PEPTIDE: B NATRIURETIC PEPTIDE 5: 56.4 pg/mL (ref 0.0–100.0)

## 2016-01-09 LAB — PROTIME-INR
INR: 2.17 — ABNORMAL HIGH (ref 0.00–1.49)
PROTHROMBIN TIME: 24 s — AB (ref 11.6–15.2)

## 2016-01-09 LAB — TROPONIN I: TROPONIN I: 0.26 ng/mL — AB (ref <0.03)

## 2016-01-09 LAB — CBG MONITORING, ED: GLUCOSE-CAPILLARY: 258 mg/dL — AB (ref 65–99)

## 2016-01-09 LAB — MAGNESIUM: MAGNESIUM: 2.6 mg/dL — AB (ref 1.7–2.4)

## 2016-01-09 MED ORDER — DEXTROSE 5 % IV SOLN
0.0000 ug/min | Freq: Once | INTRAVENOUS | Status: AC
  Administered 2016-01-09: 10 ug/min via INTRAVENOUS

## 2016-01-09 MED ORDER — INSULIN ASPART 100 UNIT/ML ~~LOC~~ SOLN: 0.0000 [IU] | Freq: Every day | SUBCUTANEOUS | Status: DC

## 2016-01-09 MED ORDER — PROPOFOL 1000 MG/100ML IV EMUL
0.0000 ug/kg/min | INTRAVENOUS | Status: DC
  Administered 2016-01-09: 10 ug/kg/min via INTRAVENOUS
  Administered 2016-01-09 (×2): 25 ug/kg/min via INTRAVENOUS
  Administered 2016-01-10: 5 ug/kg/min via INTRAVENOUS
  Filled 2016-01-09 (×4): qty 100

## 2016-01-09 MED ORDER — MIDAZOLAM HCL 2 MG/2ML IJ SOLN: 2.0000 mg | INTRAMUSCULAR | Status: DC | PRN

## 2016-01-09 MED ORDER — NOREPINEPHRINE BITARTRATE 1 MG/ML IV SOLN
0.0000 ug/min | INTRAVENOUS | Status: DC
  Administered 2016-01-09: 2 ug/min via INTRAVENOUS
  Filled 2016-01-09: qty 4

## 2016-01-09 MED ORDER — SUCCINYLCHOLINE CHLORIDE 20 MG/ML IJ SOLN
INTRAMUSCULAR | Status: DC | PRN
  Administered 2016-01-09: 150 mg via INTRAVENOUS

## 2016-01-09 MED ORDER — SODIUM CHLORIDE 0.9 % IV SOLN: INTRAVENOUS | Status: DC | PRN

## 2016-01-09 MED ORDER — SODIUM CHLORIDE 0.9% FLUSH: 10.0000 mL | INTRAVENOUS | Status: DC | PRN

## 2016-01-09 MED ORDER — ALBUTEROL SULFATE (2.5 MG/3ML) 0.083% IN NEBU: 2.5000 mg | INHALATION_SOLUTION | RESPIRATORY_TRACT | Status: DC | PRN

## 2016-01-09 MED ORDER — ONDANSETRON HCL 4 MG/2ML IJ SOLN
INTRAMUSCULAR | Status: AC
  Administered 2016-01-09: 4 mg
  Filled 2016-01-09: qty 2

## 2016-01-09 MED ORDER — FENTANYL CITRATE (PF) 100 MCG/2ML IJ SOLN
100.0000 ug | INTRAMUSCULAR | Status: DC | PRN
  Administered 2016-01-10 – 2016-01-11 (×2): 100 ug via INTRAVENOUS
  Filled 2016-01-09 (×2): qty 2

## 2016-01-09 MED ORDER — FENTANYL CITRATE (PF) 100 MCG/2ML IJ SOLN: 100.0000 ug | INTRAMUSCULAR | Status: DC | PRN

## 2016-01-09 MED ORDER — ASPIRIN 300 MG RE SUPP
300.0000 mg | Freq: Once | RECTAL | Status: AC
  Administered 2016-01-09: 300 mg via RECTAL
  Filled 2016-01-09: qty 1

## 2016-01-09 MED ORDER — SODIUM CHLORIDE 0.9 % IV BOLUS (SEPSIS)
1000.0000 mL | Freq: Once | INTRAVENOUS | Status: AC
  Administered 2016-01-09: 1000 mL via INTRAVENOUS

## 2016-01-09 MED ORDER — INSULIN ASPART 100 UNIT/ML ~~LOC~~ SOLN: 0.0000 [IU] | Freq: Three times a day (TID) | SUBCUTANEOUS | Status: DC

## 2016-01-09 MED ORDER — SODIUM CHLORIDE 0.9 % IV SOLN: 250.0000 mL | INTRAVENOUS | Status: DC | PRN

## 2016-01-09 MED ORDER — FAMOTIDINE IN NACL 20-0.9 MG/50ML-% IV SOLN
20.0000 mg | Freq: Two times a day (BID) | INTRAVENOUS | Status: DC
  Administered 2016-01-09 – 2016-01-10 (×4): 20 mg via INTRAVENOUS
  Filled 2016-01-09 (×5): qty 50

## 2016-01-09 MED ORDER — CHLORHEXIDINE GLUCONATE 0.12% ORAL RINSE (MEDLINE KIT)
15.0000 mL | Freq: Two times a day (BID) | OROMUCOSAL | Status: DC
  Administered 2016-01-09 – 2016-01-21 (×25): 15 mL via OROMUCOSAL

## 2016-01-09 MED ORDER — INSULIN ASPART 100 UNIT/ML ~~LOC~~ SOLN
0.0000 [IU] | SUBCUTANEOUS | Status: DC
  Administered 2016-01-09: 8 [IU] via SUBCUTANEOUS
  Administered 2016-01-09: 5 [IU] via SUBCUTANEOUS
  Administered 2016-01-09: 3 [IU] via SUBCUTANEOUS
  Administered 2016-01-09: 8 [IU] via SUBCUTANEOUS
  Administered 2016-01-10: 3 [IU] via SUBCUTANEOUS
  Administered 2016-01-10: 2 [IU] via SUBCUTANEOUS
  Administered 2016-01-10: 5 [IU] via SUBCUTANEOUS
  Administered 2016-01-10: 2 [IU] via SUBCUTANEOUS
  Administered 2016-01-10 – 2016-01-11 (×2): 5 [IU] via SUBCUTANEOUS
  Administered 2016-01-11: 8 [IU] via SUBCUTANEOUS
  Administered 2016-01-11: 3 [IU] via SUBCUTANEOUS

## 2016-01-09 MED ORDER — ACETAMINOPHEN 325 MG PO TABS
650.0000 mg | ORAL_TABLET | ORAL | Status: DC | PRN
  Administered 2016-01-09 – 2016-01-14 (×6): 650 mg via ORAL
  Filled 2016-01-09 (×6): qty 2

## 2016-01-09 MED ORDER — ANTISEPTIC ORAL RINSE SOLUTION (CORINZ)
7.0000 mL | OROMUCOSAL | Status: DC
  Administered 2016-01-09 – 2016-01-22 (×125): 7 mL via OROMUCOSAL

## 2016-01-09 MED ORDER — ETOMIDATE 2 MG/ML IV SOLN
INTRAVENOUS | Status: DC | PRN
  Administered 2016-01-09: 30 mg via INTRAVENOUS

## 2016-01-09 MED ORDER — EPINEPHRINE HCL 1 MG/ML IJ SOLN
30.0000 ug/min | INTRAVENOUS | Status: DC
  Administered 2016-01-09: 30 ug/min via INTRAVENOUS
  Filled 2016-01-09 (×2): qty 4

## 2016-01-09 MED ORDER — SODIUM CHLORIDE 0.9 % IV SOLN
INTRAVENOUS | Status: DC
  Administered 2016-01-09 – 2016-01-14 (×2): via INTRAVENOUS

## 2016-01-09 MED ORDER — NOREPINEPHRINE BITARTRATE 1 MG/ML IV SOLN
0.0000 ug/min | INTRAVENOUS | Status: DC
  Administered 2016-01-09: 35 ug/min via INTRAVENOUS
  Administered 2016-01-09: 20 ug/min via INTRAVENOUS
  Filled 2016-01-09 (×2): qty 16

## 2016-01-09 MED ORDER — HEPARIN SODIUM (PORCINE) 5000 UNIT/ML IJ SOLN
5000.0000 [IU] | Freq: Three times a day (TID) | INTRAMUSCULAR | Status: DC
  Administered 2016-01-09 – 2016-01-10 (×6): 5000 [IU] via SUBCUTANEOUS
  Filled 2016-01-09 (×6): qty 1

## 2016-01-09 MED ORDER — SODIUM CHLORIDE 0.9% FLUSH
10.0000 mL | Freq: Two times a day (BID) | INTRAVENOUS | Status: DC
  Administered 2016-01-09 – 2016-01-11 (×5): 10 mL
  Administered 2016-01-11: 20 mL
  Administered 2016-01-12 – 2016-01-19 (×16): 10 mL
  Administered 2016-01-20: 30 mL
  Administered 2016-01-20 – 2016-01-21 (×2): 10 mL
  Administered 2016-01-21: 30 mL

## 2016-01-09 NOTE — ED Notes (Signed)
2 rings given to wife

## 2016-01-09 NOTE — Procedures (Signed)
ELECTROENCEPHALOGRAM REPORT  Date of Study: 01/25/2016  Patient's Name: William Pacheco MRN: [id number] Date of Birth: 12-May-1951  Referring Provider: Simonne Maffucci, MD  Indication: 65 y/o male with a past medical history of CAD, CHF and ischemic cardiomyopathy intubated and sedated status post cardiac arrest.    Medications: 0.9 % sodium chloride infusion 0.9 % sodium chloride infusion acetaminophen (TYLENOL) tablet 650 mg albuterol (PROVENTIL) (2.5 MG/3ML) 0.083% nebulizer solution 2.5 mg antiseptic oral rinse solution (CORINZ) chlorhexidine gluconate (SAGE KIT) (PERIDEX) 0.12 % solution 15 mL famotidine (PEPCID) IVPB 20 mg premix fentaNYL (SUBLIMAZE) injection 100 mcg fentaNYL (SUBLIMAZE) injection 100 mcg heparin injection 5,000 Units insulin aspart (novoLOG) injection 0-15 Units midazolam (VERSED) injection 2 mg midazolam (VERSED) injection 2 mg norepinephrine (LEVOPHED) 4 mg in dextrose 5 % 250 mL (0.016 mg/mL) infusion propofol (DIPRIVAN) 1000 MG/100ML infusion sodium chloride flush (NS) 0.9 % injection 10-40 mL sodium chloride flush (NS) 0.9 % injection 10-40 mL  Technical Summary: This is a multichannel digital EEG recording, using the international 10-20 placement system with electrodes applied with paste and impedances below 5000 ohms.    Description: The EEG background is symmetric with diffuse slowing and suppression of the background, without a discernible posterior dominant rhythm.  No generalized or focal epileptiform discharges seen.  Stage II sleep is not seen.  Hyperventilation and photic stimulation were not performed.  ECG with some artifact but appears to reveal normal cardiac rate and rhythm.  Impression: This is an abnormal routine EEG due to generalized background slowing, indicative of diffuse cerebral dysfunction.  This is a nonspecific finding which may be due to toxic-metabolic, hypoxic, pharmacologic etiology or other diffuse physiologic  abnormality.  Adam R. Tomi Likens, DO

## 2016-01-09 NOTE — H&P (Signed)
PULMONARY / CRITICAL CARE MEDICINE   Name: William Pacheco MRN: [id number] DOB: [birth year]    ADMISSION DATE:  01/07/2016 CONSULTATION DATE:  01/06/2016  REFERRING MD:  Dr. Leonides Schanz  CHIEF COMPLAINT:  Found down, cardiac arrest  HISTORY OF PRESENT ILLNESS:   65 y/o male with a past medical history of CAD, CHF and ischemic cardiomyopathy was admitted on 12/31/2015 with cardiac arrest.  His wife provides history as he was encephalopathic and intubated on my exam.  She notes that he was in his usual state of health until this morning at 2:00 AM when she witnessed him coming to bed and then suddenly collapsing on their bed.  She said that she called 911 and was unable to perform CPR herself.  The first responders brought him to their livingroom and performed CPR for 1 hours and 29 minutes.  In the [facility] ER they noted him to be in shock on an epinephrine drip and he was intubated.  PCCM called for admission.  PAST MEDICAL HISTORY :  He  has a past medical history of CAD (coronary artery disease), native coronary artery (September 2008); S/P CABG x 6 (September 2008); Ischemic cardiomyopathy (September 2008); History of MI (myocardial infarction) (2012); PAF (paroxysmal atrial fibrillation) - initial presentation with RVR, and heart failure (2013); Chronic diastolic CHF (congestive heart failure), NYHA class 2 ([place]) (2009, 2011; 2013); Peripheral arterial disease ([place]) (2009, 2011; 2013); Renal artery stenosis, non-flow-limiting ([place]); DM (diabetes mellitus), type 2 with neurological complications ([place]); Diabetic peripheral neuropathy associated with type 2 diabetes mellitus ([place]); Hyperlipidemia LDL goal <70; Obesity (BMI 30.0-34.9); Hypertension associated with diabetes ([place]); Erectile dysfunction associated with type 2 diabetes mellitus ([place]) ( 2006); Ischemic mitral regurgitation ( 1980s); Thrombocytopenia (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  PAST  SURGICAL HISTORY: He  has past surgical history that includes Femoral endarterectomy (03-15-2008); Knee arthroscopy (Left, 1990); Tonsillectomy; Pleural effusion drainage (Right); Coronary artery bypass graft (03/2007); transthoracic echocardiogram (November 2014); transthoracic echocardiogram (07/26/2013); transthoracic echocardiogram (09/09/2013); NM MYOVIEW LTD (08/13/2013); Vascular surgery (Bilateral, 2006); Patch angioplasty (Left, 09/16/2013); Intraoperative arteriogram (Left, 09/16/2013); Groin dissection (Left, 09/16/2013); Endarterectomy popliteal (Left, 09/16/2013); abdominal aortagram (N/A, 08/30/2011); percutaneous stent intervention (Left, 08/30/2011); lower extremity angiogram (Left, 09/07/2013); NM MYOVIEW LTD (07/22/2014); and ABIs (December 2016).  No Known Allergies  No current facility-administered medications on file prior to encounter.   Current Outpatient Prescriptions on File Prior to Encounter  Medication Sig  . aspirin 81 MG tablet Take 81 mg by mouth daily.  Marland Kitchen atorvastatin (LIPITOR) 40 MG tablet TAKE 1 TABLET (40 MG TOTAL) BY MOUTH DAILY.  . Calcium Carb-Cholecalciferol (CALCIUM 1000 + D PO) Take 2 tablets by mouth daily.  . calcium carbonate (OS-CAL) 600 MG TABS tablet Take 600 mg by mouth 2 (two) times daily with a meal. Take two tablets daily  . carvedilol (COREG) 25 MG tablet Take 37.5 mg by mouth 2 (two) times daily with a meal. (1 and 1/2 tablets)  . cilostazol (PLETAL) 50 MG tablet TAKE 1 TABLET (50 MG TOTAL) BY MOUTH 2 (TWO) TIMES DAILY.  Marland Kitchen Cyanocobalamin (VITAMIN B-12) 2000 MCG TBCR Take 3 tablets by mouth daily.  . furosemide (LASIX) 40 MG tablet TAKE 80 MG IN AM ,40 MG IN PM ON MONDAYS AND FRIDAYS,OTHER DAYS TAKE 40 MG TWICE A DAY  . Glucosamine-Chondroit-Vit C-Mn (GLUCOSAMINE CHONDR 1500 COMPLX PO) Take 1,500 mg by mouth daily. Reported on 09/14/2015  . insulin regular human CONCENTRATED (HUMULIN R U-500  KWIKPEN) 500 UNIT/ML injection Inject 25 units at breakfast, 60  units at lunch and 40 units at breakfast (Patient taking differently: every morning. Inject 35 units at breakfast, 45 units at lunch and 35 units at breakfast)  . INVOKANA 300 MG TABS tablet TAKE 1 TABLET BY MOUTH DAILY BEFORE BREAKFAST  . KLOR-CON M10 10 MEQ tablet   . losartan (COZAAR) 100 MG tablet Take 100 mg by mouth daily.  . Magnesium Oxide 500 MG CAPS Take 1,000 mg by mouth 2 (two) times daily.   . metFORMIN (GLUCOPHAGE-XR) 500 MG 24 hr tablet TAKE (3) TABLETS BY MOUTH DAILY  . mupirocin ointment (BACTROBAN) 2 %   . nitroGLYCERIN (NITROSTAT) 0.4 MG SL tablet Place 1 tablet (0.4 mg total) under the tongue every 5 (five) minutes as needed for chest pain. MAX 3 doses.  Marland Kitchen omega-3 acid ethyl esters (LOVAZA) 1 G capsule Take 1,200 g by mouth 3 (three) times daily.   . ONE TOUCH ULTRA TEST test strip   . PATADAY 0.2 % SOLN Reported on 10/31/2015  . pregabalin (LYRICA) 100 MG capsule Take 300 mg by mouth 3 (three) times daily.   . ramipril (ALTACE) 10 MG capsule   . Rivaroxaban (XARELTO) 20 MG TABS tablet Take 1 tablet (20 mg total) by mouth daily with supper.  . traMADol (ULTRAM) 50 MG tablet Take 1 tablet (50 mg total) by mouth every 6 (six) hours as needed.  . vitamin C (ASCORBIC ACID) 500 MG tablet Take 1,500 mg by mouth daily.  . Vitamin D, Cholecalciferol, 1000 UNITS TABS Take 3,000 Units by mouth daily.  . vitamin E 400 UNIT capsule Take 1,200 Units by mouth daily.  . [DISCONTINUED] amLODipine (NORVASC) 10 MG tablet Take 10 mg by mouth daily.  . [DISCONTINUED] pioglitazone-metformin (ACTOPLUS MET) 15-850 MG per tablet Take 1 tablet by mouth 2 (two) times daily with a meal.  . [DISCONTINUED] simvastatin (ZOCOR) 40 MG tablet Take 40 mg by mouth every evening.    FAMILY HISTORY:  His is adopted.  SOCIAL HISTORY: He  reports that he quit smoking about 10 years ago. His smoking use included Cigarettes. He started smoking about 42 years ago. He smoked 0.50 packs per day. He has never used  smokeless tobacco. He reports that he does not drink alcohol or use illicit drugs.  REVIEW OF SYSTEMS:   Cannot obtain due to intubation  SUBJECTIVE:  As above  VITAL SIGNS: BP 115/76 mmHg  Pulse 64  Temp(Src) 97 F (36.1 C) (Rectal)  Resp 27  Ht 6' (1.829 m)  Wt 251 lb (113.853 kg)  BMI 34.03 kg/m2  SpO2 95%  HEMODYNAMICS:    VENTILATOR SETTINGS: Vent Mode:  [-] PRVC FiO2 (%):  [80 %-100 %] 80 % Set Rate:  [20 bmp-24 bmp] 20 bmp Vt Set:  [967 mL] 620 mL PEEP:  [10 cmH20-14 cmH20] 10 cmH20  INTAKE / OUTPUT:    PHYSICAL EXAMINATION: General:  On vent Neuro:  GCS 4, extensor response legs to pain, otherwise no response to pain anywhere else; brief myoclonus noted on my exam  HEENT:  NCAT, PERRL, ocular movements intact but non-purposeful Cardiovascular:  Irreg irreg, no clear murmur Lungs:  Rhonchi bilaterally, vent supported breaths Abdomen:  BS+, soft, nontender Musculoskeletal:  Normal bulk and tone Skin:  No rash or skin breakdown, cool to touch  LABS:  BMET  Recent Labs Lab 01/20/2016 0349 01/28/2016 0410  NA 139 140  K 4.2 4.1  CL 103 102  CO2  22  --   BUN 31* 40*  CREATININE 1.97* 1.90*  GLUCOSE 266* 256*    Electrolytes  Recent Labs Lab 01/16/2016 0349  CALCIUM 8.9  MG 2.6*    CBC  Recent Labs Lab 01/17/2016 0349 01/06/2016 0410  WBC 15.7*  --   HGB 15.3 16.7  HCT 47.1 49.0  PLT 180  --     Coag's  Recent Labs Lab 01/02/2016 0344  APTT 33  INR 2.17*    Sepsis Markers  Recent Labs Lab 01/29/2016 0413  LATICACIDVEN 7.02*    ABG  Recent Labs Lab 01/06/2016 0409 01/05/2016 0506 01/28/2016 0611  PHART 7.194* 7.236* 7.343*  PCO2ART 53.1* 53.0* 36.2  PO2ART 71.0* 37.0* 146.0*    Liver Enzymes  Recent Labs Lab 01/21/2016 0349  AST 472*  ALT 652*  ALKPHOS 62  BILITOT 1.1  ALBUMIN 3.7    Cardiac Enzymes  Recent Labs Lab 01/21/2016 0349  TROPONINI 0.26*    Glucose  Recent Labs Lab 01/21/2016 0341  GLUCAP 258*     Imaging Dg Chest Portable 1 View  01/06/2016  CLINICAL DATA:  Post intubation EXAM: PORTABLE CHEST 1 VIEW COMPARISON:  09/14/2013 FINDINGS: Endotracheal tube placed with tip measuring 4.3 cm above the carina. Enteric tube tip is off the field of view but below the left hemidiaphragm, likely in the distal stomach. Shallow inspiration. Heart size and pulmonary vascularity are normal. Perihilar infiltration is suggested on the right possibly due to asymmetric edema or pneumonia. No blunting of costophrenic angles. No pneumothorax. Postoperative changes in the mediastinum. IMPRESSION: Appliances appear in satisfactory position. Right perihilar infiltration or edema. Electronically Signed   By: Lucienne Capers M.D.   On: 01/01/2016 04:15     STUDIES:  CT head 7/10 >>  CULTURES:   ANTIBIOTICS:   SIGNIFICANT EVENTS:   LINES/TUBES: 7/10 ETT >> 7/10 R Fem CVL >>   DISCUSSION: 65 y/o male with an extensive cardiology history including CAD, Cardiomyopathy and atrial fibrillation admitted after a cardiac arrest which was PEA and at times V-fib requiring 6 electric shocks.  His total time of CPR noted to be well over one hour, so likelihood of a meaningful neurologic outcome is near zero.    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 P:   RASS goal: 0 No sedation EEG Consider neurology consultation  PULMONARY A: Acute respiratory failure with hypoxemia Acute pulmonary edema P:   Full vent support VAP prevention bundle  CARDIOVASCULAR A:  Cardiac arrest> initial rhythm uncertain Vfib Ischemic cardiomyopathy Atrial fibrillation CAD Troponin elevated Cardiogenic shock P:  Titrate epinephrine and levophed off for MAP > 65 as able Tele monitoring Let Dr. Ellyn Hack know (his patient)  RENAL A:   AKI due to cardiac arrest P:   Monitor BMET and UOP Replace electrolytes as  needed   GASTROINTESTINAL A:   No acute issues P:   Pepcid for stress ulcer prophylaxis No enteric feeding  HEMATOLOGIC A:   No issues P:  Monitor for bleeding  INFECTIOUS A:   No acute issues P:   Monitor for fever  ENDOCRINE A:   DM   P:   SSI  FAMILY  - Updates: Wife and son updated bedside by me on 7/10.  I explained to them that he will not survive this episode with meaningful neurologic function.  His exam this morning is consistent with severe neurologic injury after 90 minutes of down time  which predicts a very poor prognosis.  They would prefer to hold off on making him DNR until his daughter (physician) arrives from out of town.  I explained that I would not escalate care beyond current vasopressors and mechanical ventilation.  - Inter-disciplinary family meet or Palliative Care meeting due by: day 7  My cc time 40 minutes  Roselie Awkward, MD [place] [facility] Pager: [phone] Cell: [phone] After 3pm or if no response, call [phone]   01/16/2016, 6:43 AM

## 2016-01-09 NOTE — Progress Notes (Signed)
Pt is tachypneic.  Adjusted vent settings to pressure control and will add sedation.  EEG being set up.  Updated pt's family >> still waiting for daughter from [place] (she is a physician).  Additional CC time 27 minutes.  Coralyn Helling, MD [facility] Pulmonary/Critical Care 01-24-2016, 9:56 AM Pager:  [phone] After 3pm call: [phone]

## 2016-01-09 NOTE — Progress Notes (Signed)
   January 15, 2016 0500  Clinical Encounter Type  Visited With Family  Visit Type ED  Referral From Nurse  Consult/Referral To Chaplain  Spiritual Encounters  Spiritual Needs Emotional  Stress Factors  Family Stress Factors Health changes;Lack of knowledge;Major life changes  Chaplain responded to page, patient unresponsive receiving treatment, family escorted to consult room, provided therapeutic listening, spiritual presence, emotional support and hospitality. Chaplain will be available for follow up as needed.

## 2016-01-09 NOTE — Consult Note (Signed)
Reason for Consult:   Cardiac arrest  Requesting Physician: CCM Primary Cardiologist Dr Herbie Baltimore  HPI:   65 y/o AA male known to Dr Herbie Baltimore with CAD- s/p CABG in '08, PVD, combined CHF, ICM- EF 40%, LE PVD, PAF (CHADs VASc-4) on Xarelto, HTN, and HLD. LOV with Dr Herbie Baltimore was 08/23/15 and the pt was stable then. He had an abnormal Myoview in Jan 2016 and at that time it was elected not to proceed with coronary angiogram secondary to access (unable to use radial artery and scarring in his groin from PVD surgery) as well the unlikely possibility there would be disease that could be treated with PCI as opposed to continued medical Rx.  The pt had been doing well, his wife reports no recent chest pain.            Approximately 2 am today his wife witnessed him collapse onto the bed and she initiated CPR. EMS shocked the pt 6 times and performed CPR in the home for 1.5 hrs. The pt is now seen in the ED- intubated and unresponsive, currently on Levophed. His EKG is unremarkable. Dr Kendrick Fries feels the pt has little chance of surviving this episode. The family is waiting for 2 more children to come in from out of town. At this time there is no plan to escalate care.   PMHx:  Past Medical History  Diagnosis Date  . CAD (coronary artery disease), native coronary artery September 2008    Left main 50-60%, LAD 80% with D2 involved. RCA mid occlusion --> CABG  . 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 Ischemic cardiomyopathy September 2008    EF = 35-40%; THE 45-50% IN 2013 --> ECHO 03/03/13 - EF 40-45% LV: Moderate HK of basal-midlateral & inferior myocardium; c/w prior infarction in the distribution of the RCA or LCx. Mild-Mod MR with Calcified annulus. Mod TR  . History of MI (myocardial infarction) 2012    Noted by echocardiography, and nuclear stress testing. Known RCA occlusion prior to CABG  . PAF (paroxysmal atrial fibrillation) - initial  presentation with RVR, and heart failure 2013    On a beta blocker, calcium channel blocker & Xarelto. Lexiscan Myoview 11/08/11 LV was enlarged with no evidence of ischemia. There was a moderate sized, moderate to severe in intensity fixed defect in the inferior wall suggestive of scar with no reversible ischemia. There was a basal septal akinesis w/severe global hypokinesis. EF = 31% (Which is different from the [facility] EF)  . Chronic diastolic CHF (congestive heart failure), NYHA class 2 (HCC) 2009, 2011; 2013    Exacerbated by A. fib RVR, currently on Lasix.  . Peripheral arterial disease (HCC) 2009, 2011; 2013    2009, 2011; 2013  . Renal artery stenosis, non-flow-limiting (HCC)   . DM (diabetes mellitus), type 2 with neurological complications (HCC)     Peripheral neuropathy  . Diabetic peripheral neuropathy associated with type 2 diabetes mellitus (HCC)   . Hyperlipidemia LDL goal <70     On statin therapy  . Obesity (BMI 30.0-34.9)   . Hypertension associated with diabetes (HCC)   . Erectile dysfunction associated with type 2 diabetes mellitus (HCC)  2006  . Ischemic mitral regurgitation  1980s    Mild to moderate  . Thrombocytopenia (HCC)      chronic  . Osteoarthritis of both knees 1990    Status post left knee  a arthroscopy, currently Harding right knee  . History of hepatitis B 1980s  . Adopted     Adopted [S16.83]    Past Surgical History  Procedure Laterality Date  . Femoral endarterectomy  03-15-2008    Left EIA & CFA endarterectomy by Dr. Madilyn Fireman  . Knee arthroscopy Left 1990    For osteoarthritis pain.  . Tonsillectomy    . Pleural effusion drainage Right   . Coronary artery bypass graft  03/2007    CABG x 6 LIMA-LAD, left radial to diagonal 1, SVG to OM1-OM2 sequential, SVG to PDA-PLA sequential.  . Transthoracic echocardiogram  November 2014    EF 40-45%. Moderate hypokinesis of the basal and mid lateral and inferior myocardium. Mild to moderate MR. Mild LA dilation.    . Transthoracic echocardiogram  07/26/2013    In setting of acute illness: EF 15-20%, atrial fibrillation; diffuse hypokinesis with inferolateral abnormalities consistent with ischemic heart disease; grade 3 diastolic dysfunction/restrictive physiology - elevated LVEDP/LAP..  . Transthoracic echocardiogram  09/09/2013    40%. Septal hypokinesis. Moderate to severely dilated LV. Mild LVH and diffuse hypokinesis. Mild to moderate MR.  Marland Kitchen Nm myoview ltd  08/13/2013    Moderate LV dilation. Abnormal study with all high-risk features, unchanged from October 2014. Inferior defect likely representing nontransmural scar. Suggesting nonischemic cardiopathy superimposed on known ischemic cardiomyopathy.; Severe global hypokinesis with EF of roughly 31%.  . Vascular surgery Bilateral 2006    pt reports about 4 vascular surgeries starting in 2006 and last was in 2013.  Marland Kitchen Patch angioplasty Left 09/16/2013    Procedure: PATCH ANGIOPLASTY OF LEFT BELOW THE KNEE POPLITEAL ARTERY;  Surgeon: Fransisco Hertz, MD;  Location: [facility] OR;  Service: Vascular;  Laterality: Left;  . Intraoperative arteriogram Left 09/16/2013    Procedure: INTRA OPERATIVE ARTERIOGRAM;  Surgeon: Fransisco Hertz, MD;  Location: [facility] OR;  Service: Vascular;  Laterality: Left;  . Groin dissection Left 09/16/2013    Procedure: GROIN EXPLORATION;  Surgeon: Fransisco Hertz, MD;  Location: [facility] OR;  Service: Vascular;  Laterality: Left;  . Endarterectomy popliteal Left 09/16/2013    Procedure: ENDARTERECTOMYOF LEFT POPLITEAL ARTERY;  Surgeon: Fransisco Hertz, MD;  Location: [facility] OR;  Service: Vascular;  Laterality: Left;  . Abdominal aortagram N/A 08/30/2011    Procedure: ABDOMINAL Ronny Flurry;  Surgeon: Fransisco Hertz, MD;  Location: [facility] CATH LAB;  Service: Cardiovascular;  Laterality: N/A;  . Percutaneous stent intervention Left 08/30/2011    Procedure: PERCUTANEOUS STENT INTERVENTION;  Surgeon: Fransisco Hertz, MD;  Location: [facility] CATH LAB;  Service: Cardiovascular;  Laterality:  Left;  . Lower extremity angiogram Left 09/07/2013    Procedure: LOWER EXTREMITY ANGIOGRAM;  Surgeon: Fransisco Hertz, MD;  Location: [facility] CATH LAB;  Service: Cardiovascular;  Laterality: Left;  . Nm myoview ltd  07/22/2014    EF ~35%. High-Risk study do to moderate size, medial intensity partially reversible defect consistent with prior infave High-Risk do to reduced LV function; inferior akinesia and global hypokinesis   . Abis  December 2016    Are ABI 0.78 = moderate arterial occlusive disease; L ABI 0.51 = moderate-severe occlusive arterial disease    SOCHx:  reports that he quit smoking about 10 years ago. His smoking use included Cigarettes. He started smoking about 42 years ago. He smoked 0.50 packs per day. He has never used smokeless tobacco. He reports that he does not drink alcohol or use illicit drugs.  FAMHx: Family History  Problem Relation Age of  Onset  . Adopted: Yes  . Cancer Mother     ALLERGIES: No Known Allergies  ROS: Review of Systems: unobtainable   HOME MEDICATIONS: Prior to Admission medications   Medication Sig Start Date End Date Taking? Authorizing Provider  aspirin 81 MG tablet Take 81 mg by mouth daily.    Historical Provider, MD  atorvastatin (LIPITOR) 40 MG tablet TAKE 1 TABLET (40 MG TOTAL) BY MOUTH DAILY. 06/20/15   Marykay Lex, MD  Calcium Carb-Cholecalciferol (CALCIUM 1000 + D PO) Take 2 tablets by mouth daily.    Historical Provider, MD  calcium carbonate (OS-CAL) 600 MG TABS tablet Take 600 mg by mouth 2 (two) times daily with a meal. Take two tablets daily    Historical Provider, MD  carvedilol (COREG) 25 MG tablet Take 37.5 mg by mouth 2 (two) times daily with a meal. (1 and 1/2 tablets) 07/28/13   Christiane Ha, MD  cilostazol (PLETAL) 50 MG tablet TAKE 1 TABLET (50 MG TOTAL) BY MOUTH 2 (TWO) TIMES DAILY. 11/04/15   Reather Littler, MD  Cyanocobalamin (VITAMIN B-12) 2000 MCG TBCR Take 3 tablets by mouth daily.    Historical Provider, MD    furosemide (LASIX) 40 MG tablet TAKE 80 MG IN AM ,40 MG IN PM ON MONDAYS AND FRIDAYS,OTHER DAYS TAKE 40 MG TWICE A DAY 08/23/15   Marykay Lex, MD  Glucosamine-Chondroit-Vit C-Mn (GLUCOSAMINE CHONDR 1500 COMPLX PO) Take 1,500 mg by mouth daily. Reported on 09/14/2015    Historical Provider, MD  insulin regular human CONCENTRATED (HUMULIN R U-500 KWIKPEN) 500 UNIT/ML injection Inject 25 units at breakfast, 60 units at lunch and 40 units at breakfast Patient taking differently: every morning. Inject 35 units at breakfast, 45 units at lunch and 35 units at breakfast 04/07/15   Reather Littler, MD  INVOKANA 300 MG TABS tablet TAKE 1 TABLET BY MOUTH DAILY BEFORE BREAKFAST 02/09/15   Reather Littler, MD  KLOR-CON M10 10 MEQ tablet  10/01/13   Historical Provider, MD  losartan (COZAAR) 100 MG tablet Take 100 mg by mouth daily.    Historical Provider, MD  Magnesium Oxide 500 MG CAPS Take 1,000 mg by mouth 2 (two) times daily.     Historical Provider, MD  metFORMIN (GLUCOPHAGE-XR) 500 MG 24 hr tablet TAKE (3) TABLETS BY MOUTH DAILY 10/18/15   Reather Littler, MD  mupirocin ointment (BACTROBAN) 2 %  09/14/13   Historical Provider, MD  nitroGLYCERIN (NITROSTAT) 0.4 MG SL tablet Place 1 tablet (0.4 mg total) under the tongue every 5 (five) minutes as needed for chest pain. MAX 3 doses. 10/11/15   Marykay Lex, MD  omega-3 acid ethyl esters (LOVAZA) 1 G capsule Take 1,200 g by mouth 3 (three) times daily.     Historical Provider, MD  ONE TOUCH ULTRA TEST test strip  10/29/15   Historical Provider, MD  PATADAY 0.2 % SOLN Reported on 10/31/2015 02/14/15   Historical Provider, MD  pregabalin (LYRICA) 100 MG capsule Take 300 mg by mouth 3 (three) times daily.     Historical Provider, MD  ramipril (ALTACE) 10 MG capsule  08/16/14   Historical Provider, MD  Rivaroxaban (XARELTO) 20 MG TABS tablet Take 1 tablet (20 mg total) by mouth daily with supper. 07/28/13   Christiane Ha, MD  traMADol (ULTRAM) 50 MG tablet Take 1 tablet (50 mg  total) by mouth every 6 (six) hours as needed. 10/16/13   Fransisco Hertz, MD  vitamin C (ASCORBIC ACID) 500  MG tablet Take 1,500 mg by mouth daily.    Historical Provider, MD  Vitamin D, Cholecalciferol, 1000 UNITS TABS Take 3,000 Units by mouth daily.    Historical Provider, MD  vitamin E 400 UNIT capsule Take 1,200 Units by mouth daily.    Historical Provider, MD    HOSPITAL MEDICATIONS: I have reviewed the patient's current medications.  VITALS: Blood pressure 99/68, pulse 73, temperature 97 F (36.1 C), temperature source Rectal, resp. rate 25, height 6' (1.829 m), weight 251 lb (113.853 kg), SpO2 93 %.  PHYSICAL EXAM: General appearance: moderately obese and intubated Neck: no JVD Lungs: diffuse rhonchi Heart: regular rate and rhythm Abdomen: obese, BS quiet Extremities: trace edema Pulses: 2+ and symmetric diminnished Skin: cool, moist Neurologic: intubated, unresponsive  LABS: Results for orders placed or performed during the hospital encounter of 02/05/2016 (from the past 24 hour(s))  CBG monitoring, ED     Status: Abnormal   Collection Time: 02/05/2016  3:41 AM  Result Value Ref Range   Glucose-Capillary 258 (H) 65 - 99 mg/dL  Brain natriuretic peptide     Status: None   Collection Time: February 05, 2016  3:44 AM  Result Value Ref Range   B Natriuretic Peptide 56.4 0.0 - 100.0 pg/mL  APTT     Status: None   Collection Time: 02-05-2016  3:44 AM  Result Value Ref Range   aPTT 33 24 - 37 seconds  Protime-INR     Status: Abnormal   Collection Time: 02/05/16  3:44 AM  Result Value Ref Range   Prothrombin Time 24.0 (H) 11.6 - 15.2 seconds   INR 2.17 (H) 0.00 - 1.49  CBC with Differential     Status: Abnormal   Collection Time: 2016/02/05  3:49 AM  Result Value Ref Range   WBC 15.7 (H) 4.0 - 10.5 K/uL   RBC 5.16 4.22 - 5.81 MIL/uL   Hemoglobin 15.3 13.0 - 17.0 g/dL   HCT 16.1 09.6 - 04.5 %   MCV 91.3 78.0 - 100.0 fL   MCH 29.7 26.0 - 34.0 pg   MCHC 32.5 30.0 - 36.0 g/dL   RDW  40.9 81.1 - 91.4 %   Platelets 180 150 - 400 K/uL   Neutrophils Relative % 67 %   Neutro Abs 10.5 (H) 1.7 - 7.7 K/uL   Lymphocytes Relative 27 %   Lymphs Abs 4.2 (H) 0.7 - 4.0 K/uL   Monocytes Relative 4 %   Monocytes Absolute 0.6 0.1 - 1.0 K/uL   Eosinophils Relative 2 %   Eosinophils Absolute 0.3 0.0 - 0.7 K/uL   Basophils Relative 0 %   Basophils Absolute 0.1 0.0 - 0.1 K/uL  Comprehensive metabolic panel     Status: Abnormal   Collection Time: 02/05/16  3:49 AM  Result Value Ref Range   Sodium 139 135 - 145 mmol/L   Potassium 4.2 3.5 - 5.1 mmol/L   Chloride 103 101 - 111 mmol/L   CO2 22 22 - 32 mmol/L   Glucose, Bld 266 (H) 65 - 99 mg/dL   BUN 31 (H) 6 - 20 mg/dL   Creatinine, Ser 7.82 (H) 0.61 - 1.24 mg/dL   Calcium 8.9 8.9 - 95.6 mg/dL   Total Protein 6.5 6.5 - 8.1 g/dL   Albumin 3.7 3.5 - 5.0 g/dL   AST 213 (H) 15 - 41 U/L   ALT 652 (H) 17 - 63 U/L   Alkaline Phosphatase 62 38 - 126 U/L   Total Bilirubin 1.1 0.3 -  1.2 mg/dL   GFR calc non Af Amer 34 (L) >60 mL/min   GFR calc Af Amer 40 (L) >60 mL/min   Anion gap 14 5 - 15  Troponin I     Status: Abnormal   Collection Time: 01/05/2016  3:49 AM  Result Value Ref Range   Troponin I 0.26 (HH) <0.03 ng/mL  Magnesium     Status: Abnormal   Collection Time: 01/01/2016  3:49 AM  Result Value Ref Range   Magnesium 2.6 (H) 1.7 - 2.4 mg/dL  I-stat troponin, ED     Status: Abnormal   Collection Time: 01/23/2016  4:08 AM  Result Value Ref Range   Troponin i, poc 0.36 (HH) 0.00 - 0.08 ng/mL   Comment NOTIFIED PHYSICIAN    Comment 3          I-Stat arterial blood gas, ED     Status: Abnormal   Collection Time: 01/21/2016  4:09 AM  Result Value Ref Range   pH, Arterial 7.194 (LL) 7.350 - 7.450   pCO2 arterial 53.1 (H) 35.0 - 45.0 mmHg   pO2, Arterial 71.0 (L) 80.0 - 100.0 mmHg   Bicarbonate 20.7 20.0 - 24.0 mEq/L   TCO2 22 0 - 100 mmol/L   O2 Saturation 90.0 %   Acid-base deficit 8.0 (H) 0.0 - 2.0 mmol/L   Patient temperature  97.0 F    Collection site RADIAL, ALLEN'S TEST ACCEPTABLE    Drawn by Operator    Sample type ARTERIAL    Comment NOTIFIED PHYSICIAN   I-stat chem 8, ed     Status: Abnormal   Collection Time: 01/04/2016  4:10 AM  Result Value Ref Range   Sodium 140 135 - 145 mmol/L   Potassium 4.1 3.5 - 5.1 mmol/L   Chloride 102 101 - 111 mmol/L   BUN 40 (H) 6 - 20 mg/dL   Creatinine, Ser 1.61 (H) 0.61 - 1.24 mg/dL   Glucose, Bld 096 (H) 65 - 99 mg/dL   Calcium, Ion 0.45 (L) 1.12 - 1.23 mmol/L   TCO2 22 0 - 100 mmol/L   Hemoglobin 16.7 13.0 - 17.0 g/dL   HCT 40.9 81.1 - 91.4 %  I-Stat CG4 Lactic Acid, ED     Status: Abnormal   Collection Time: 01/01/2016  4:13 AM  Result Value Ref Range   Lactic Acid, Venous 7.02 (HH) 0.5 - 1.9 mmol/L   Comment NOTIFIED PHYSICIAN   I-Stat arterial blood gas, ED     Status: Abnormal   Collection Time: 01/15/2016  5:06 AM  Result Value Ref Range   pH, Arterial 7.236 (L) 7.350 - 7.450   pCO2 arterial 53.0 (H) 35.0 - 45.0 mmHg   pO2, Arterial 37.0 (LL) 80.0 - 100.0 mmHg   Bicarbonate 22.8 20.0 - 24.0 mEq/L   TCO2 24 0 - 100 mmol/L   O2 Saturation 62.0 %   Acid-base deficit 6.0 (H) 0.0 - 2.0 mmol/L   Patient temperature 97.0 F    Collection site FEMORAL ARTERY    Drawn by MD    Sample type ARTERIAL    Comment NOTIFIED PHYSICIAN   Urinalysis, Routine w reflex microscopic (not at [facility])     Status: Abnormal   Collection Time: 01/14/2016  5:33 AM  Result Value Ref Range   Color, Urine YELLOW YELLOW   APPearance TURBID (A) CLEAR   Specific Gravity, Urine 1.025 1.005 - 1.030   pH 6.0 5.0 - 8.0   Glucose, UA >1000 (A) NEGATIVE mg/dL  Hgb urine dipstick LARGE (A) NEGATIVE   Bilirubin Urine NEGATIVE NEGATIVE   Ketones, ur NEGATIVE NEGATIVE mg/dL   Protein, ur >161 (A) NEGATIVE mg/dL   Nitrite NEGATIVE NEGATIVE   Leukocytes, UA NEGATIVE NEGATIVE  Urine rapid drug screen (hosp performed)     Status: None   Collection Time: 01/14/2016  5:33 AM  Result Value Ref Range    Opiates NONE DETECTED NONE DETECTED   Cocaine NONE DETECTED NONE DETECTED   Benzodiazepines NONE DETECTED NONE DETECTED   Amphetamines NONE DETECTED NONE DETECTED   Tetrahydrocannabinol NONE DETECTED NONE DETECTED   Barbiturates NONE DETECTED NONE DETECTED  Urine microscopic-add on     Status: Abnormal   Collection Time: 01/10/2016  5:33 AM  Result Value Ref Range   Squamous Epithelial / LPF 0-5 (A) NONE SEEN   WBC, UA 0-5 0 - 5 WBC/hpf   RBC / HPF 0-5 0 - 5 RBC/hpf   Bacteria, UA RARE (A) NONE SEEN   Casts GRANULAR CAST (A) NEGATIVE  I-Stat arterial blood gas, ED     Status: Abnormal   Collection Time: 01/16/2016  6:11 AM  Result Value Ref Range   pH, Arterial 7.343 (L) 7.350 - 7.450   pCO2 arterial 36.2 35.0 - 45.0 mmHg   pO2, Arterial 146.0 (H) 80.0 - 100.0 mmHg   Bicarbonate 19.9 (L) 20.0 - 24.0 mEq/L   TCO2 21 0 - 100 mmol/L   O2 Saturation 99.0 %   Acid-base deficit 6.0 (H) 0.0 - 2.0 mmol/L   Patient temperature 96.5 F    Collection site BRACHIAL ARTERY    Drawn by RT    Sample type ARTERIAL     EKG: NSR, no acute changes  IMAGING: Dg Chest Portable 1 View  01/10/2016  CLINICAL DATA:  Post intubation EXAM: PORTABLE CHEST 1 VIEW COMPARISON:  09/14/2013 FINDINGS: Endotracheal tube placed with tip measuring 4.3 cm above the carina. Enteric tube tip is off the field of view but below the left hemidiaphragm, likely in the distal stomach. Shallow inspiration. Heart size and pulmonary vascularity are normal. Perihilar infiltration is suggested on the right possibly due to asymmetric edema or pneumonia. No blunting of costophrenic angles. No pneumothorax. Postoperative changes in the mediastinum. IMPRESSION: Appliances appear in satisfactory position. Right perihilar infiltration or edema. Electronically Signed   By: Burman Nieves M.D.   On: 01/27/2016 04:15    IMPRESSION: Principal Problem:   Cardiac arrest - witnessed, out of hospital arrest Active Problems:   CAD- CABG x 6  9/08- Myoview 07/1024; Moderate Inf Infarct with very mild Peri-infarct ischemia   VF (ventricular fibrillation) -shocked 6 times   DM type 2, uncontrolled, with neuropathy (HCC)   Anticoagulation adequate, on Xarelto-CHADs VASc=3   Ischemic cardiomyopathy; EF roughly 40%   Chronic combined systolic and diastolic congestive heart failure, NYHA class 2 (HCC)   Atherosclerosis of native arteries of extremity with intermittent claudication (HCC)   PAD - R Fem-Pop, L SFA stent; recent left popliteal endarterectomy with patch angioplasty   Essential hypertension   Hyperlipidemia with target LDL less than 70   PAF (paroxysmal atrial fibrillation) (HCC)   RECOMMENDATION: I spoke with pt's wife and son. Dr Herbie Baltimore is out of the country, his partner will see.   Time Spent Directly with Patient: 30 minutes  Corine Shelter, [place]  [phone] beeper 01/14/2016, 7:26 AM

## 2016-01-09 NOTE — Progress Notes (Signed)
EEG Completed; Results Pending  

## 2016-01-09 NOTE — Progress Notes (Signed)
RT transported pt to CT & transported to ICU 2H with no complications noted.

## 2016-01-09 NOTE — ED Notes (Signed)
White colored ring, yellow colored ring removed and placed in denture cup

## 2016-01-09 NOTE — ED Notes (Signed)
Patient presents via EMS Wife stated he was on the bed and fell backwards voiced no complaints prior to falling backwards on the bed.  EMS adm 12 epi, Amio 300 and 150, shocked 4 times CBG 189 intubated with king airway  EMS reports excessive amount of vomitus  Infused via 16 left EJ   BP 113/74, HR 73 CAP 30  Patient diaphoretic, pupils 2 and sluggish.

## 2016-01-09 NOTE — ED Provider Notes (Signed)
By signing my name below, I, Bethel Born, attest that this documentation has been prepared under the direction and in the presence of Avamae Dehaan N Gershom Brobeck, DO. Electronically Signed: Bethel Born, ED Scribe. Jan 19, 2016. 4:07 AM.  TIME SEEN: 3:32 AM  CHIEF COMPLAINT: Cardiac Arrest  LEVEL V CAVEAT DUE TO THE ACUITY OF THE PRESENTING CONDITION  HPI: Brought in by EMS post CPR on an epinephrine drip, William Pacheco is a 65 y.o. male with PMHx of CAD s/p CABG in 2008, ischemic cardiomyopathy, PAF on Xarelto, HTN, DM, HLD, and peripheral artery disease who presents to the Emergency Department for evaluation after cardiac arrest. Per EMS report the patient's wife witnessed him collapse to the floor at home tonight. He had made no complaints prior to the event. For EMS the pt was noted to be in v-fib and shocked 6 times. He had a total of 15 rounds of epinephrine before a drip was started. Also given a 300 mg and 100 mg IV amiodarone bolus. Patient went in between ventricular fibrillation, PEA and normal sinus rhythm. Had ROSC x 2.  Started CPR at 2am.  Brooke Dare airway in place.  CBG 180s.  No sign of STEMI on EMS rhythm strips.    PAST MEDICAL HISTORY/PAST SURGICAL HISTORY:  Past Medical History  Diagnosis Date  . CAD (coronary artery disease), native coronary artery September 2008    Left main 50-60%, LAD 80% with D2 involved. RCA mid occlusion --> CABG  . 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 Ischemic cardiomyopathy September 2008    EF = 35-40%; THE 45-50% IN 2013 --> ECHO 03/03/13 - EF 40-45% LV: Moderate HK of basal-midlateral & inferior myocardium; c/w prior infarction in the distribution of the RCA or LCx. Mild-Mod MR with Calcified annulus. Mod TR  . History of MI (myocardial infarction) 2012    Noted by echocardiography, and nuclear stress testing. Known RCA occlusion prior to CABG  . PAF (paroxysmal atrial fibrillation) - initial presentation with  RVR, and heart failure 2013    On a beta blocker, calcium channel blocker & Xarelto. Lexiscan Myoview 11/08/11 LV was enlarged with no evidence of ischemia. There was a moderate sized, moderate to severe in intensity fixed defect in the inferior wall suggestive of scar with no reversible ischemia. There was a basal septal akinesis w/severe global hypokinesis. EF = 31% (Which is different from the [facility] EF)  . Chronic diastolic CHF (congestive heart failure), NYHA class 2 (HCC) 2009, 2011; 2013    Exacerbated by A. fib RVR, currently on Lasix.  . Peripheral arterial disease (HCC) 2009, 2011; 2013    2009, 2011; 2013  . Renal artery stenosis, non-flow-limiting (HCC)   . DM (diabetes mellitus), type 2 with neurological complications (HCC)     Peripheral neuropathy  . Diabetic peripheral neuropathy associated with type 2 diabetes mellitus (HCC)   . Hyperlipidemia LDL goal <70     On statin therapy  . Obesity (BMI 30.0-34.9)   . Hypertension associated with diabetes (HCC)   . Erectile dysfunction associated with type 2 diabetes mellitus (HCC)  2006  . Ischemic mitral regurgitation  1980s    Mild to moderate  . Thrombocytopenia (HCC)      chronic  . Osteoarthritis of both knees 1990    Status post left knee a arthroscopy, currently [place] right knee  . History of hepatitis B 1980s  . Adopted     Adopted [V68.89]  MEDICATIONS:  Prior to Admission medications   Medication Sig Start Date End Date Taking? Authorizing Provider  aspirin 81 MG tablet Take 81 mg by mouth daily.    Historical Provider, MD  atorvastatin (LIPITOR) 40 MG tablet TAKE 1 TABLET (40 MG TOTAL) BY MOUTH DAILY. 06/20/15   Marykay Lex, MD  Calcium Carb-Cholecalciferol (CALCIUM 1000 + D PO) Take 2 tablets by mouth daily.    Historical Provider, MD  calcium carbonate (OS-CAL) 600 MG TABS tablet Take 600 mg by mouth 2 (two) times daily with a meal. Take two tablets daily    Historical Provider, MD  carvedilol (COREG) 25 MG  tablet Take 37.5 mg by mouth 2 (two) times daily with a meal. (1 and 1/2 tablets) 07/28/13   Christiane Ha, MD  cilostazol (PLETAL) 50 MG tablet TAKE 1 TABLET (50 MG TOTAL) BY MOUTH 2 (TWO) TIMES DAILY. 11/04/15   Reather Littler, MD  Cyanocobalamin (VITAMIN B-12) 2000 MCG TBCR Take 3 tablets by mouth daily.    Historical Provider, MD  furosemide (LASIX) 40 MG tablet TAKE 80 MG IN AM ,40 MG IN PM ON MONDAYS AND FRIDAYS,OTHER DAYS TAKE 40 MG TWICE A DAY 08/23/15   Marykay Lex, MD  Glucosamine-Chondroit-Vit C-Mn (GLUCOSAMINE CHONDR 1500 COMPLX PO) Take 1,500 mg by mouth daily. Reported on 09/14/2015    Historical Provider, MD  insulin regular human CONCENTRATED (HUMULIN R U-500 KWIKPEN) 500 UNIT/ML injection Inject 25 units at breakfast, 60 units at lunch and 40 units at breakfast Patient taking differently: every morning. Inject 35 units at breakfast, 45 units at lunch and 35 units at breakfast 04/07/15   Reather Littler, MD  INVOKANA 300 MG TABS tablet TAKE 1 TABLET BY MOUTH DAILY BEFORE BREAKFAST 02/09/15   Reather Littler, MD  KLOR-CON M10 10 MEQ tablet  10/01/13   Historical Provider, MD  losartan (COZAAR) 100 MG tablet Take 100 mg by mouth daily.    Historical Provider, MD  Magnesium Oxide 500 MG CAPS Take 1,000 mg by mouth 2 (two) times daily.     Historical Provider, MD  metFORMIN (GLUCOPHAGE-XR) 500 MG 24 hr tablet TAKE (3) TABLETS BY MOUTH DAILY 10/18/15   Reather Littler, MD  mupirocin ointment (BACTROBAN) 2 %  09/14/13   Historical Provider, MD  nitroGLYCERIN (NITROSTAT) 0.4 MG SL tablet Place 1 tablet (0.4 mg total) under the tongue every 5 (five) minutes as needed for chest pain. MAX 3 doses. 10/11/15   Marykay Lex, MD  omega-3 acid ethyl esters (LOVAZA) 1 G capsule Take 1,200 g by mouth 3 (three) times daily.     Historical Provider, MD  ONE TOUCH ULTRA TEST test strip  10/29/15   Historical Provider, MD  PATADAY 0.2 % SOLN Reported on 10/31/2015 02/14/15   Historical Provider, MD  pregabalin (LYRICA) 100 MG  capsule Take 300 mg by mouth 3 (three) times daily.     Historical Provider, MD  ramipril (ALTACE) 10 MG capsule  08/16/14   Historical Provider, MD  Rivaroxaban (XARELTO) 20 MG TABS tablet Take 1 tablet (20 mg total) by mouth daily with supper. 07/28/13   Christiane Ha, MD  traMADol (ULTRAM) 50 MG tablet Take 1 tablet (50 mg total) by mouth every 6 (six) hours as needed. 10/16/13   Fransisco Hertz, MD  vitamin C (ASCORBIC ACID) 500 MG tablet Take 1,500 mg by mouth daily.    Historical Provider, MD  Vitamin D, Cholecalciferol, 1000 UNITS TABS Take 3,000 Units by mouth  daily.    Historical Provider, MD  vitamin E 400 UNIT capsule Take 1,200 Units by mouth daily.    Historical Provider, MD    ALLERGIES:  No Known Allergies  SOCIAL HISTORY:  Social History  Substance Use Topics  . Smoking status: Former Smoker -- 0.50 packs/day    Types: Cigarettes    Start date: 07/02/1973    Quit date: 07/02/2005  . Smokeless tobacco: Never Used  . Alcohol Use: No    FAMILY HISTORY: Family History  Problem Relation Age of Onset  . Adopted: Yes  . Cancer Mother     EXAM: BP 101/70 mmHg  Pulse 69  Resp 26  Ht 6' (1.829 m)  Wt 251 lb (113.853 kg)  BMI 34.03 kg/m2  SpO2 100% CONSTITUTIONAL: GCS of 3 HEAD: Normocephalic EYES: Conjunctivae clear, pupils 2 mm and minimally responsive bilaterally ENT: normal nose; no rhinorrhea; moist mucous membranes, King airway in place  With large amounts of brown emesis present NECK: Supple, no meningismus, no LAD  CARD: RRR; S1 and S2 appreciated; no murmurs, no clicks, no rubs, no gallops RESP: spontaneous respirations, rhonchorous breath sounds bilaterally, King airway in place ABD/GI: Abdomen is distended, soft BACK:  The back appears normal  EXT: Extremities are cool to touch, difficult to palpate radial or DP pulses bilaterally, no edema SKIN: Normal color for age and race; skin is cool to touch NEURO: GCS 3   MEDICAL DECISION MAKING: Patient here  with cardiac arrest that was witnessed at home. Bystander CPR was not started but far department arrived quickly. It was found to be in ventricular fibrillation with EMS. Was defibrillated a total of 6 times. Was given a total of 450 mg of IV amiodarone and 15 rounds of epinephrine. CPR was started at 2 AM. He did have one episode of return of spontaneous circulation with EMS and then lost pulses again. Had another episode of return of spontaneous or duration prior to arrival. Downtime between 1 hour and 1-1/2 hours. On his arrival to the emergency department, patient is breathing on his own. He has a Scientist, clinical (histocompatibility and immunogenetics) airway in place. He is in a normal sinus rhythm. He is hypotensive. We exchanged his Brooke Dare airway for an endotracheal tube. He is on an epinephrine drip and we have added norepinephrine. Will consult cardiology, intensivist. EKG shows nonspecific ST changes but does not meet STEMI criteria.  ED PROGRESS: Patient's chest x-ray shows no infiltrate, pneumothorax or edema. Patient has metabolic acidosis, elevated lactate. Troponin mildly elevated. Potassium is 4.2, magnesium elevated at 2.6.    Of note, patient's ABG that was documented at 5:06 AM is actually a VBG from his femoral line.  Patient's wife and son have been updated.   5:45 AM  D/w Dr. Arsenio Loader with [facility].  They will see patient for admission. He recommends cooling the patient to 36. Patient is currently at 35.8. We will also wean patient's epinephrine and increase Levophed as needed.   6:00 AM  D/w Dr. Tarri Glenn, cardiology fellow on-call. They will see the patient in consult.   EKG Interpretation  Date/Time:  Monday January 09 2016 03:42:38 EDT Ventricular Rate:  74 PR Interval:    QRS Duration: 121 QT Interval:  419 QTC Calculation: 465 R Axis:   -21 Text Interpretation:  Sinus rhythm Borderline prolonged PR interval Nonspecific intraventricular conduction delay Confirmed by London Tarnowski,  DO, Alanah Sakuma ([id number]) on 01/01/2016 4:09:33 AM         CRITICAL CARE Performed by: Raelyn Number  Total critical care time: 60 minutes  Critical care time was exclusive of separately billable procedures and treating other patients.  Critical care was necessary to treat or prevent imminent or life-threatening deterioration.  Critical care was time spent personally by me on the following activities: development of treatment plan with patient and/or surrogate as well as nursing, discussions with consultants, evaluation of patient's response to treatment, examination of patient, obtaining history from patient or surrogate, ordering and performing treatments and interventions, ordering and review of laboratory studies, ordering and review of radiographic studies, pulse oximetry and re-evaluation of patient's condition.   INTUBATION Performed by: Raelyn Number  Required items: required blood products, implants, devices, and special equipment available Patient identity confirmed: provided demographic data and hospital-assigned identification number Time out: Immediately prior to procedure a "time out" was called to verify the correct patient, procedure, equipment, support staff and site/side marked as required.  Indications: Cardiac arrest   Intubation method: Glidescope Laryngoscopy   Preoxygenation: BVM, King  Sedatives: 30 mg IV Etomidate Paralytic: 150 mg IV Succinylcholine  Tube Size: 7.5 cuffed  Post-procedure assessment: chest rise and ETCO2 monitor Breath sounds: equal and absent over the epigastrium Tube secured with: ETT holder Chest x-ray interpreted by radiologist and me.  Chest x-ray findings: endotracheal tube in appropriate position  Patient tolerated the procedure well with no immediate complications.    CENTRAL LINE Performed by: Raelyn Number Consent: The procedure was performed in an emergent situation. Required items: required blood products, implants, devices, and special equipment  available Patient identity confirmed: arm band and provided demographic data Time out: Immediately prior to procedure a "time out" was called to verify the correct patient, procedure, equipment, support staff and site/side marked as required. Indications: vascular access Anesthesia: local infiltration Local anesthetic: lidocaine 1% with epinephrine Anesthetic total: 3 ml Patient sedated: no Preparation: skin prepped with 2% chlorhexidine Skin prep agent dried: skin prep agent completely dried prior to procedure Sterile barriers: all five maximum sterile barriers used - cap, mask, sterile gown, sterile gloves, and large sterile sheet Hand hygiene: hand hygiene performed prior to central venous catheter insertion  Location details: Right femoral vein   Catheter type: triple lumen Catheter size: 8 Fr Pre-procedure: landmarks identified Ultrasound guidance: Yes  Successful placement: yes Post-procedure: line sutured and dressing applied Assessment: blood return through all parts, free fluid flow, placement verified by x-ray and no pneumothorax on x-ray Patient tolerance: Patient tolerated the procedure well with no immediate complications.  I personally performed the services described in this documentation, which was scribed in my presence. The recorded information has been reviewed and is accurate.      Layla Maw Mylei Brackeen, DO 22-Jan-2016 [phone]

## 2016-01-09 NOTE — ED Notes (Signed)
Family at bedside. 

## 2016-01-09 NOTE — ED Notes (Signed)
Ice packs placed to left groin and bilateral axilla

## 2016-01-09 NOTE — ED Notes (Signed)
Paged spiritual care to request an Manfred Shirts priest per family request. Lunette Stands will page priest for the family.

## 2016-01-10 DIAGNOSIS — J9601 Acute respiratory failure with hypoxia: Secondary | ICD-10-CM | POA: Insufficient documentation

## 2016-01-10 DIAGNOSIS — G9340 Encephalopathy, unspecified: Secondary | ICD-10-CM | POA: Insufficient documentation

## 2016-01-10 LAB — BASIC METABOLIC PANEL
ANION GAP: 10 (ref 5–15)
BUN: 41 mg/dL — ABNORMAL HIGH (ref 6–20)
CALCIUM: 7 mg/dL — AB (ref 8.9–10.3)
CHLORIDE: 113 mmol/L — AB (ref 101–111)
CO2: 19 mmol/L — AB (ref 22–32)
Creatinine, Ser: 2.42 mg/dL — ABNORMAL HIGH (ref 0.61–1.24)
GFR calc non Af Amer: 27 mL/min — ABNORMAL LOW (ref >60)
GFR, EST AFRICAN AMERICAN: 31 mL/min — AB (ref >60)
GLUCOSE: 115 mg/dL — AB (ref 65–99)
Potassium: 3.7 mmol/L (ref 3.5–5.1)
Sodium: 142 mmol/L (ref 135–145)

## 2016-01-10 LAB — CBC
HEMATOCRIT: 41.9 % (ref 39.0–52.0)
HEMOGLOBIN: 14 g/dL (ref 13.0–17.0)
MCH: 28.7 pg (ref 26.0–34.0)
MCHC: 33.4 g/dL (ref 30.0–36.0)
MCV: 86 fL (ref 78.0–100.0)
Platelets: 153 10*3/uL (ref 150–400)
RBC: 4.87 MIL/uL (ref 4.22–5.81)
RDW: 15.3 % (ref 11.5–15.5)
WBC: 8.9 10*3/uL (ref 4.0–10.5)

## 2016-01-10 LAB — URINALYSIS, ROUTINE W REFLEX MICROSCOPIC
Bilirubin Urine: NEGATIVE
Glucose, UA: >1000 mg/dL — AB
KETONES UR: NEGATIVE mg/dL
LEUKOCYTES UA: NEGATIVE
NITRITE: NEGATIVE
PH: 5 (ref 5.0–8.0)
Protein, ur: 100 mg/dL — AB
SPECIFIC GRAVITY, URINE: 1.023 (ref 1.005–1.030)

## 2016-01-10 LAB — PHOSPHORUS
PHOSPHORUS: 3 mg/dL (ref 2.5–4.6)
Phosphorus: 3.1 mg/dL (ref 2.5–4.6)

## 2016-01-10 LAB — GLUCOSE, CAPILLARY
GLUCOSE-CAPILLARY: 146 mg/dL — AB (ref 65–99)
GLUCOSE-CAPILLARY: 114 mg/dL — AB (ref 65–99)
GLUCOSE-CAPILLARY: 237 mg/dL — AB (ref 65–99)
Glucose-Capillary: 118 mg/dL — ABNORMAL HIGH (ref 65–99)
Glucose-Capillary: 126 mg/dL — ABNORMAL HIGH (ref 65–99)
Glucose-Capillary: 152 mg/dL — ABNORMAL HIGH (ref 65–99)
Glucose-Capillary: 213 mg/dL — ABNORMAL HIGH (ref 65–99)

## 2016-01-10 LAB — URINE MICROSCOPIC-ADD ON

## 2016-01-10 LAB — PROCALCITONIN: PROCALCITONIN: 20.82 ng/mL

## 2016-01-10 LAB — TRIGLYCERIDES: Triglycerides: 88 mg/dL (ref <150)

## 2016-01-10 LAB — MAGNESIUM
MAGNESIUM: 2.1 mg/dL (ref 1.7–2.4)
Magnesium: 2.1 mg/dL (ref 1.7–2.4)

## 2016-01-10 MED ORDER — DEXTROSE 5 % IV SOLN
1.0000 g | Freq: Two times a day (BID) | INTRAVENOUS | Status: DC
  Administered 2016-01-10 (×2): 1 g via INTRAVENOUS
  Filled 2016-01-10 (×3): qty 1

## 2016-01-10 MED ORDER — DEXTROSE 5 % IV SOLN
2.0000 g | INTRAVENOUS | Status: DC
  Administered 2016-01-10 – 2016-01-12 (×3): 2 g via INTRAVENOUS
  Filled 2016-01-10 (×4): qty 2

## 2016-01-10 MED ORDER — VANCOMYCIN HCL 10 G IV SOLR
2000.0000 mg | Freq: Once | INTRAVENOUS | Status: AC
  Administered 2016-01-10: 2000 mg via INTRAVENOUS
  Filled 2016-01-10: qty 2000

## 2016-01-10 MED ORDER — VITAL HIGH PROTEIN PO LIQD
1000.0000 mL | ORAL | Status: DC
  Administered 2016-01-10: 1000 mL
  Administered 2016-01-10: 20:00:00
  Administered 2016-01-11 – 2016-01-13 (×4): 1000 mL
  Administered 2016-01-14
  Administered 2016-01-14 – 2016-01-18 (×5): 1000 mL
  Administered 2016-01-19 (×3)
  Administered 2016-01-19: 1000 mL
  Administered 2016-01-19 (×4)
  Administered 2016-01-20: 1000 mL
  Administered 2016-01-20 – 2016-01-21 (×7)

## 2016-01-10 MED ORDER — VANCOMYCIN HCL 10 G IV SOLR: 1250.0000 mg | INTRAVENOUS | Status: DC

## 2016-01-10 MED ORDER — VANCOMYCIN HCL IN DEXTROSE 1-5 GM/200ML-% IV SOLN: 1000.0000 mg | INTRAVENOUS | Status: DC

## 2016-01-10 MED ORDER — PRO-STAT SUGAR FREE PO LIQD
30.0000 mL | Freq: Two times a day (BID) | ORAL | Status: DC
  Administered 2016-01-10: 30 mL
  Filled 2016-01-10: qty 30

## 2016-01-10 MED ORDER — PRO-STAT SUGAR FREE PO LIQD
60.0000 mL | Freq: Three times a day (TID) | ORAL | Status: DC
  Administered 2016-01-10 – 2016-01-21 (×32): 60 mL
  Filled 2016-01-10 (×31): qty 60

## 2016-01-10 NOTE — Progress Notes (Signed)
Initial Nutrition Assessment  DOCUMENTATION CODES:   Obesity unspecified  INTERVENTION:   Vital High Protein @ 40 ml/hr 60 ml Prostat TID Provides: 1560 kcal, 174 grams protein, 802 ml free H2O, in 960 ml.    NUTRITION DIAGNOSIS:   Inadequate oral intake related to inability to eat as evidenced by NPO status.  GOAL:   Provide needs based on ASPEN/SCCM guidelines  MONITOR:   TF tolerance, Skin, Vent status  REASON FOR ASSESSMENT:   Consult Enteral/tube feeding initiation and management  ASSESSMENT:   65 y/o male with CAD, CHF had a cardiac arrest (PEA/Vfib) on 7/10 and received 1 hour, 29 minutes of CPR.   Patient is currently intubated on ventilator support MV: 13 ml L/min Temp (24hrs), Avg:99.6 F (37.6 C), Min:97.7 F (36.5 C), Max:101.3 F (38.5 C)  Labs reviewed: BUN/Cr 41/2.42 and increasing  CBG's: 114-126 Medications reviewed.  Unable to complete Nutrition-Focused physical exam at this time.  OG tube   Diet Order:  Diet NPO time specified  Skin:  Wound (see comment) (Chest burn from defibrilllator pad)  Last BM:  7/10  Height:   Ht Readings from Last 1 Encounters:  2016/02/08 6' (1.829 m)    Weight:   Wt Readings from Last 1 Encounters:  01/10/16 255 lb 8.2 oz (115.9 kg)    Ideal Body Weight:  80.9 kg  BMI:  Body mass index is 34.65 kg/(m^2).  Estimated Nutritional Needs:   Kcal:  1660-6004  Protein:  >/= 161 grams  Fluid:  > 1.5 L/day  EDUCATION NEEDS:   No education needs identified at this time  Kendell Bane RD, LDN, CNSC [phone] Pager [phone] After Hours Pager

## 2016-01-10 NOTE — Progress Notes (Signed)
   01/10/16 1300  Clinical Encounter Type  Visited With Family  Visit Type Follow-up  Referral From Chaplain  Consult/Referral To Chaplain  Spiritual Encounters  Spiritual Needs Emotional;Prayer  CHP visited with family offering emotional support, presence and prayer. Rodney Booze 01/10/2016

## 2016-01-10 NOTE — Progress Notes (Addendum)
Pharmacy Antibiotic Note  William Pacheco is a 65 y.o. male admitted on 01/29/2016 s/p cardiac arrest, now with fevers, possible urosepsis.  Pharmacy has been consulted for Vancomycin and Cefepime  Dosing. Adjusting cefepime based on worsening renal function and vancomycin based on weight.  Plan: Vancomycin 1250mg  IV q24h Cefepime 2g IV q24h Monitor culture data, renal function and clinical course VT at SS prn  Height: 6' (182.9 cm) Weight: 255 lb 8.2 oz (115.9 kg) IBW/kg (Calculated) : 77.6  Temp (24hrs), Avg:99.9 F (37.7 C), Min:97.9 F (36.6 C), Max:101.3 F (38.5 C)   Recent Labs Lab 01/21/2016 0349 01/14/2016 0410 01/18/2016 0413 01/10/16 0500  WBC 15.7*  --   --  8.9  CREATININE 1.97* 1.90*  --  2.42*  LATICACIDVEN  --   --  7.02*  --     Estimated Creatinine Clearance: 41.1 mL/min (by C-G formula based on Cr of 2.42).    No Known Allergies   Arlean Hopping. Newman Pies, PharmD, BCPS Clinical Pharmacist Pager [phone] 01/10/2016 11:04 AM

## 2016-01-10 NOTE — Progress Notes (Signed)
PULMONARY / CRITICAL CARE MEDICINE   Name: William Pacheco MRN: [id number] DOB: 1950/09/14    ADMISSION DATE:  01/04/2016 CONSULTATION DATE:  01/20/2016  REFERRING MD:  Dr. Elesa Massed  CHIEF COMPLAINT:  Found down, cardiac arrest  BRIEF: 65 y/o male with CAD, CHF had a cardiac arrest (PEA/Vfib) on 7/10 and received 1 hour, 29 minutes of CPR.     SUBJECTIVE:  Started on antibiotics yesterday  Some spontaneous movements  VITAL SIGNS: BP 106/63 mmHg  Pulse 94  Temp(Src) 99.1 F (37.3 C) (Core (Comment))  Resp 26  Ht 6' (1.829 m)  Wt 255 lb 8.2 oz (115.9 kg)  BMI 34.65 kg/m2  SpO2 100%  HEMODYNAMICS:    VENTILATOR SETTINGS: Vent Mode:  [-] PCV FiO2 (%):  [40 %-80 %] 40 % Set Rate:  [26 bmp] 26 bmp PEEP:  [10 cmH20] 10 cmH20 Plateau Pressure:  [20 cmH20-29 cmH20] 25 cmH20  INTAKE / OUTPUT: I/O last 3 completed shifts: In: 5897.3 [I.V.:5247.3; IV Piggyback:650] Out: 2090 [Urine:1590; Emesis/NG output:500]  PHYSICAL EXAMINATION: General:  On vent HENT: NCAT, ETT in place PULM: Few Rhonchi bilaterally, vent supported breaths CV: RRR, no murmur, gallop, rub GI: BS+, soft, nontender MSK: normal bulk, no bony abnormality Neuro: extensor response to touch, partially opens eyes to pain   LABS:  BMET  Recent Labs Lab 01/12/2016 0349 01/27/2016 0410 01/10/16 0500  NA 139 140 142  K 4.2 4.1 3.7  CL 103 102 113*  CO2 22  --  19*  BUN 31* 40* 41*  CREATININE 1.97* 1.90* 2.42*  GLUCOSE 266* 256* 115*    Electrolytes  Recent Labs Lab 01/07/2016 0349 01/10/16 0500  CALCIUM 8.9 7.0*  MG 2.6*  --     CBC  Recent Labs Lab 01/28/2016 0349 01/08/2016 0410 01/10/16 0500  WBC 15.7*  --  8.9  HGB 15.3 16.7 14.0  HCT 47.1 49.0 41.9  PLT 180  --  153    Coag's  Recent Labs Lab 01/17/2016 0344  APTT 33  INR 2.17*    Sepsis Markers  Recent Labs Lab 01/23/2016 0413 01/10/16 0209  LATICACIDVEN 7.02*  --   PROCALCITON  --  20.82    ABG  Recent Labs Lab  01/14/2016 0409 01/27/2016 0506 01/20/2016 0611  PHART 7.194* 7.236* 7.343*  PCO2ART 53.1* 53.0* 36.2  PO2ART 71.0* 37.0* 146.0*    Liver Enzymes  Recent Labs Lab 01/21/2016 0349  AST 472*  ALT 652*  ALKPHOS 62  BILITOT 1.1  ALBUMIN 3.7    Cardiac Enzymes  Recent Labs Lab 01/05/2016 0349  TROPONINI 0.26*    Glucose  Recent Labs Lab 01/21/2016 1145 01/03/2016 1544 01/17/2016 2115 01/10/16 0107 01/10/16 0451 01/10/16 0815  GLUCAP 270* 206* 173* 146* 118* 114*    Imaging No results found.   STUDIES:  CT head 7/10 >> no intracranial acute abnormality; paranasal sinus disease EEG 7/10 > diffuse slowing  CULTURES: 7/11 blood >  7/11 resp > GPC in pair  ANTIBIOTICS: 7/11 vanc >  7/11 cefepime>   SIGNIFICANT EVENTS:   LINES/TUBES: 7/10 ETT >> 7/10 R Fem CVL >>   DISCUSSION: 65 y/o male with an extensive cardiology history including CAD, Cardiomyopathy and atrial fibrillation admitted after a cardiac arrest which was PEA and at times V-fib requiring 6 electric shocks.  His total time of CPR noted to be well over one hour, so likelihood of a meaningful neurologic outcome is near zero.  As of 7/11 he has an abnormal neurologic  exam without evidence of purposeful neurologic movement.  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 P:   RASS goal: 0 Hold sedation Consider neurology consultation 7/12  PULMONARY A: Acute respiratory failure with hypoxemia Acute pulmonary edema P:   Full vent support VAP prevention bundle  CARDIOVASCULAR A:  Cardiac arrest> initial rhythm uncertain Vfib Ischemic cardiomyopathy Atrial fibrillation CAD Troponin elevated Cardiogenic shock > resolved P:  Tele monitoring Appreciate cardiology  RENAL A:   AKI due to cardiac arrest > worsening Not a candidate for HD given severe neurologic injury P:   Monitor BMET and UOP Replace electrolytes  as needed  GASTROINTESTINAL A:   No acute issues P:   Pepcid for stress ulcer prophylaxis Start tube feeding  HEMATOLOGIC A:   No issues P:  Monitor for bleeding  INFECTIOUS A:   Aspiration pneumonia? P:   F/u resp culture Continue Cefepime, hold vanc  ENDOCRINE A:   DM   P:   SSI  FAMILY  - Updates: prognosis dismal, family updated 7/11 extensively.  Will wait 72 hours after arrest then consider repeat imaging or neurology consultation.  - Inter-disciplinary family meet or Palliative Care meeting due by: day 7  My cc time 45 minutes  Heber Carrollwood, MD [facility] Pager: [phone] Cell: [phone] After 3pm or if no response, call [phone]   01/10/2016, 10:35 AM

## 2016-01-10 NOTE — Clinical Documentation Improvement (Signed)
Critical Care  Can the diagnosis of acute renal failure be further specified? Please document findings in next progress note. Thank you!   Acute renal failure  Acute tubular necrosis  Other  Clinically Undetermined  Supporting Information:  Sudden bump in Creatinine rising from 1.15 to 2.42 following cardiac arrest  Prolonged hypotension with cardiogenic shock  Granular casts noted in urine microscopic  Please exercise your independent, professional judgment when responding. A specific answer is not anticipated or expected.  Thank You,  Ruthine Dose RN, BSN, CCDS Clinical Documentation Specialist [facility] [phone]; cell [phone]

## 2016-01-10 NOTE — Progress Notes (Signed)
eLink Physician-Brief Progress Note Patient Name: William Pacheco DOB: [birth year] MRN: [id number]   Date of Service  01/10/2016  HPI/Events of Note  Notified of fever. Patient still full care at this time. Nurse also notes urine sediment with cloudy appearance. Previous UA without evidence of infection.   eICU Interventions  1. Checking UA with microscopy 2. Stat blood and tracheal aspirate cultures  3. Guarding empiric vancomycin & cefepime given persistent shock and fever 4. Checking Procalcitonin per algorithm with low threshold to de-escalate antibiotic therapy      Intervention Category Major Interventions: Sepsis - evaluation and management  Lawanda Cousins 01/10/2016, 12:39 AM

## 2016-01-10 NOTE — Progress Notes (Signed)
PATIENT ID: William Pacheco is a 81M with CAD s/p CABG, chronic systolic and diastolic heart failure LVEF 40%, PAD s/p L popliteal endarterectomy and patch angioplasty, non-flow limiting RAS, hyperlipidemia, and paroxysmal atrial fibrillation here with witnessed out of hospital arrest.  SUBJECTIVE:  Intubated.  Off sedation since 4:54 am.  Nonresponsive to verbal stimuli.    PHYSICAL EXAM Filed Vitals:   01/10/16 0400 01/10/16 0440 01/10/16 0500 01/10/16 0600  BP: 107/67  104/65 113/98  Pulse: 94  94 94  Temp: 99.7 F (37.6 C)  99.5 F (37.5 C) 99.1 F (37.3 C)  TempSrc: Core (Comment)     Resp: 26  26 26   Height:      Weight:  255 lb 8.2 oz (115.9 kg)    SpO2: 100%  100% 100%   General:  Intubated.  Critically ill Neck: Unable to assess JVD. Lungs:  The breath sounds anteriorly. No crackles, rhonchi, or wheezes. Heart:  Regular rate and rhythm. No murmurs, rubs, or gallops. Abdomen:  Soft. Active bowel sounds. Extremities:  Warm. No edema.   LABS: Lab Results  Component Value Date   TROPONINI 0.26* 01/23/2016   Results for orders placed or performed during the hospital encounter of 01/04/2016 (from the past 24 hour(s))  MRSA PCR Screening     Status: None   Collection Time: 01/12/2016  8:39 AM  Result Value Ref Range   MRSA by PCR NEGATIVE NEGATIVE  Glucose, capillary     Status: Abnormal   Collection Time: 01/06/2016 10:18 AM  Result Value Ref Range   Glucose-Capillary 296 (H) 65 - 99 mg/dL   Comment 1 Venous Specimen   Glucose, capillary     Status: Abnormal   Collection Time: 01/15/2016 11:45 AM  Result Value Ref Range   Glucose-Capillary 270 (H) 65 - 99 mg/dL  Triglycerides     Status: None   Collection Time: 01/02/2016 12:45 PM  Result Value Ref Range   Triglycerides 96 <150 mg/dL  Glucose, capillary     Status: Abnormal   Collection Time: 01/01/2016  3:44 PM  Result Value Ref Range   Glucose-Capillary 206 (H) 65 - 99 mg/dL   Comment 1 Venous Specimen   Glucose,  capillary     Status: Abnormal   Collection Time: 01/18/2016  9:15 PM  Result Value Ref Range   Glucose-Capillary 173 (H) 65 - 99 mg/dL   Comment 1 Capillary Specimen   Glucose, capillary     Status: Abnormal   Collection Time: 01/10/16  1:07 AM  Result Value Ref Range   Glucose-Capillary 146 (H) 65 - 99 mg/dL  Culture, respiratory (NON-Expectorated)     Status: None (Preliminary result)   Collection Time: 01/10/16  1:32 AM  Result Value Ref Range   Specimen Description TRACHEAL ASPIRATE    Special Requests Normal    Gram Stain      MODERATE WBC PRESENT,BOTH PMN AND MONONUCLEAR FEW GRAM POSITIVE COCCI IN PAIRS    Culture PENDING    Report Status PENDING   Urinalysis, Routine w reflex microscopic (not at [facility])     Status: Abnormal   Collection Time: 01/10/16  1:33 AM  Result Value Ref Range   Color, Urine YELLOW YELLOW   APPearance CLOUDY (A) CLEAR   Specific Gravity, Urine 1.023 1.005 - 1.030   pH 5.0 5.0 - 8.0   Glucose, UA >1000 (A) NEGATIVE mg/dL   Hgb urine dipstick LARGE (A) NEGATIVE   Bilirubin Urine NEGATIVE NEGATIVE   Ketones,  ur NEGATIVE NEGATIVE mg/dL   Protein, ur 162 (A) NEGATIVE mg/dL   Nitrite NEGATIVE NEGATIVE   Leukocytes, UA NEGATIVE NEGATIVE  Urine microscopic-add on     Status: Abnormal   Collection Time: 01/10/16  1:33 AM  Result Value Ref Range   Squamous Epithelial / LPF 0-5 (A) NONE SEEN   WBC, UA 0-5 0 - 5 WBC/hpf   RBC / HPF 6-30 0 - 5 RBC/hpf   Bacteria, UA FEW (A) NONE SEEN   Casts GRANULAR CAST (A) NEGATIVE  Procalcitonin - Baseline     Status: None   Collection Time: 01/10/16  2:09 AM  Result Value Ref Range   Procalcitonin 20.82 ng/mL  Glucose, capillary     Status: Abnormal   Collection Time: 01/10/16  4:51 AM  Result Value Ref Range   Glucose-Capillary 118 (H) 65 - 99 mg/dL   Comment 1 Venous Specimen   Triglycerides     Status: None   Collection Time: 01/10/16  4:53 AM  Result Value Ref Range   Triglycerides 88 <150 mg/dL  Basic  metabolic panel     Status: Abnormal   Collection Time: 01/10/16  5:00 AM  Result Value Ref Range   Sodium 142 135 - 145 mmol/L   Potassium 3.7 3.5 - 5.1 mmol/L   Chloride 113 (H) 101 - 111 mmol/L   CO2 19 (L) 22 - 32 mmol/L   Glucose, Bld 115 (H) 65 - 99 mg/dL   BUN 41 (H) 6 - 20 mg/dL   Creatinine, Ser 4.46 (H) 0.61 - 1.24 mg/dL   Calcium 7.0 (L) 8.9 - 10.3 mg/dL   GFR calc non Af Amer 27 (L) >60 mL/min   GFR calc Af Amer 31 (L) >60 mL/min   Anion gap 10 5 - 15  CBC     Status: None   Collection Time: 01/10/16  5:00 AM  Result Value Ref Range   WBC 8.9 4.0 - 10.5 K/uL   RBC 4.87 4.22 - 5.81 MIL/uL   Hemoglobin 14.0 13.0 - 17.0 g/dL   HCT 95.0 72.2 - 57.5 %   MCV 86.0 78.0 - 100.0 fL   MCH 28.7 26.0 - 34.0 pg   MCHC 33.4 30.0 - 36.0 g/dL   RDW 05.1 83.3 - 58.2 %   Platelets 153 150 - 400 K/uL    Intake/Output Summary (Last 24 hours) at 01/10/16 0830 Last data filed at 01/10/16 5189  Gross per 24 hour  Intake 2231.51 ml  Output   1450 ml  Net 781.51 ml    Telemetry:  PVCs, PSVT, 3-4 beats of NSVT  ASSESSMENT AND PLAN:  Principal Problem:   Cardiac arrest - witnessed, out of hospital arrest Active Problems:   Atherosclerosis of native arteries of extremity with intermittent claudication (HCC)   PAD - R Fem-Pop, L SFA stent; recent left popliteal endarterectomy with patch angioplasty   Essential hypertension   CAD- CABG x 6 9/08- Myoview 07/1024; Moderate Inf Infarct with very mild Peri-infarct ischemia   Hyperlipidemia with target LDL less than 70   DM type 2, uncontrolled, with neuropathy (HCC)   Anticoagulation adequate, on Xarelto-CHADs VASc=3   PAF (paroxysmal atrial fibrillation) (HCC)   Ischemic cardiomyopathy; EF roughly 40%   Chronic combined systolic and diastolic congestive heart failure, NYHA class 2 (HCC)   VF (ventricular fibrillation) -shocked 6 times   # Cardiac arrest: William Pacheco is s/p cardiac arrest and received 90 minutes of CPR in his home.  Thus far  there has been no meaningful neurologic recovery. There is no evidence that this was due to ischemia, as his postarrest EKG did not show any significant ischemic changes. We will obtain an echocardiogram today as the family wishes to pursue care. At this point the likelihood of meaningful recovery is slim. He remains hemodynamically stable and does not require pressor support unless he is on propofol. Propofol has been held since 5 AM and he has no response to painful stimuli or voice.   Cleotilde Spadaccini C. Duke Salvia, MD, [facility] 01/10/2016 8:30 AM

## 2016-01-10 NOTE — Progress Notes (Signed)
Pharmacy Antibiotic Note  William Pacheco is a 65 y.o. male admitted on 2016-02-02 s/p cardiac arrest, now with fevers, possible urosepsis.  Pharmacy has been consulted for Vancomycin and Cefepime  dosing.  Plan: Vancomycin 2 g IV now, then 1 g IV q24h Cefepime 1 g IV q12h  Height: 6' (182.9 cm) Weight: 266 lb 12.1 oz (121 kg) IBW/kg (Calculated) : 77.6  Temp (24hrs), Avg:97.6 F (36.4 C), Min:95 F (35 C), Max:101.3 F (38.5 C)   Recent Labs Lab 2016/02/02 0349 2016-02-02 0410 02/02/16 0413  WBC 15.7*  --   --   CREATININE 1.97* 1.90*  --   LATICACIDVEN  --   --  7.02*    Estimated Creatinine Clearance: 53.5 mL/min (by C-G formula based on Cr of 1.9).    No Known Allergies   Eddie Candle 01/10/2016 12:46 AM

## 2016-01-11 ENCOUNTER — Inpatient Hospital Stay (HOSPITAL_COMMUNITY): Payer: 59

## 2016-01-11 DIAGNOSIS — J9601 Acute respiratory failure with hypoxia: Secondary | ICD-10-CM

## 2016-01-11 DIAGNOSIS — I469 Cardiac arrest, cause unspecified: Secondary | ICD-10-CM

## 2016-01-11 LAB — BASIC METABOLIC PANEL
ANION GAP: 8 (ref 5–15)
Anion gap: 8 (ref 5–15)
BUN: 53 mg/dL — ABNORMAL HIGH (ref 6–20)
BUN: 57 mg/dL — AB (ref 6–20)
CHLORIDE: 110 mmol/L (ref 101–111)
CHLORIDE: 113 mmol/L — AB (ref 101–111)
CO2: 20 mmol/L — AB (ref 22–32)
CO2: 22 mmol/L (ref 22–32)
CREATININE: 2.28 mg/dL — AB (ref 0.61–1.24)
Calcium: 7.9 mg/dL — ABNORMAL LOW (ref 8.9–10.3)
Calcium: 8.1 mg/dL — ABNORMAL LOW (ref 8.9–10.3)
Creatinine, Ser: 2.43 mg/dL — ABNORMAL HIGH (ref 0.61–1.24)
GFR calc Af Amer: 33 mL/min — ABNORMAL LOW (ref >60)
GFR calc non Af Amer: 27 mL/min — ABNORMAL LOW (ref >60)
GFR calc non Af Amer: 29 mL/min — ABNORMAL LOW (ref >60)
GFR, EST AFRICAN AMERICAN: 31 mL/min — AB (ref >60)
GLUCOSE: 293 mg/dL — AB (ref 65–99)
Glucose, Bld: 241 mg/dL — ABNORMAL HIGH (ref 65–99)
POTASSIUM: 3.9 mmol/L (ref 3.5–5.1)
POTASSIUM: 4.2 mmol/L (ref 3.5–5.1)
SODIUM: 140 mmol/L (ref 135–145)
Sodium: 141 mmol/L (ref 135–145)

## 2016-01-11 LAB — ECHOCARDIOGRAM COMPLETE
Height: 72 in
Weight: 4010.61 oz

## 2016-01-11 LAB — CBC WITH DIFFERENTIAL/PLATELET
Basophils Absolute: 0 10*3/uL (ref 0.0–0.1)
Basophils Relative: 0 %
EOS ABS: 0 10*3/uL (ref 0.0–0.7)
EOS PCT: 0 %
HCT: 36.9 % — ABNORMAL LOW (ref 39.0–52.0)
Hemoglobin: 12.6 g/dL — ABNORMAL LOW (ref 13.0–17.0)
LYMPHS ABS: 0.9 10*3/uL (ref 0.7–4.0)
LYMPHS PCT: 10 %
MCH: 29.5 pg (ref 26.0–34.0)
MCHC: 34.1 g/dL (ref 30.0–36.0)
MCV: 86.4 fL (ref 78.0–100.0)
MONO ABS: 0.6 10*3/uL (ref 0.1–1.0)
Monocytes Relative: 7 %
Neutro Abs: 7.3 10*3/uL (ref 1.7–7.7)
Neutrophils Relative %: 83 %
PLATELETS: 131 10*3/uL — AB (ref 150–400)
RBC: 4.27 MIL/uL (ref 4.22–5.81)
RDW: 16.1 % — AB (ref 11.5–15.5)
WBC: 8.8 10*3/uL (ref 4.0–10.5)

## 2016-01-11 LAB — GLUCOSE, CAPILLARY
Glucose-Capillary: 281 mg/dL — ABNORMAL HIGH (ref 65–99)
Glucose-Capillary: 232 mg/dL — ABNORMAL HIGH (ref 65–99)
Glucose-Capillary: 228 mg/dL — ABNORMAL HIGH (ref 65–99)
Glucose-Capillary: 207 mg/dL — ABNORMAL HIGH (ref 65–99)
Glucose-Capillary: 228 mg/dL — ABNORMAL HIGH (ref 65–99)

## 2016-01-11 LAB — APTT
APTT: 171 s — AB (ref 24–37)
APTT: 110 s — AB (ref 24–37)

## 2016-01-11 LAB — PHOSPHORUS
PHOSPHORUS: 2.5 mg/dL (ref 2.5–4.6)
Phosphorus: 1.6 mg/dL — ABNORMAL LOW (ref 2.5–4.6)

## 2016-01-11 LAB — HEPARIN LEVEL (UNFRACTIONATED)
HEPARIN UNFRACTIONATED: 0.94 [IU]/mL — AB (ref 0.30–0.70)
HEPARIN UNFRACTIONATED: 0.59 [IU]/mL (ref 0.30–0.70)

## 2016-01-11 LAB — PROCALCITONIN: PROCALCITONIN: 10.37 ng/mL

## 2016-01-11 LAB — MAGNESIUM
Magnesium: 2.4 mg/dL (ref 1.7–2.4)
Magnesium: 2.7 mg/dL — ABNORMAL HIGH (ref 1.7–2.4)

## 2016-01-11 LAB — TRIGLYCERIDES: TRIGLYCERIDES: 150 mg/dL — AB (ref <150)

## 2016-01-11 MED ORDER — FAMOTIDINE 40 MG/5ML PO SUSR
20.0000 mg | Freq: Two times a day (BID) | ORAL | Status: DC
  Administered 2016-01-11 – 2016-01-21 (×21): 20 mg
  Filled 2016-01-11 (×21): qty 2.5

## 2016-01-11 MED ORDER — AMIODARONE LOAD VIA INFUSION
150.0000 mg | Freq: Once | INTRAVENOUS | Status: AC
  Administered 2016-01-11: 150 mg via INTRAVENOUS
  Filled 2016-01-11: qty 83.34

## 2016-01-11 MED ORDER — HEPARIN (PORCINE) IN NACL 100-0.45 UNIT/ML-% IJ SOLN
1500.0000 [IU]/h | INTRAMUSCULAR | Status: DC
  Administered 2016-01-11: 1500 [IU]/h via INTRAVENOUS
  Filled 2016-01-11: qty 250

## 2016-01-11 MED ORDER — HYDRALAZINE HCL 20 MG/ML IJ SOLN
10.0000 mg | INTRAMUSCULAR | Status: DC | PRN
  Administered 2016-01-11: 10 mg via INTRAVENOUS
  Filled 2016-01-11: qty 1

## 2016-01-11 MED ORDER — AMIODARONE HCL IN DEXTROSE 360-4.14 MG/200ML-% IV SOLN
60.0000 mg/h | INTRAVENOUS | Status: AC
  Administered 2016-01-11 (×2): 60 mg/h via INTRAVENOUS
  Filled 2016-01-11 (×2): qty 200

## 2016-01-11 MED ORDER — HEPARIN (PORCINE) IN NACL 100-0.45 UNIT/ML-% IJ SOLN
1200.0000 [IU]/h | INTRAMUSCULAR | Status: DC
  Administered 2016-01-11 – 2016-01-15 (×5): 1300 [IU]/h via INTRAVENOUS
  Filled 2016-01-11 (×5): qty 250

## 2016-01-11 MED ORDER — INSULIN GLARGINE 100 UNIT/ML ~~LOC~~ SOLN
15.0000 [IU] | Freq: Every day | SUBCUTANEOUS | Status: DC
Start: 2016-01-11 — End: 2016-01-12
  Administered 2016-01-11: 15 [IU] via SUBCUTANEOUS
  Filled 2016-01-11 (×2): qty 0.15

## 2016-01-11 MED ORDER — LACTATED RINGERS IV BOLUS (SEPSIS)
500.0000 mL | Freq: Once | INTRAVENOUS | Status: AC
  Administered 2016-01-11: 500 mL via INTRAVENOUS

## 2016-01-11 MED ORDER — AMIODARONE HCL IN DEXTROSE 360-4.14 MG/200ML-% IV SOLN
30.0000 mg/h | INTRAVENOUS | Status: DC
  Administered 2016-01-11 – 2016-01-21 (×20): 30 mg/h via INTRAVENOUS
  Filled 2016-01-11 (×21): qty 200

## 2016-01-11 MED ORDER — HEPARIN BOLUS VIA INFUSION
3000.0000 [IU] | Freq: Once | INTRAVENOUS | Status: AC
  Administered 2016-01-11: 3000 [IU] via INTRAVENOUS
  Filled 2016-01-11: qty 3000

## 2016-01-11 MED ORDER — INSULIN ASPART 100 UNIT/ML ~~LOC~~ SOLN
0.0000 [IU] | SUBCUTANEOUS | Status: DC
  Administered 2016-01-11 – 2016-01-13 (×10): 7 [IU] via SUBCUTANEOUS
  Administered 2016-01-13: 4 [IU] via SUBCUTANEOUS
  Administered 2016-01-13: 7 [IU] via SUBCUTANEOUS
  Administered 2016-01-13 (×2): 11 [IU] via SUBCUTANEOUS
  Administered 2016-01-13: 4 [IU] via SUBCUTANEOUS
  Administered 2016-01-14: 7 [IU] via SUBCUTANEOUS
  Administered 2016-01-14 (×2): 11 [IU] via SUBCUTANEOUS
  Administered 2016-01-14: 4 [IU] via SUBCUTANEOUS
  Administered 2016-01-14: 7 [IU] via SUBCUTANEOUS
  Administered 2016-01-14: 11 [IU] via SUBCUTANEOUS
  Administered 2016-01-15: 7 [IU] via SUBCUTANEOUS
  Administered 2016-01-15: 11 [IU] via SUBCUTANEOUS
  Administered 2016-01-15: 7 [IU] via SUBCUTANEOUS
  Administered 2016-01-15 – 2016-01-16 (×3): 11 [IU] via SUBCUTANEOUS
  Administered 2016-01-16: 7 [IU] via SUBCUTANEOUS
  Administered 2016-01-16 (×3): 11 [IU] via SUBCUTANEOUS
  Administered 2016-01-16: 4 [IU] via SUBCUTANEOUS
  Administered 2016-01-17 (×2): 11 [IU] via SUBCUTANEOUS
  Administered 2016-01-17: 15 [IU] via SUBCUTANEOUS
  Administered 2016-01-17: 3 [IU] via SUBCUTANEOUS
  Administered 2016-01-17: 20 [IU] via SUBCUTANEOUS
  Administered 2016-01-17 – 2016-01-18 (×2): 11 [IU] via SUBCUTANEOUS
  Administered 2016-01-18 (×4): 20 [IU] via SUBCUTANEOUS
  Administered 2016-01-18: 15 [IU] via SUBCUTANEOUS
  Administered 2016-01-19 (×2): 20 [IU] via SUBCUTANEOUS
  Administered 2016-01-19: 15 [IU] via SUBCUTANEOUS
  Administered 2016-01-19: 11 [IU] via SUBCUTANEOUS
  Administered 2016-01-19 (×2): 20 [IU] via SUBCUTANEOUS
  Administered 2016-01-20: 15 [IU] via SUBCUTANEOUS
  Administered 2016-01-20 (×3): 20 [IU] via SUBCUTANEOUS
  Administered 2016-01-20: 11 [IU] via SUBCUTANEOUS
  Administered 2016-01-20: 20 [IU] via SUBCUTANEOUS
  Administered 2016-01-21: 15 [IU] via SUBCUTANEOUS
  Administered 2016-01-21: 20 [IU] via SUBCUTANEOUS

## 2016-01-11 MED ORDER — METOPROLOL TARTRATE 12.5 MG HALF TABLET
12.5000 mg | ORAL_TABLET | Freq: Two times a day (BID) | ORAL | Status: DC
  Administered 2016-01-11 – 2016-01-12 (×3): 12.5 mg via ORAL
  Filled 2016-01-11 (×3): qty 1

## 2016-01-11 MED ORDER — SENNOSIDES 8.8 MG/5ML PO SYRP
10.0000 mL | ORAL_SOLUTION | Freq: Every day | ORAL | Status: DC
  Administered 2016-01-11 – 2016-01-19 (×9): 10 mL
  Filled 2016-01-11 (×10): qty 10

## 2016-01-11 NOTE — Progress Notes (Signed)
ANTICOAGULATION CONSULT NOTE - Follow Up Consult  Pharmacy Consult for Heparin Indication: atrial fibrillation  No Known Allergies  Patient Measurements: Height: 6' (182.9 cm) Weight: 250 lb 10.6 oz (113.7 kg) IBW/kg (Calculated) : 77.6 Heparin Dosing Weight: 102.7kg  Vital Signs: Temp: 98.1 F (36.7 C) (07/12 0730) Temp Source: Axillary (07/12 0730) BP: 145/85 mmHg (07/12 0900) Pulse Rate: 107 (07/12 0900)  Labs:  Recent Labs  January 14, 2016 0344  01-14-16 0349 01/14/16 0410 01/10/16 0500 01/11/16 0010 01/11/16 0437 01/11/16 0800  HGB  --   < > 15.3 16.7 14.0  --  12.6*  --   HCT  --   < > 47.1 49.0 41.9  --  36.9*  --   PLT  --   --  180  --  153  --  131*  --   APTT 33  --   --   --   --   --   --  171*  LABPROT 24.0*  --   --   --   --   --   --   --   INR 2.17*  --   --   --   --   --   --   --   HEPARINUNFRC  --   --   --   --   --   --   --  0.94*  CREATININE  --   < > 1.97* 1.90* 2.42* 2.43* 2.28*  --   TROPONINI  --   --  0.26*  --   --   --   --   --   < > = values in this interval not displayed.  Estimated Creatinine Clearance: 43.2 mL/min (by C-G formula based on Cr of 2.28).   Medications:  Heparin @ 1500 units/hr  Assessment: 65yom on xarelto pta for afib now s/p cardiac arrest and transitioned to IV heparin this morning. Initial aPTT and heparin level are elevated at 0.94 and 171 respectively. Unclear when last xarelto dose was taken. Baseline INR 2.17 indicating xarelto could still be on board, but he also has shock liver so elevated INR could be due to this. He is also in renal failure which would prolong xarelto clearance. Additionally, he had been receiving sq heparin (last dose 7/11 @ 2133). CBC is stable. No bleeding.  Goal of Therapy:  APTT 66-102 seconds Heparin level 0.3-0.7 units/ml Monitor platelets by anticoagulation protocol: Yes   Plan:  1) Hold heparin x 30 minutes then resume at 1300 units/hr 2) Check 6 hour aPTT and heparin  level  Fredrik Rigger 01/11/2016,9:35 AM

## 2016-01-11 NOTE — Progress Notes (Signed)
*  PRELIMINARY RESULTS* Echocardiogram 2D Echocardiogram has been performed.  William Pacheco 01/11/2016, 1:46 PM

## 2016-01-11 NOTE — Progress Notes (Signed)
PULMONARY / CRITICAL CARE MEDICINE   Name: William Pacheco MRN: [id number] DOB: 1951/01/03    ADMISSION DATE:  01/21/2016 CONSULTATION DATE:  01/05/2016  REFERRING MD:  Dr. Elesa Massed  CHIEF COMPLAINT:  Found down, cardiac arrest  BRIEF: 65 y/o male with CAD, CHF had a cardiac arrest (PEA/Vfib) on 7/10 and received 1 hour, 29 minutes of CPR.     SUBJECTIVE:  Started on antibiotics yesterday  Some spontaneous movements  VITAL SIGNS: BP 145/85 mmHg  Pulse 107  Temp(Src) 98.1 F (36.7 C) (Axillary)  Resp 25  Ht 6' (1.829 m)  Wt 250 lb 10.6 oz (113.7 kg)  BMI 33.99 kg/m2  SpO2 100%  HEMODYNAMICS:    VENTILATOR SETTINGS: Vent Mode:  [-] PCV FiO2 (%):  [30 %-40 %] 30 % Set Rate:  [26 bmp] 26 bmp PEEP:  [10 cmH20] 10 cmH20 Plateau Pressure:  [23 cmH20-25 cmH20] 25 cmH20  INTAKE / OUTPUT: I/O last 3 completed shifts: In: 3258.9 [I.V.:1185.5; NG/GT:773.3; IV Piggyback:1300] Out: 3705 [Urine:3605; Emesis/NG output:100]  PHYSICAL EXAMINATION: General:  On vent HENT: NCAT, ETT in place PULM: Few Rhonchi bilaterally, vent supported breaths CV: RRR, no murmur, gallop, rub GI: BS+, soft, nontender,tolerating TF MSK: normal bulk, no bony abnormality Neuro: GCS 4 (E2, V1, M1), bilateral corneals impaired, no gag, weak cough, + dolls, eyes deviated up and to the right with stimulation.  LABS:  BMET  Recent Labs Lab 01/10/16 0500 01/11/16 0010 01/11/16 0437  NA 142 141 140  K 3.7 4.2 3.9  CL 113* 113* 110  CO2 19* 20* 22  BUN 41* 53* 57*  CREATININE 2.42* 2.43* 2.28*  GLUCOSE 115* 241* 293*    Electrolytes  Recent Labs Lab 01/10/16 0500 01/10/16 1140 01/10/16 1807 01/11/16 0010 01/11/16 0437  CALCIUM 7.0*  --   --  8.1* 7.9*  MG  --  2.1 2.1  --  2.4  PHOS  --  3.0 3.1  --  2.5    CBC  Recent Labs Lab 01/11/2016 0349 01/24/2016 0410 01/10/16 0500 01/11/16 0437  WBC 15.7*  --  8.9 8.8  HGB 15.3 16.7 14.0 12.6*  HCT 47.1 49.0 41.9 36.9*  PLT 180  --   153 131*    Coag's  Recent Labs Lab 01/01/2016 0344 01/11/16 0800  APTT 33 171*  INR 2.17*  --     Sepsis Markers  Recent Labs Lab 01/26/2016 0413 01/10/16 0209 01/11/16 0437  LATICACIDVEN 7.02*  --   --   PROCALCITON  --  20.82 10.37    ABG  Recent Labs Lab 01/18/2016 0409 01/26/2016 0506 01/21/2016 0611  PHART 7.194* 7.236* 7.343*  PCO2ART 53.1* 53.0* 36.2  PO2ART 71.0* 37.0* 146.0*    Liver Enzymes  Recent Labs Lab 01/15/2016 0349  AST 472*  ALT 652*  ALKPHOS 62  BILITOT 1.1  ALBUMIN 3.7    Cardiac Enzymes  Recent Labs Lab 01/24/2016 0349  TROPONINI 0.26*    Glucose  Recent Labs Lab 01/10/16 0815 01/10/16 1202 01/10/16 1530 01/10/16 2001 01/10/16 2320 01/11/16 0347  GLUCAP 114* 126* 152* 213* 237* 281*    Imaging Dg Chest Port 1 View  01/11/2016  CLINICAL DATA:  Acute respiratory failure, hypoxia ; coronary artery disease with previous CABG. EXAM: PORTABLE CHEST 1 VIEW COMPARISON:  Portable chest x-ray of January 09, 2016 FINDINGS: The left lung is well expanded and clear. On the right confluent alveolar opacities have worsened in the perihilar region especially in the mid and lower lung.  There is no pleural effusion or pneumothorax. The heart is top-normal in size. The pulmonary vascularity is not engorged. The endotracheal tube tip lies 6.1 cm above the carina. Esophagogastric tube tip projects below the inferior margin of the image. The mediastinum is normal in width. Sternal wires appear intact. IMPRESSION: Interval worsening of alveolar opacities on the right most compatible with pneumonia though asymmetric pulmonary edema could produce similar findings. The support tubes are in stable position. Electronically Signed   By: David  [place] M.D.   On: 01/11/2016 07:06     STUDIES:  CT head 7/10 >> no intracranial acute abnormality; paranasal sinus disease EEG 7/10 > diffuse slowing  CULTURES: 7/11 blood > NGTD 7/11 resp > GPC in  pair>>>  ANTIBIOTICS: 7/11 vanc > 7/11 7/11 cefepime>   SIGNIFICANT EVENTS:   LINES/TUBES: 7/10 ETT >> 7/10 R Fem CVL >>   DISCUSSION: 65 y/o male with an extensive cardiology history including CAD, Cardiomyopathy and atrial fibrillation admitted after a cardiac arrest which was PEA and at times V-fib requiring 6 electric shocks.  His total time of CPR noted to be well over one hour, so likelihood of a meaningful neurologic outcome is near zero.  As of 7/12 he has an abnormal neurologic exam without evidence of purposeful neurologic movement.  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 PVS P:   RASS goal: 0 Fentanyl x 2 overnight for vent synchrony Consult Neuro for prognostication s/p cardiac arrest  PULMONARY A: Acute respiratory failure with hypoxemia Acute pulmonary edema Possible aspiration pneumonia P:   Full vent support; no weaning  VAP prevention bundle Worsening of right sided opacities on CXR Continue Vanc/Cefepime for possible pneumonia  CARDIOVASCULAR A:  Cardiac arrest> initial rhythm uncertain Vfib Ischemic cardiomyopathy Atrial fibrillation: Remains in A fib, rate controlled in 100-110's CAD Troponin elevated Cardiogenic shock > resolved Atrial Fibrillation QTc 504 on overnight EKG P:  Tele monitoring Continue to follow cardiology recommendations Amiodarone started overnight for A fib with RVR Cards adding Metoprolol for better rate control  RENAL A:   AKI due to cardiac arrest > worsening Not a candidate for HD given severe neurologic injury -265 for last 24 hours P:   Goal euvolemia Monitor BMET and UOP Continue Foley for accurate I/O Replace electrolytes as needed  GASTROINTESTINAL A:   No acute issues P:   Pepcid for stress ulcer prophylaxis - changed to VT Tolerating TF Last BM 7/10 - will add senna for bowel regimen  HEMATOLOGIC A:   Heparin  Gtt P:  Heparin gtt started overnight for A. Fib Pharmacy following and adjusting per protocol SCD for DVT prophylaxis  INFECTIOUS A:   Aspiration pneumonia vs HCAP P:   F/u resp culture Continue cefepime  ENDOCRINE A:   DM   P:   SSI Remains hyperglycemic- Lantus 15 units started  FAMILY  - Updates: prognosis dismal, family updated 7/11 extensively. Have placed consult to neurology   - Inter-disciplinary family meet or Palliative Care meeting due by: 01/16/16  Simonne Martinet ACNP-BC [facility] Pulmonary/Critical Care Pager # [phone] OR # [phone] if no answer

## 2016-01-11 NOTE — Progress Notes (Signed)
PATIENT ID: William Pacheco is a 50M with CAD s/p CABG, chronic systolic and diastolic heart failure LVEF 40%, PAD s/p L popliteal endarterectomy and patch angioplasty, non-flow limiting RAS, hyperlipidemia, and paroxysmal atrial fibrillation here with witnessed out of hospital arrest.  SUBJECTIVE:  Intubated.  Off sedation since 4:54 am.  Nonresponsive to verbal stimuli.    PHYSICAL EXAM Filed Vitals:   01/11/16 0400 01/11/16 0500 01/11/16 0600 01/11/16 0700  BP: 132/98 132/89 136/110 158/110  Pulse: 71 109 48 64  Temp: 98.8 F (37.1 C)     TempSrc: Axillary     Resp: 25 25 25 25   Height:      Weight:      SpO2: 100% 100% 100% 100%   General:  Intubated.  Critically ill Neck: JVP 4cm above clavicle at 45 degrees Lungs:  The breath sounds anteriorly. No crackles, rhonchi, or wheezes. Heart:  Regular rate and rhythm. No murmurs, rubs, or gallops. Abdomen:  Soft. Active bowel sounds. Extremities:  Warm. Bilateral upper extremity edema Neuro: GCS4 some eye opening to painful stimuli  LABS: Lab Results  Component Value Date   TROPONINI 0.26* January 25, 2016   Results for orders placed or performed during the hospital encounter of 25-Jan-2016 (from the past 24 hour(s))  Glucose, capillary     Status: Abnormal   Collection Time: 01/10/16  8:15 AM  Result Value Ref Range   Glucose-Capillary 114 (H) 65 - 99 mg/dL   Comment 1 Capillary Specimen   Magnesium     Status: None   Collection Time: 01/10/16 11:40 AM  Result Value Ref Range   Magnesium 2.1 1.7 - 2.4 mg/dL  Phosphorus     Status: None   Collection Time: 01/10/16 11:40 AM  Result Value Ref Range   Phosphorus 3.0 2.5 - 4.6 mg/dL  Glucose, capillary     Status: Abnormal   Collection Time: 01/10/16 12:02 PM  Result Value Ref Range   Glucose-Capillary 126 (H) 65 - 99 mg/dL   Comment 1 Capillary Specimen   Glucose, capillary     Status: Abnormal   Collection Time: 01/10/16  3:30 PM  Result Value Ref Range   Glucose-Capillary  152 (H) 65 - 99 mg/dL   Comment 1 Capillary Specimen   Magnesium     Status: None   Collection Time: 01/10/16  6:07 PM  Result Value Ref Range   Magnesium 2.1 1.7 - 2.4 mg/dL  Phosphorus     Status: None   Collection Time: 01/10/16  6:07 PM  Result Value Ref Range   Phosphorus 3.1 2.5 - 4.6 mg/dL  Glucose, capillary     Status: Abnormal   Collection Time: 01/10/16  8:01 PM  Result Value Ref Range   Glucose-Capillary 213 (H) 65 - 99 mg/dL   Comment 1 Capillary Specimen   Glucose, capillary     Status: Abnormal   Collection Time: 01/10/16 11:20 PM  Result Value Ref Range   Glucose-Capillary 237 (H) 65 - 99 mg/dL   Comment 1 Capillary Specimen   Basic metabolic panel     Status: Abnormal   Collection Time: 01/11/16 12:10 AM  Result Value Ref Range   Sodium 141 135 - 145 mmol/L   Potassium 4.2 3.5 - 5.1 mmol/L   Chloride 113 (H) 101 - 111 mmol/L   CO2 20 (L) 22 - 32 mmol/L   Glucose, Bld 241 (H) 65 - 99 mg/dL   BUN 53 (H) 6 - 20 mg/dL   Creatinine, Ser 7.79 (  H) 0.61 - 1.24 mg/dL   Calcium 8.1 (L) 8.9 - 10.3 mg/dL   GFR calc non Af Amer 27 (L) >60 mL/min   GFR calc Af Amer 31 (L) >60 mL/min   Anion gap 8 5 - 15  Glucose, capillary     Status: Abnormal   Collection Time: 01/11/16  3:47 AM  Result Value Ref Range   Glucose-Capillary 281 (H) 65 - 99 mg/dL   Comment 1 Capillary Specimen   Procalcitonin     Status: None   Collection Time: 01/11/16  4:37 AM  Result Value Ref Range   Procalcitonin 10.37 ng/mL  Magnesium     Status: None   Collection Time: 01/11/16  4:37 AM  Result Value Ref Range   Magnesium 2.4 1.7 - 2.4 mg/dL  Phosphorus     Status: None   Collection Time: 01/11/16  4:37 AM  Result Value Ref Range   Phosphorus 2.5 2.5 - 4.6 mg/dL  Basic metabolic panel     Status: Abnormal   Collection Time: 01/11/16  4:37 AM  Result Value Ref Range   Sodium 140 135 - 145 mmol/L   Potassium 3.9 3.5 - 5.1 mmol/L   Chloride 110 101 - 111 mmol/L   CO2 22 22 - 32 mmol/L    Glucose, Bld 293 (H) 65 - 99 mg/dL   BUN 57 (H) 6 - 20 mg/dL   Creatinine, Ser 1.61 (H) 0.61 - 1.24 mg/dL   Calcium 7.9 (L) 8.9 - 10.3 mg/dL   GFR calc non Af Amer 29 (L) >60 mL/min   GFR calc Af Amer 33 (L) >60 mL/min   Anion gap 8 5 - 15  CBC with Differential/Platelet     Status: Abnormal   Collection Time: 01/11/16  4:37 AM  Result Value Ref Range   WBC 8.8 4.0 - 10.5 K/uL   RBC 4.27 4.22 - 5.81 MIL/uL   Hemoglobin 12.6 (L) 13.0 - 17.0 g/dL   HCT 09.6 (L) 04.5 - 40.9 %   MCV 86.4 78.0 - 100.0 fL   MCH 29.5 26.0 - 34.0 pg   MCHC 34.1 30.0 - 36.0 g/dL   RDW 81.1 (H) 91.4 - 78.2 %   Platelets 131 (L) 150 - 400 K/uL   Neutrophils Relative % 83 %   Neutro Abs 7.3 1.7 - 7.7 K/uL   Lymphocytes Relative 10 %   Lymphs Abs 0.9 0.7 - 4.0 K/uL   Monocytes Relative 7 %   Monocytes Absolute 0.6 0.1 - 1.0 K/uL   Eosinophils Relative 0 %   Eosinophils Absolute 0.0 0.0 - 0.7 K/uL   Basophils Relative 0 %   Basophils Absolute 0.0 0.0 - 0.1 K/uL  Triglycerides     Status: Abnormal   Collection Time: 01/11/16  4:38 AM  Result Value Ref Range   Triglycerides 150 (H) <150 mg/dL    Intake/Output Summary (Last 24 hours) at 01/11/16 0804 Last data filed at 01/11/16 0700  Gross per 24 hour  Intake 2119.94 ml  Output   2030 ml  Net  89.94 ml    Telemetry:  PVCs, Atrial fibrillation with rapid ventricular response starting at 11:45 PM.  Rates 110-140s.  ASSESSMENT AND PLAN:  Principal Problem:   Cardiac arrest - witnessed, out of hospital arrest Active Problems:   Atherosclerosis of native arteries of extremity with intermittent claudication (HCC)   PAD - R Fem-Pop, L SFA stent; recent left popliteal endarterectomy with patch angioplasty   Essential hypertension  CAD- CABG x 6 9/08- Myoview 07/1024; Moderate Inf Infarct with very mild Peri-infarct ischemia   Hyperlipidemia with target LDL less than 70   DM type 2, uncontrolled, with neuropathy (HCC)   Anticoagulation adequate, on  Xarelto-CHADs VASc=3   PAF (paroxysmal atrial fibrillation) (HCC)   Ischemic cardiomyopathy; EF roughly 40%   Chronic combined systolic and diastolic congestive heart failure, NYHA class 2 (HCC)   VF (ventricular fibrillation) -shocked 6 times   Acute respiratory failure with hypoxemia (HCC)   Acute encephalopathy   # Cardiac arrest: William Pacheco is s/p cardiac arrest and received 90 minutes of CPR in his home. Thus far there has been no meaningful neurologic recovery.  GCS4.  Neurology will be consulted today. There is no evidence that this was due to ischemia, as his post-arrest EKG did not show any significant ischemic changes. Echocardiogram has been ordered and will be performed today. The likelihood of meaningful recovery remains slim. He remains hemodynamically stable and does not require pressor support.    # Acute on chronic systolic and diastolic heart failure: Last known LVEF 40%.  Echo this admission is pending.  He appears volume overloaded on exam.  If BP is stable this am after metoprolol, will give lasix  IV x1.  # Atrial fibrillation with RVR: William Pacheco had an episode of atrial fibrillation with RVR last night.  He was started on heparin for anticoagulation.  He was also started on amiodarone bolus and infusion.  Rates remain poorly-controlled.  We will start metoprolol 12.5 mg VT bid.  Sakina Briones C. Duke Salvia, MD, [facility] 01/11/2016 8:04 AM

## 2016-01-11 NOTE — Progress Notes (Addendum)
ANTICOAGULATION CONSULT NOTE - Initial Consult  Pharmacy Consult for Heparin Indication: atrial fibrillation  No Known Allergies  Patient Measurements: Height: 6' (182.9 cm) Weight: 255 lb 8.2 oz (115.9 kg) IBW/kg (Calculated) : 77.6   Vital Signs: Temp: 98.4 F (36.9 C) (07/12 0000) Temp Source: Axillary (07/12 0000) BP: 119/80 mmHg (07/12 0000) Pulse Rate: 58 (07/12 0000)  Labs:  Recent Labs  January 25, 2016 0344  01/25/16 0349 Jan 25, 2016 0410 01/10/16 0500  HGB  --   < > 15.3 16.7 14.0  HCT  --   --  47.1 49.0 41.9  PLT  --   --  180  --  153  APTT 33  --   --   --   --   LABPROT 24.0*  --   --   --   --   INR 2.17*  --   --   --   --   CREATININE  --   --  1.97* 1.90* 2.42*  TROPONINI  --   --  0.26*  --   --   < > = values in this interval not displayed.  Estimated Creatinine Clearance: 41.1 mL/min (by C-G formula based on Cr of 2.42).   Assessment: 65 y.o. male s/p cardiac arrest 7/10, in Afib, for heparin.  Heparin 5000 units SQ given at 2130 Patient was on Xarelto PTA (last dose likely 7/9)  Goal of Therapy:  APTT 66-102 while Xarelto interferring with anti-Xa level  Heparin level 0.3-0.7 units/ml Monitor platelets by anticoagulation protocol: Yes   Plan:  Heparin 3000 units IV bolus, then start heparin 1500 units/hr Check aPTT and heparin level in 8 hours.   Eddie Candle 01/11/2016,12:34 AM

## 2016-01-11 NOTE — Progress Notes (Signed)
ANTICOAGULATION CONSULT NOTE - Follow Up Consult  Pharmacy Consult for Heparin Indication: atrial fibrillation  No Known Allergies  Patient Measurements: Height: 6' (182.9 cm) Weight: 250 lb 10.6 oz (113.7 kg) IBW/kg (Calculated) : 77.6 Heparin Dosing Weight: 102.7kg  Vital Signs: Temp: 99.3 F (37.4 C) (07/12 1530) Temp Source: Axillary (07/12 1530) BP: 166/94 mmHg (07/12 1748) Pulse Rate: 133 (07/12 1748)  Labs:  Recent Labs  01-14-16 0344  14-Jan-2016 0349 2016-01-14 0410 01/10/16 0500 01/11/16 0010 01/11/16 0437 01/11/16 0800 01/11/16 1816  HGB  --   < > 15.3 16.7 14.0  --  12.6*  --   --   HCT  --   < > 47.1 49.0 41.9  --  36.9*  --   --   PLT  --   --  180  --  153  --  131*  --   --   APTT 33  --   --   --   --   --   --  171* 110*  LABPROT 24.0*  --   --   --   --   --   --   --   --   INR 2.17*  --   --   --   --   --   --   --   --   HEPARINUNFRC  --   --   --   --   --   --   --  0.94* 0.59  CREATININE  --   < > 1.97* 1.90* 2.42* 2.43* 2.28*  --   --   TROPONINI  --   --  0.26*  --   --   --   --   --   --   < > = values in this interval not displayed.  Estimated Creatinine Clearance: 43.2 mL/min (by C-G formula based on Cr of 2.28).   Assessment: 65yom on xarelto pta for afib now s/p cardiac arrest and transitioned to IV heparin this morning. Initial aPTT and heparin level are elevated at 0.94 and 171 respectively. Unclear when last xarelto dose was taken. Baseline INR 2.17 indicating xarelto could still be on board, but he also has shock liver so elevated INR could be due to this. He is also in renal failure which would prolong xarelto clearance. Additionally, he had been receiving sq heparin (last dose 7/11 @ 2133). CBC is stable. No bleeding.  Last aPTT remains elevated but down to 110, HL also down to 0.59 and therapetic. Seems that aPTT might be falsely elevated due to liver shock so will plan to follow HL going forward. May continue to trend down  depending on if Xarelto still in system. No issues documented.  Goal of Therapy:  Heparin level 0.3-0.7 units/ml Monitor platelets by anticoagulation protocol: Yes   Plan:  Continue heparin gtt at 1,300 units/hr Check 6 hr cHL Monitor daily HL, CBC, s/s of bleed   Armandina Stammer 01/11/2016,7:46 PM

## 2016-01-11 NOTE — Consult Note (Signed)
NEURO HOSPITALIST CONSULT NOTE   Requestig physician: Dr. Lake Bells   Reason for Consult: Prognosticion   History obtained from:  Chart   HPI:                                                                                                                                         65 y/o male with a past medical history of CAD, CHF and ischemic cardiomyopathy was admitted on 01/27/2016 with cardiac arrest. His wife provides history as he was encephalopathic and intubated on my exam. She notes that he was in his usual state of health until this morning at 2:00 AM when she witnessed him coming to bed and then suddenly collapsing on their bed. She said that she called 911 and was unable to perform CPR herself. The first responders brought him to their livingroom and performed CPR for 1 hours and 29 minutes. In the [facility] ER they noted him to be in shock on an epinephrine drip and he was intubated. HE was rewarmed 2 says ago at 0600. Currently he is intubated and on no sedation. Neurology was asked to evaluate for prognostication.   Past Medical History  Diagnosis Date  . CAD (coronary artery disease), native coronary artery September 2008    Left main 50-60%, LAD 80% with D2 involved. RCA mid occlusion --> CABG  . 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 Ischemic cardiomyopathy September 2008    EF = 35-40%; THE 45-50% IN 2013 --> ECHO 03/03/13 - EF 40-45% LV: Moderate HK of basal-midlateral & inferior myocardium; c/w prior infarction in the distribution of the RCA or LCx. Mild-Mod MR with Calcified annulus. Mod TR  . History of MI (myocardial infarction) 2012    Noted by echocardiography, and nuclear stress testing. Known RCA occlusion prior to CABG  . PAF (paroxysmal atrial fibrillation) - initial presentation with RVR, and heart failure 2013    On a beta blocker, calcium channel blocker & Xarelto. Lexiscan Myoview 11/08/11 LV was  enlarged with no evidence of ischemia. There was a moderate sized, moderate to severe in intensity fixed defect in the inferior wall suggestive of scar with no reversible ischemia. There was a basal septal akinesis w/severe global hypokinesis. EF = 31% (Which is different from the [facility] EF)  . Chronic diastolic CHF (congestive heart failure), NYHA class 2 ([place]) 2009, 2011; 2013    Exacerbated by A. fib RVR, currently on Lasix.  . Peripheral arterial disease ([place]) 2009, 2011; 2013    2009, 2011; 2013  . Renal artery stenosis, non-flow-limiting ([place])   . DM (diabetes mellitus), type 2 with neurological complications (HCC)     Peripheral neuropathy  . Diabetic peripheral neuropathy associated with type 2 diabetes  mellitus ([place])   . Hyperlipidemia LDL goal <70     On statin therapy  . Obesity (BMI 30.0-34.9)   . Hypertension associated with diabetes ([place])   . Erectile dysfunction associated with type 2 diabetes mellitus ([place])  2006  . Ischemic mitral regurgitation  1980s    Mild to moderate  . Thrombocytopenia (HCC)      chronic  . Osteoarthritis of both knees 1990    Status post left knee a arthroscopy, currently [place] right knee  . History of hepatitis B 1980s  . Adopted     Adopted [B76.28]    Past Surgical History  Procedure Laterality Date  . Femoral endarterectomy  03-15-2008    Left EIA & CFA endarterectomy by Dr. Amedeo Plenty  . Knee arthroscopy Left 1990    For osteoarthritis pain.  . Tonsillectomy    . Pleural effusion drainage Right   . Coronary artery bypass graft  03/2007    CABG x 6 LIMA-LAD, left radial to diagonal 1, SVG to OM1-OM2 sequential, SVG to PDA-PLA sequential.  . Transthoracic echocardiogram  November 2014    EF 40-45%. Moderate hypokinesis of the basal and mid lateral and inferior myocardium. Mild to moderate MR. Mild LA dilation.  . Transthoracic echocardiogram  07/26/2013    In setting of acute illness: EF 15-20%, atrial fibrillation; diffuse hypokinesis with  inferolateral abnormalities consistent with ischemic heart disease; grade 3 diastolic dysfunction/restrictive physiology - elevated LVEDP/LAP..  . Transthoracic echocardiogram  09/09/2013    40%. Septal hypokinesis. Moderate to severely dilated LV. Mild LVH and diffuse hypokinesis. Mild to moderate MR.  Marland Kitchen Nm myoview ltd  08/13/2013    Moderate LV dilation. Abnormal study with all high-risk features, unchanged from October 2014. Inferior defect likely representing nontransmural scar. Suggesting nonischemic cardiopathy superimposed on known ischemic cardiomyopathy.; Severe global hypokinesis with EF of roughly 31%.  . Vascular surgery Bilateral 2006    pt reports about 4 vascular surgeries starting in 2006 and last was in 2013.  Marland Kitchen Patch angioplasty Left 09/16/2013    Procedure: PATCH ANGIOPLASTY OF LEFT BELOW THE KNEE POPLITEAL ARTERY;  Surgeon: Conrad [place], MD;  Location: [place];  Service: Vascular;  Laterality: Left;  . Intraoperative arteriogram Left 09/16/2013    Procedure: INTRA OPERATIVE ARTERIOGRAM;  Surgeon: Conrad [place], MD;  Location: [place];  Service: Vascular;  Laterality: Left;  . Groin dissection Left 09/16/2013    Procedure: GROIN EXPLORATION;  Surgeon: Conrad [place], MD;  Location: [place];  Service: Vascular;  Laterality: Left;  . Endarterectomy popliteal Left 09/16/2013    Procedure: ENDARTERECTOMYOF LEFT POPLITEAL ARTERY;  Surgeon: Conrad [place], MD;  Location: [place];  Service: Vascular;  Laterality: Left;  . Abdominal aortagram N/A 08/30/2011    Procedure: ABDOMINAL Maxcine Ham;  Surgeon: Conrad [place], MD;  Location: [facility] CATH LAB;  Service: Cardiovascular;  Laterality: N/A;  . Percutaneous stent intervention Left 08/30/2011    Procedure: PERCUTANEOUS STENT INTERVENTION;  Surgeon: Conrad [place], MD;  Location: [facility] CATH LAB;  Service: Cardiovascular;  Laterality: Left;  . Lower extremity angiogram Left 09/07/2013    Procedure: LOWER EXTREMITY ANGIOGRAM;  Surgeon: Conrad , MD;  Location: [facility] CATH  LAB;  Service: Cardiovascular;  Laterality: Left;  . Nm myoview ltd  07/22/2014    EF ~35%. High-Risk study do to moderate size, medial intensity partially reversible defect consistent with prior infave High-Risk do to reduced LV function; inferior akinesia and global hypokinesis   . Abis  December  2016    Are ABI 0.78 = moderate arterial occlusive disease; L ABI 0.51 = moderate-severe occlusive arterial disease    Family History  Problem Relation Age of Onset  . Adopted: Yes  . Cancer Mother      Social History:  reports that he quit smoking about 10 years ago. His smoking use included Cigarettes. He started smoking about 42 years ago. He smoked 0.50 packs per day. He has never used smokeless tobacco. He reports that he does not drink alcohol or use illicit drugs.  No Known Allergies  MEDICATIONS:                                                                                                                     Prior to Admission:  Prescriptions prior to admission  Medication Sig Dispense Refill Last Dose  . aspirin 81 MG tablet Take 81 mg by mouth daily.   Taking  . atorvastatin (LIPITOR) 40 MG tablet TAKE 1 TABLET (40 MG TOTAL) BY MOUTH DAILY. 30 tablet 3 Taking  . Calcium Carb-Cholecalciferol (CALCIUM 1000 + D PO) Take 2 tablets by mouth daily.   Taking  . calcium carbonate (OS-CAL) 600 MG TABS tablet Take 600 mg by mouth 2 (two) times daily with a meal. Take two tablets daily   Taking  . carvedilol (COREG) 25 MG tablet Take 37.5 mg by mouth 2 (two) times daily with a meal. (1 and 1/2 tablets)   Taking  . cilostazol (PLETAL) 50 MG tablet TAKE 1 TABLET (50 MG TOTAL) BY MOUTH 2 (TWO) TIMES DAILY. 60 tablet 3 Taking  . Cyanocobalamin (VITAMIN B-12) 2000 MCG TBCR Take 3 tablets by mouth daily.   Taking  . furosemide (LASIX) 40 MG tablet TAKE 80 MG IN AM ,40 MG IN PM ON MONDAYS AND FRIDAYS,OTHER DAYS TAKE 40 MG TWICE A DAY 90 tablet 11 Taking  . Glucosamine-Chondroit-Vit C-Mn  (GLUCOSAMINE CHONDR 1500 COMPLX PO) Take 1,500 mg by mouth daily. Reported on 09/14/2015   Taking  . insulin regular human CONCENTRATED (HUMULIN R U-500 KWIKPEN) 500 UNIT/ML injection Inject 25 units at breakfast, 60 units at lunch and 40 units at breakfast (Patient taking differently: every morning. Inject 35 units at breakfast, 45 units at lunch and 35 units at breakfast) 4 pen 2 Taking  . INVOKANA 300 MG TABS tablet TAKE 1 TABLET BY MOUTH DAILY BEFORE BREAKFAST 30 tablet 0 Taking  . KLOR-CON M10 10 MEQ tablet    Taking  . losartan (COZAAR) 100 MG tablet Take 100 mg by mouth daily.   Taking  . Magnesium Oxide 500 MG CAPS Take 1,000 mg by mouth 2 (two) times daily.    Taking  . metFORMIN (GLUCOPHAGE-XR) 500 MG 24 hr tablet TAKE (3) TABLETS BY MOUTH DAILY 90 tablet 2 Taking  . mupirocin ointment (BACTROBAN) 2 %    Taking  . nitroGLYCERIN (NITROSTAT) 0.4 MG SL tablet Place 1 tablet (0.4 mg total) under the tongue every 5 (five) minutes as needed for chest  pain. MAX 3 doses. 25 tablet 3 Taking  . omega-3 acid ethyl esters (LOVAZA) 1 G capsule Take 1,200 g by mouth 3 (three) times daily.    Taking  . ONE TOUCH ULTRA TEST test strip    Taking  . PATADAY 0.2 % SOLN Reported on 10/31/2015  2 Taking  . pregabalin (LYRICA) 100 MG capsule Take 300 mg by mouth 3 (three) times daily.    Taking  . ramipril (ALTACE) 10 MG capsule    Taking  . Rivaroxaban (XARELTO) 20 MG TABS tablet Take 1 tablet (20 mg total) by mouth daily with supper. 30 tablet 0 Taking  . traMADol (ULTRAM) 50 MG tablet Take 1 tablet (50 mg total) by mouth every 6 (six) hours as needed. 50 tablet 0 Taking  . vitamin C (ASCORBIC ACID) 500 MG tablet Take 1,500 mg by mouth daily.   Taking  . Vitamin D, Cholecalciferol, 1000 UNITS TABS Take 3,000 Units by mouth daily.   Taking  . vitamin E 400 UNIT capsule Take 1,200 Units by mouth daily.   Taking   Scheduled: . antiseptic oral rinse  7 mL Mouth Rinse 10 times per day  . ceFEPime (MAXIPIME) IV   2 g Intravenous Q24H  . chlorhexidine gluconate (SAGE KIT)  15 mL Mouth Rinse BID  . famotidine  20 mg Per Tube BID  . feeding supplement (PRO-STAT SUGAR FREE 64)  60 mL Per Tube TID  . feeding supplement (VITAL HIGH PROTEIN)  1,000 mL Per Tube Q24H  . insulin aspart  0-15 Units Subcutaneous Q4H  . insulin glargine  15 Units Subcutaneous Daily  . metoprolol tartrate  12.5 mg Oral BID  . sennosides  10 mL Per Tube QHS  . sodium chloride flush  10-40 mL Intracatheter Q12H     ROS:                                                                                                                                       History obtained from unobtainable from patient due to mental status    Blood pressure 152/80, pulse 89, temperature 98 F (36.7 C), temperature source Axillary, resp. rate 25, height 6' (1.829 m), weight 113.7 kg (250 lb 10.6 oz), SpO2 100 %.   Neurologic Examination:                                                                                                        Neurological Examination Mental Status: Patient  does not respond to verbal stimuli.  Does not respond to deep sternal rub.  Does not follow commands.  No verbalizations noted.  Cranial Nerves: II: patient does not respond confrontation bilaterally, pupils right 2 mm, left 2 mm,and reactive bilaterally III,IV,VI: doll's response present bilaterally.  V,VII: corneal reflex present bilaterally  VIII: patient does not respond to verbal stimuli IX,X: gag reflex absent, XI: trapezius strength unable to test bilaterally XII: tongue strength unable to test Motor: Extremities flaccid throughout.  No spontaneous movement noted.  No purposeful movements noted. Sensory: Does not respond to noxious stimuli in any extremity. Deep Tendon Reflexes:  Absent throughout. Plantars: absent bilaterally Cerebellar: Unable to perform      Lab Results: Basic Metabolic Panel:  Recent Labs Lab 01/29/2016 0349  01/07/2016 0410 01/10/16 0500 01/10/16 1140 01/10/16 1807 01/11/16 0010 01/11/16 0437  NA 139 140 142  --   --  141 140  K 4.2 4.1 3.7  --   --  4.2 3.9  CL 103 102 113*  --   --  113* 110  CO2 22  --  19*  --   --  20* 22  GLUCOSE 266* 256* 115*  --   --  241* 293*  BUN 31* 40* 41*  --   --  53* 57*  CREATININE 1.97* 1.90* 2.42*  --   --  2.43* 2.28*  CALCIUM 8.9  --  7.0*  --   --  8.1* 7.9*  MG 2.6*  --   --  2.1 2.1  --  2.4  PHOS  --   --   --  3.0 3.1  --  2.5    Liver Function Tests:  Recent Labs Lab 01/15/2016 0349  AST 472*  ALT 652*  ALKPHOS 62  BILITOT 1.1  PROT 6.5  ALBUMIN 3.7   No results for input(s): LIPASE, AMYLASE in the last 168 hours. No results for input(s): AMMONIA in the last 168 hours.  CBC:  Recent Labs Lab 01/12/2016 0349 01/26/2016 0410 01/10/16 0500 01/11/16 0437  WBC 15.7*  --  8.9 8.8  NEUTROABS 10.5*  --   --  7.3  HGB 15.3 16.7 14.0 12.6*  HCT 47.1 49.0 41.9 36.9*  MCV 91.3  --  86.0 86.4  PLT 180  --  153 131*    Cardiac Enzymes:  Recent Labs Lab 01/28/2016 0349  TROPONINI 0.26*    Lipid Panel:  Recent Labs Lab 01/18/2016 1245 01/10/16 0453 01/11/16 0438  TRIG 96 88 150*    CBG:  Recent Labs Lab 01/10/16 1202 01/10/16 1530 01/10/16 2001 01/10/16 2320 01/11/16 0347  GLUCAP 126* 152* 213* 237* 36*    Microbiology: Results for orders placed or performed during the hospital encounter of 01/11/2016  MRSA PCR Screening     Status: None   Collection Time: 01/23/2016  8:39 AM  Result Value Ref Range Status   MRSA by PCR NEGATIVE NEGATIVE Final    Comment:        The GeneXpert MRSA Assay (FDA approved for NASAL specimens only), is one component of a comprehensive MRSA colonization surveillance program. It is not intended to diagnose MRSA infection nor to guide or monitor treatment for MRSA infections.   Culture, respiratory (NON-Expectorated)     Status: None (Preliminary result)   Collection Time: 01/10/16   1:32 AM  Result Value Ref Range Status   Specimen Description TRACHEAL ASPIRATE  Final   Special Requests Normal  Final   Gram Stain  Final    MODERATE WBC PRESENT,BOTH PMN AND MONONUCLEAR FEW GRAM POSITIVE COCCI IN PAIRS    Culture PENDING  Incomplete   Report Status PENDING  Incomplete  Culture, blood (routine x 2)     Status: None (Preliminary result)   Collection Time: 01/10/16  1:40 AM  Result Value Ref Range Status   Specimen Description BLOOD LEFT HAND  Final   Special Requests IN PEDIATRIC BOTTLE 2ML  Final   Culture NO GROWTH < 12 HOURS  Final   Report Status PENDING  Incomplete  Culture, blood (routine x 2)     Status: None (Preliminary result)   Collection Time: 01/10/16  2:00 AM  Result Value Ref Range Status   Specimen Description BLOOD RIGHT HAND  Final   Special Requests IN PEDIATRIC BOTTLE 1ML  Final   Culture NO GROWTH < 12 HOURS  Final   Report Status PENDING  Incomplete    Coagulation Studies:  Recent Labs  01/21/2016 0344  LABPROT 24.0*  INR 2.17*    Imaging: Dg Chest Port 1 View  01/11/2016  CLINICAL DATA:  Acute respiratory failure, hypoxia ; coronary artery disease with previous CABG. EXAM: PORTABLE CHEST 1 VIEW COMPARISON:  Portable chest x-ray of January 09, 2016 FINDINGS: The left lung is well expanded and clear. On the right confluent alveolar opacities have worsened in the perihilar region especially in the mid and lower lung. There is no pleural effusion or pneumothorax. The heart is top-normal in size. The pulmonary vascularity is not engorged. The endotracheal tube tip lies 6.1 cm above the carina. Esophagogastric tube tip projects below the inferior margin of the image. The mediastinum is normal in width. Sternal wires appear intact. IMPRESSION: Interval worsening of alveolar opacities on the right most compatible with pneumonia though asymmetric pulmonary edema could produce similar findings. The support tubes are in stable position. Electronically  Signed   By: David  Martinique M.D.   On: 01/11/2016 07:06       Assessment and plan per attending neurologist  Etta Quill PA-C [facility] [phone]  01/11/2016, 12:07 PM   Assessment/Plan: 65 y/o male with a past medical history of CAD, CHF and ischemic cardiomyopathy was admitted on 01/08/2016 with a prolonged cardiac arrest. Called to evaluate prognosis.  Patient currently rewarmed and remains off sedation. He maintains all brainstem reflexes EEG shows slowing - Will need MRI brain without contrast to better evaluate prognosis  Given the amount of time down, a prognosis for a meaningful recovery remains low. Family would like 5-7 days to see if he clinically improves.  Will continue to follow

## 2016-01-11 NOTE — Progress Notes (Signed)
eLink Physician-Brief Progress Note Patient Name: KINDLE CARVER DOB: 10/02/1950 MRN: [id number]   Date of Service  01/11/2016  HPI/Events of Note  Notified by bedside nurse of patient developing atrial fibrillation with rapid ventricular response. BP slightly decreased but stable. Currently not on vasopressor infusion. FiO2 0.3. EKG confirms A. fib with RVR. Serum magnesium 2.1. Potassium 3.7 this morning.   eICU Interventions  1. Requested nurse contact cardiology to make him aware to provide further recommendations 2. LR 0.5 L bolus IV now 3. Stat BMP 4. Continue telemetry monitoring      Intervention Category Major Interventions: Arrhythmia - evaluation and management  Lawanda Cousins 01/11/2016, 12:01 AM

## 2016-01-12 ENCOUNTER — Inpatient Hospital Stay (HOSPITAL_COMMUNITY): Payer: 59

## 2016-01-12 DIAGNOSIS — I5043 Acute on chronic combined systolic (congestive) and diastolic (congestive) heart failure: Secondary | ICD-10-CM | POA: Insufficient documentation

## 2016-01-12 DIAGNOSIS — I4901 Ventricular fibrillation: Principal | ICD-10-CM

## 2016-01-12 DIAGNOSIS — G931 Anoxic brain damage, not elsewhere classified: Secondary | ICD-10-CM | POA: Insufficient documentation

## 2016-01-12 LAB — GLUCOSE, CAPILLARY
GLUCOSE-CAPILLARY: 214 mg/dL — AB (ref 65–99)
Glucose-Capillary: 203 mg/dL — ABNORMAL HIGH (ref 65–99)
Glucose-Capillary: 225 mg/dL — ABNORMAL HIGH (ref 65–99)
Glucose-Capillary: 221 mg/dL — ABNORMAL HIGH (ref 65–99)
Glucose-Capillary: 225 mg/dL — ABNORMAL HIGH (ref 65–99)

## 2016-01-12 LAB — COMPREHENSIVE METABOLIC PANEL
ALBUMIN: 2.6 g/dL — AB (ref 3.5–5.0)
ALT: 146 U/L — ABNORMAL HIGH (ref 17–63)
ANION GAP: 8 (ref 5–15)
AST: 47 U/L — AB (ref 15–41)
Alkaline Phosphatase: 55 U/L (ref 38–126)
BILIRUBIN TOTAL: 1.3 mg/dL — AB (ref 0.3–1.2)
BUN: 55 mg/dL — AB (ref 6–20)
CHLORIDE: 115 mmol/L — AB (ref 101–111)
CO2: 25 mmol/L (ref 22–32)
Calcium: 8.3 mg/dL — ABNORMAL LOW (ref 8.9–10.3)
Creatinine, Ser: 1.63 mg/dL — ABNORMAL HIGH (ref 0.61–1.24)
GFR calc Af Amer: 50 mL/min — ABNORMAL LOW (ref >60)
GFR, EST NON AFRICAN AMERICAN: 43 mL/min — AB (ref >60)
GLUCOSE: 209 mg/dL — AB (ref 65–99)
POTASSIUM: 3.9 mmol/L (ref 3.5–5.1)
Sodium: 148 mmol/L — ABNORMAL HIGH (ref 135–145)
TOTAL PROTEIN: 6.3 g/dL — AB (ref 6.5–8.1)

## 2016-01-12 LAB — BLOOD GAS, ARTERIAL
Acid-base deficit: 0.7 mmol/L (ref 0.0–2.0)
BICARBONATE: 22.6 meq/L (ref 20.0–24.0)
Drawn by: 290171
FIO2: 0.3
LHR: 14 {breaths}/min
O2 Saturation: 90.9 %
PATIENT TEMPERATURE: 98.6
PEEP/CPAP: 10 cmH2O
Pressure control: 22 cmH2O
TCO2: 23.5 mmol/L (ref 0–100)
pCO2 arterial: 31.4 mmHg — ABNORMAL LOW (ref 35.0–45.0)
pH, Arterial: 7.471 — ABNORMAL HIGH (ref 7.350–7.450)
pO2, Arterial: 62.3 mmHg — ABNORMAL LOW (ref 80.0–100.0)

## 2016-01-12 LAB — CBC
HCT: 37.6 % — ABNORMAL LOW (ref 39.0–52.0)
HEMOGLOBIN: 12.6 g/dL — AB (ref 13.0–17.0)
MCH: 29.2 pg (ref 26.0–34.0)
MCHC: 33.5 g/dL (ref 30.0–36.0)
MCV: 87.2 fL (ref 78.0–100.0)
PLATELETS: 160 10*3/uL (ref 150–400)
RBC: 4.31 MIL/uL (ref 4.22–5.81)
RDW: 16.1 % — ABNORMAL HIGH (ref 11.5–15.5)
WBC: 12.9 10*3/uL — ABNORMAL HIGH (ref 4.0–10.5)

## 2016-01-12 LAB — PROCALCITONIN: PROCALCITONIN: 5.18 ng/mL

## 2016-01-12 LAB — CULTURE, RESPIRATORY W GRAM STAIN: Special Requests: NORMAL

## 2016-01-12 LAB — PHOSPHORUS: Phosphorus: 3.2 mg/dL (ref 2.5–4.6)

## 2016-01-12 LAB — HEPARIN LEVEL (UNFRACTIONATED)
HEPARIN UNFRACTIONATED: 0.39 [IU]/mL (ref 0.30–0.70)
Heparin Unfractionated: 0.44 IU/mL (ref 0.30–0.70)

## 2016-01-12 LAB — MAGNESIUM: MAGNESIUM: 2.7 mg/dL — AB (ref 1.7–2.4)

## 2016-01-12 LAB — CULTURE, RESPIRATORY: CULTURE: NORMAL

## 2016-01-12 LAB — PROTIME-INR
INR: 1.23 (ref 0.00–1.49)
PROTHROMBIN TIME: 15.6 s — AB (ref 11.6–15.2)

## 2016-01-12 MED ORDER — METOPROLOL TARTRATE 5 MG/5ML IV SOLN
5.0000 mg | Freq: Once | INTRAVENOUS | Status: AC
  Administered 2016-01-12: 5 mg via INTRAVENOUS
  Filled 2016-01-12: qty 5

## 2016-01-12 MED ORDER — METOPROLOL TARTRATE 12.5 MG HALF TABLET
12.5000 mg | ORAL_TABLET | Freq: Once | ORAL | Status: AC
  Administered 2016-01-12: 12.5 mg via ORAL
  Filled 2016-01-12: qty 1

## 2016-01-12 MED ORDER — METOPROLOL TARTRATE 5 MG/5ML IV SOLN
2.5000 mg | Freq: Four times a day (QID) | INTRAVENOUS | Status: DC | PRN
  Administered 2016-01-12 (×3): 2.5 mg via INTRAVENOUS
  Filled 2016-01-12 (×3): qty 5

## 2016-01-12 MED ORDER — INSULIN GLARGINE 100 UNIT/ML ~~LOC~~ SOLN
20.0000 [IU] | Freq: Every day | SUBCUTANEOUS | Status: DC
  Administered 2016-01-12 – 2016-01-13 (×2): 20 [IU] via SUBCUTANEOUS
  Filled 2016-01-12 (×3): qty 0.2

## 2016-01-12 MED ORDER — METOPROLOL TARTRATE 50 MG PO TABS
50.0000 mg | ORAL_TABLET | Freq: Three times a day (TID) | ORAL | Status: DC
  Administered 2016-01-12 – 2016-01-13 (×2): 50 mg via ORAL
  Filled 2016-01-12 (×2): qty 1

## 2016-01-12 MED ORDER — HYDRALAZINE HCL 20 MG/ML IJ SOLN
10.0000 mg | INTRAMUSCULAR | Status: DC | PRN
  Administered 2016-01-12 – 2016-01-13 (×3): 10 mg via INTRAVENOUS
  Filled 2016-01-12 (×3): qty 1

## 2016-01-12 MED ORDER — FUROSEMIDE 10 MG/ML IJ SOLN
40.0000 mg | Freq: Two times a day (BID) | INTRAMUSCULAR | Status: DC
  Administered 2016-01-12 – 2016-01-13 (×3): 40 mg via INTRAVENOUS
  Filled 2016-01-12 (×3): qty 4

## 2016-01-12 MED ORDER — FENTANYL CITRATE (PF) 100 MCG/2ML IJ SOLN
25.0000 ug | INTRAMUSCULAR | Status: DC | PRN
  Administered 2016-01-12 – 2016-01-16 (×5): 100 ug via INTRAVENOUS
  Filled 2016-01-12 (×6): qty 2

## 2016-01-12 MED ORDER — POTASSIUM PHOSPHATES 15 MMOLE/5ML IV SOLN
30.0000 mmol | Freq: Once | INTRAVENOUS | Status: AC
  Administered 2016-01-12: 30 mmol via INTRAVENOUS
  Filled 2016-01-12 (×2): qty 10

## 2016-01-12 MED ORDER — METOPROLOL TARTRATE 25 MG PO TABS
25.0000 mg | ORAL_TABLET | Freq: Three times a day (TID) | ORAL | Status: DC
  Administered 2016-01-12: 25 mg via ORAL
  Filled 2016-01-12: qty 1

## 2016-01-12 NOTE — Progress Notes (Signed)
eLink Physician-Brief Progress Note Patient Name: William Pacheco DOB: 10-Feb-1951 MRN: [id number]   Date of Service  01/12/2016  HPI/Events of Note  Notified by bedside nurse of ongoing intermittent hypertension as well as rapid ventricular response from atrial fibrillation/flutter. Patient without obvious pain or grimacing. Patient also with hypophosphatemia 1.6.   eICU Interventions  1. K-Phos 30 mmol IV 1 now 2. Lopressor 2.5 mg IV every 6 hours when necessary heart rate greater than 120 3. Continue IV hydralazine when necessary 4. Continue scheduled Lopressor 5. Continuing amiodarone infusion 6. Adjusting when necessary dose of IV fentanyl and instructed nurse to attempt this first in case pain is contributing to abnormalities 7. Continuing telemetry monitoring      Intervention Category Major Interventions: Arrhythmia - evaluation and management;Other:  Lawanda Cousins 01/12/2016, 12:28 AM

## 2016-01-12 NOTE — Progress Notes (Signed)
Paged ELink regarding phos 1.6 and patient heart rate 110-140s in afib. Will continue to watch heart rate closely. Dr. Molli Knock will look into phos replacement.

## 2016-01-12 NOTE — Progress Notes (Signed)
Sputum sample obtained and sent to lab by RT. 

## 2016-01-12 NOTE — Progress Notes (Signed)
PATIENT ID: William Pacheco is a 24M with CAD s/p CABG, chronic systolic and diastolic heart failure LVEF 40%, PAD s/p L popliteal endarterectomy and patch angioplasty, non-flow limiting RAS, hyperlipidemia, and paroxysmal atrial fibrillation here with witnessed out of hospital arrest.  SUBJECTIVE:  Intubated.  Off sedation since 01/10/16 at 5am.     PHYSICAL EXAM Filed Vitals:   01/12/16 0608 01/12/16 0700 01/12/16 0742 01/12/16 0800  BP:  180/105 163/79 170/98  Pulse: 132 110 88 109  Temp:   101.4 F (38.6 C)   TempSrc:   Oral   Resp: 19 17 20 20   Height:      Weight:      SpO2: 100% 100% 100% 100%   General:  Intubated.  Critically ill Neck: JVP 4cm above clavicle at 45 degrees Lungs:  The breath sounds anteriorly. No crackles, rhonchi, or wheezes. Heart:  Regular rate and rhythm. No murmurs, rubs, or gallops. Abdomen:  Soft. Active bowel sounds. Extremities:  Warm. Bilateral upper extremity edema  LABS: Lab Results  Component Value Date   TROPONINI 0.26* 2016-02-03   Results for orders placed or performed during the hospital encounter of 02/03/16 (from the past 24 hour(s))  Glucose, capillary     Status: Abnormal   Collection Time: 01/11/16 11:39 AM  Result Value Ref Range   Glucose-Capillary 232 (H) 65 - 99 mg/dL   Comment 1 Notify RN   Glucose, capillary     Status: Abnormal   Collection Time: 01/11/16  3:57 PM  Result Value Ref Range   Glucose-Capillary 228 (H) 65 - 99 mg/dL   Comment 1 Notify RN   APTT     Status: Abnormal   Collection Time: 01/11/16  6:16 PM  Result Value Ref Range   aPTT 110 (H) 24 - 37 seconds  Heparin level (unfractionated)     Status: None   Collection Time: 01/11/16  6:16 PM  Result Value Ref Range   Heparin Unfractionated 0.59 0.30 - 0.70 IU/mL  Magnesium     Status: Abnormal   Collection Time: 01/11/16  6:26 PM  Result Value Ref Range   Magnesium 2.7 (H) 1.7 - 2.4 mg/dL  Phosphorus     Status: Abnormal   Collection Time: 01/11/16   6:26 PM  Result Value Ref Range   Phosphorus 1.6 (L) 2.5 - 4.6 mg/dL  Glucose, capillary     Status: Abnormal   Collection Time: 01/11/16  8:04 PM  Result Value Ref Range   Glucose-Capillary 207 (H) 65 - 99 mg/dL  Glucose, capillary     Status: Abnormal   Collection Time: 01/11/16 11:26 PM  Result Value Ref Range   Glucose-Capillary 228 (H) 65 - 99 mg/dL   Comment 1 Venous Specimen   Heparin level (unfractionated)     Status: None   Collection Time: 01/11/16 11:36 PM  Result Value Ref Range   Heparin Unfractionated 0.44 0.30 - 0.70 IU/mL  Blood gas, arterial     Status: Abnormal   Collection Time: 01/12/16  4:00 AM  Result Value Ref Range   FIO2 0.30    Delivery systems VENTILATOR    Mode PRESSURE CONTROL    LHR 14 resp/min   Peep/cpap 10.0 cm H20   Pressure control 22 cm H20   pH, Arterial 7.471 (H) 7.350 - 7.450   pCO2 arterial 31.4 (L) 35.0 - 45.0 mmHg   pO2, Arterial 62.3 (L) 80.0 - 100.0 mmHg   Bicarbonate 22.6 20.0 - 24.0 mEq/L  TCO2 23.5 0 - 100 mmol/L   Acid-base deficit 0.7 0.0 - 2.0 mmol/L   O2 Saturation 90.9 %   Patient temperature 98.6    Collection site RIGHT RADIAL    Drawn by [phone]    Sample type ARTERIAL DRAW    Allens test (pass/fail) PASS PASS  Glucose, capillary     Status: Abnormal   Collection Time: 01/12/16  4:34 AM  Result Value Ref Range   Glucose-Capillary 214 (H) 65 - 99 mg/dL   Comment 1 Venous Specimen   Procalcitonin     Status: None   Collection Time: 01/12/16  4:35 AM  Result Value Ref Range   Procalcitonin 5.18 ng/mL  Heparin level (unfractionated)     Status: None   Collection Time: 01/12/16  4:35 AM  Result Value Ref Range   Heparin Unfractionated 0.39 0.30 - 0.70 IU/mL  CBC     Status: Abnormal   Collection Time: 01/12/16  4:35 AM  Result Value Ref Range   WBC 12.9 (H) 4.0 - 10.5 K/uL   RBC 4.31 4.22 - 5.81 MIL/uL   Hemoglobin 12.6 (L) 13.0 - 17.0 g/dL   HCT 69.4 (L) 85.4 - 62.7 %   MCV 87.2 78.0 - 100.0 fL   MCH 29.2  26.0 - 34.0 pg   MCHC 33.5 30.0 - 36.0 g/dL   RDW 03.5 (H) 00.9 - 38.1 %   Platelets 160 150 - 400 K/uL  Comprehensive metabolic panel     Status: Abnormal   Collection Time: 01/12/16  4:35 AM  Result Value Ref Range   Sodium 148 (H) 135 - 145 mmol/L   Potassium 3.9 3.5 - 5.1 mmol/L   Chloride 115 (H) 101 - 111 mmol/L   CO2 25 22 - 32 mmol/L   Glucose, Bld 209 (H) 65 - 99 mg/dL   BUN 55 (H) 6 - 20 mg/dL   Creatinine, Ser 8.29 (H) 0.61 - 1.24 mg/dL   Calcium 8.3 (L) 8.9 - 10.3 mg/dL   Total Protein 6.3 (L) 6.5 - 8.1 g/dL   Albumin 2.6 (L) 3.5 - 5.0 g/dL   AST 47 (H) 15 - 41 U/L   ALT 146 (H) 17 - 63 U/L   Alkaline Phosphatase 55 38 - 126 U/L   Total Bilirubin 1.3 (H) 0.3 - 1.2 mg/dL   GFR calc non Af Amer 43 (L) >60 mL/min   GFR calc Af Amer 50 (L) >60 mL/min   Anion gap 8 5 - 15  Magnesium     Status: Abnormal   Collection Time: 01/12/16  4:35 AM  Result Value Ref Range   Magnesium 2.7 (H) 1.7 - 2.4 mg/dL  Phosphorus     Status: None   Collection Time: 01/12/16  4:35 AM  Result Value Ref Range   Phosphorus 3.2 2.5 - 4.6 mg/dL    Intake/Output Summary (Last 24 hours) at 01/12/16 0820 Last data filed at 01/12/16 0600  Gross per 24 hour  Intake 2174.15 ml  Output   3350 ml  Net -1175.85 ml    Telemetry:  PVCs, Atrial fibrillation with rapid ventricular response starting at 11:45 PM.  Rates 110-140s.  Echo 01/11/16: Study Conclusions  - Left ventricle: The cavity size was normal. There was moderate  concentric hypertrophy. Systolic function was severely reduced.  The estimated ejection fraction was in the range of 20% to 25%.  Diffuse hypokinesis. - Aortic valve: Trileaflet; normal thickness leaflets.  Transvalvular velocity was within the normal range. There was  no  stenosis. - Aortic root: The aortic root was normal in size. - Mitral valve: There was mild regurgitation. - Right ventricle: Systolic function was mildly reduced. - Right atrium: The atrium was  normal in size. - Pulmonary arteries: Systolic pressure was within the normal  range. - Inferior vena cava: The vessel was dilated. The respirophasic  diameter changes were blunted (< 50%), consistent with elevated  central venous pressure. - Pericardium, extracardiac: There was no pericardial effusion.  Impressions:  - Poor study quality, however when compared to the prior study from  09/09/2013 LVEF is now severely impaired estimated at 20-25% with  diffuse hypokinesis.  RV systolic function is at least mildly impaired.  ASSESSMENT AND PLAN:  Principal Problem:   Cardiac arrest - witnessed, out of hospital arrest Active Problems:   Atherosclerosis of native arteries of extremity with intermittent claudication (HCC)   PAD - R Fem-Pop, L SFA stent; recent left popliteal endarterectomy with patch angioplasty   Essential hypertension   CAD- CABG x 6 9/08- Myoview 07/1024; Moderate Inf Infarct with very mild Peri-infarct ischemia   Hyperlipidemia with target LDL less than 70   DM type 2, uncontrolled, with neuropathy (HCC)   Anticoagulation adequate, on Xarelto-CHADs VASc=3   PAF (paroxysmal atrial fibrillation) (HCC)   Ischemic cardiomyopathy; EF roughly 40%   Chronic combined systolic and diastolic congestive heart failure, NYHA class 2 (HCC)   VF (ventricular fibrillation) -shocked 6 times   Acute respiratory failure with hypoxemia (HCC)   Acute encephalopathy   # Cardiac arrest: William Pacheco is s/p cardiac arrest and received 90 minutes of CPR in his home. There is no evidence that this was due to ischemia, as his post-arrest EKG did not show any significant ischemic changes. Thus far there has been no meaningful neurologic recovery.  Neurology was consulted and he was found to have hypoxic ischemic of the thalami, caudate nucleus, medial occipital and parietal lobes.  Prognosis seems grim.  Will await Neurology input.  He remains hemodynamically stable and does not require  pressor support.    # Acute on chronic systolic and diastolic heart failure: Last known LVEF 40%.  Echo this admission revealed LVEF 20-25% with diffuse hypokinesis. There is also RV impairment. We will increase metoprolol to  q8h VT and give lasix 40 mg IV bid.  No additional intervention such as cath or ICD without meaningful neurologic recovery.  # Atrial fibrillation with RVR: William Pacheco had an episode of atrial fibrillation with RVR last night.  He was started on heparin for anticoagulation.  He was also started on amiodarone bolus and infusion.  Rates remain poorly-controlled.   Increase metoprolol as above.  # Hypertensive heart disease: BP is poorly-controlled. Increase metoprolol and diurese as above.    Clare Casto C. Duke Salvia, MD, [facility] 01/12/2016 8:20 AM

## 2016-01-12 NOTE — Consult Note (Signed)
NEURO HOSPITALIST CONSULT NOTE   Requestig physician: Dr. Lake Bells   Reason for Consult: Prognosticion   History obtained from:  Chart   HPI:                                                                                                                                         65 y/o male with a past medical history of CAD, CHF and ischemic cardiomyopathy was admitted on 01/23/2016 with cardiac arrest. His wife provides history as he was encephalopathic and intubated on my exam. She notes that he was in his usual state of health until this morning at 2:00 AM when she witnessed him coming to bed and then suddenly collapsing on their bed. She said that she called 911 and was unable to perform CPR herself. The first responders brought him to their livingroom and performed CPR for 1 hours and 29 minutes. In the [facility] ER they noted him to be in shock on an epinephrine drip and he was intubated. HE was rewarmed 2 says ago at 0600. Currently he is intubated and on no sedation. Neurology was asked to evaluate for prognostication.   Past Medical History  Diagnosis Date  . CAD (coronary artery disease), native coronary artery September 2008    Left main 50-60%, LAD 80% with D2 involved. RCA mid occlusion --> CABG  . 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 Ischemic cardiomyopathy September 2008    EF = 35-40%; THE 45-50% IN 2013 --> ECHO 03/03/13 - EF 40-45% LV: Moderate HK of basal-midlateral & inferior myocardium; c/w prior infarction in the distribution of the RCA or LCx. Mild-Mod MR with Calcified annulus. Mod TR  . History of MI (myocardial infarction) 2012    Noted by echocardiography, and nuclear stress testing. Known RCA occlusion prior to CABG  . PAF (paroxysmal atrial fibrillation) - initial presentation with RVR, and heart failure 2013    On a beta blocker, calcium channel blocker & Xarelto. Lexiscan Myoview 11/08/11 LV was  enlarged with no evidence of ischemia. There was a moderate sized, moderate to severe in intensity fixed defect in the inferior wall suggestive of scar with no reversible ischemia. There was a basal septal akinesis w/severe global hypokinesis. EF = 31% (Which is different from the [facility] EF)  . Chronic diastolic CHF (congestive heart failure), NYHA class 2 ([place]) 2009, 2011; 2013    Exacerbated by A. fib RVR, currently on Lasix.  . Peripheral arterial disease ([place]) 2009, 2011; 2013    2009, 2011; 2013  . Renal artery stenosis, non-flow-limiting ([place])   . DM (diabetes mellitus), type 2 with neurological complications (HCC)     Peripheral neuropathy  . Diabetic peripheral neuropathy associated with type 2 diabetes  mellitus ([place])   . Hyperlipidemia LDL goal <70     On statin therapy  . Obesity (BMI 30.0-34.9)   . Hypertension associated with diabetes ([place])   . Erectile dysfunction associated with type 2 diabetes mellitus ([place])  2006  . Ischemic mitral regurgitation  1980s    Mild to moderate  . Thrombocytopenia (HCC)      chronic  . Osteoarthritis of both knees 1990    Status post left knee a arthroscopy, currently [place] right knee  . History of hepatitis B 1980s  . Adopted     Adopted [B76.28]    Past Surgical History  Procedure Laterality Date  . Femoral endarterectomy  03-15-2008    Left EIA & CFA endarterectomy by Dr. Amedeo Plenty  . Knee arthroscopy Left 1990    For osteoarthritis pain.  . Tonsillectomy    . Pleural effusion drainage Right   . Coronary artery bypass graft  03/2007    CABG x 6 LIMA-LAD, left radial to diagonal 1, SVG to OM1-OM2 sequential, SVG to PDA-PLA sequential.  . Transthoracic echocardiogram  November 2014    EF 40-45%. Moderate hypokinesis of the basal and mid lateral and inferior myocardium. Mild to moderate MR. Mild LA dilation.  . Transthoracic echocardiogram  07/26/2013    In setting of acute illness: EF 15-20%, atrial fibrillation; diffuse hypokinesis with  inferolateral abnormalities consistent with ischemic heart disease; grade 3 diastolic dysfunction/restrictive physiology - elevated LVEDP/LAP..  . Transthoracic echocardiogram  09/09/2013    40%. Septal hypokinesis. Moderate to severely dilated LV. Mild LVH and diffuse hypokinesis. Mild to moderate MR.  Marland Kitchen Nm myoview ltd  08/13/2013    Moderate LV dilation. Abnormal study with all high-risk features, unchanged from October 2014. Inferior defect likely representing nontransmural scar. Suggesting nonischemic cardiopathy superimposed on known ischemic cardiomyopathy.; Severe global hypokinesis with EF of roughly 31%.  . Vascular surgery Bilateral 2006    pt reports about 4 vascular surgeries starting in 2006 and last was in 2013.  Marland Kitchen Patch angioplasty Left 09/16/2013    Procedure: PATCH ANGIOPLASTY OF LEFT BELOW THE KNEE POPLITEAL ARTERY;  Surgeon: Conrad Jakin, MD;  Location: [place];  Service: Vascular;  Laterality: Left;  . Intraoperative arteriogram Left 09/16/2013    Procedure: INTRA OPERATIVE ARTERIOGRAM;  Surgeon: Conrad Dresser, MD;  Location: [place];  Service: Vascular;  Laterality: Left;  . Groin dissection Left 09/16/2013    Procedure: GROIN EXPLORATION;  Surgeon: Conrad [place], MD;  Location: [place];  Service: Vascular;  Laterality: Left;  . Endarterectomy popliteal Left 09/16/2013    Procedure: ENDARTERECTOMYOF LEFT POPLITEAL ARTERY;  Surgeon: Conrad Wann, MD;  Location: [place];  Service: Vascular;  Laterality: Left;  . Abdominal aortagram N/A 08/30/2011    Procedure: ABDOMINAL Maxcine Ham;  Surgeon: Conrad [place], MD;  Location: [facility] CATH LAB;  Service: Cardiovascular;  Laterality: N/A;  . Percutaneous stent intervention Left 08/30/2011    Procedure: PERCUTANEOUS STENT INTERVENTION;  Surgeon: Conrad [place], MD;  Location: [facility] CATH LAB;  Service: Cardiovascular;  Laterality: Left;  . Lower extremity angiogram Left 09/07/2013    Procedure: LOWER EXTREMITY ANGIOGRAM;  Surgeon: Conrad [place], MD;  Location: [facility] CATH  LAB;  Service: Cardiovascular;  Laterality: Left;  . Nm myoview ltd  07/22/2014    EF ~35%. High-Risk study do to moderate size, medial intensity partially reversible defect consistent with prior infave High-Risk do to reduced LV function; inferior akinesia and global hypokinesis   . Abis  December  2016    Are ABI 0.78 = moderate arterial occlusive disease; L ABI 0.51 = moderate-severe occlusive arterial disease    Family History  Problem Relation Age of Onset  . Adopted: Yes  . Cancer Mother      Social History:  reports that he quit smoking about 10 years ago. His smoking use included Cigarettes. He started smoking about 42 years ago. He smoked 0.50 packs per day. He has never used smokeless tobacco. He reports that he does not drink alcohol or use illicit drugs.  No Known Allergies  MEDICATIONS:                                                                                                                     Prior to Admission:  Prescriptions prior to admission  Medication Sig Dispense Refill Last Dose  . aspirin 81 MG tablet Take 81 mg by mouth daily.   Taking  . atorvastatin (LIPITOR) 40 MG tablet TAKE 1 TABLET (40 MG TOTAL) BY MOUTH DAILY. 30 tablet 3 Taking  . Calcium Carb-Cholecalciferol (CALCIUM 1000 + D PO) Take 2 tablets by mouth daily.   Taking  . calcium carbonate (OS-CAL) 600 MG TABS tablet Take 600 mg by mouth 2 (two) times daily with a meal. Take two tablets daily   Taking  . carvedilol (COREG) 25 MG tablet Take 37.5 mg by mouth 2 (two) times daily with a meal. (1 and 1/2 tablets)   Taking  . cilostazol (PLETAL) 50 MG tablet TAKE 1 TABLET (50 MG TOTAL) BY MOUTH 2 (TWO) TIMES DAILY. 60 tablet 3 Taking  . Cyanocobalamin (VITAMIN B-12) 2000 MCG TBCR Take 3 tablets by mouth daily.   Taking  . furosemide (LASIX) 40 MG tablet TAKE 80 MG IN AM ,40 MG IN PM ON MONDAYS AND FRIDAYS,OTHER DAYS TAKE 40 MG TWICE A DAY 90 tablet 11 Taking  . Glucosamine-Chondroit-Vit C-Mn  (GLUCOSAMINE CHONDR 1500 COMPLX PO) Take 1,500 mg by mouth daily. Reported on 09/14/2015   Taking  . insulin regular human CONCENTRATED (HUMULIN R U-500 KWIKPEN) 500 UNIT/ML injection Inject 25 units at breakfast, 60 units at lunch and 40 units at breakfast (Patient taking differently: every morning. Inject 35 units at breakfast, 45 units at lunch and 35 units at breakfast) 4 pen 2 Taking  . INVOKANA 300 MG TABS tablet TAKE 1 TABLET BY MOUTH DAILY BEFORE BREAKFAST 30 tablet 0 Taking  . KLOR-CON M10 10 MEQ tablet    Taking  . losartan (COZAAR) 100 MG tablet Take 100 mg by mouth daily.   Taking  . Magnesium Oxide 500 MG CAPS Take 1,000 mg by mouth 2 (two) times daily.    Taking  . metFORMIN (GLUCOPHAGE-XR) 500 MG 24 hr tablet TAKE (3) TABLETS BY MOUTH DAILY 90 tablet 2 Taking  . mupirocin ointment (BACTROBAN) 2 %    Taking  . nitroGLYCERIN (NITROSTAT) 0.4 MG SL tablet Place 1 tablet (0.4 mg total) under the tongue every 5 (five) minutes as needed for chest  pain. MAX 3 doses. 25 tablet 3 Taking  . omega-3 acid ethyl esters (LOVAZA) 1 G capsule Take 1,200 g by mouth 3 (three) times daily.    Taking  . ONE TOUCH ULTRA TEST test strip    Taking  . PATADAY 0.2 % SOLN Reported on 10/31/2015  2 Taking  . pregabalin (LYRICA) 100 MG capsule Take 300 mg by mouth 3 (three) times daily.    Taking  . ramipril (ALTACE) 10 MG capsule    Taking  . Rivaroxaban (XARELTO) 20 MG TABS tablet Take 1 tablet (20 mg total) by mouth daily with supper. 30 tablet 0 Taking  . traMADol (ULTRAM) 50 MG tablet Take 1 tablet (50 mg total) by mouth every 6 (six) hours as needed. 50 tablet 0 Taking  . vitamin C (ASCORBIC ACID) 500 MG tablet Take 1,500 mg by mouth daily.   Taking  . Vitamin D, Cholecalciferol, 1000 UNITS TABS Take 3,000 Units by mouth daily.   Taking  . vitamin E 400 UNIT capsule Take 1,200 Units by mouth daily.   Taking   Scheduled: . antiseptic oral rinse  7 mL Mouth Rinse 10 times per day  . ceFEPime (MAXIPIME) IV   2 g Intravenous Q24H  . chlorhexidine gluconate (SAGE KIT)  15 mL Mouth Rinse BID  . famotidine  20 mg Per Tube BID  . feeding supplement (PRO-STAT SUGAR FREE 64)  60 mL Per Tube TID  . feeding supplement (VITAL HIGH PROTEIN)  1,000 mL Per Tube Q24H  . furosemide  40 mg Intravenous BID  . insulin aspart  0-20 Units Subcutaneous Q4H  . insulin glargine  20 Units Subcutaneous Daily  . metoprolol tartrate  25 mg Oral Q8H  . sennosides  10 mL Per Tube QHS  . sodium chloride flush  10-40 mL Intracatheter Q12H     ROS:                                                                                                                                       History obtained from unobtainable from patient due to mental status    Blood pressure 172/93, pulse 105, temperature 101.4 F (38.6 C), temperature source Oral, resp. rate 18, height 6' (1.829 m), weight 113.2 kg (249 lb 9 oz), SpO2 100 %.   Neurologic Examination:  Neurological Examination Mental Status: Patient does not respond to verbal stimuli.  Does not respond to deep sternal rub.  Does not follow commands.  No verbalizations noted.  Cranial Nerves: II: patient does not respond confrontation bilaterally, pupils right 3 mm, left 3 mm,and reactive bilaterally III,IV,VI: doll's response present bilaterally.  V,VII: corneal reflex present bilaterally  VIII: patient does not respond to verbal stimuli IX,X: gag and cough reflex present Motor: Extremities flaccid throughout.  No spontaneous movement noted.  No purposeful movements noted. Sensory: Does not respond to noxious stimuli in any extremity.        Lab Results: Basic Metabolic Panel:  Recent Labs Lab 01/18/2016 0349 01/21/2016 0410 01/10/16 0500 01/10/16 1140 01/10/16 1807 01/11/16 0010 01/11/16 0437 01/11/16 1826 01/12/16 0435  NA 139 140 142  --   --  141 140   --  148*  K 4.2 4.1 3.7  --   --  4.2 3.9  --  3.9  CL 103 102 113*  --   --  113* 110  --  115*  CO2 22  --  19*  --   --  20* 22  --  25  GLUCOSE 266* 256* 115*  --   --  241* 293*  --  209*  BUN 31* 40* 41*  --   --  53* 57*  --  55*  CREATININE 1.97* 1.90* 2.42*  --   --  2.43* 2.28*  --  1.63*  CALCIUM 8.9  --  7.0*  --   --  8.1* 7.9*  --  8.3*  MG 2.6*  --   --  2.1 2.1  --  2.4 2.7* 2.7*  PHOS  --   --   --  3.0 3.1  --  2.5 1.6* 3.2    Liver Function Tests:  Recent Labs Lab 01/03/2016 0349 01/12/16 0435  AST 472* 47*  ALT 652* 146*  ALKPHOS 62 55  BILITOT 1.1 1.3*  PROT 6.5 6.3*  ALBUMIN 3.7 2.6*   No results for input(s): LIPASE, AMYLASE in the last 168 hours. No results for input(s): AMMONIA in the last 168 hours.  CBC:  Recent Labs Lab 01/25/2016 0349 01/06/2016 0410 01/10/16 0500 01/11/16 0437 01/12/16 0435  WBC 15.7*  --  8.9 8.8 12.9*  NEUTROABS 10.5*  --   --  7.3  --   HGB 15.3 16.7 14.0 12.6* 12.6*  HCT 47.1 49.0 41.9 36.9* 37.6*  MCV 91.3  --  86.0 86.4 87.2  PLT 180  --  153 131* 160    Cardiac Enzymes:  Recent Labs Lab 01/26/2016 0349  TROPONINI 0.26*    Lipid Panel:  Recent Labs Lab 01/05/2016 1245 01/10/16 0453 01/11/16 0438  TRIG 96 88 150*    CBG:  Recent Labs Lab 01/11/16 1557 01/11/16 2004 01/11/16 2326 01/12/16 0434 01/12/16 0736  GLUCAP 228* 207* 228* 214* 203*    Microbiology: Results for orders placed or performed during the hospital encounter of 01/15/2016  MRSA PCR Screening     Status: None   Collection Time: 01/08/2016  8:39 AM  Result Value Ref Range Status   MRSA by PCR NEGATIVE NEGATIVE Final    Comment:        The GeneXpert MRSA Assay (FDA approved for NASAL specimens only), is one component of a comprehensive MRSA colonization surveillance program. It is not intended to diagnose MRSA infection nor to guide or monitor treatment for MRSA infections.   Culture, respiratory (NON-Expectorated)  Status:  None (Preliminary result)   Collection Time: 01/10/16  1:32 AM  Result Value Ref Range Status   Specimen Description TRACHEAL ASPIRATE  Final   Special Requests Normal  Final   Gram Stain   Final    MODERATE WBC PRESENT,BOTH PMN AND MONONUCLEAR FEW GRAM POSITIVE COCCI IN PAIRS    Culture CULTURE REINCUBATED FOR BETTER GROWTH  Final   Report Status PENDING  Incomplete  Culture, blood (routine x 2)     Status: None (Preliminary result)   Collection Time: 01/10/16  1:40 AM  Result Value Ref Range Status   Specimen Description BLOOD LEFT HAND  Final   Special Requests IN PEDIATRIC BOTTLE 2ML  Final   Culture NO GROWTH 1 DAY  Final   Report Status PENDING  Incomplete  Culture, blood (routine x 2)     Status: None (Preliminary result)   Collection Time: 01/10/16  2:00 AM  Result Value Ref Range Status   Specimen Description BLOOD RIGHT HAND  Final   Special Requests IN PEDIATRIC BOTTLE 1ML  Final   Culture NO GROWTH 1 DAY  Final   Report Status PENDING  Incomplete    Coagulation Studies: No results for input(s): LABPROT, INR in the last 72 hours.  Imaging: Mr Herby Abraham Contrast  01/11/2016  CLINICAL DATA:  Admitted 12/31/2015 with cardiac arrest. Encephalopathy. Ventilator support. EXAM: MRI HEAD WITHOUT CONTRAST TECHNIQUE: Multiplanar, multiecho pulse sequences of the brain and surrounding structures were obtained without intravenous contrast. COMPARISON:  CT 01/02/2016 FINDINGS: Diffusion imaging suffers from artifact 3 shows restricted diffusion affecting the occipital cortical and subcortical brain bilaterally, the thalami, the caudate nuclei, an the posterior parietal region bilaterally. Questionable hippocampal abnormality as well. The brain does not show generalized swelling. No evidence of mass lesion, hemorrhage, hydrocephalus or extra-axial collection. No pituitary mass. No inflammatory sinus disease. No skull or skullbase lesion. IMPRESSION: Probable hypoxic ischemic injury  affecting the thalami, caudate nuclei, medial aspects of the occipital and parietal brain. Questionable hippocampal involvement. No swelling or hemorrhage at this time. Electronically Signed   By: Nelson Chimes M.D.   On: 01/11/2016 19:26   Dg Chest Port 1 View  01/12/2016  CLINICAL DATA:  On shortness of breath with ventilator dependence. EXAM: PORTABLE CHEST 1 VIEW COMPARISON:  01/11/2016.  01/08/2016. FINDINGS: 0501 hours. Endotracheal tube 5.1 cm above the base of the carina. The NG tube passes into the stomach although the distal tip position is not included on the film. Leftward patient rotation. Cardiopericardial silhouette is at upper limits of normal for size. The relatively diffuse right-sided airspace disease with slight central predominance persists without substantial change. Left lung remains relatively clear. Patient is status post CABG. Telemetry leads overlie the chest. IMPRESSION: Persistent asymmetric right-sided relatively diffuse airspace disease. Asymmetric pulmonary edema versus diffuse right lung infection. Electronically Signed   By: Misty Stanley M.D.   On: 01/12/2016 07:30   Dg Chest Port 1 View  01/11/2016  CLINICAL DATA:  Acute respiratory failure, hypoxia ; coronary artery disease with previous CABG. EXAM: PORTABLE CHEST 1 VIEW COMPARISON:  Portable chest x-ray of January 09, 2016 FINDINGS: The left lung is well expanded and clear. On the right confluent alveolar opacities have worsened in the perihilar region especially in the mid and lower lung. There is no pleural effusion or pneumothorax. The heart is top-normal in size. The pulmonary vascularity is not engorged. The endotracheal tube tip lies 6.1 cm above the carina. Esophagogastric tube tip projects below  the inferior margin of the image. The mediastinum is normal in width. Sternal wires appear intact. IMPRESSION: Interval worsening of alveolar opacities on the right most compatible with pneumonia though asymmetric pulmonary  edema could produce similar findings. The support tubes are in stable position. Electronically Signed   By: David  Martinique M.D.   On: 01/11/2016 07:06        Assessment/Plan: 65 y/o male with a past medical history of CAD, CHF and ischemic cardiomyopathy was admitted on 01/04/2016 with a prolonged cardiac arrest. Called to evaluate prognosis.  Patient currently rewarmed, Received one dose of fentanyl overnight for vent dyssynchrony   He maintains all brainstem reflexes  - MRI shows areas consistent with hypoxic injury but not extensive and no cerebral edema  Given the MRI results and clinical exam, still would lean towards a poor prognosis for meaningful recovery but would also see how he does clinically in the next 7-14 days.  Will continue to follow

## 2016-01-12 NOTE — Progress Notes (Signed)
ANTICOAGULATION CONSULT NOTE - Follow Up Consult  Pharmacy Consult for Heparin Indication: atrial fibrillation  No Known Allergies  Patient Measurements: Height: 6' (182.9 cm) Weight: 249 lb 9 oz (113.2 kg) IBW/kg (Calculated) : 77.6 Heparin Dosing Weight: 102.7kg  Vital Signs: Temp: 101.4 F (38.6 C) (07/13 0742) Temp Source: Oral (07/13 0742) BP: 172/93 mmHg (07/13 0907) Pulse Rate: 105 (07/13 0907)  Labs:  Recent Labs  01/10/16 0500 01/11/16 0010 01/11/16 0437  01/11/16 0800 01/11/16 1816 01/11/16 2336 01/12/16 0435  HGB 14.0  --  12.6*  --   --   --   --  12.6*  HCT 41.9  --  36.9*  --   --   --   --  37.6*  PLT 153  --  131*  --   --   --   --  160  APTT  --   --   --   --  171* 110*  --   --   HEPARINUNFRC  --   --   --   < > 0.94* 0.59 0.44 0.39  CREATININE 2.42* 2.43* 2.28*  --   --   --   --  1.63*  < > = values in this interval not displayed.  Estimated Creatinine Clearance: 60.2 mL/min (by C-G formula based on Cr of 1.63).   Medications:  Heparin @ 1300 units/hr  Assessment: 65yom on xarelto pta for afib now s/p cardiac arrest and transitioned to IV heparin. Heparin level is therapeutic at 0.39. LFTs, renal function improving. CBC is stable. No bleeding.  Goal of Therapy:  Heparin level 0.3-0.7 units/ml Monitor platelets by anticoagulation protocol: Yes   Plan:  1) Continue heparin at 1300 units/hr 2) Daily heparin level and CBC  Fredrik Rigger 01/12/2016,9:22 AM

## 2016-01-12 NOTE — Progress Notes (Signed)
PULMONARY / CRITICAL CARE MEDICINE   Name: William Pacheco MRN: [id number] DOB: [birth year]    ADMISSION DATE:  01/29/2016 CONSULTATION DATE:  01/27/2016  REFERRING MD:  Dr. Elesa Massed  CHIEF COMPLAINT:  Found down, cardiac arrest  BRIEF: 65 y/o male with CAD, CHF had a cardiac arrest (PEA/Vfib) on 7/10 and received 1 hour, 29 minutes of CPR.     SUBJECTIVE:  Started on antibiotics 7/11  Neuro status poor. Remains GSC 4, MRI completed overnight. Neurology following.   VITAL SIGNS: BP 172/93 mmHg  Pulse 105  Temp(Src) 101.4 F (38.6 C) (Oral)  Resp 18  Ht 6' (1.829 m)  Wt 249 lb 9 oz (113.2 kg)  BMI 33.84 kg/m2  SpO2 100%  HEMODYNAMICS:    VENTILATOR SETTINGS: Vent Mode:  [-] PCV FiO2 (%):  [30 %] 30 % Set Rate:  [14 bmp-26 bmp] 14 bmp PEEP:  [5 cmH20-10 cmH20] 5 cmH20 Plateau Pressure:  [18 cmH20-25 cmH20] 18 cmH20  INTAKE / OUTPUT: I/O last 3 completed shifts: In: 3992.2 [I.V.:1367.2; NG/GT:1600; IV Piggyback:1025] Out: 5175 [Urine:5175]  PHYSICAL EXAMINATION: General:  On vent HENT: NCAT, ETT in place PULM: Coarse rhonchi throughout CV: RRR, no murmur, gallop, rub GI: BS+, soft, nontender,tolerating TF MSK: normal bulk, no bony abnormality Neuro: GCS 4 (E2, V1, M1), bilateral corneals impaired, no gag, weak cough  LABS:  BMET  Recent Labs Lab 01/11/16 0010 01/11/16 0437 01/12/16 0435  NA 141 140 148*  K 4.2 3.9 3.9  CL 113* 110 115*  CO2 20* 22 25  BUN 53* 57* 55*  CREATININE 2.43* 2.28* 1.63*  GLUCOSE 241* 293* 209*    Electrolytes  Recent Labs Lab 01/11/16 0010 01/11/16 0437 01/11/16 1826 01/12/16 0435  CALCIUM 8.1* 7.9*  --  8.3*  MG  --  2.4 2.7* 2.7*  PHOS  --  2.5 1.6* 3.2    CBC  Recent Labs Lab 01/10/16 0500 01/11/16 0437 01/12/16 0435  WBC 8.9 8.8 12.9*  HGB 14.0 12.6* 12.6*  HCT 41.9 36.9* 37.6*  PLT 153 131* 160    Coag's  Recent Labs Lab 01/27/2016 0344 01/11/16 0800 01/11/16 1816  APTT 33 171* 110*  INR  2.17*  --   --     Sepsis Markers  Recent Labs Lab 01/23/2016 0413 01/10/16 0209 01/11/16 0437 01/12/16 0435  LATICACIDVEN 7.02*  --   --   --   PROCALCITON  --  20.82 10.37 5.18    ABG  Recent Labs Lab 01/05/2016 0506 01/20/2016 0611 01/12/16 0400  PHART 7.236* 7.343* 7.471*  PCO2ART 53.0* 36.2 31.4*  PO2ART 37.0* 146.0* 62.3*    Liver Enzymes  Recent Labs Lab 01/20/2016 0349 01/12/16 0435  AST 472* 47*  ALT 652* 146*  ALKPHOS 62 55  BILITOT 1.1 1.3*  ALBUMIN 3.7 2.6*    Cardiac Enzymes  Recent Labs Lab 01/24/2016 0349  TROPONINI 0.26*    Glucose  Recent Labs Lab 01/11/16 1139 01/11/16 1557 01/11/16 2004 01/11/16 2326 01/12/16 0434 01/12/16 0736  GLUCAP 232* 228* 207* 228* 214* 203*    Imaging Mr Brain Wo Contrast  01/11/2016  CLINICAL DATA:  Admitted 01/30/2016 with cardiac arrest. Encephalopathy. Ventilator support. EXAM: MRI HEAD WITHOUT CONTRAST TECHNIQUE: Multiplanar, multiecho pulse sequences of the brain and surrounding structures were obtained without intravenous contrast. COMPARISON:  CT 01/19/2016 FINDINGS: Diffusion imaging suffers from artifact 3 shows restricted diffusion affecting the occipital cortical and subcortical brain bilaterally, the thalami, the caudate nuclei, an the posterior parietal region bilaterally.  Questionable hippocampal abnormality as well. The brain does not show generalized swelling. No evidence of mass lesion, hemorrhage, hydrocephalus or extra-axial collection. No pituitary mass. No inflammatory sinus disease. No skull or skullbase lesion. IMPRESSION: Probable hypoxic ischemic injury affecting the thalami, caudate nuclei, medial aspects of the occipital and parietal brain. Questionable hippocampal involvement. No swelling or hemorrhage at this time. Electronically Signed   By: Paulina Fusi M.D.   On: 01/11/2016 19:26   Dg Chest Port 1 View  01/12/2016  CLINICAL DATA:  On shortness of breath with ventilator dependence.  EXAM: PORTABLE CHEST 1 VIEW COMPARISON:  01/11/2016.  01/10/16. FINDINGS: 0501 hours. Endotracheal tube 5.1 cm above the base of the carina. The NG tube passes into the stomach although the distal tip position is not included on the film. Leftward patient rotation. Cardiopericardial silhouette is at upper limits of normal for size. The relatively diffuse right-sided airspace disease with slight central predominance persists without substantial change. Left lung remains relatively clear. Patient is status post CABG. Telemetry leads overlie the chest. IMPRESSION: Persistent asymmetric right-sided relatively diffuse airspace disease. Asymmetric pulmonary edema versus diffuse right lung infection. Electronically Signed   By: Kennith Center M.D.   On: 01/12/2016 07:30     STUDIES:  CT head 7/10 >> no intracranial acute abnormality; paranasal sinus disease EEG 7/10 > diffuse slowing MRI 7/12 >> Probable hypoxic ischemic injury affecting the thalami, caudate nuclei, medial aspects of the occipital and parietal brain. Questionable hippocampal involvement. No swelling or hemorrhage at this time.  CULTURES: 7/11 blood > NGTD 7/11 resp > GPC in pair>>>  ANTIBIOTICS: 7/11 vanc > 7/11 7/11 cefepime>   SIGNIFICANT EVENTS:   LINES/TUBES: 7/10 ETT >> 7/10 R Fem CVL >>   DISCUSSION: 65 y/o male with an extensive cardiology history including CAD, Cardiomyopathy and atrial fibrillation admitted after a cardiac arrest which was PEA and at times V-fib requiring 6 electric shocks.  His total time of CPR noted to be well over one hour, so likelihood of a meaningful neurologic outcome is near zero.  As of 7/12 he has an abnormal neurologic exam without evidence of purposeful neurologic movement. Neuro exam continues to be poor MRI showing diffuse ischemic injury. Brainstem reflexes remain intact.   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 PVS P:   RASS goal: 0 Fentanyl as needed for vent synchrony MRI completed on 7/12 Neurology following Continues to have no meaningful neurological function Consider palliative care consult  PULMONARY A: Acute respiratory failure with hypoxemia Acute pulmonary edema Possible aspiration pneumonia P:   Continue to wean vent as tolerated  Unable to extubate at this time due to vent requirements and low neurological function Highly unlikely that he would be able to protect his airway VAP prevention bundle Persistent right sided opacities on CXR Continue Cefepime for possible pneumonia  CARDIOVASCULAR A:  Cardiac arrest> initial rhythm uncertain Vfib Ischemic cardiomyopathy Atrial fibrillation: Remains in A fib, rate controlled in 100-110's CAD Troponin elevated Cardiogenic shock > resolved Atrial Fibrillation  P:  Tele monitoring Continue to follow cardiology recommendations Continue Amiodarone for A. Fib Metoprolol increased to  Q 8 per cards.   RENAL A:   AKI due to cardiac arrest > improving Not a candidate for HD given severe neurologic injury  P:   Goal euvolemia Monitor BMET and UOP Continue Foley for accurate I/O Replace electrolytes as needed Continue Lasix Creatinine improving  GASTROINTESTINAL A:   No acute issues P:   Pepcid for stress ulcer prophylaxis Tolerating TF Last BM 7/10 - will add senna for bowel regimen  HEMATOLOGIC A:   Heparin Gtt P:  Heparin gtt started overnight for A. Fib Pharmacy following and adjusting per protocol SCD for DVT prophylaxis  INFECTIOUS A:   Aspiration pneumonia vs HCAP P:   Resend sputum culture Continue cefepime Spiked temperature through cefepime WBC increased 8.8 > 12.9 Consider adding Vanc back - MRSA negative for this admission, but has been positive in the past. Follow fever curve and WBC Pan culture for T > 101.5   ENDOCRINE A:   DM   P:   SSI Remains hyperglycemic-  Increase Lantus 20 units   FAMILY  - Updates: prognosis dismal, family updated 7/11 extensively. Have placed consult to neurology   - Inter-disciplinary family meet or Palliative Care meeting due by: 01/16/16   Attending:  I have seen and examined the patient with nurse practitioner/resident and agree with the note above.  We formulated the plan together and I elicited the following history.    Spiked a fever overnight WBC up  On exam Eye opening to painful stimuli Lungs clear, vent supported breaths RRR, no mgr GI: BS+, soft, nontender  Neuro: no motor response to pain in arms/legs with pain but will move left arm with cough; eye opening to painful stimuli;  MRI: bilateral anoxic injury thalamus, parietal lobe and occipital lobe, perhaps hypothalamus  Anoxic brain injury: I explained to the family that there yet may be improvement, but that based on the MRI and duration of injury he will not be capable of living indepently or functioning normally/near baseline.  They say he has said in the past he doesn't want to live "as a burden".  They are considering options, I suggested withdrawal of care and advised against tracheostomy and PEG tube.  Explained one option may be to try to get to extubation, no re-intubation.  Rest as above  My cc time 40 minutes  Heber Eminence, MD [place] [facility] Pager: [phone] Cell: [phone] After 3pm or if no response, call [phone]

## 2016-01-12 NOTE — Progress Notes (Signed)
ANTICOAGULATION CONSULT NOTE - Follow Up Consult  Pharmacy Consult for Heparin Indication: atrial fibrillation  No Known Allergies  Patient Measurements: Height: 6' (182.9 cm) Weight: 250 lb 10.6 oz (113.7 kg) IBW/kg (Calculated) : 77.6 Heparin Dosing Weight: 102.7kg  Vital Signs: Temp: 101.1 F (38.4 C) (07/12 2340) Temp Source: Axillary (07/12 2340) BP: 151/91 mmHg (07/13 0000) Pulse Rate: 136 (07/13 0000)  Labs:  Recent Labs  2016-02-06 0344  02-06-2016 0349 02-06-2016 0410 01/10/16 0500 01/11/16 0010 01/11/16 0437 01/11/16 0800 01/11/16 1816 01/11/16 2336  HGB  --   < > 15.3 16.7 14.0  --  12.6*  --   --   --   HCT  --   < > 47.1 49.0 41.9  --  36.9*  --   --   --   PLT  --   --  180  --  153  --  131*  --   --   --   APTT 33  --   --   --   --   --   --  171* 110*  --   LABPROT 24.0*  --   --   --   --   --   --   --   --   --   INR 2.17*  --   --   --   --   --   --   --   --   --   HEPARINUNFRC  --   --   --   --   --   --   --  0.94* 0.59 0.44  CREATININE  --   < > 1.97* 1.90* 2.42* 2.43* 2.28*  --   --   --   TROPONINI  --   --  0.26*  --   --   --   --   --   --   --   < > = values in this interval not displayed.  Estimated Creatinine Clearance: 43.2 mL/min (by C-G formula based on Cr of 2.28).   Assessment: 65yom on xarelto pta for afib now s/p cardiac arrest and transitioned to IV heparin 7/12. Initial aPTT and heparin level are elevated at 0.94 and 171 respectively. Unclear when last xarelto dose was taken. aPTT remains elevated and heparin level down to therapeutic x 2. Seems that aPTT might be falsely elevated due to liver shock so will plan to follow heparin level going forward.  Goal of Therapy:  Heparin level 0.3-0.7 units/ml Monitor platelets by anticoagulation protocol: Yes   Plan:  Continue heparin gtt at 1300 units/hr F/u daily heparin level   Christoper Fabian, PharmD, BCPS Clinical pharmacist, pager [phone] 01/12/2016,1:21 AM

## 2016-01-13 ENCOUNTER — Inpatient Hospital Stay (HOSPITAL_COMMUNITY): Payer: 59

## 2016-01-13 ENCOUNTER — Inpatient Hospital Stay (HOSPITAL_COMMUNITY): Admission: RE | Admit: 2016-01-13 | Payer: 59 | Source: Ambulatory Visit

## 2016-01-13 ENCOUNTER — Encounter (HOSPITAL_COMMUNITY): Payer: Self-pay | Admitting: *Deleted

## 2016-01-13 LAB — BASIC METABOLIC PANEL
ANION GAP: 11 (ref 5–15)
BUN: 64 mg/dL — ABNORMAL HIGH (ref 6–20)
CALCIUM: 8.8 mg/dL — AB (ref 8.9–10.3)
CO2: 25 mmol/L (ref 22–32)
CREATININE: 1.63 mg/dL — AB (ref 0.61–1.24)
Chloride: 114 mmol/L — ABNORMAL HIGH (ref 101–111)
GFR, EST AFRICAN AMERICAN: 50 mL/min — AB (ref >60)
GFR, EST NON AFRICAN AMERICAN: 43 mL/min — AB (ref >60)
Glucose, Bld: 212 mg/dL — ABNORMAL HIGH (ref 65–99)
Potassium: 3.5 mmol/L (ref 3.5–5.1)
SODIUM: 150 mmol/L — AB (ref 135–145)

## 2016-01-13 LAB — GLUCOSE, CAPILLARY
GLUCOSE-CAPILLARY: 214 mg/dL — AB (ref 65–99)
GLUCOSE-CAPILLARY: 222 mg/dL — AB (ref 65–99)
GLUCOSE-CAPILLARY: 263 mg/dL — AB (ref 65–99)
Glucose-Capillary: 203 mg/dL — ABNORMAL HIGH (ref 65–99)
Glucose-Capillary: 196 mg/dL — ABNORMAL HIGH (ref 65–99)
Glucose-Capillary: 181 mg/dL — ABNORMAL HIGH (ref 65–99)

## 2016-01-13 LAB — CBC
HEMATOCRIT: 38.3 % — AB (ref 39.0–52.0)
Hemoglobin: 12.7 g/dL — ABNORMAL LOW (ref 13.0–17.0)
MCH: 29.3 pg (ref 26.0–34.0)
MCHC: 33.2 g/dL (ref 30.0–36.0)
MCV: 88.5 fL (ref 78.0–100.0)
PLATELETS: 194 10*3/uL (ref 150–400)
RBC: 4.33 MIL/uL (ref 4.22–5.81)
RDW: 16.1 % — AB (ref 11.5–15.5)
WBC: 13.1 10*3/uL — ABNORMAL HIGH (ref 4.0–10.5)

## 2016-01-13 LAB — PHOSPHORUS: PHOSPHORUS: 2.7 mg/dL (ref 2.5–4.6)

## 2016-01-13 LAB — MAGNESIUM: MAGNESIUM: 3 mg/dL — AB (ref 1.7–2.4)

## 2016-01-13 LAB — HEPARIN LEVEL (UNFRACTIONATED): HEPARIN UNFRACTIONATED: 0.38 [IU]/mL (ref 0.30–0.70)

## 2016-01-13 MED ORDER — ISOSORB DINITRATE-HYDRALAZINE 20-37.5 MG PO TABS
0.5000 | ORAL_TABLET | Freq: Three times a day (TID) | ORAL | Status: DC
  Administered 2016-01-13 (×3): 0.5 via ORAL
  Filled 2016-01-13 (×3): qty 1

## 2016-01-13 MED ORDER — DEXTROSE 5 % IV SOLN
1.0000 g | Freq: Three times a day (TID) | INTRAVENOUS | Status: AC
  Administered 2016-01-13 – 2016-01-18 (×18): 1 g via INTRAVENOUS
  Filled 2016-01-13 (×18): qty 1

## 2016-01-13 MED ORDER — METOPROLOL TARTRATE 50 MG PO TABS
50.0000 mg | ORAL_TABLET | Freq: Once | ORAL | Status: AC
  Administered 2016-01-13: 50 mg via ORAL
  Filled 2016-01-13: qty 1

## 2016-01-13 MED ORDER — METOPROLOL TARTRATE 50 MG PO TABS
100.0000 mg | ORAL_TABLET | Freq: Two times a day (BID) | ORAL | Status: DC
  Administered 2016-01-13: 100 mg via ORAL
  Filled 2016-01-13: qty 2

## 2016-01-13 NOTE — Progress Notes (Signed)
ANTICOAGULATION CONSULT NOTE - Follow Up Consult ANTIBIOTIC CONSULT NOTE - Follow Up Consult  Pharmacy Consult for Heparin and Cefepime Indication: atrial fibrillation and pneumonia  No Known Allergies  Patient Measurements: Height: 6' (182.9 cm) Weight: 237 lb 14 oz (107.9 kg) IBW/kg (Calculated) : 77.6 Heparin Dosing Weight: 102.7kg  Vital Signs: Temp: 99.7 F (37.6 C) (07/14 0700) Temp Source: Axillary (07/14 0700) BP: 164/75 mmHg (07/14 0800) Pulse Rate: 86 (07/14 0800)  Labs:  Recent Labs  01/11/16 0437  01/11/16 0800 01/11/16 1816 01/11/16 2336 01/12/16 0435 01/13/16 0340  HGB 12.6*  --   --   --   --  12.6* 12.7*  HCT 36.9*  --   --   --   --  37.6* 38.3*  PLT 131*  --   --   --   --  160 194  APTT  --   --  171* 110*  --   --   --   LABPROT  --   --   --   --   --  15.6*  --   INR  --   --   --   --   --  1.23  --   HEPARINUNFRC  --   < > 0.94* 0.59 0.44 0.39 0.38  CREATININE 2.28*  --   --   --   --  1.63* 1.63*  < > = values in this interval not displayed.  Estimated Creatinine Clearance: 58.9 mL/min (by C-G formula based on Cr of 1.63).   Medications:  Heparin @ 1300 units/hr  Assessment: 65yom on xarelto pta for afib now s/p cardiac arrest and transitioned to IV heparin. Heparin level is therapeutic at 0.38. CBC is stable. No bleeding.  He also continues on day #4 cefepime for aspiration pneumonia vs HCAP. Tmax 102.1 overnight and WBC trending up - per CCM may add vancomycin back. Renal function improving.  Cefepime 7/11>> Vancomycin 7/11  7/13 TA: abundant GNR 7/11 BCx2: ngtd 7/11 TA: few GPC in pairs >> normal flora 7/10 MRSA PCR neg  Goal of Therapy:  Heparin level 0.3-0.7 units/ml Monitor platelets by anticoagulation protocol: Yes   Plan:  1) Continue heparin at 1300 units/hr 2) Daily heparin level and CBC 3) Increase cefepime to 1g IV q8 for CrCl > 65ml/min  Fredrik Rigger 01/13/2016,8:29 AM

## 2016-01-13 NOTE — Progress Notes (Addendum)
PATIENT ID:  William Pacheco is a 83M with CAD s/p CABG, chronic systolic and diastolic heart failure LVEF 40%, PAD s/p L popliteal endarterectomy and patch angioplasty, non-flow limiting RAS, hyperlipidemia, and paroxysmal atrial fibrillation here with witnessed out of hospital arrest.  SUBJECTIVE:  Intubated.  Off sedation since 01/10/16 at 5am.     PHYSICAL EXAM Filed Vitals:   01/13/16 0400 01/13/16 0500 01/13/16 0600 01/13/16 0700  BP: 174/89 173/81 174/77   Pulse: 95 99 99   Temp: 100 F (37.8 C)   99.7 F (37.6 C)  TempSrc:    Axillary  Resp: Height:      Weight:      SpO2: 100% 98% 100%    General:  Intubated.  Critically ill Neck: JVP 2cm above clavicle at 45 degrees Lungs:  The breath sounds anteriorly. No crackles, rhonchi, or wheezes. Heart:  Regular rate and rhythm. No murmurs, rubs, or gallops. Abdomen:  Soft. Active bowel sounds. Extremities:  Warm. Bilateral upper extremity edema improved but still present.  No LE edema  LABS: Lab Results  Component Value Date   TROPONINI 0.26* 01/14/16   Results for orders placed or performed during the hospital encounter of 01/14/16 (from the past 24 hour(s))  Culture, respiratory (NON-Expectorated)     Status: None (Preliminary result)   Collection Time: 01/12/16 11:20 AM  Result Value Ref Range   Specimen Description TRACHEAL ASPIRATE    Special Requests Normal    Gram Stain      ABUNDANT WBC PRESENT, PREDOMINANTLY PMN FEW SQUAMOUS EPITHELIAL CELLS PRESENT ABUNDANT GRAM NEGATIVE RODS FEW GRAM VARIABLE COCCI    Culture PENDING    Report Status PENDING   Glucose, capillary     Status: Abnormal   Collection Time: 01/12/16 11:25 AM  Result Value Ref Range   Glucose-Capillary 225 (H) 65 - 99 mg/dL   Comment 1 Capillary Specimen   Glucose, capillary     Status: Abnormal   Collection Time: 01/12/16  3:20 PM  Result Value Ref Range   Glucose-Capillary 221 (H) 65 - 99 mg/dL   Comment 1 Capillary Specimen     Glucose, capillary     Status: Abnormal   Collection Time: 01/12/16  8:27 PM  Result Value Ref Range   Glucose-Capillary 225 (H) 65 - 99 mg/dL   Comment 1 Capillary Specimen   Glucose, capillary     Status: Abnormal   Collection Time: 01/13/16 12:53 AM  Result Value Ref Range   Glucose-Capillary 222 (H) 65 - 99 mg/dL   Comment 1 Capillary Specimen   Heparin level (unfractionated)     Status: None   Collection Time: 01/13/16  3:40 AM  Result Value Ref Range   Heparin Unfractionated 0.38 0.30 - 0.70 IU/mL  CBC     Status: Abnormal   Collection Time: 01/13/16  3:40 AM  Result Value Ref Range   WBC 13.1 (H) 4.0 - 10.5 K/uL   RBC 4.33 4.22 - 5.81 MIL/uL   Hemoglobin 12.7 (L) 13.0 - 17.0 g/dL   HCT 16.1 (L) 09.6 - 04.5 %   MCV 88.5 78.0 - 100.0 fL   MCH 29.3 26.0 - 34.0 pg   MCHC 33.2 30.0 - 36.0 g/dL   RDW 40.9 (H) 81.1 - 91.4 %   Platelets 194 150 - 400 K/uL  Basic metabolic panel     Status: Abnormal   Collection Time: 01/13/16  3:40 AM  Result Value Ref Range  Sodium 150 (H) 135 - 145 mmol/L   Potassium 3.5 3.5 - 5.1 mmol/L   Chloride 114 (H) 101 - 111 mmol/L   CO2 25 22 - 32 mmol/L   Glucose, Bld 212 (H) 65 - 99 mg/dL   BUN 64 (H) 6 - 20 mg/dL   Creatinine, Ser 9.14 (H) 0.61 - 1.24 mg/dL   Calcium 8.8 (L) 8.9 - 10.3 mg/dL   GFR calc non Af Amer 43 (L) >60 mL/min   GFR calc Af Amer 50 (L) >60 mL/min   Anion gap 11 5 - 15  Magnesium     Status: Abnormal   Collection Time: 01/13/16  3:40 AM  Result Value Ref Range   Magnesium 3.0 (H) 1.7 - 2.4 mg/dL  Phosphorus     Status: None   Collection Time: 01/13/16  3:40 AM  Result Value Ref Range   Phosphorus 2.7 2.5 - 4.6 mg/dL  Glucose, capillary     Status: Abnormal   Collection Time: 01/13/16  3:46 AM  Result Value Ref Range   Glucose-Capillary 203 (H) 65 - 99 mg/dL   Comment 1 Venous Specimen   Blood gas, arterial     Status: Abnormal (Preliminary result)   Collection Time: 01/13/16  5:50 AM  Result Value Ref Range    FIO2 0.40    O2 Content PENDING L/min   Delivery systems VENTILATOR    Mode PRESSURE CONTROL    LHR 14 resp/min   Peep/cpap 5.0 cm H20   Pressure control 22 cm H20   pCO2 arterial 31.6 (L) 35.0 - 45.0 mmHg   pO2, Arterial 74.7 (L) 80.0 - 100.0 mmHg   Bicarbonate 25.2 (H) 20.0 - 24.0 mEq/L   TCO2 26.1 0 - 100 mmol/L   Acid-Base Excess 2.2 (H) 0.0 - 2.0 mmol/L   O2 Saturation 95.2 %   Patient temperature 98.6    Collection site LEFT    Drawn by [id number]    Sample type ARTERIAL DRAW    Allens test (pass/fail) PASS PASS    Intake/Output Summary (Last 24 hours) at 01/13/16 0818 Last data filed at 01/13/16 0600  Gross per 24 hour  Intake 2123.3 ml  Output   5775 ml  Net -3651.7 ml    Telemetry:  PVCs, Atrial fibrillation with rapid ventricular response converted to sinus rhythm at 2:30 am.  PVCs  Echo 01/11/16: Study Conclusions  - Left ventricle: The cavity size was normal. There was moderate  concentric hypertrophy. Systolic function was severely reduced.  The estimated ejection fraction was in the range of 20% to 25%.  Diffuse hypokinesis. - Aortic valve: Trileaflet; normal thickness leaflets.  Transvalvular velocity was within the normal range. There was no  stenosis. - Aortic root: The aortic root was normal in size. - Mitral valve: There was mild regurgitation. - Right ventricle: Systolic function was mildly reduced. - Right atrium: The atrium was normal in size. - Pulmonary arteries: Systolic pressure was within the normal  range. - Inferior vena cava: The vessel was dilated. The respirophasic  diameter changes were blunted (< 50%), consistent with elevated  central venous pressure. - Pericardium, extracardiac: There was no pericardial effusion.  Impressions:  - Poor study quality, however when compared to the prior study from  09/09/2013 LVEF is now severely impaired estimated at 20-25% with  diffuse hypokinesis.  RV systolic function is at least  mildly impaired.  ASSESSMENT AND PLAN:  Principal Problem:   Cardiac arrest - witnessed, out of hospital arrest Active  Problems:   Atherosclerosis of native arteries of extremity with intermittent claudication (HCC)   PAD - R Fem-Pop, L SFA stent; recent left popliteal endarterectomy with patch angioplasty   Essential hypertension   CAD- CABG x 6 9/08- Myoview 07/1024; Moderate Inf Infarct with very mild Peri-infarct ischemia   Hyperlipidemia with target LDL less than 70   DM type 2, uncontrolled, with neuropathy (HCC)   Anticoagulation adequate, on Xarelto-CHADs VASc=3   PAF (paroxysmal atrial fibrillation) (HCC)   Ischemic cardiomyopathy; EF roughly 40%   Chronic combined systolic and diastolic congestive heart failure, NYHA class 2 (HCC)   VF (ventricular fibrillation) -shocked 6 times   Acute respiratory failure with hypoxemia (HCC)   Acute encephalopathy   Acute on chronic systolic and diastolic heart failure, NYHA class 1 (HCC)   Anoxic brain injury [facility])   # Cardiac arrest: William Pacheco is s/p cardiac arrest and received 90 minutes of CPR in his home. There is no evidence that this was due to ischemia, as his post-arrest EKG did not show any significant ischemic changes. Thus far there has been no meaningful neurologic recovery.  Neurology was consulted and he was found to have hypoxic ischemic of the thalami, caudate nucleus, medial occipital and parietal lobes.  Prognosis seems grim.  Neurology plans to wait 7-14 days.  He remains hemodynamically stable and does not require pressor support.    # Acute on chronic systolic and diastolic heart failure: Last known LVEF 40%.  Echo this admission revealed LVEF 20-25% with diffuse hypokinesis. There is also RV impairment. We will increase metoprolol to 100mg  q12h.and add Bidil.  No additional intervention such as cath or ICD without meaningful neurologic recovery.  He received two doses of lasix IV yesterday and one this AM. BUN starting to  rise.  Will hold off on additional lasix for now.  Likely re-dose tomorrow if renal function is stable.  # Atrial fibrillation with RVR: William Pacheco had an episode of atrial fibrillation with RVR last night.  He was started on heparin for anticoagulation.  He was also started on amiodarone bolus and infusion.  Rates remain poorly-controlled.   Increase metoprolol as above.  Rates remained poorly controlled when in atrial fibrillation. We will switch to metoprolol 100 mg every 12 hours.  He just received 50 mg we will give an additional 50 mg now.  # Hypertensive heart disease: BP is poorly-controlled. Increase metoprolol as above.  Renal function is not at his baseline (1.12). Therefore we will not restart his ARB.  Add bidil 0.5 tablet q8h.   Amayah Staheli C. Duke Salvia, MD, [facility] 01/13/2016 8:18 AM

## 2016-01-13 NOTE — Progress Notes (Signed)
PULMONARY / CRITICAL CARE MEDICINE   Name: William Pacheco MRN: [id number] DOB: [birth year]    ADMISSION DATE:  01/21/2016 CONSULTATION DATE:  01/04/2016  REFERRING MD:  Dr. Elesa Massed  CHIEF COMPLAINT:  Found down, cardiac arrest  BRIEF: 65 y/o male with CAD, CHF had a cardiac arrest (PEA/Vfib) on 7/10 and received 1 hour, 29 minutes of CPR.     SUBJECTIVE:  Fever overnight  VITAL SIGNS: BP 176/74 mmHg  Pulse 94  Temp(Src) 99.7 F (37.6 C) (Axillary)  Resp 18  Ht 6' (1.829 m)  Wt 107.9 kg (237 lb 14 oz)  BMI 32.25 kg/m2  SpO2 100%  HEMODYNAMICS:    VENTILATOR SETTINGS: Vent Mode:  [-] CPAP;PSV FiO2 (%):  [30 %] 30 % Set Rate:  [14 bmp-15 bmp] 15 bmp PEEP:  [5 cmH20] 5 cmH20 Pressure Support:  [5 cmH20-8 cmH20] 8 cmH20 Plateau Pressure:  [17 cmH20-22 cmH20] 22 cmH20  INTAKE / OUTPUT: I/O last 3 completed shifts: In: 3524.1 [I.V.:1069.1; NG/GT:1930; IV Piggyback:525] Out: 7875 [Urine:7875]  PHYSICAL EXAMINATION: General:  On vent HENT: NCAT, ETT in place PULM: Coarse rhonchi throughout CV: RRR, no murmur, gallop, rub GI: BS+, soft, nontender,tolerating TF MSK: normal bulk, no bony abnormality Neuro: weak gag and cough, opens eyes to pain, no withdrawal to painful stimuli  LABS:  BMET  Recent Labs Lab 01/11/16 0437 01/12/16 0435 01/13/16 0340  NA 140 148* 150*  K 3.9 3.9 3.5  CL 110 115* 114*  CO2 22 25 25   BUN 57* 55* 64*  CREATININE 2.28* 1.63* 1.63*  GLUCOSE 293* 209* 212*    Electrolytes  Recent Labs Lab 01/11/16 0437 01/11/16 1826 01/12/16 0435 01/13/16 0340  CALCIUM 7.9*  --  8.3* 8.8*  MG 2.4 2.7* 2.7* 3.0*  PHOS 2.5 1.6* 3.2 2.7    CBC  Recent Labs Lab 01/11/16 0437 01/12/16 0435 01/13/16 0340  WBC 8.8 12.9* 13.1*  HGB 12.6* 12.6* 12.7*  HCT 36.9* 37.6* 38.3*  PLT 131* 160 194    Coag's  Recent Labs Lab 01/11/2016 0344 01/11/16 0800 01/11/16 1816 01/12/16 0435  APTT 33 171* 110*  --   INR 2.17*  --   --  1.23     Sepsis Markers  Recent Labs Lab 01/10/2016 0413 01/10/16 0209 01/11/16 0437 01/12/16 0435  LATICACIDVEN 7.02*  --   --   --   PROCALCITON  --  20.82 10.37 5.18    ABG  Recent Labs Lab 12/31/2015 0506 01/24/2016 0611 01/12/16 0400 01/13/16 0550  PHART 7.236* 7.343* 7.471*  --   PCO2ART 53.0* 36.2 31.4* 31.6*  PO2ART 37.0* 146.0* 62.3* 74.7*    Liver Enzymes  Recent Labs Lab 01/25/2016 0349 01/12/16 0435  AST 472* 47*  ALT 652* 146*  ALKPHOS 62 55  BILITOT 1.1 1.3*  ALBUMIN 3.7 2.6*    Cardiac Enzymes  Recent Labs Lab 01/20/2016 0349  TROPONINI 0.26*    Glucose  Recent Labs Lab 01/12/16 1125 01/12/16 1520 01/12/16 2027 01/13/16 0053 01/13/16 0346 01/13/16 0746  GLUCAP 225* 221* 225* 222* 203* 214*    Imaging Dg Chest Port 1 View  01/13/2016  CLINICAL DATA:  Acute respiratory failure, shortness of breath, CHF, coronary artery disease, peripheral vascular disease EXAM: PORTABLE CHEST 1 VIEW COMPARISON:  Portable chest x-ray of January 12, 2016 FINDINGS: The lungs are well-expanded. Confluent interstitial and alveolar opacities on the right have improved markedly. There is persistent increased density in the left lower lobe medially. The cardiac silhouette remains enlarged.  The pulmonary vascularity is not clearly engorged. The endotracheal tube tip lies 6 cm above the clavicular heads. The esophagogastric tube tip projects below the inferior margin of the image. IMPRESSION: Stable cardiomegaly. Interval improvement in interstitial edema or pneumonia on the right. Persistent left lower lobe atelectasis or pneumonia. The support tubes are in stable position. Electronically Signed   By: David  [place] M.D.   On: 01/13/2016 07:32     STUDIES:  CT head 7/10 >> no intracranial acute abnormality; paranasal sinus disease EEG 7/10 > diffuse slowing MRI 7/12 >> Probable hypoxic ischemic injury affecting the thalami, caudate nuclei, medial aspects of the occipital and  parietal brain. Questionable hippocampal involvement. No swelling or hemorrhage at this time.  CULTURES: 7/11 blood > NGTD 7/11 resp > GPC in pair>>> 7/14 blood >   ANTIBIOTICS: 7/11 vanc > 7/11 7/11 cefepime>   SIGNIFICANT EVENTS:   LINES/TUBES: 7/10 ETT >> 7/10 R Fem CVL >>   DISCUSSION: 65 y/o male with an extensive cardiology history including CAD, Cardiomyopathy and atrial fibrillation admitted after a cardiac arrest which was PEA and at times V-fib requiring 6 electric shocks.  His total time of CPR noted to be well over one hour, so likelihood of a meaningful neurologic outcome is near zero.  As of 7/12 he has an abnormal neurologic exam without evidence of purposeful neurologic movement. Neuro exam continues to be poor MRI showing diffuse ischemic injury. Brainstem reflexes remain intact.   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 PVS P:   RASS goal: 0 Fentanyl as needed for vent synchrony F/U Neurology recs Repeat MRI 7/14  Consider palliative care consult  PULMONARY A: Acute respiratory failure with hypoxemia Acute pulmonary edema Aspiration pneumonia P:   PSV as tolerated Awaiting improvement in mental status prior to extubation VAP prevention bundle Continue Cefepime for pneumonia  CARDIOVASCULAR A:  Cardiac arrest> Vfib Ischemic cardiomyopathy Atrial fibrillation: Remains in A fib, rate controlled in 100-110's CAD Troponin elevated Cardiogenic shock > resolved Atrial Fibrillation  P:  Tele monitoring Continue to follow cardiology recommendations Continue Amiodarone for A. Fib Metoprolol increased to  Q 8 per cards.   RENAL A:   AKI due to cardiac arrest > improving Not a candidate for HD given severe neurologic injury  P:   Monitor BMET and UOP Replace electrolytes as needed   GASTROINTESTINAL A:   No acute issues P:   Pepcid for stress ulcer  prophylaxis Tolerating TF Last BM 7/10 - continue senna for bowel regimen  HEMATOLOGIC A:   Heparin Gtt P:  Heparin gtt started overnight for A. Fib Pharmacy following and adjusting per protocol SCD for DVT prophylaxis  INFECTIOUS A:   Aspiration pneumonia vs HCAP Ongoing fever 7/14 P:   Resend blood culture Continue cefepime Follow fever curve and WBC   ENDOCRINE A:   DM   P:   SSI Remains hyperglycemic- Increase Lantus 20 units   FAMILY  - Updates: prognosis dismal, family updated 7/13 extensively.  Based on his exam and MRI findings I explained to them that he will not return to a normal baseline and will require some degree of help for functioning.  At this point it appears that he will have persistent severe injury but the possibility for some improvement remains.  Neurology advised family on 7/13 that he could continue to improve and stated that full recovery was possible, so they recommended trach/PEG.  I  advised family that I disagree with this assessment and that I feel there is no chance of a full recovery.  I have advised against tracheostomy and PEG placement. Appreciate ongoing neurology assessment and care.  - Inter-disciplinary family meet or Palliative Care meeting due by: 01/16/16   My cc time 35 minutes  Heber Washburn, MD [facility] Pager: [phone] Cell: [phone] After 3pm or if no response, call [phone]

## 2016-01-14 DIAGNOSIS — G9340 Encephalopathy, unspecified: Secondary | ICD-10-CM

## 2016-01-14 LAB — BASIC METABOLIC PANEL
ANION GAP: 7 (ref 5–15)
ANION GAP: 4 — AB (ref 5–15)
BUN: 69 mg/dL — ABNORMAL HIGH (ref 6–20)
BUN: 76 mg/dL — AB (ref 6–20)
CHLORIDE: 128 mmol/L — AB (ref 101–111)
CO2: 26 mmol/L (ref 22–32)
CO2: 26 mmol/L (ref 22–32)
Calcium: 9.2 mg/dL (ref 8.9–10.3)
Calcium: 8.9 mg/dL (ref 8.9–10.3)
Chloride: 126 mmol/L — ABNORMAL HIGH (ref 101–111)
Creatinine, Ser: 1.66 mg/dL — ABNORMAL HIGH (ref 0.61–1.24)
Creatinine, Ser: 1.74 mg/dL — ABNORMAL HIGH (ref 0.61–1.24)
GFR calc Af Amer: 49 mL/min — ABNORMAL LOW (ref >60)
GFR calc Af Amer: 46 mL/min — ABNORMAL LOW (ref >60)
GFR calc non Af Amer: 42 mL/min — ABNORMAL LOW (ref >60)
GFR, EST NON AFRICAN AMERICAN: 40 mL/min — AB (ref >60)
Glucose, Bld: 221 mg/dL — ABNORMAL HIGH (ref 65–99)
Glucose, Bld: 301 mg/dL — ABNORMAL HIGH (ref 65–99)
POTASSIUM: 3.7 mmol/L (ref 3.5–5.1)
POTASSIUM: 3.7 mmol/L (ref 3.5–5.1)
SODIUM: 158 mmol/L — AB (ref 135–145)
Sodium: 159 mmol/L — ABNORMAL HIGH (ref 135–145)

## 2016-01-14 LAB — GLUCOSE, CAPILLARY
GLUCOSE-CAPILLARY: 242 mg/dL — AB (ref 65–99)
GLUCOSE-CAPILLARY: 270 mg/dL — AB (ref 65–99)
GLUCOSE-CAPILLARY: 267 mg/dL — AB (ref 65–99)
GLUCOSE-CAPILLARY: 230 mg/dL — AB (ref 65–99)
Glucose-Capillary: 193 mg/dL — ABNORMAL HIGH (ref 65–99)
Glucose-Capillary: 266 mg/dL — ABNORMAL HIGH (ref 65–99)
Glucose-Capillary: 279 mg/dL — ABNORMAL HIGH (ref 65–99)

## 2016-01-14 LAB — HEPARIN LEVEL (UNFRACTIONATED): Heparin Unfractionated: 0.53 IU/mL (ref 0.30–0.70)

## 2016-01-14 LAB — CBC
HEMATOCRIT: 39.4 % (ref 39.0–52.0)
Hemoglobin: 12.5 g/dL — ABNORMAL LOW (ref 13.0–17.0)
MCH: 28.7 pg (ref 26.0–34.0)
MCHC: 31.7 g/dL (ref 30.0–36.0)
MCV: 90.6 fL (ref 78.0–100.0)
Platelets: 213 10*3/uL (ref 150–400)
RBC: 4.35 MIL/uL (ref 4.22–5.81)
RDW: 16.5 % — AB (ref 11.5–15.5)
WBC: 10.3 10*3/uL (ref 4.0–10.5)

## 2016-01-14 LAB — CALCIUM, IONIZED: CALCIUM, IONIZED, SERUM: 5.1 mg/dL (ref 4.5–5.6)

## 2016-01-14 LAB — CULTURE, RESPIRATORY
CULTURE: NORMAL
SPECIAL REQUESTS: NORMAL

## 2016-01-14 MED ORDER — METOPROLOL SUCCINATE ER 50 MG PO TB24: 50.0000 mg | ORAL_TABLET | Freq: Two times a day (BID) | ORAL | Status: DC

## 2016-01-14 MED ORDER — INSULIN GLARGINE 100 UNIT/ML ~~LOC~~ SOLN
25.0000 [IU] | Freq: Every day | SUBCUTANEOUS | Status: DC
  Administered 2016-01-14: 25 [IU] via SUBCUTANEOUS
  Filled 2016-01-14 (×2): qty 0.25

## 2016-01-14 MED ORDER — FREE WATER
300.0000 mL | Freq: Four times a day (QID) | Status: DC
  Administered 2016-01-14 – 2016-01-15 (×5): 300 mL

## 2016-01-14 MED ORDER — ISOSORB DINITRATE-HYDRALAZINE 20-37.5 MG PO TABS
1.0000 | ORAL_TABLET | Freq: Three times a day (TID) | ORAL | Status: DC
  Administered 2016-01-14 (×3): 1 via ORAL
  Filled 2016-01-14 (×3): qty 1

## 2016-01-14 MED ORDER — CARVEDILOL 12.5 MG PO TABS
12.5000 mg | ORAL_TABLET | Freq: Two times a day (BID) | ORAL | Status: DC
  Administered 2016-01-14 – 2016-01-21 (×15): 12.5 mg via ORAL
  Filled 2016-01-14 (×15): qty 1

## 2016-01-14 NOTE — Progress Notes (Signed)
PULMONARY / CRITICAL CARE MEDICINE   Name: William Pacheco MRN: [id number] DOB: 1950-09-29    ADMISSION DATE:  01-13-16 CONSULTATION DATE:  13-Jan-2016  REFERRING MD:  Dr. Elesa Massed  CHIEF COMPLAINT:  Found down, cardiac arrest  BRIEF: 65 y/o male with CAD, CHF had a cardiac arrest (PEA/Vfib) on 7/10 and received 1 hour, 29 minutes of CPR.     SUBJECTIVE:  Continued fevers. Otherwise no major events overnight  VITAL SIGNS: BP 176/83 mmHg  Pulse 96  Temp(Src) 101.5 F (38.6 C) (Axillary)  Resp 21  Ht 6' (1.829 m)  Wt 233 lb 0.4 oz (105.7 kg)  BMI 31.60 kg/m2  SpO2 95%  HEMODYNAMICS:    VENTILATOR SETTINGS: Vent Mode:  [-] PCV FiO2 (%):  [30 %-40 %] 40 % Set Rate:  [14 bmp] 14 bmp PEEP:  [5 cmH20] 5 cmH20 Pressure Support:  [8 cmH20] 8 cmH20 Plateau Pressure:  [17 cmH20-22 cmH20] 18 cmH20  INTAKE / OUTPUT: I/O last 3 completed shifts: In: 3466.2 [I.V.:1219.2; NG/GT:1997; IV Piggyback:250] Out: 7525 [Urine:7525]  PHYSICAL EXAMINATION: General:  No distress Neuro: Not responsive to stimulation. No change in neuro exam HENT: ETT in place PULM: Clear, anteriorly CV: RRR, no MRG GI: BS+, soft, nontender, non distended MSK: normal tone and bulk Skin: No rash  LABS:  BMET  Recent Labs Lab 01/12/16 0435 01/13/16 0340 01/14/16 0350  NA 148* 150* 159*  K 3.9 3.5 3.7  CL 115* 114* 126*  CO2 BUN 55* 64* 69*  CREATININE 1.63* 1.63* 1.66*  GLUCOSE 209* 212* 221*    Electrolytes  Recent Labs Lab 01/11/16 1826 01/12/16 0435 01/13/16 0340 01/14/16 0350  CALCIUM  --  8.3* 8.8* 9.2  MG 2.7* 2.7* 3.0*  --   PHOS 1.6* 3.2 2.7  --     CBC  Recent Labs Lab 01/12/16 0435 01/13/16 0340 01/14/16 0350  WBC 12.9* 13.1* 10.3  HGB 12.6* 12.7* 12.5*  HCT 37.6* 38.3* 39.4  PLT 160 194 213    Coag's  Recent Labs Lab 2016/01/13 0344 01/11/16 0800 01/11/16 1816 01/12/16 0435  APTT 33 171* 110*  --   INR 2.17*  --   --  1.23    Sepsis  Markers  Recent Labs Lab 01/13/2016 0413 01/10/16 0209 01/11/16 0437 01/12/16 0435  LATICACIDVEN 7.02*  --   --   --   PROCALCITON  --  20.82 10.37 5.18    ABG  Recent Labs Lab 2016/01/13 0506 2016-01-13 0611 01/12/16 0400 01/13/16 0550  PHART 7.236* 7.343* 7.471*  --   PCO2ART 53.0* 36.2 31.4* 31.6*  PO2ART 37.0* 146.0* 62.3* 74.7*    Liver Enzymes  Recent Labs Lab 13-Jan-2016 0349 01/12/16 0435  AST 472* 47*  ALT 652* 146*  ALKPHOS 62 55  BILITOT 1.1 1.3*  ALBUMIN 3.7 2.6*    Cardiac Enzymes  Recent Labs Lab 01-13-2016 0349  TROPONINI 0.26*    Glucose  Recent Labs Lab 01/13/16 1232 01/13/16 1721 01/13/16 2010 01/13/16 2351 01/14/16 0345 01/14/16 0805  GLUCAP 196* 181* 263* 266* 193* 242*    Imaging Mr Brain W Wo Contrast  01/13/2016  CLINICAL DATA:  Possible hypoxic injury.  Patient on ventilator. EXAM: MRI HEAD WITHOUT AND WITH CONTRAST TECHNIQUE: Multiplanar, multiecho pulse sequences of the brain and surrounding structures were obtained without and with intravenous contrast. CONTRAST:  20 mL IV MultiHance COMPARISON:  Brain MRI 01/11/2016 FINDINGS: Brain Parenchyma: There is diffusion restriction within the bilateral occipital cortices, the  bilateral thalami and caudate nuclei, unchanged. Possible diffusion restriction within the right greater than left hippocampi is unchanged. No evidence of acute hemorrhage. No abnormal parenchymal contrast enhancement. No mass lesion or midline shift. The major intracranial flow voids are preserved. The midline structures are normal. Ventricles, Sulci and Extra-axial Spaces: Normal for age. No extra-axial collection. Paranasal Sinuses and Mastoids: No fluid levels or advanced mucosal thickening. Orbits: Normal. Bones and Soft Tissues: The visualized skull base, calvarium and extracranial soft tissues are normal. IMPRESSION: Unchanged distribution of diffusion restriction involving the bilateral occipital lobe cortices,  bilateral thalami and caudate nuclei with possible restricted diffusion in the right greater than left hippocampi. The pattern is again suggestive of hypoxic ischemic injury, particularly in the given clinical context. Electronically Signed   By: Deatra Robinson M.D.   On: 01/13/2016 17:33    STUDIES:  CT head 7/10 >> no intracranial acute abnormality; paranasal sinus disease EEG 7/10 > diffuse slowing MRI 7/12 >> Probable hypoxic ischemic injury affecting the thalami, caudate nuclei, medial aspects of the occipital and parietal brain. Questionable hippocampal involvement. No swelling or hemorrhage at this time. MRI 7/14 > Unchanged. Still shows anoxic injury  CULTURES: 7/11 blood > NGTD 7/11 resp > GPC in pair>>> 7/14 blood >   ANTIBIOTICS: 7/11 vanc > 7/11 7/11 cefepime>   SIGNIFICANT EVENTS:  LINES/TUBES: 7/10 ETT >> 7/10 R Fem CVL >>   DISCUSSION: 65 y/o male with an extensive cardiology history including CAD, Cardiomyopathy and atrial fibrillation admitted after a cardiac arrest which was PEA and at times V-fib requiring 6 electric shocks.  His total time of CPR noted to be well over one hour, so likelihood of a meaningful neurologic outcome is near zero.  As of 7/12 he has an abnormal neurologic exam without evidence of purposeful neurologic movement. Neuro exam continues to be poor MRI showing diffuse ischemic injury. Brainstem reflexes remain intact.   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 PVS P:   RASS goal: 0 Fentanyl as needed for vent synchrony F/U Neurology recs   PULMONARY A: Acute respiratory failure with hypoxemia Acute pulmonary edema Aspiration pneumonia P:   PSV as tolerated Awaiting improvement in mental status prior to extubation VAP prevention bundle Continue Cefepime for pneumonia  CARDIOVASCULAR A:  Cardiac arrest> Vfib Ischemic cardiomyopathy Atrial  fibrillation: Remains in A fib, rate controlled in 100-110's CAD Troponin elevated Cardiogenic shock > resolved Atrial Fibrillation  P:  Tele monitoring Continue to follow cardiology recommendations Continue Amiodarone, Coreg  RENAL A:   AKI due to cardiac arrest > improving Not a candidate for HD given severe neurologic injury Hypernatremia P:   Monitor BMET and UOP Replace electrolytes as needed Started free water  GASTROINTESTINAL A:   No acute issues P:   Pepcid for stress ulcer prophylaxis Tolerating TF Continue senna for bowel regimen  HEMATOLOGIC A:   Heparin Gtt P:  Heparin gtt started for A. Fib Pharmacy following and adjusting per protocol SCD for DVT prophylaxis  INFECTIOUS A:   Aspiration pneumonia vs HCAP Ongoing fever 7/14 P:   Continue cefepime Follow fever curve and WBC  ENDOCRINE A:   DM   P:   SSI Increase lantus to 25  FAMILY  - Updates: prognosis continues to be dismal. Noted ongoing discussions about goals of care. Prognosis for neuro recovery are dismal. He would not be a good candidate for a trach/peg. No family at bedside  7/15.   - Inter-disciplinary family meet or Palliative Care meeting due by: 01/16/16  Critical care time- 35 mins.  Chilton Greathouse MD [facility] Pager [phone] If no answer or after 3pm call: [phone] 01/14/2016, 8:42 AM

## 2016-01-14 NOTE — Progress Notes (Signed)
ANTICOAGULATION CONSULT NOTE - Follow Up Consult  Pharmacy Consult for Heparin Indication: atrial fibrillation  No Known Allergies  Patient Measurements: Height: 6' (182.9 cm) Weight: 233 lb 0.4 oz (105.7 kg) IBW/kg (Calculated) : 77.6 Heparin Dosing Weight: 102.7kg  Vital Signs: Temp: 101.5 F (38.6 C) (07/15 0751) Temp Source: Axillary (07/15 0751) BP: 153/84 mmHg (07/15 0842) Pulse Rate: 98 (07/15 0842)  Labs:  Recent Labs  01/11/16 1816  01/12/16 0435 01/13/16 0340 01/14/16 0350  HGB  --   < > 12.6* 12.7* 12.5*  HCT  --   --  37.6* 38.3* 39.4  PLT  --   --  160 194 213  APTT 110*  --   --   --   --   LABPROT  --   --  15.6*  --   --   INR  --   --  1.23  --   --   HEPARINUNFRC 0.59  < > 0.39 0.38 0.53  CREATININE  --   --  1.63* 1.63* 1.66*  < > = values in this interval not displayed.  Estimated Creatinine Clearance: 57.2 mL/min (by C-G formula based on Cr of 1.66).   Medications:  Heparin @ 1300 units/hr  Assessment: 65yom on xarelto pta for afib now s/p cardiac arrest and transitioned to IV heparin. Heparin level is therapeutic at 0.53. CBC is stable. No bleeding.  Goal of Therapy:  Heparin level 0.3-0.7 units/ml Monitor platelets by anticoagulation protocol: Yes   Plan:  1) Continue heparin at 1300 units/hr 2) Daily heparin level and CBC   Sidonia Nutter K. Bonnye Fava, PharmD, BCPS, CPP Clinical Pharmacist Pager: [phone] Phone: [phone] 01/14/2016 8:45 AM

## 2016-01-14 NOTE — Progress Notes (Signed)
Subjective:  William Pacheco is day #5 s/p hypoxic event.  Have discussed his case with his nurse and ICU treating team.  Exam: Filed Vitals:   01/14/16 0700 01/14/16 0751  BP: 176/83   Pulse: 93 96  Temp:  101.5 F (38.6 C)  Resp: 20 21    HEENT-  Normocephalic, no lesions, without obvious abnormality.  Normal external eye and conjunctiva.  Normal TM's bilaterally.  Normal auditory canals and external ears. Normal external nose, mucus membranes and septum.  Normal pharynx. Cardiovascular- regular rate and rhythm, S1, S2 normal, no murmur, click, rub or gallop, pulses palpable throughout   Lungs- chest clear, no wheezing, rales, normal symmetric air entry, Heart exam - S1, S2 normal, no murmur, no gallop, rate regular Abdomen- soft, non-tender; bowel sounds normal; no masses,  no organomegaly Extremities- less then 2 second capillary refill Lymph-no adenopathy palpable Musculoskeletal-no joint tenderness, deformity or swelling Skin-warm and dry, no hyperpigmentation, vitiligo, or suspicious lesions    Gen: In bed, intubated MS: no commands CN: opens eyes, pupils reactive Motor: no movements Sensory: pupils dilate slightly to dps   Pertinent Labs/Diagnostics: reviewed   Impression:   Day #5 s/p hpyoxic event:  Narinder continues to remain minimally responsive.  There is evidence of limited brainstem activity.  He has some spontaneous eye opening and closing, pupils are reactive.  There is occasional respiratory effort.  There is no movement of the extremities.  The prognosis remains extremely guarded, given no significant improvement in the exam.  The repeat MRI revealed minimal change from baseline.  Would consider discussion of withdrawal of care if no change over the weekend.   Recommendations:  1) Will follow.   Loic Hobin A. Hilda Blades, M.D. Neurohospitalist Phone: [phone]   01/14/2016, 8:12 AM

## 2016-01-14 NOTE — Progress Notes (Addendum)
Patient ID: William Pacheco, male   DOB: 1950-08-18, 65 y.o.   MRN: [id number]   SUBJECTIVE: Febrile overnight to 102.6.  He remains on vent, no sedation, not responsive. Excellent UOP.  Sodium to 159. He is in NSR.   MRI (7/14) with evidence for hypoxic ischemic injury.  Scheduled Meds: . antiseptic oral rinse  7 mL Mouth Rinse 10 times per day  . carvedilol  12.5 mg Oral BID WC  . ceFEPime (MAXIPIME) IV  1 g Intravenous Q8H  . chlorhexidine gluconate (SAGE KIT)  15 mL Mouth Rinse BID  . famotidine  20 mg Per Tube BID  . feeding supplement (PRO-STAT SUGAR FREE 64)  60 mL Per Tube TID  . feeding supplement (VITAL HIGH PROTEIN)  1,000 mL Per Tube Q24H  . free water  300 mL Per Tube Q6H  . insulin aspart  0-20 Units Subcutaneous Q4H  . insulin glargine  20 Units Subcutaneous Daily  . isosorbide-hydrALAZINE  1 tablet Oral TID  . sennosides  10 mL Per Tube QHS  . sodium chloride flush  10-40 mL Intracatheter Q12H   Continuous Infusions: . sodium chloride 10 mL/hr at 01/14/16 0000  . amiodarone 30 mg/hr (01/13/16 2348)  . heparin 1,300 Units/hr (01/14/16 0630)  . norepinephrine (LEVOPHED) Adult infusion Stopped (01/10/16 0444)   PRN Meds:.sodium chloride, acetaminophen, albuterol, fentaNYL (SUBLIMAZE) injection, hydrALAZINE, metoprolol, sodium chloride flush    Filed Vitals:   01/14/16 0500 01/14/16 0600 01/14/16 0700 01/14/16 0751  BP: 149/75 155/74 176/83   Pulse: 95 95 93 96  Temp:    101.5 F (38.6 C)  TempSrc:    Axillary  Resp: _0 Height:      Weight: 233 lb 0.4 oz (105.7 kg)     SpO2: 97% 97% 95% 95%    Intake/Output Summary (Last 24 hours) at 01/14/16 0820 Last data filed at 01/14/16 0700  Gross per 24 hour  Intake 2210.1 ml  Output   4270 ml  Net -2059.9 ml    LABS: Basic Metabolic Panel:  Recent Labs  01/12/16 0435 01/13/16 0340 01/14/16 0350  NA 148* 150* 159*  K 3.9 3.5 3.7  CL 115* 114* 126*  CO2 _1 GLUCOSE 209* 212* 221*  BUN  55* 64* 69*  CREATININE 1.63* 1.63* 1.66*  CALCIUM 8.3* 8.8* 9.2  MG 2.7* 3.0*  --   PHOS 3.2 2.7  --    Liver Function Tests:  Recent Labs  01/12/16 0435  AST 47*  ALT 146*  ALKPHOS 55  BILITOT 1.3*  PROT 6.3*  ALBUMIN 2.6*   No results for input(s): LIPASE, AMYLASE in the last 72 hours. CBC:  Recent Labs  01/13/16 0340 01/14/16 0350  WBC 13.1* 10.3  HGB 12.7* 12.5*  HCT 38.3* 39.4  MCV 88.5 90.6  PLT 194 213   Cardiac Enzymes: No results for input(s): CKTOTAL, CKMB, CKMBINDEX, TROPONINI in the last 72 hours. BNP: Invalid input(s): POCBNP D-Dimer: No results for input(s): DDIMER in the last 72 hours. Hemoglobin A1C: No results for input(s): HGBA1C in the last 72 hours. Fasting Lipid Panel: No results for input(s): CHOL, HDL, LDLCALC, TRIG, CHOLHDL, LDLDIRECT in the last 72 hours. Thyroid Function Tests: No results for input(s): TSH, T4TOTAL, T3FREE, THYROIDAB in the last 72 hours.  Invalid input(s): FREET3 Anemia Panel: No results for input(s): VITAMINB12, FOLATE, FERRITIN, TIBC, IRON, RETICCTPCT in the last 72 hours.  RADIOLOGY: Ct Head Wo Contrast  01/21/2016  CLINICAL DATA:  Two pain following fall EXAM: CT HEAD WITHOUT CONTRAST TECHNIQUE: Contiguous axial images were obtained from the base of the skull through the vertex without intravenous contrast. COMPARISON:  None. FINDINGS: Brain: The ventricles are normal in size and configuration. There is no intracranial mass, hemorrhage, extra-axial fluid collection, or midline shift. Gray-white compartments appear normal. No acute infarct is evident. Vascular: There is calcification in the cavernous carotid artery regions bilaterally. Skull: There is a left parietal occipital region scalp hematoma. The bony calvarium appears intact. Sinuses/Orbits: Orbits appear symmetric bilaterally. There is opacification of multiple ethmoid air cells bilaterally. There is mucosal thickening in the superior left maxillary antrum as  well as in the anterior right sphenoid sinus region. A smaller fluid levels noted right inferior frontal sinus region. There is diffuse nasal turbinate edema with obstruction of both nares. Other: Mastoid air cells are clear. IMPRESSION: No intracranial mass, hemorrhage, or extra-axial fluid collection. No focal infarct evident. Right parieto-occipital scalp hematoma.  No fracture. Multifocal paranasal sinus disease. Obstruction of both nares with apparent diffuse nares edema. Electronically Signed   By: Lowella Grip III M.D.   On: 01/13/2016 08:29   Mr Brain Wo Contrast  01/11/2016  CLINICAL DATA:  Admitted 01/08/2016 with cardiac arrest. Encephalopathy. Ventilator support. EXAM: MRI HEAD WITHOUT CONTRAST TECHNIQUE: Multiplanar, multiecho pulse sequences of the brain and surrounding structures were obtained without intravenous contrast. COMPARISON:  CT 01/02/2016 FINDINGS: Diffusion imaging suffers from artifact 3 shows restricted diffusion affecting the occipital cortical and subcortical brain bilaterally, the thalami, the caudate nuclei, an the posterior parietal region bilaterally. Questionable hippocampal abnormality as well. The brain does not show generalized swelling. No evidence of mass lesion, hemorrhage, hydrocephalus or extra-axial collection. No pituitary mass. No inflammatory sinus disease. No skull or skullbase lesion. IMPRESSION: Probable hypoxic ischemic injury affecting the thalami, caudate nuclei, medial aspects of the occipital and parietal brain. Questionable hippocampal involvement. No swelling or hemorrhage at this time. Electronically Signed   By: Nelson Chimes M.D.   On: 01/11/2016 19:26   Mr Jeri Cos JJ Contrast  01/13/2016  CLINICAL DATA:  Possible hypoxic injury.  Patient on ventilator. EXAM: MRI HEAD WITHOUT AND WITH CONTRAST TECHNIQUE: Multiplanar, multiecho pulse sequences of the brain and surrounding structures were obtained without and with intravenous contrast. CONTRAST:   20 mL IV MultiHance COMPARISON:  Brain MRI 01/11/2016 FINDINGS: Brain Parenchyma: There is diffusion restriction within the bilateral occipital cortices, the bilateral thalami and caudate nuclei, unchanged. Possible diffusion restriction within the right greater than left hippocampi is unchanged. No evidence of acute hemorrhage. No abnormal parenchymal contrast enhancement. No mass lesion or midline shift. The major intracranial flow voids are preserved. The midline structures are normal. Ventricles, Sulci and Extra-axial Spaces: Normal for age. No extra-axial collection. Paranasal Sinuses and Mastoids: No fluid levels or advanced mucosal thickening. Orbits: Normal. Bones and Soft Tissues: The visualized skull base, calvarium and extracranial soft tissues are normal. IMPRESSION: Unchanged distribution of diffusion restriction involving the bilateral occipital lobe cortices, bilateral thalami and caudate nuclei with possible restricted diffusion in the right greater than left hippocampi. The pattern is again suggestive of hypoxic ischemic injury, particularly in the given clinical context. Electronically Signed   By: Ulyses Jarred M.D.   On: 01/13/2016 17:33   Dg Chest Port 1 View  01/13/2016  CLINICAL DATA:  Acute respiratory failure, shortness of breath, CHF, coronary artery disease, peripheral vascular disease EXAM: PORTABLE CHEST 1 VIEW COMPARISON:  Portable chest x-ray of January 12, 2016 FINDINGS:  The lungs are well-expanded. Confluent interstitial and alveolar opacities on the right have improved markedly. There is persistent increased density in the left lower lobe medially. The cardiac silhouette remains enlarged. The pulmonary vascularity is not clearly engorged. The endotracheal tube tip lies 6 cm above the clavicular heads. The esophagogastric tube tip projects below the inferior margin of the image. IMPRESSION: Stable cardiomegaly. Interval improvement in interstitial edema or pneumonia on the right.  Persistent left lower lobe atelectasis or pneumonia. The support tubes are in stable position. Electronically Signed   By: David  Martinique M.D.   On: 01/13/2016 07:32   Dg Chest Port 1 View  01/12/2016  CLINICAL DATA:  On shortness of breath with ventilator dependence. EXAM: PORTABLE CHEST 1 VIEW COMPARISON:  01/11/2016.  01/14/2016. FINDINGS: 0501 hours. Endotracheal tube 5.1 cm above the base of the carina. The NG tube passes into the stomach although the distal tip position is not included on the film. Leftward patient rotation. Cardiopericardial silhouette is at upper limits of normal for size. The relatively diffuse right-sided airspace disease with slight central predominance persists without substantial change. Left lung remains relatively clear. Patient is status post CABG. Telemetry leads overlie the chest. IMPRESSION: Persistent asymmetric right-sided relatively diffuse airspace disease. Asymmetric pulmonary edema versus diffuse right lung infection. Electronically Signed   By: Misty Stanley M.D.   On: 01/12/2016 07:30   Dg Chest Port 1 View  01/11/2016  CLINICAL DATA:  Acute respiratory failure, hypoxia ; coronary artery disease with previous CABG. EXAM: PORTABLE CHEST 1 VIEW COMPARISON:  Portable chest x-ray of January 09, 2016 FINDINGS: The left lung is well expanded and clear. On the right confluent alveolar opacities have worsened in the perihilar region especially in the mid and lower lung. There is no pleural effusion or pneumothorax. The heart is top-normal in size. The pulmonary vascularity is not engorged. The endotracheal tube tip lies 6.1 cm above the carina. Esophagogastric tube tip projects below the inferior margin of the image. The mediastinum is normal in width. Sternal wires appear intact. IMPRESSION: Interval worsening of alveolar opacities on the right most compatible with pneumonia though asymmetric pulmonary edema could produce similar findings. The support tubes are in stable  position. Electronically Signed   By: David  Martinique M.D.   On: 01/11/2016 07:06   Dg Chest Portable 1 View  01/21/2016  CLINICAL DATA:  Post intubation EXAM: PORTABLE CHEST 1 VIEW COMPARISON:  09/14/2013 FINDINGS: Endotracheal tube placed with tip measuring 4.3 cm above the carina. Enteric tube tip is off the field of view but below the left hemidiaphragm, likely in the distal stomach. Shallow inspiration. Heart size and pulmonary vascularity are normal. Perihilar infiltration is suggested on the right possibly due to asymmetric edema or pneumonia. No blunting of costophrenic angles. No pneumothorax. Postoperative changes in the mediastinum. IMPRESSION: Appliances appear in satisfactory position. Right perihilar infiltration or edema. Electronically Signed   By: Lucienne Capers M.D.   On: 01/18/2016 04:15    PHYSICAL EXAM General: Intubated Neck: No JVD, no thyromegaly or thyroid nodule.  Lungs: Decreased breath sounds at bases. CV: Nondisplaced PMI.  Heart regular S1/S2, no S3/S4, no murmur.  No peripheral edema.   Abdomen: Soft, no hepatosplenomegaly, no distention.  Neurologic: Not responsive Extremities: No clubbing or cyanosis.   TELEMETRY: Reviewed telemetry pt in NSR  ASSESSMENT AND PLAN: 65 yo with history of CAD s/p CABG, PAD, and ischemic cardiomyopathy had witnessed out of hospital cardiac arrest and 1.5 hours CPR.  1.  Cardiac arrest: Witnessed, 1.5 hours CPR.  PEA/vfib.  No STEMI by ECG.   2. Atrial fibrillation: Paroxysmal.  In afib earlier in hospitalization with RVR. Now in NSR on amiodarone gtt + heparin gtt.  - Continue amiodarone + heparin.  - Rather than high dose metoprolol tartrate will convert over to Coreg 12.5 mg bid.  3. Acute systolic CHF: Ischemic cardiomyopathy.  EF down to 20-25% on echo from 40% prior.  ?Stunning related to cardiac arrest versus new event.  - If he has meaningful neurologic recovery, should have cardiac catheterization with ICD if no coronary  revascularization.  - Increase Bidil to 1 tab tid with elevated BP. - Change metoprolol tartrate to Coreg as above.  - He is auto-diuresing with no Lasix, I/Os net negative.  Does not appear volume overloaded.  4. CAD: S/p CABG.  No STEMI. As above, if he has recovery will need cath.  5. ID: Concern for aspiration PNA versus HCAP. Febrile today. On cefepime. Per CCM.  6. Hypernatremia: Add free water boluses.  7. Neuro: Suspect anoxic injury, MRI consistent with this. No meaningful neurologic recovery at this point.  Suspect prognosis is increasingly poor.  Neurology following.  8. AKI: Creatinine stable.   Loralie Champagne 01/14/2016 8:29 AM

## 2016-01-14 NOTE — Progress Notes (Signed)
RN walked into patient's room around 0600, pt seemed to be hiccupping except this time RN noticed facial, arm and some leg twitching. This lasted approx 2 minutes then stopped. Passed info along to day shift nurse.

## 2016-01-15 LAB — GLUCOSE, CAPILLARY
GLUCOSE-CAPILLARY: 238 mg/dL — AB (ref 65–99)
GLUCOSE-CAPILLARY: 220 mg/dL — AB (ref 65–99)
GLUCOSE-CAPILLARY: 260 mg/dL — AB (ref 65–99)
GLUCOSE-CAPILLARY: 289 mg/dL — AB (ref 65–99)
Glucose-Capillary: 296 mg/dL — ABNORMAL HIGH (ref 65–99)

## 2016-01-15 LAB — BASIC METABOLIC PANEL
Anion gap: 6 (ref 5–15)
BUN: 77 mg/dL — ABNORMAL HIGH (ref 6–20)
CHLORIDE: 129 mmol/L — AB (ref 101–111)
CO2: 26 mmol/L (ref 22–32)
Calcium: 9.1 mg/dL (ref 8.9–10.3)
Creatinine, Ser: 1.66 mg/dL — ABNORMAL HIGH (ref 0.61–1.24)
GFR calc non Af Amer: 42 mL/min — ABNORMAL LOW (ref >60)
GFR, EST AFRICAN AMERICAN: 49 mL/min — AB (ref >60)
Glucose, Bld: 257 mg/dL — ABNORMAL HIGH (ref 65–99)
POTASSIUM: 3.8 mmol/L (ref 3.5–5.1)
SODIUM: 161 mmol/L — AB (ref 135–145)

## 2016-01-15 LAB — CBC
HEMATOCRIT: 38.2 % — AB (ref 39.0–52.0)
HEMOGLOBIN: 12.1 g/dL — AB (ref 13.0–17.0)
MCH: 29.2 pg (ref 26.0–34.0)
MCHC: 31.7 g/dL (ref 30.0–36.0)
MCV: 92 fL (ref 78.0–100.0)
Platelets: 214 10*3/uL (ref 150–400)
RBC: 4.15 MIL/uL — AB (ref 4.22–5.81)
RDW: 16.7 % — ABNORMAL HIGH (ref 11.5–15.5)
WBC: 10.2 10*3/uL (ref 4.0–10.5)

## 2016-01-15 LAB — HEPARIN LEVEL (UNFRACTIONATED)
HEPARIN UNFRACTIONATED: 0.84 [IU]/mL — AB (ref 0.30–0.70)
HEPARIN UNFRACTIONATED: 0.83 [IU]/mL — AB (ref 0.30–0.70)
HEPARIN UNFRACTIONATED: 0.66 [IU]/mL (ref 0.30–0.70)

## 2016-01-15 LAB — CULTURE, BLOOD (ROUTINE X 2)
CULTURE: NO GROWTH
Culture: NO GROWTH

## 2016-01-15 MED ORDER — ISOSORB DINITRATE-HYDRALAZINE 20-37.5 MG PO TABS
2.0000 | ORAL_TABLET | Freq: Three times a day (TID) | ORAL | Status: DC
  Administered 2016-01-15 – 2016-01-21 (×19): 2 via ORAL
  Filled 2016-01-15 (×19): qty 2

## 2016-01-15 MED ORDER — DEXTROSE 5 % IV BOLUS
75.0000 mL | Freq: Once | INTRAVENOUS | Status: AC
  Administered 2016-01-15: 75 mL via INTRAVENOUS

## 2016-01-15 MED ORDER — FREE WATER
400.0000 mL | Status: DC
  Administered 2016-01-15 – 2016-01-21 (×36): 400 mL

## 2016-01-15 MED ORDER — INSULIN GLARGINE 100 UNIT/ML ~~LOC~~ SOLN
15.0000 [IU] | Freq: Two times a day (BID) | SUBCUTANEOUS | Status: DC
  Administered 2016-01-15 – 2016-01-18 (×7): 15 [IU] via SUBCUTANEOUS
  Filled 2016-01-15 (×8): qty 0.15

## 2016-01-15 MED ORDER — HEPARIN (PORCINE) IN NACL 100-0.45 UNIT/ML-% IJ SOLN
1000.0000 [IU]/h | INTRAMUSCULAR | Status: DC
  Administered 2016-01-16 – 2016-01-20 (×5): 1000 [IU]/h via INTRAVENOUS
  Filled 2016-01-15 (×6): qty 250

## 2016-01-15 NOTE — Progress Notes (Signed)
ANTICOAGULATION CONSULT NOTE - Follow Up Consult  Pharmacy Consult for Heparin Indication: atrial fibrillation  No Known Allergies  Patient Measurements: Height: 6' (182.9 cm) Weight: 231 lb 11.3 oz (105.1 kg) IBW/kg (Calculated) : 77.6 Heparin Dosing Weight: 102.7kg  Vital Signs: Temp: 100.2 F (37.9 C) (07/16 1100) Temp Source: Core (Comment) (07/16 0800) BP: 120/68 mmHg (07/16 1100) Pulse Rate: 92 (07/16 1100)  Labs:  Recent Labs  01/13/16 0340 01/14/16 0350 01/14/16 1725 01/15/16 0343 01/15/16 1058  HGB 12.7* 12.5*  --  12.1*  --   HCT 38.3* 39.4  --  38.2*  --   PLT 194 213  --  214  --   HEPARINUNFRC 0.38 0.53  --  0.84* 0.83*  CREATININE 1.63* 1.66* 1.74* 1.66*  --     Estimated Creatinine Clearance: 57.1 mL/min (by C-G formula based on Cr of 1.66).   Medications:  Heparin @ 1200 units/hr  Assessment: 65yom on xarelto pta for afib now s/p cardiac arrest and transitioned to IV heparin. Heparin level remains SUPRAtherapeutic with minimal change despite verified rate decrease this am. CBC remains stable. No bleeding issues noted.  Goal of Therapy:  Heparin level 0.3-0.7 units/ml Monitor platelets by anticoagulation protocol: Yes   Plan:  1) Decrease heparin to 1000 units/hr 2) HL in 6 hours 3) Daily heparin level and CBC   Jermale Crass K. Bonnye Fava, PharmD, BCPS, CPP Clinical Pharmacist Pager: [phone] Phone: [phone] 01/15/2016 11:51 AM

## 2016-01-15 NOTE — Progress Notes (Signed)
PULMONARY / CRITICAL CARE MEDICINE   Name: William Pacheco MRN: [id number] DOB: 05-30-1951    ADMISSION DATE:  01/26/2016 CONSULTATION DATE:  01/05/2016  REFERRING MD:  Dr. Elesa Massed  CHIEF COMPLAINT:  Found down, cardiac arrest  BRIEF: 65 y/o male with CAD, CHF had a cardiac arrest (PEA/Vfib) on 7/10 and received 1 hour, 29 minutes of CPR.     SUBJECTIVE:  No major events overnight  VITAL SIGNS: BP 171/81 mmHg  Pulse 90  Temp(Src) 99.7 F (37.6 C) (Axillary)  Resp 26  Ht 6' (1.829 m)  Wt 231 lb 11.3 oz (105.1 kg)  BMI 31.42 kg/m2  SpO2 99%  HEMODYNAMICS:    VENTILATOR SETTINGS: Vent Mode:  [-] PSV FiO2 (%):  [40 %] 40 % Set Rate:  [14 bmp] 14 bmp PEEP:  [5 cmH20] 5 cmH20 Pressure Support:  [10 cmH20] 10 cmH20 Plateau Pressure:  [17 cmH20-23 cmH20] 19 cmH20  INTAKE / OUTPUT: I/O last 3 completed shifts: In: 4492.3 [I.V.:1367.3; NG/GT:2925; IV Piggyback:200] Out: 5600 [Urine:5600]  PHYSICAL EXAMINATION: General:  No distress Neuro: Not responsive to stimulation. No change in neuro exam HENT: ETT in place PULM: Clear, anteriorly CV: RRR, no MRG GI: BS+, soft, nontender, non distended MSK: normal tone and bulk Skin: No rash  LABS:  BMET  Recent Labs Lab 01/14/16 0350 01/14/16 1725 01/15/16 0343  NA 159* 158* 161*  K 3.7 3.7 3.8  CL 126* 128* 129*  CO2 26 26 26   BUN 69* 76* 77*  CREATININE 1.66* 1.74* 1.66*  GLUCOSE 221* 301* 257*    Electrolytes  Recent Labs Lab 01/11/16 1826 01/12/16 0435 01/13/16 0340 01/14/16 0350 01/14/16 1725 01/15/16 0343  CALCIUM  --  8.3* 8.8* 9.2 8.9 9.1  MG 2.7* 2.7* 3.0*  --   --   --   PHOS 1.6* 3.2 2.7  --   --   --     CBC  Recent Labs Lab 01/13/16 0340 01/14/16 0350 01/15/16 0343  WBC 13.1* 10.3 10.2  HGB 12.7* 12.5* 12.1*  HCT 38.3* 39.4 38.2*  PLT 194 213 214    Coag's  Recent Labs Lab 01/05/2016 0344 01/11/16 0800 01/11/16 1816 01/12/16 0435  APTT 33 171* 110*  --   INR 2.17*  --    --  1.23    Sepsis Markers  Recent Labs Lab 01/05/2016 0413 01/10/16 0209 01/11/16 0437 01/12/16 0435  LATICACIDVEN 7.02*  --   --   --   PROCALCITON  --  20.82 10.37 5.18    ABG  Recent Labs Lab 01/26/2016 0506 01/26/2016 0611 01/12/16 0400 01/13/16 0550  PHART 7.236* 7.343* 7.471*  --   PCO2ART 53.0* 36.2 31.4* 31.6*  PO2ART 37.0* 146.0* 62.3* 74.7*    Liver Enzymes  Recent Labs Lab 01/02/2016 0349 01/12/16 0435  AST 472* 47*  ALT 652* 146*  ALKPHOS 62 55  BILITOT 1.1 1.3*  ALBUMIN 3.7 2.6*    Cardiac Enzymes  Recent Labs Lab 01/17/2016 0349  TROPONINI 0.26*    Glucose  Recent Labs Lab 01/14/16 0805 01/14/16 1128 01/14/16 1629 01/14/16 2053 01/14/16 2330 01/15/16 0336  GLUCAP 242* 270* 267* 230* 279* 238*    Imaging No results found.  STUDIES:  CT head 7/10 >> no intracranial acute abnormality; paranasal sinus disease EEG 7/10 > diffuse slowing MRI 7/12 >> Probable hypoxic ischemic injury affecting the thalami, caudate nuclei, medial aspects of the occipital and parietal brain. Questionable hippocampal involvement. No swelling or hemorrhage at this time. MRI  7/14 > Unchanged. Still shows anoxic injury  CULTURES: 7/11 blood > NGTD 7/11 resp > GPC in pair>>> 7/14 blood >   ANTIBIOTICS: 7/11 vanc > 7/11 7/11 cefepime>   SIGNIFICANT EVENTS:  LINES/TUBES: 7/10 ETT >> 7/10 R Fem CVL >>   DISCUSSION: 65 y/o male with an extensive cardiology history including CAD, Cardiomyopathy and atrial fibrillation admitted after a cardiac arrest which was PEA and at times V-fib requiring 6 electric shocks.  His total time of CPR noted to be well over one hour, so likelihood of a meaningful neurologic outcome is near zero.  As of 7/12 he has an abnormal neurologic exam without evidence of purposeful neurologic movement. Neuro exam continues to be poor MRI showing diffuse ischemic injury. Brainstem reflexes remain intact.   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 PVS P:   RASS goal: 0 Fentanyl as needed for vent synchrony F/U Neurology recs  PULMONARY A: Acute respiratory failure with hypoxemia Acute pulmonary edema Aspiration pneumonia P:   PSV as tolerated VAP prevention bundle Continue Cefepime for pneumonia  CARDIOVASCULAR A:  Cardiac arrest> Vfib Ischemic cardiomyopathy Atrial fibrillation: Remains in A fib, rate controlled in 100-110's CAD Troponin elevated Cardiogenic shock > resolved Atrial Fibrillation  P:  Tele monitoring Continue to follow cardiology recommendations Continue Amiodarone, Coreg  RENAL A:   AKI due to cardiac arrest > improving Not a candidate for HD given severe neurologic injury Hypernatremia P:   Monitor BMET and UOP Replace electrolytes as needed Increase free water Start D5W  GASTROINTESTINAL A:   No acute issues P:   Pepcid for stress ulcer prophylaxis Tolerating TF Continue senna for bowel regimen  HEMATOLOGIC A:   Heparin Gtt P:  Heparin gtt started for A. Fib Pharmacy following and adjusting per protocol SCD for DVT prophylaxis  INFECTIOUS A:   Aspiration pneumonia vs HCAP Ongoing fever 7/14 P:   Continue cefepime Follow fever curve and WBC  ENDOCRINE A:   DM   P:   SSI Increase lantus to 15 units bid  FAMILY  - Updates: Noted ongoing discussions about goals of care. Prognosis for neuro recovery are dismal. He would not be a good candidate for a trach/peg. We will need to readress this with family if no change tomorrow.  No family at bedside 7/15 and 7/16  - Inter-disciplinary family meet or Palliative Care meeting due by: 01/16/16  Critical care time- 35 mins.  Chilton Greathouse MD [facility] Pager [phone] If no answer or after 3pm call: [phone] 01/15/2016, 8:22 AM

## 2016-01-15 NOTE — Progress Notes (Signed)
Patient ID: William Pacheco, male   DOB: [birth year], 65 y.o.   MRN: [id number]   SUBJECTIVE: Febrile overnight to 100.7.  He remains on vent, no sedation, not responsive. Excellent UOP with I/Os even.  Sodium to 161. He is in NSR.   MRI (7/14) with evidence for hypoxic ischemic injury.  Scheduled Meds: . antiseptic oral rinse  7 mL Mouth Rinse 10 times per day  . carvedilol  12.5 mg Oral BID WC  . ceFEPime (MAXIPIME) IV  1 g Intravenous Q8H  . chlorhexidine gluconate (SAGE KIT)  15 mL Mouth Rinse BID  . famotidine  20 mg Per Tube BID  . feeding supplement (PRO-STAT SUGAR FREE 64)  60 mL Per Tube TID  . feeding supplement (VITAL HIGH PROTEIN)  1,000 mL Per Tube Q24H  . free water  300 mL Per Tube Q6H  . insulin aspart  0-20 Units Subcutaneous Q4H  . insulin glargine  25 Units Subcutaneous Daily  . isosorbide-hydrALAZINE  2 tablet Oral TID  . sennosides  10 mL Per Tube QHS  . sodium chloride flush  10-40 mL Intracatheter Q12H   Continuous Infusions: . sodium chloride 10 mL/hr at 01/14/16 1615  . amiodarone 30 mg/hr (01/15/16 0025)  . heparin 1,200 Units/hr (01/15/16 0508)  . norepinephrine (LEVOPHED) Adult infusion Stopped (01/10/16 0444)   PRN Meds:.sodium chloride, acetaminophen, albuterol, fentaNYL (SUBLIMAZE) injection, hydrALAZINE, metoprolol, sodium chloride flush    Filed Vitals:   01/15/16 0400 01/15/16 0500 01/15/16 0600 01/15/16 0700  BP: 166/81 155/81 151/77 168/78  Pulse: 91 101 89 90  Temp:    99.7 F (37.6 C)  TempSrc:      Resp: _0 Height:      Weight: 231 lb 11.3 oz (105.1 kg)     SpO2: 97% 96% 97% 93%    Intake/Output Summary (Last 24 hours) at 01/15/16 0750 Last data filed at 01/15/16 0700  Gross per 24 hour  Intake 3450.93 ml  Output   3350 ml  Net 100.93 ml    LABS: Basic Metabolic Panel:  Recent Labs  01/13/16 0340  01/14/16 1725 01/15/16 0343  NA 150*  < > 158* 161*  K 3.5  < > 3.7 3.8  CL 114*  < > 128* 129*  CO2 25  < > 26  26  GLUCOSE 212*  < > 301* 257*  BUN 64*  < > 76* 77*  CREATININE 1.63*  < > 1.74* 1.66*  CALCIUM 8.8*  < > 8.9 9.1  MG 3.0*  --   --   --   PHOS 2.7  --   --   --   < > = values in this interval not displayed. Liver Function Tests: No results for input(s): AST, ALT, ALKPHOS, BILITOT, PROT, ALBUMIN in the last 72 hours. No results for input(s): LIPASE, AMYLASE in the last 72 hours. CBC:  Recent Labs  01/14/16 0350 01/15/16 0343  WBC 10.3 10.2  HGB 12.5* 12.1*  HCT 39.4 38.2*  MCV 90.6 92.0  PLT 213 214   Cardiac Enzymes: No results for input(s): CKTOTAL, CKMB, CKMBINDEX, TROPONINI in the last 72 hours. BNP: Invalid input(s): POCBNP D-Dimer: No results for input(s): DDIMER in the last 72 hours. Hemoglobin A1C: No results for input(s): HGBA1C in the last 72 hours. Fasting Lipid Panel: No results for input(s): CHOL, HDL, LDLCALC, TRIG, CHOLHDL, LDLDIRECT in the last 72 hours. Thyroid Function Tests: No results for input(s): TSH, T4TOTAL, T3FREE, THYROIDAB in the  last 72 hours.  Invalid input(s): FREET3 Anemia Panel: No results for input(s): VITAMINB12, FOLATE, FERRITIN, TIBC, IRON, RETICCTPCT in the last 72 hours.  RADIOLOGY: Ct Head Wo Contrast  01/20/2016  CLINICAL DATA:  Two pain following fall EXAM: CT HEAD WITHOUT CONTRAST TECHNIQUE: Contiguous axial images were obtained from the base of the skull through the vertex without intravenous contrast. COMPARISON:  None. FINDINGS: Brain: The ventricles are normal in size and configuration. There is no intracranial mass, hemorrhage, extra-axial fluid collection, or midline shift. Gray-white compartments appear normal. No acute infarct is evident. Vascular: There is calcification in the cavernous carotid artery regions bilaterally. Skull: There is a left parietal occipital region scalp hematoma. The bony calvarium appears intact. Sinuses/Orbits: Orbits appear symmetric bilaterally. There is opacification of multiple ethmoid air  cells bilaterally. There is mucosal thickening in the superior left maxillary antrum as well as in the anterior right sphenoid sinus region. A smaller fluid levels noted right inferior frontal sinus region. There is diffuse nasal turbinate edema with obstruction of both nares. Other: Mastoid air cells are clear. IMPRESSION: No intracranial mass, hemorrhage, or extra-axial fluid collection. No focal infarct evident. Right parieto-occipital scalp hematoma.  No fracture. Multifocal paranasal sinus disease. Obstruction of both nares with apparent diffuse nares edema. Electronically Signed   By: Lowella Grip III M.D.   On: 01/10/2016 08:29   Mr Brain Wo Contrast  01/11/2016  CLINICAL DATA:  Admitted 01/07/2016 with cardiac arrest. Encephalopathy. Ventilator support. EXAM: MRI HEAD WITHOUT CONTRAST TECHNIQUE: Multiplanar, multiecho pulse sequences of the brain and surrounding structures were obtained without intravenous contrast. COMPARISON:  CT 01/03/2016 FINDINGS: Diffusion imaging suffers from artifact 3 shows restricted diffusion affecting the occipital cortical and subcortical brain bilaterally, the thalami, the caudate nuclei, an the posterior parietal region bilaterally. Questionable hippocampal abnormality as well. The brain does not show generalized swelling. No evidence of mass lesion, hemorrhage, hydrocephalus or extra-axial collection. No pituitary mass. No inflammatory sinus disease. No skull or skullbase lesion. IMPRESSION: Probable hypoxic ischemic injury affecting the thalami, caudate nuclei, medial aspects of the occipital and parietal brain. Questionable hippocampal involvement. No swelling or hemorrhage at this time. Electronically Signed   By: Nelson Chimes M.D.   On: 01/11/2016 19:26   Mr Jeri Cos IO Contrast  01/13/2016  CLINICAL DATA:  Possible hypoxic injury.  Patient on ventilator. EXAM: MRI HEAD WITHOUT AND WITH CONTRAST TECHNIQUE: Multiplanar, multiecho pulse sequences of the brain and  surrounding structures were obtained without and with intravenous contrast. CONTRAST:  20 mL IV MultiHance COMPARISON:  Brain MRI 01/11/2016 FINDINGS: Brain Parenchyma: There is diffusion restriction within the bilateral occipital cortices, the bilateral thalami and caudate nuclei, unchanged. Possible diffusion restriction within the right greater than left hippocampi is unchanged. No evidence of acute hemorrhage. No abnormal parenchymal contrast enhancement. No mass lesion or midline shift. The major intracranial flow voids are preserved. The midline structures are normal. Ventricles, Sulci and Extra-axial Spaces: Normal for age. No extra-axial collection. Paranasal Sinuses and Mastoids: No fluid levels or advanced mucosal thickening. Orbits: Normal. Bones and Soft Tissues: The visualized skull base, calvarium and extracranial soft tissues are normal. IMPRESSION: Unchanged distribution of diffusion restriction involving the bilateral occipital lobe cortices, bilateral thalami and caudate nuclei with possible restricted diffusion in the right greater than left hippocampi. The pattern is again suggestive of hypoxic ischemic injury, particularly in the given clinical context. Electronically Signed   By: Ulyses Jarred M.D.   On: 01/13/2016 17:33   Dg Chest [facility]  1 View  01/13/2016  CLINICAL DATA:  Acute respiratory failure, shortness of breath, CHF, coronary artery disease, peripheral vascular disease EXAM: PORTABLE CHEST 1 VIEW COMPARISON:  Portable chest x-ray of January 12, 2016 FINDINGS: The lungs are well-expanded. Confluent interstitial and alveolar opacities on the right have improved markedly. There is persistent increased density in the left lower lobe medially. The cardiac silhouette remains enlarged. The pulmonary vascularity is not clearly engorged. The endotracheal tube tip lies 6 cm above the clavicular heads. The esophagogastric tube tip projects below the inferior margin of the image. IMPRESSION: Stable  cardiomegaly. Interval improvement in interstitial edema or pneumonia on the right. Persistent left lower lobe atelectasis or pneumonia. The support tubes are in stable position. Electronically Signed   By: David  Martinique M.D.   On: 01/13/2016 07:32   Dg Chest Port 1 View  01/12/2016  CLINICAL DATA:  On shortness of breath with ventilator dependence. EXAM: PORTABLE CHEST 1 VIEW COMPARISON:  01/11/2016.  01/24/2016. FINDINGS: 0501 hours. Endotracheal tube 5.1 cm above the base of the carina. The NG tube passes into the stomach although the distal tip position is not included on the film. Leftward patient rotation. Cardiopericardial silhouette is at upper limits of normal for size. The relatively diffuse right-sided airspace disease with slight central predominance persists without substantial change. Left lung remains relatively clear. Patient is status post CABG. Telemetry leads overlie the chest. IMPRESSION: Persistent asymmetric right-sided relatively diffuse airspace disease. Asymmetric pulmonary edema versus diffuse right lung infection. Electronically Signed   By: Misty Stanley M.D.   On: 01/12/2016 07:30   Dg Chest Port 1 View  01/11/2016  CLINICAL DATA:  Acute respiratory failure, hypoxia ; coronary artery disease with previous CABG. EXAM: PORTABLE CHEST 1 VIEW COMPARISON:  Portable chest x-ray of January 09, 2016 FINDINGS: The left lung is well expanded and clear. On the right confluent alveolar opacities have worsened in the perihilar region especially in the mid and lower lung. There is no pleural effusion or pneumothorax. The heart is top-normal in size. The pulmonary vascularity is not engorged. The endotracheal tube tip lies 6.1 cm above the carina. Esophagogastric tube tip projects below the inferior margin of the image. The mediastinum is normal in width. Sternal wires appear intact. IMPRESSION: Interval worsening of alveolar opacities on the right most compatible with pneumonia though asymmetric  pulmonary edema could produce similar findings. The support tubes are in stable position. Electronically Signed   By: David  Martinique M.D.   On: 01/11/2016 07:06   Dg Chest Portable 1 View  01/19/2016  CLINICAL DATA:  Post intubation EXAM: PORTABLE CHEST 1 VIEW COMPARISON:  09/14/2013 FINDINGS: Endotracheal tube placed with tip measuring 4.3 cm above the carina. Enteric tube tip is off the field of view but below the left hemidiaphragm, likely in the distal stomach. Shallow inspiration. Heart size and pulmonary vascularity are normal. Perihilar infiltration is suggested on the right possibly due to asymmetric edema or pneumonia. No blunting of costophrenic angles. No pneumothorax. Postoperative changes in the mediastinum. IMPRESSION: Appliances appear in satisfactory position. Right perihilar infiltration or edema. Electronically Signed   By: Lucienne Capers M.D.   On: 01/27/2016 04:15    PHYSICAL EXAM General: Intubated Neck: No JVD, no thyromegaly or thyroid nodule.  Lungs: Decreased breath sounds at bases. CV: Nondisplaced PMI.  Heart regular S1/S2, no S3/S4, no murmur.  No peripheral edema.   Abdomen: Soft, no hepatosplenomegaly, no distention.  Neurologic: Not responsive Extremities: No clubbing or cyanosis.  TELEMETRY: Reviewed telemetry pt in NSR  ASSESSMENT AND PLAN: 65 yo with history of CAD s/p CABG, PAD, and ischemic cardiomyopathy had witnessed out of hospital cardiac arrest and 1.5 hours CPR.  1. Cardiac arrest: Witnessed, 1.5 hours CPR.  PEA/vfib.  No STEMI by ECG.   2. Atrial fibrillation: Paroxysmal.  In afib earlier in hospitalization with RVR. Now in NSR on amiodarone gtt + heparin gtt.  - Continue amiodarone + heparin.  - Continue Coreg.  3. Acute systolic CHF: Ischemic cardiomyopathy.  EF down to 20-25% on echo from 40% prior.  ?Stunning related to cardiac arrest versus new event.  - If he has meaningful neurologic recovery, should have cardiac catheterization with ICD if  no coronary revascularization.  - Increase Bidil to 2 tab tid with elevated BP. - Continue Coreg.  - He is auto-diuresing with no Lasix, I/Os even.  Does not appear volume overloaded.  4. CAD: S/p CABG.  No STEMI. As above, if he has recovery will need cath.  5. ID: Concern for aspiration PNA versus HCAP. Low grade fever. On cefepime. Per CCM.  6. Hypernatremia: Increase free water.  7. Neuro: Suspect anoxic injury, MRI consistent with this. No meaningful neurologic recovery at this point.  Prognosis is increasingly poor.  Neurology following.  8. AKI: Creatinine stable.   Loralie Champagne 01/15/2016 7:50 AM

## 2016-01-15 NOTE — Progress Notes (Signed)
ANTICOAGULATION CONSULT NOTE - Follow Up Consult  Pharmacy Consult for heparin Indication: atrial fibrillation  Labs:  Recent Labs  01/13/16 0340 01/14/16 0350 01/14/16 1725 01/15/16 0343  HGB 12.7* 12.5*  --  12.1*  HCT 38.3* 39.4  --  38.2*  PLT 194 213  --  214  HEPARINUNFRC 0.38 0.53  --  0.84*  CREATININE 1.63* 1.66* 1.74* 1.66*    Assessment: 65yo male now above goal on heparin after several levels at goal though had started trending up yesterday.  Goal of Therapy:  Heparin level 0.3-0.7 units/ml   Plan:  Will decrease heparin gtt slightly to 1200 units/hr and check level in 6hr.  Vernard Gambles, PharmD, BCPS  01/15/2016,4:42 AM

## 2016-01-15 NOTE — Progress Notes (Signed)
Subjective:  William Pacheco is resting in bed, intubated, no commands  Exam: Filed Vitals:   01/15/16 0600 01/15/16 0700  BP: 151/77 168/78  Pulse: 89 90  Temp:  99.7 F (37.6 C)  Resp: 22 26    HEENT-  Normocephalic, no lesions, without obvious abnormality.  Normal external eye and conjunctiva.  Normal TM's bilaterally.  Normal auditory canals and external ears. Normal external nose, mucus membranes and septum.  Normal pharynx. Cardiovascular- regular rate and rhythm, S1, S2 normal, no murmur, click, rub or gallop, pulses palpable throughout   Lungs- bilateral basilar rales heard, Heart exam - S1, S2 normal, no murmur, no gallop, rate regular Abdomen- soft, non-tender; bowel sounds normal; no masses,  no organomegaly Extremities- less then 2 second capillary refill   Gen: In bed, NAD MS: does not respond to verbal requests. JT:TSVXB eyes, pupils equal and reactive, no facial asymmetry Motor: few brief spontaneous mild movements of RUE, no other movements Sensory: pupillary reflex to dps both UE's   Pertinent Labs/Diagnostics:  Anoxic brain injury    Impression:   William Pacheco has some mild improvement.  He is spontaneously opening his eyes, pupils are reactive, but does not respond to verbal stimuli.  There are a few brief movements of the RUE, but minimal.  He has a few rales and coughs occasionally.  Now day 6 s/p anoxic injury.  Nursing staff notes increased secretions today.  High risk of respiratory infections.  There are a few minimal changes.  However, the prognosis remains extremely guarded.   Recommendations: 1) Will follow.   Ezinne Yogi A. Hilda Blades, M.D. Neurohospitalist Phone: [phone]   01/15/2016, 7:39 AM

## 2016-01-15 NOTE — Progress Notes (Signed)
CRITICAL VALUE ALERT  Critical value received:  Sodium 161  Date of notification:  01/15/2016  Time of notification:  0430  Critical value read back:Yes.    Nurse who received alert:  Martie Lee, RN  ELink notified at [phone]

## 2016-01-15 NOTE — Progress Notes (Signed)
ANTICOAGULATION CONSULT NOTE - Follow Up Consult  Pharmacy Consult for Heparin Indication: atrial fibrillation  No Known Allergies  Patient Measurements: Height: 6' (182.9 cm) Weight: 231 lb 11.3 oz (105.1 kg) IBW/kg (Calculated) : 77.6 Heparin Dosing Weight: 102.7kg  Vital Signs: Temp: 99.7 F (37.6 C) (07/16 1600) Temp Source: Core (Comment) (07/16 1600) BP: 146/76 mmHg (07/16 1600) Pulse Rate: 87 (07/16 1600)  Labs:  Recent Labs  01/13/16 0340 01/14/16 0350 01/14/16 1725 01/15/16 0343 01/15/16 1058 01/15/16 1720  HGB 12.7* 12.5*  --  12.1*  --   --   HCT 38.3* 39.4  --  38.2*  --   --   PLT 194 213  --  214  --   --   HEPARINUNFRC 0.38 0.53  --  0.84* 0.83* 0.66  CREATININE 1.63* 1.66* 1.74* 1.66*  --   --     Estimated Creatinine Clearance: 57.1 mL/min (by C-G formula based on Cr of 1.66).   Medications:  Heparin @ 1000 units/hr  Assessment: 65yom on xarelto pta for afib now s/p cardiac arrest and transitioned to IV heparin. Heparin level is now therapeutic after multiple dose adjustments. No bleeding issues noted.  Goal of Therapy:  Heparin level 0.3-0.7 units/ml Monitor platelets by anticoagulation protocol: Yes   Plan:  - Continue heparin gtt 1000 units/hr - F/u AM heparin level to confirm dosing - Daily HL and CBC  Lysle Pearl, PharmD, BCPS Pager # [phone] 01/15/2016 6:00 PM

## 2016-01-16 ENCOUNTER — Inpatient Hospital Stay (HOSPITAL_COMMUNITY): Payer: 59

## 2016-01-16 LAB — BLOOD GAS, ARTERIAL
Acid-Base Excess: 2.2 mmol/L — ABNORMAL HIGH (ref 0.0–2.0)
Bicarbonate: 25.2 mEq/L — ABNORMAL HIGH (ref 20.0–24.0)
DRAWN BY: 460981
FIO2: 0.4
O2 Saturation: 95.2 %
PEEP: 5 cmH2O
PO2 ART: 74.7 mmHg — AB (ref 80.0–100.0)
Patient temperature: 98.6
Pressure control: 22 cmH2O
RATE: 14 resp/min
TCO2: 26.1 mmol/L (ref 0–100)
pCO2 arterial: 31.6 mmHg — ABNORMAL LOW (ref 35.0–45.0)
pH, Arterial: 7.512 — ABNORMAL HIGH (ref 7.350–7.450)

## 2016-01-16 LAB — BASIC METABOLIC PANEL
Anion gap: 5 (ref 5–15)
BUN: 76 mg/dL — AB (ref 6–20)
CALCIUM: 8.9 mg/dL (ref 8.9–10.3)
CHLORIDE: 128 mmol/L — AB (ref 101–111)
CO2: 24 mmol/L (ref 22–32)
CREATININE: 1.54 mg/dL — AB (ref 0.61–1.24)
GFR calc Af Amer: 54 mL/min — ABNORMAL LOW (ref >60)
GFR calc non Af Amer: 46 mL/min — ABNORMAL LOW (ref >60)
Glucose, Bld: 270 mg/dL — ABNORMAL HIGH (ref 65–99)
Potassium: 3.6 mmol/L (ref 3.5–5.1)
SODIUM: 157 mmol/L — AB (ref 135–145)

## 2016-01-16 LAB — CBC
HCT: 35.5 % — ABNORMAL LOW (ref 39.0–52.0)
HEMOGLOBIN: 10.9 g/dL — AB (ref 13.0–17.0)
MCH: 28.6 pg (ref 26.0–34.0)
MCHC: 30.7 g/dL (ref 30.0–36.0)
MCV: 93.2 fL (ref 78.0–100.0)
Platelets: 249 10*3/uL (ref 150–400)
RBC: 3.81 MIL/uL — ABNORMAL LOW (ref 4.22–5.81)
RDW: 16.8 % — AB (ref 11.5–15.5)
WBC: 10.2 10*3/uL (ref 4.0–10.5)

## 2016-01-16 LAB — GLUCOSE, CAPILLARY
GLUCOSE-CAPILLARY: 194 mg/dL — AB (ref 65–99)
GLUCOSE-CAPILLARY: 265 mg/dL — AB (ref 65–99)
GLUCOSE-CAPILLARY: 282 mg/dL — AB (ref 65–99)
Glucose-Capillary: 251 mg/dL — ABNORMAL HIGH (ref 65–99)
Glucose-Capillary: 252 mg/dL — ABNORMAL HIGH (ref 65–99)
Glucose-Capillary: 229 mg/dL — ABNORMAL HIGH (ref 65–99)
Glucose-Capillary: 275 mg/dL — ABNORMAL HIGH (ref 65–99)

## 2016-01-16 LAB — HEPARIN LEVEL (UNFRACTIONATED): Heparin Unfractionated: 0.59 IU/mL (ref 0.30–0.70)

## 2016-01-16 MED ORDER — GADOBENATE DIMEGLUMINE 529 MG/ML IV SOLN
20.0000 mL | Freq: Once | INTRAVENOUS | Status: AC
  Administered 2016-01-13: 20 mL via INTRAVENOUS

## 2016-01-16 MED ORDER — SODIUM CHLORIDE 0.9 % IV SOLN
1000.0000 mg | Freq: Two times a day (BID) | INTRAVENOUS | Status: DC
  Administered 2016-01-16 – 2016-01-21 (×11): 1000 mg via INTRAVENOUS
  Filled 2016-01-16 (×13): qty 10

## 2016-01-16 MED ORDER — POTASSIUM CHLORIDE CRYS ER 20 MEQ PO TBCR
30.0000 meq | EXTENDED_RELEASE_TABLET | Freq: Once | ORAL | Status: AC
  Administered 2016-01-16: 30 meq via ORAL
  Filled 2016-01-16: qty 1

## 2016-01-16 NOTE — Procedures (Signed)
History: 65 yo M s/p anoxic brain injury  Sedation: None  Technique: This is a 21 channel routine scalp EEG performed at the bedside with bipolar and monopolar montages arranged in accordance to the international 10/20 system of electrode placement. One channel was dedicated to EKG recording.    Background: The background consists of generalized irregular delta and theta activities. There is no clear posterior dominant rhythm seen. There are occasional generalized periodic sharp, or sharp and wave discharges. These are isolated, with no seizure seen.  Photic stimulation: Physiologic driving is not performed  EEG Abnormalities: 1) generalized isolated epileptiform discharges 2) generalized irregular slow activity 3) absent PDR  Clinical Interpretation: This EEG is consistent with a generalized cortical irritability that could be a source of seizures. There is also evidence of a generalized nonspecific cerebral dysfunction (encephalopathy). No seizure was recorded on this EEG.  Ritta Slot, MD [facility] [phone]  If 7pm- 7am, please page neurology on call as listed in AMION.

## 2016-01-16 NOTE — Progress Notes (Signed)
EEG completed, results pending. 

## 2016-01-16 NOTE — Progress Notes (Signed)
ANTICOAGULATION CONSULT NOTE - Follow Up Consult ANTIBIOTIC CONSULT NOTE - Follow Up Consult  Pharmacy Consult for Heparin and Cefepime Indication: atrial fibrillation and pneumonia  No Known Allergies  Patient Measurements: Height: 6' (182.9 cm) Weight: 230 lb 9.6 oz (104.6 kg) IBW/kg (Calculated) : 77.6 Heparin Dosing Weight: 102.7kg  Vital Signs: Temp: 98 F (36.7 C) (07/17 0837) Temp Source: Oral (07/17 0837) BP: 151/75 mmHg (07/17 0837) Pulse Rate: 83 (07/17 0837)  Labs:  Recent Labs  01/14/16 0350 01/14/16 1725 01/15/16 0343 01/15/16 1058 01/15/16 1720 01/16/16 0330  HGB 12.5*  --  12.1*  --   --  10.9*  HCT 39.4  --  38.2*  --   --  35.5*  PLT 213  --  214  --   --  249  HEPARINUNFRC 0.53  --  0.84* 0.83* 0.66 0.59  CREATININE 1.66* 1.74* 1.66*  --   --  1.54*    Estimated Creatinine Clearance: 61.4 mL/min (by C-G formula based on Cr of 1.54).   Medications:  Heparin @ 1000 units/hr  Assessment: 65yom on xarelto pta for afib now s/p cardiac arrest was transitioned to IV heparin. Heparin level remains therapeutic, hgb down from 12.1 to 10.9. No bleeding noted. Also on day #7 of cefepime for aspiration pneumonia. Renal function improved and dose adjusted 7/14, SCr continues to improve and dose still appropriate. WBC stable wnl, AF.  Cefepime 7/11>> Vancomycin 7/11  7/14 BC*2: ngtd 7/13 TA: abundant GNR 7/11 BCx2: ngtd 7/11 TA: few GPC in pairs >> normal flora 7/10 MRSA PCR neg  Goal of Therapy:  Heparin level 0.3-0.7 units/ml Monitor platelets by anticoagulation protocol: Yes   Plan:  1) Continue heparin at 1000 units/hr 2) Daily heparin level and CBC 3) Cefepime 1g IV q8h  Sherron Monday, PharmD Clinical Pharmacy Resident Pager: [phone] 01/16/2016 9:18 AM

## 2016-01-16 NOTE — Progress Notes (Signed)
eLink Physician-Brief Progress Note Patient Name: William Pacheco DOB: 1950/09/04 MRN: [id number]   Date of Service  01/16/2016  HPI/Events of Note  hypokalemia  eICU Interventions  repleted     Intervention Category Minor Interventions: Electrolytes abnormality - evaluation and management  Max Fickle 01/16/2016, 4:52 AM

## 2016-01-16 NOTE — Progress Notes (Signed)
Subjective: Exam appears similar to yesterday.   Exam: Filed Vitals:   01/16/16 1008 01/16/16 1202  BP: 159/75   Pulse: 86   Temp:  98.4 F (36.9 C)  Resp:     Gen: In bed, NAD Resp: non-labored breathing, no acute distress Abd: soft, nt  With sitmulation, he has some multi- focal myoclonus.   Neuro: MS: opens eyes to noxious sitmuli [id number], corneals intact Motor: Minimal flexion in the right, unclear if the reflexes on the left versus myoclonus stimulation Sensory: As above  Impression: 65 year old male now day 7 status post anoxic brain injury, given that he had a hypothermia, he may be reasonable to wait 1 week from rewarming but my suspicion is that he has a poor prognosis at this point. The myoclonus does not generalized, and seems to be clearly stimulus provoked.  Recommendations: 1) repeat EEG 2) Keppra for myoclonus 3) we will follow  Ritta Slot, MD [facility] [phone]  If 7pm- 7am, please page neurology on call as listed in AMION.

## 2016-01-16 NOTE — Progress Notes (Signed)
Inpatient Diabetes Program Recommendations  AACE/ADA: New Consensus Statement on Inpatient Glycemic Control (2015)  Target Ranges:  Prepandial:   less than 140 mg/dL      Peak postprandial:   less than 180 mg/dL (1-2 hours)      Critically ill patients:  140 - 180 mg/dL  Results for William Pacheco, William Pacheco (MRN [id number]) as of 01/16/2016 09:14  Ref. Range 01/15/2016 07:38 01/15/2016 11:52 01/15/2016 15:50 01/15/2016 20:36 01/16/2016 00:23 01/16/2016 03:12  Glucose-Capillary Latest Ref Range: 65-99 mg/dL 496 (H) 759 (H) 163 (H) 289 (H) 265 (H) 251 (H)    Review of Glycemic Control  Diabetes history: DM2 Outpatient Diabetes medications: Humulin R U500 (concentrated insulin) 35 units with breakfast, 45 units with lunch, 35 units with supper, Invokan 300 mg QAM, Metformin XR 1500 mg daily Current orders for Inpatient glycemic control: Lantus 15 units BID, Novolog 0-20 units Q4H  Inpatient Diabetes Program Recommendations: Insulin - Basal: Please consider increasing Lantus to 20 units BID. Correction (SSI): Please consider changing to ICU Glycemic Control order set to improve inpatient glycemic control. Insulin - Meal Coverage: Please consider ordering Novolog 5 units Q4H for tube feeding coverage. If tube feeding is held or discontinued then Novolog tube feeding coverage would also be held or discontinued.  Thanks, Orlando Penner, RN, MSN, CDE Diabetes Coordinator Inpatient Diabetes Program [phone] (Team Pager from 8am to 5pm) [phone] ([facility] office) [phone] [facility] office) [phone] [facility] office)

## 2016-01-16 NOTE — Progress Notes (Signed)
PULMONARY / CRITICAL CARE MEDICINE   Name: William Pacheco MRN: [id number] DOB: 1950-10-08    ADMISSION DATE:  2016/01/24 CONSULTATION DATE:  01-24-2016  REFERRING MD:  Dr. Elesa Massed  CHIEF COMPLAINT:  Found down, cardiac arrest  BRIEF: 65 y/o male with CAD, CHF had a cardiac arrest (PEA/Vfib) on 7/10 and received 1 hour, 29 minutes of CPR.     SUBJECTIVE:  No major events overnight, remains completely unresponsive.  VITAL SIGNS: BP 159/75 mmHg  Pulse 86  Temp(Src) 98.4 F (36.9 C) (Oral)  Resp 22  Ht 6' (1.829 m)  Wt 104.6 kg (230 lb 9.6 oz)  BMI 31.27 kg/m2  SpO2 99%  HEMODYNAMICS:    VENTILATOR SETTINGS: Vent Mode:  [-] CPAP;PSV FiO2 (%):  [40 %] 40 % Set Rate:  [14 bmp] 14 bmp PEEP:  [5 cmH20] 5 cmH20 Pressure Support:  [8 cmH20-10 cmH20] 8 cmH20 Plateau Pressure:  [18 cmH20-20 cmH20] 20 cmH20  INTAKE / OUTPUT: I/O last 3 completed shifts: In: 3426.4 [I.V.:1291.4; NG/GT:1935; IV Piggyback:200] Out: 4770 [Urine:4770]  PHYSICAL EXAMINATION: General:  No distress, unresponsive Neuro: Not responsive to stimulation. No change in neuro exam, does not withdraw to pain. HENT: ETT in place PULM: Clear, anteriorly CV: RRR, no MRG GI: BS+, soft, nontender, non distended MSK: normal tone and bulk Skin: No rash  LABS:  BMET  Recent Labs Lab 01/14/16 1725 01/15/16 0343 01/16/16 0330  NA 158* 161* 157*  K 3.7 3.8 3.6  CL 128* 129* 128*  CO2 BUN 76* 77* 76*  CREATININE 1.74* 1.66* 1.54*  GLUCOSE 301* 257* 270*   Electrolytes  Recent Labs Lab 01/11/16 1826 01/12/16 0435 01/13/16 0340  01/14/16 1725 01/15/16 0343 01/16/16 0330  CALCIUM  --  8.3* 8.8*  < > 8.9 9.1 8.9  MG 2.7* 2.7* 3.0*  --   --   --   --   PHOS 1.6* 3.2 2.7  --   --   --   --   < > = values in this interval not displayed.  CBC  Recent Labs Lab 01/14/16 0350 01/15/16 0343 01/16/16 0330  WBC 10.3 10.2 10.2  HGB 12.5* 12.1* 10.9*  HCT 39.4 38.2* 35.5*  PLT 213 214  249   Coag's  Recent Labs Lab 01/11/16 0800 01/11/16 1816 01/12/16 0435  APTT 171* 110*  --   INR  --   --  1.23   Sepsis Markers  Recent Labs Lab 01/10/16 0209 01/11/16 0437 01/12/16 0435  PROCALCITON 20.82 10.37 5.18   ABG  Recent Labs Lab 01/12/16 0400 01/13/16 0550  PHART 7.471* 7.512*  PCO2ART 31.4* 31.6*  PO2ART 62.3* 74.7*    Liver Enzymes  Recent Labs Lab 01/12/16 0435  AST 47*  ALT 146*  ALKPHOS 55  BILITOT 1.3*  ALBUMIN 2.6*    Cardiac Enzymes No results for input(s): TROPONINI, PROBNP in the last 168 hours.  Glucose  Recent Labs Lab 01/15/16 1152 01/15/16 1550 01/15/16 2036 01/16/16 0023 01/16/16 0312 01/16/16 0853  GLUCAP 296* 260* 289* 265* 251* 194*    Imaging No results found.  STUDIES:  CT head 7/10 >> no intracranial acute abnormality; paranasal sinus disease EEG 7/10 > diffuse slowing MRI 7/12 >> Probable hypoxic ischemic injury affecting the thalami, caudate nuclei, medial aspects of the occipital and parietal brain. Questionable hippocampal involvement. No swelling or hemorrhage at this time. MRI 7/14 > Unchanged. Still shows anoxic injury  CULTURES: 7/11 blood > NGTD 7/11 resp >  GPC in pair>>> 7/14 blood >   ANTIBIOTICS: 7/11 vanc > 7/11 7/11 cefepime>   SIGNIFICANT EVENTS:  LINES/TUBES: 7/10 ETT >> 7/10 R Fem CVL >>   DISCUSSION: 65 y/o male with an extensive cardiology history including CAD, Cardiomyopathy and atrial fibrillation admitted after a cardiac arrest which was PEA and at times V-fib requiring 6 electric shocks.  His total time of CPR noted to be well over one hour, so likelihood of a meaningful neurologic outcome is near zero.  As of 7/12 he has an abnormal neurologic exam without evidence of purposeful neurologic movement. Neuro exam continues to be poor MRI showing diffuse ischemic injury. Brainstem reflexes remain intact.   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 PVS P:   RASS goal: 0 Fentanyl as needed for vent synchrony F/U Neurology recs  PULMONARY A: Acute respiratory failure with hypoxemia Acute pulmonary edema Aspiration pneumonia P:   PSV as tolerated, not a trach/peg candidate. VAP prevention bundle. Continue Cefepime for pneumonia.  CARDIOVASCULAR A:  Cardiac arrest> Vfib Ischemic cardiomyopathy Atrial fibrillation: Remains in A fib, rate controlled in 100-110's CAD Troponin elevated Cardiogenic shock > resolved Atrial Fibrillation  P:  Tele monitoring Continue to follow cardiology recommendations Continue Amiodarone, Coreg Levophed for BP support.  RENAL A:   AKI due to cardiac arrest > improving Not a candidate for HD given severe neurologic injury Hypernatremia P:   Monitor BMET and UOP Replace electrolytes as needed Increased free water 400 q4  GASTROINTESTINAL A:   No acute issues P:   Pepcid for stress ulcer prophylaxis Tolerating TF Continue senna for bowel regimen  HEMATOLOGIC A:   Heparin Gtt P:  Heparin gtt started for A. Fib Pharmacy following and adjusting per protocol SCD for DVT prophylaxis  INFECTIOUS A:   Aspiration pneumonia vs HCAP Ongoing fever 7/14 P:   Continue cefepime Follow fever curve and WBC  ENDOCRINE A:   DM   P:   SSI Increase lantus to 15 units bid  FAMILY  - Updates: No family bedside, reportedly they want to wait the full 14 days.  Will wait til next Monday then recommend withdrawal. - Inter-disciplinary family meet or Palliative Care meeting due by: 01/16/16  The patient is critically ill with multiple organ systems failure and requires high complexity decision making for assessment and support, frequent evaluation and titration of therapies, application of advanced monitoring technologies and extensive interpretation of multiple databases.   Critical Care Time devoted to patient care services  described in this note is  35  Minutes. This time reflects time of care of this signee Dr Koren Bound. This critical care time does not reflect procedure time, or teaching time or supervisory time of PA/NP/Med student/Med Resident etc but could involve care discussion time.  Alyson Reedy, M.D. [facility] Pulmonary/Critical Care Medicine. Pager: [phone]. After hours pager: [phone].  01/16/2016, 12:16 PM

## 2016-01-17 ENCOUNTER — Inpatient Hospital Stay (HOSPITAL_COMMUNITY): Payer: 59

## 2016-01-17 LAB — HEPARIN LEVEL (UNFRACTIONATED): Heparin Unfractionated: 0.43 IU/mL (ref 0.30–0.70)

## 2016-01-17 LAB — GLUCOSE, CAPILLARY
GLUCOSE-CAPILLARY: 306 mg/dL — AB (ref 65–99)
Glucose-Capillary: 275 mg/dL — ABNORMAL HIGH (ref 65–99)
Glucose-Capillary: 193 mg/dL — ABNORMAL HIGH (ref 65–99)
Glucose-Capillary: 267 mg/dL — ABNORMAL HIGH (ref 65–99)
Glucose-Capillary: 365 mg/dL — ABNORMAL HIGH (ref 65–99)

## 2016-01-17 LAB — BLOOD GAS, ARTERIAL
ACID-BASE DEFICIT: 0.1 mmol/L (ref 0.0–2.0)
BICARBONATE: 23 meq/L (ref 20.0–24.0)
Drawn by: 365271
FIO2: 0.4
LHR: 14 {breaths}/min
O2 Saturation: 96.2 %
PATIENT TEMPERATURE: 98.6
PEEP/CPAP: 5 cmH2O
PO2 ART: 83.6 mmHg (ref 80.0–100.0)
PRESSURE CONTROL: 22 cmH2O
TCO2: 23.9 mmol/L (ref 0–100)
pCO2 arterial: 31 mmHg — ABNORMAL LOW (ref 35.0–45.0)
pH, Arterial: 7.482 — ABNORMAL HIGH (ref 7.350–7.450)

## 2016-01-17 LAB — BASIC METABOLIC PANEL
ANION GAP: 9 (ref 5–15)
BUN: 69 mg/dL — ABNORMAL HIGH (ref 6–20)
CHLORIDE: 128 mmol/L — AB (ref 101–111)
CO2: 23 mmol/L (ref 22–32)
Calcium: 8.7 mg/dL — ABNORMAL LOW (ref 8.9–10.3)
Creatinine, Ser: 1.44 mg/dL — ABNORMAL HIGH (ref 0.61–1.24)
GFR calc Af Amer: 58 mL/min — ABNORMAL LOW (ref >60)
GFR, EST NON AFRICAN AMERICAN: 50 mL/min — AB (ref >60)
GLUCOSE: 291 mg/dL — AB (ref 65–99)
POTASSIUM: 3.7 mmol/L (ref 3.5–5.1)
Sodium: 160 mmol/L — ABNORMAL HIGH (ref 135–145)

## 2016-01-17 LAB — CBC
HEMATOCRIT: 33.5 % — AB (ref 39.0–52.0)
HEMOGLOBIN: 10.1 g/dL — AB (ref 13.0–17.0)
MCH: 28.2 pg (ref 26.0–34.0)
MCHC: 30.1 g/dL (ref 30.0–36.0)
MCV: 93.6 fL (ref 78.0–100.0)
PLATELETS: 275 10*3/uL (ref 150–400)
RBC: 3.58 MIL/uL — AB (ref 4.22–5.81)
RDW: 16.7 % — ABNORMAL HIGH (ref 11.5–15.5)
WBC: 11.5 10*3/uL — AB (ref 4.0–10.5)

## 2016-01-17 LAB — MAGNESIUM: MAGNESIUM: 2.8 mg/dL — AB (ref 1.7–2.4)

## 2016-01-17 LAB — PHOSPHORUS: Phosphorus: 3 mg/dL (ref 2.5–4.6)

## 2016-01-17 MED ORDER — FUROSEMIDE 10 MG/ML IJ SOLN
40.0000 mg | Freq: Four times a day (QID) | INTRAMUSCULAR | Status: AC
  Administered 2016-01-17 (×3): 40 mg via INTRAVENOUS
  Filled 2016-01-17 (×3): qty 4

## 2016-01-17 MED ORDER — DEXTROSE 5 % IV SOLN
INTRAVENOUS | Status: DC
  Administered 2016-01-17 – 2016-01-18 (×2): via INTRAVENOUS
  Administered 2016-01-19: 50 mL via INTRAVENOUS

## 2016-01-17 MED ORDER — POTASSIUM CHLORIDE 20 MEQ/15ML (10%) PO SOLN
40.0000 meq | Freq: Three times a day (TID) | ORAL | Status: AC
  Administered 2016-01-17 (×2): 40 meq
  Filled 2016-01-17 (×2): qty 30

## 2016-01-17 MED ORDER — METOLAZONE 5 MG PO TABS
5.0000 mg | ORAL_TABLET | Freq: Every day | ORAL | Status: AC
  Administered 2016-01-17: 5 mg via ORAL
  Filled 2016-01-17: qty 1

## 2016-01-17 NOTE — Progress Notes (Signed)
Inpatient Diabetes Program Recommendations  AACE/ADA: New Consensus Statement on Inpatient Glycemic Control (2015)  Target Ranges:  Prepandial:   less than 140 mg/dL      Peak postprandial:   less than 180 mg/dL (1-2 hours)      Critically ill patients:  140 - 180 mg/dL   Results for William Pacheco, William Pacheco (MRN [id number]) as of 01/17/2016 09:47  Ref. Range 01/16/2016 08:53 01/16/2016 12:12 01/16/2016 17:22 01/16/2016 20:03 01/16/2016 23:55 01/17/2016 03:42 01/17/2016 08:01  Glucose-Capillary Latest Ref Range: 65-99 mg/dL 810 (H) 175 (H) 102 (H) 275 (H) 282 (H) 275 (H) 193 (H)   Review of Glycemic Control  Diabetes history: DM2 Outpatient Diabetes medications: Humulin R U500 (concentrated insulin) 35 units with breakfast, 45 units with lunch, 35 units with supper, Invokan 300 mg QAM, Metformin XR 1500 mg daily Current orders for Inpatient glycemic control: Lantus 15 units BID, Novolog 0-20 units Q4H  Inpatient Diabetes Program Recommendations: Insulin - Basal:  Please consider increasing Lantus to 20 units BID. Correction (SSI): Patient has received a total of Novolog 58 units for correction over the past 24 hours. Please consider changing to ICU Glycemic Control order set to improve inpatient glycemic control. Insulin - Meal Coverage: Please consider ordering Novolog 5 units Q4H for tube feeding coverage. If tube feeding is held or discontinued then Novolog tube feeding coverage would also be held or discontinued.  Thanks, Orlando Penner, RN, MSN, CDE Diabetes Coordinator Inpatient Diabetes Program [phone] (Team Pager from 8am to 5pm) [phone] ([facility] office) [phone] [facility] office) [phone] [facility] office)

## 2016-01-17 NOTE — Progress Notes (Signed)
Nutrition Follow-up  DOCUMENTATION CODES:   Obesity unspecified  INTERVENTION:   Vital High Protein @ 40 ml/hr 60 ml Prostat TID Provides: 1560 kcal, 174 grams protein, 802 ml free H2O, in 960 ml.  Total free water: 3360 ml  NUTRITION DIAGNOSIS:   Inadequate oral intake related to inability to eat as evidenced by NPO status. Ongoing.   GOAL:   Provide needs based on ASPEN/SCCM guidelines Met.   MONITOR:   TF tolerance, Skin, Vent status  ASSESSMENT:   65 y/o male with CAD, CHF had a cardiac arrest (PEA/Vfib) on 7/10 and received 1 hour, 29 minutes of CPR.   Poor prognosis noted.  MD increasing free water with elevated Na, BUN/Cr.   Patient is currently intubated on ventilator support Temp (24hrs), Avg:99.1 F (37.3 C), Min:98.4 F (36.9 C), Max:99.8 F (37.7 C)  Medications reviewed and include: lantus, senokot Labs reviewed: Na 160, BUN/Cr 64/1.44, Magnesium 2.8 CBG's: 193-275 OG tube  Free water: 400 ml every 4 hours = 2400 ml   Diet Order:    NPO  Skin:  Wound (see comment) (chest burn from defibrillator pad)  Last BM:  7/15  Height:   Ht Readings from Last 1 Encounters:  01/02/2016 6' (1.829 m)    Weight:   Wt Readings from Last 1 Encounters:  01/17/16 230 lb 2.6 oz (104.4 kg)    Ideal Body Weight:  80.9 kg  BMI:  Body mass index is 31.21 kg/(m^2).  Estimated Nutritional Needs:   Kcal:  8250-5397  Protein:  >/= 161 grams  Fluid:  > 1.5 L/day  EDUCATION NEEDS:   No education needs identified at this time  [place], [place], [place] Pager [phone] After Hours Pager

## 2016-01-17 NOTE — Progress Notes (Signed)
Subjective: Myoclonus improved with keppra   Exam: Filed Vitals:   01/17/16 0600 01/17/16 0846  BP: 138/72 125/67  Pulse: 82 85  Temp:    Resp: 24 37   Gen: In bed, NAD Resp: non-labored breathing, no acute distress Abd: soft, nt  With sitmulation, he has some multi- focal myoclonus.   Neuro: MS: opens eyes to noxious sitmuli MW:NUUVO, corneals intact Motor: minimal flexion bilateral arms, no movements bilateral legs.  Sensory: As above  Impression: 65 year old male now day 8 status post anoxic brain injury, given that he had a hypothermia, he may be reasonable to wait 1 week from rewarming but my suspicion is that he has a poor prognosis at this point. The myoclonus does not generalized, and seems to be clearly stimulus provoked.   Recommendations: 1) continue keppra 1gm BID  Ritta Slot, MD [facility] [phone]  If 7pm- 7am, please page neurology on call as listed in AMION.

## 2016-01-17 NOTE — Progress Notes (Signed)
PULMONARY / CRITICAL CARE MEDICINE   Name: William Pacheco MRN: [id number] DOB: 08-10-1950    ADMISSION DATE:  2016-01-24 CONSULTATION DATE:  01/24/16  REFERRING MD:  Dr. Elesa Massed  CHIEF COMPLAINT:  Found down, cardiac arrest  BRIEF: 65 y/o male with CAD, CHF had a cardiac arrest (PEA/Vfib) on 7/10 and received 1 hour, 29 minutes of CPR.     SUBJECTIVE:  No major events overnight, remains completely unresponsive.  Did not tolerate PS trials this AM.  VITAL SIGNS: BP 112/58 mmHg  Pulse 79  Temp(Src) 98.9 F (37.2 C) (Oral)  Resp 20  Ht 6' (1.829 m)  Wt 104.4 kg (230 lb 2.6 oz)  BMI 31.21 kg/m2  SpO2 97%  HEMODYNAMICS:    VENTILATOR SETTINGS: Vent Mode:  [-] PCV FiO2 (%):  [30 %-40 %] 30 % Set Rate:  [14 bmp] 14 bmp PEEP:  [5 cmH20] 5 cmH20 Pressure Support:  [8 cmH20] 8 cmH20 Plateau Pressure:  [20 cmH20-31 cmH20] 31 cmH20  INTAKE / OUTPUT: I/O last 3 completed shifts: In: 4846.5 [I.V.:1281.5; NG/GT:3145; IV Piggyback:420] Out: 3530 [Urine:3530]  PHYSICAL EXAMINATION: General:  No distress, unresponsive, grimace to painful stimuli. Neuro: Not responsive to stimulation. No change in neuro exam, does not withdraw to pain. HENT: ETT in place, [place]/AT, PERRL, EOM-I and MMM. PULM: Clear, anteriorly. CV: RRR, no MRG GI: BS+, soft, nontender, non distended MSK: normal tone and bulk Skin: No rash  LABS:  BMET  Recent Labs Lab 01/15/16 0343 01/16/16 0330 01/17/16 0439  NA 161* 157* 160*  K 3.8 3.6 3.7  CL 129* 128* 128*  CO2 26 24 23   BUN 77* 76* 69*  CREATININE 1.66* 1.54* 1.44*  GLUCOSE 257* 270* 291*   Electrolytes  Recent Labs Lab 01/12/16 0435 01/13/16 0340  01/15/16 0343 01/16/16 0330 01/17/16 0439  CALCIUM 8.3* 8.8*  < > 9.1 8.9 8.7*  MG 2.7* 3.0*  --   --   --  2.8*  PHOS 3.2 2.7  --   --   --  3.0  < > = values in this interval not displayed.  CBC  Recent Labs Lab 01/15/16 0343 01/16/16 0330 01/17/16 0439  WBC 10.2 10.2 11.5*   HGB 12.1* 10.9* 10.1*  HCT 38.2* 35.5* 33.5*  PLT 214 249 275   Coag's  Recent Labs Lab 01/11/16 0800 01/11/16 1816 01/12/16 0435  APTT 171* 110*  --   INR  --   --  1.23   Sepsis Markers  Recent Labs Lab 01/11/16 0437 01/12/16 0435  PROCALCITON 10.37 5.18   ABG  Recent Labs Lab 01/12/16 0400 01/13/16 0550 01/17/16 0350  PHART 7.471* 7.512* 7.482*  PCO2ART 31.4* 31.6* 31.0*  PO2ART 62.3* 74.7* 83.6    Liver Enzymes  Recent Labs Lab 01/12/16 0435  AST 47*  ALT 146*  ALKPHOS 55  BILITOT 1.3*  ALBUMIN 2.6*    Cardiac Enzymes No results for input(s): TROPONINI, PROBNP in the last 168 hours.  Glucose  Recent Labs Lab 01/16/16 1212 01/16/16 1722 01/16/16 2003 01/16/16 2355 01/17/16 0342 01/17/16 0801  GLUCAP 252* 229* 275* 282* 275* 193*    Imaging Dg Chest Port 1 View  01/17/2016  CLINICAL DATA:  Intubation. EXAM: PORTABLE CHEST 1 VIEW COMPARISON:  01/13/2016. FINDINGS: Endotracheal tube, NG tube in stable position. Prior CABG. Heart size stable. Low lung volumes with mild bibasilar atelectasis, improved from prior exam. No pleural effusion or pneumothorax. IMPRESSION: 1. Lines and tubes in stable position. 2. Low lung volumes  with mild bibasilar atelectasis, improved from prior exam. 3. Prior CABG.  Heart size stable.  No evidence of CHF. Electronically Signed   By: Maisie Fus  Register   On: 01/17/2016 07:14    STUDIES:  CT head 7/10 >> no intracranial acute abnormality; paranasal sinus disease EEG 7/10 > diffuse slowing MRI 7/12 >> Probable hypoxic ischemic injury affecting the thalami, caudate nuclei, medial aspects of the occipital and parietal brain. Questionable hippocampal involvement. No swelling or hemorrhage at this time. MRI 7/14 > Unchanged. Still shows anoxic injury  CULTURES: 7/11 blood > NGTD 7/11 resp > GPC in pair>>> 7/14 blood >   ANTIBIOTICS: 7/11 vanc > 7/11 7/11 cefepime>   SIGNIFICANT EVENTS:  LINES/TUBES: 7/10 ETT  >> 7/10 R Fem CVL >>   DISCUSSION: 65 y/o male with an extensive cardiology history including CAD, Cardiomyopathy and atrial fibrillation admitted after a cardiac arrest which was PEA and at times V-fib requiring 6 electric shocks.  His total time of CPR noted to be well over one hour, so likelihood of a meaningful neurologic outcome is near zero.  As of 7/12 he has an abnormal neurologic exam without evidence of purposeful neurologic movement. Neuro exam continues to be poor MRI showing diffuse ischemic injury. Brainstem reflexes remain intact.   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 PVS P:   RASS goal: 0 Fentanyl as needed for vent synchrony F/U Neurology recs  PULMONARY A: Acute respiratory failure with hypoxemia Acute pulmonary edema Aspiration pneumonia P:   PSV as tolerated, not a trach/peg candidate. VAP prevention bundle. Continue Cefepime for pneumonia, will continue til 7/19 for total of 8 days.  CARDIOVASCULAR A:  Cardiac arrest> Vfib Ischemic cardiomyopathy Atrial fibrillation: Remains in A fib, rate controlled in 100-110's CAD Troponin elevated Cardiogenic shock > resolved Atrial Fibrillation  P:  Tele monitoring Continue to follow cardiology recommendations Continue Amiodarone, Coreg Levophed for BP support, off.  RENAL A:   AKI due to cardiac arrest > improving Not a candidate for HD given severe neurologic injury Hypernatremia P:   Monitor BMET and UOP Replace electrolytes as needed Increased free water 400 q4 D5W at 50 ml/hr. Lasix 40 mg IV q6 x3 doses. Zaroxolyn 5 mg PO x1.  GASTROINTESTINAL A:   No acute issues P:   Pepcid for stress ulcer prophylaxis Tolerating TF Continue senna for bowel regimen  HEMATOLOGIC A:   Heparin Gtt P:  Heparin gtt started for A. Fib Pharmacy following and adjusting per protocol SCD for DVT  prophylaxis  INFECTIOUS A:   Aspiration pneumonia vs HCAP Ongoing fever 7/14 P:   Continue cefepime Follow fever curve and WBC  ENDOCRINE A:   DM   P:   SSI Continue lantus to 15 units bid  FAMILY  - Updates: No family bedside, reportedly they want to wait the full 14 days.  Will wait til next Monday then recommend withdrawal, not a candidate for trach/peg given extreme neurological injury. - Inter-disciplinary family meet or Palliative Care meeting due by: 01/16/16 did not occur, family did not show up, see above.  The patient is critically ill with multiple organ systems failure and requires high complexity decision making for assessment and support, frequent evaluation and titration of therapies, application of advanced monitoring technologies and extensive interpretation of multiple databases.   Critical Care Time devoted to patient care services described in this note is  35  Minutes. This time reflects time  of care of this signee Dr Koren Bound. This critical care time does not reflect procedure time, or teaching time or supervisory time of PA/NP/Med student/Med Resident etc but could involve care discussion time.  Alyson Reedy, M.D. [facility] Pulmonary/Critical Care Medicine. Pager: [phone]. After hours pager: [phone].  01/17/2016, 10:36 AM

## 2016-01-17 NOTE — Progress Notes (Signed)
ANTICOAGULATION CONSULT NOTE - Follow Up Consult  Pharmacy Consult for Heparin Indication: atrial fibrillation  No Known Allergies  Patient Measurements: Height: 6' (182.9 cm) Weight: 230 lb 2.6 oz (104.4 kg) IBW/kg (Calculated) : 77.6 Heparin Dosing Weight: 102.7kg  Vital Signs: Temp: 98.9 F (37.2 C) (07/18 0400) Temp Source: Oral (07/18 0400) BP: 125/67 mmHg (07/18 0846) Pulse Rate: 85 (07/18 0846)  Labs:  Recent Labs  01/15/16 0343  01/15/16 1720 01/16/16 0330 01/17/16 0439  HGB 12.1*  --   --  10.9* 10.1*  HCT 38.2*  --   --  35.5* 33.5*  PLT 214  --   --  249 275  HEPARINUNFRC 0.84*  < > 0.66 0.59 0.43  CREATININE 1.66*  --   --  1.54* 1.44*  < > = values in this interval not displayed.  Estimated Creatinine Clearance: 65.6 mL/min (by C-G formula based on Cr of 1.44).   Medications:  Heparin @ 1000 units/hr  Assessment: 65yom on xarelto pta for afib now s/p cardiac arrest and transitioned to IV heparin. Heparin level remains therapeutic. No bleeding issues noted.  Goal of Therapy:  Heparin level 0.3-0.7 units/ml Monitor platelets by anticoagulation protocol: Yes   Plan:  - Continue heparin gtt 1000 units/hr - Daily HL and CBC  Sherron Monday, PharmD Clinical Pharmacy Resident Pager: [phone] 01/17/2016 8:52 AM

## 2016-01-18 ENCOUNTER — Inpatient Hospital Stay (HOSPITAL_COMMUNITY): Payer: 59

## 2016-01-18 LAB — BASIC METABOLIC PANEL
ANION GAP: 11 (ref 5–15)
BUN: 85 mg/dL — ABNORMAL HIGH (ref 6–20)
CHLORIDE: 121 mmol/L — AB (ref 101–111)
CO2: 25 mmol/L (ref 22–32)
Calcium: 9.4 mg/dL (ref 8.9–10.3)
Creatinine, Ser: 1.89 mg/dL — ABNORMAL HIGH (ref 0.61–1.24)
GFR calc Af Amer: 42 mL/min — ABNORMAL LOW (ref >60)
GFR, EST NON AFRICAN AMERICAN: 36 mL/min — AB (ref >60)
GLUCOSE: 407 mg/dL — AB (ref 65–99)
POTASSIUM: 4.2 mmol/L (ref 3.5–5.1)
Sodium: 157 mmol/L — ABNORMAL HIGH (ref 135–145)

## 2016-01-18 LAB — BLOOD GAS, ARTERIAL
Acid-base deficit: 1.2 mmol/L (ref 0.0–2.0)
BICARBONATE: 21.6 meq/L (ref 20.0–24.0)
Drawn by: 24487
FIO2: 0.3
LHR: 14 {breaths}/min
O2 SAT: 96.2 %
PEEP/CPAP: 5 cmH2O
PH ART: 7.494 — AB (ref 7.350–7.450)
Patient temperature: 100.1
Pressure control: 22 cmH2O
TCO2: 22.4 mmol/L (ref 0–100)
pCO2 arterial: 28.6 mmHg — ABNORMAL LOW (ref 35.0–45.0)
pO2, Arterial: 87.8 mmHg (ref 80.0–100.0)

## 2016-01-18 LAB — CBC
HEMATOCRIT: 35.5 % — AB (ref 39.0–52.0)
HEMOGLOBIN: 10.9 g/dL — AB (ref 13.0–17.0)
MCH: 28.5 pg (ref 26.0–34.0)
MCHC: 30.7 g/dL (ref 30.0–36.0)
MCV: 92.9 fL (ref 78.0–100.0)
PLATELETS: 324 10*3/uL (ref 150–400)
RBC: 3.82 MIL/uL — AB (ref 4.22–5.81)
RDW: 16.7 % — ABNORMAL HIGH (ref 11.5–15.5)
WBC: 14 10*3/uL — AB (ref 4.0–10.5)

## 2016-01-18 LAB — GLUCOSE, CAPILLARY
GLUCOSE-CAPILLARY: 332 mg/dL — AB (ref 65–99)
GLUCOSE-CAPILLARY: 382 mg/dL — AB (ref 65–99)
GLUCOSE-CAPILLARY: 397 mg/dL — AB (ref 65–99)
GLUCOSE-CAPILLARY: 411 mg/dL — AB (ref 65–99)
GLUCOSE-CAPILLARY: 427 mg/dL — AB (ref 65–99)
Glucose-Capillary: 256 mg/dL — ABNORMAL HIGH (ref 65–99)
Glucose-Capillary: 365 mg/dL — ABNORMAL HIGH (ref 65–99)

## 2016-01-18 LAB — CULTURE, BLOOD (ROUTINE X 2)
CULTURE: NO GROWTH
Culture: NO GROWTH

## 2016-01-18 LAB — MAGNESIUM: Magnesium: 2.9 mg/dL — ABNORMAL HIGH (ref 1.7–2.4)

## 2016-01-18 LAB — HEPARIN LEVEL (UNFRACTIONATED): Heparin Unfractionated: 0.54 IU/mL (ref 0.30–0.70)

## 2016-01-18 LAB — PHOSPHORUS: Phosphorus: 3.1 mg/dL (ref 2.5–4.6)

## 2016-01-18 MED ORDER — METOLAZONE 5 MG PO TABS
5.0000 mg | ORAL_TABLET | Freq: Every day | ORAL | Status: AC
  Administered 2016-01-18: 5 mg via ORAL
  Filled 2016-01-18: qty 1

## 2016-01-18 MED ORDER — FUROSEMIDE 10 MG/ML IJ SOLN
40.0000 mg | Freq: Three times a day (TID) | INTRAMUSCULAR | Status: AC
  Administered 2016-01-18 (×2): 40 mg via INTRAVENOUS
  Filled 2016-01-18 (×2): qty 4

## 2016-01-18 MED ORDER — INSULIN GLARGINE 100 UNIT/ML ~~LOC~~ SOLN: 20.0000 [IU] | Freq: Two times a day (BID) | SUBCUTANEOUS | Status: DC

## 2016-01-18 MED ORDER — INSULIN GLARGINE 100 UNIT/ML ~~LOC~~ SOLN
20.0000 [IU] | Freq: Two times a day (BID) | SUBCUTANEOUS | Status: DC
  Administered 2016-01-18 – 2016-01-19 (×2): 20 [IU] via SUBCUTANEOUS
  Filled 2016-01-18 (×3): qty 0.2

## 2016-01-18 NOTE — Progress Notes (Signed)
ANTICOAGULATION CONSULT NOTE - Follow Up Consult  Pharmacy Consult for Heparin Indication: atrial fibrillation  No Known Allergies  Patient Measurements: Height: 6' (182.9 cm) Weight: 220 lb 10.9 oz (100.1 kg) IBW/kg (Calculated) : 77.6 Heparin Dosing Weight: 102.7kg  Vital Signs: Temp: 98.3 F (36.8 C) (07/19 0836) Temp Source: Oral (07/19 0836) BP: 111/54 mmHg (07/19 0815) Pulse Rate: 79 (07/19 0815)  Labs:  Recent Labs  01/16/16 0330 01/17/16 0439 01/18/16 0337 01/18/16 0338  HGB 10.9* 10.1* 10.9*  --   HCT 35.5* 33.5* 35.5*  --   PLT 249 275 324  --   HEPARINUNFRC 0.59 0.43  --  0.54  CREATININE 1.54* 1.44* 1.89*  --     Estimated Creatinine Clearance: 49 mL/min (by C-G formula based on Cr of 1.89).  Medications:  Heparin @ 1000 units/hr  Assessment: 65yom on xarelto pta for afib now s/p cardiac arrest and transitioned to IV heparin. Heparin level remains therapeutic. No bleeding issues noted. Hgb and PLT remain stable.  Goal of Therapy:  Heparin level 0.3-0.7 units/ml Monitor platelets by anticoagulation protocol: Yes   Plan:  - Continue heparin gtt 1000 units/hr - Daily HL and CBC  Sherron Monday, PharmD Clinical Pharmacy Resident Pager: [phone] 01/18/2016 8:49 AM

## 2016-01-18 NOTE — Progress Notes (Signed)
Subjective: No changes  Exam: Filed Vitals:   01/18/16 0815 01/18/16 0836  BP: 111/54   Pulse: 79   Temp:  98.3 F (36.8 C)  Resp: 21    Gen: In bed, NAD Resp: non-labored breathing, no acute distress Abd: soft, nt  No myoclonus  Neuro: MS: opens eyes to noxious sitmuli [id number], corneals intact Motor: minimal flicker of flexion bilateral arms, no movements bilateral legs.  Sensory: As above  Impression: 65 year old male now day 9 status post anoxic brain injury. He has not had meniangful improvements at this time and  my suspicion is that he has a poor prognosis at this point. I feel that he has a very low chance of recovery to any independent level, though always difficult to be absolutely certain.   Family has apparently requested 14 day observation and given that there are no absolute dismal prognostic indicators at this time I feel that this is reasonable. I would not recommend trach/peg unless family is clear that he would want to continue even in a severely compromised neurological state.   Recommendations: 1) continue keppra 1gm BID 2) At this time, I am not certain we have much to offer, we are available to reassess if there are improvements prior to the final decision to withdraw care, but will not be following on a daily basis. Please call with any questions or concerns.   Ritta Slot, MD [facility] [phone]  If 7pm- 7am, please page neurology on call as listed in AMION.

## 2016-01-18 NOTE — Progress Notes (Signed)
Results for ANTWOIN, SAWAYA (MRN [id number]) as of 01/18/2016 13:51  Ref. Range 01/17/2016 20:00 01/18/2016 00:00 01/18/2016 03:28 01/18/2016 08:36 01/18/2016 12:07  Glucose-Capillary Latest Ref Range: 65-99 mg/dL 497 (H) 026 (H) 378 (H) 256 (H) 332 (H)  Noted that blood sugars are elevated.  Recommend increasing Lantus to 20 units BID.  Continue Novolog RESISTANT every 4 hours. Smith Mince RN BSN CDE

## 2016-01-18 NOTE — Progress Notes (Signed)
PULMONARY / CRITICAL CARE MEDICINE   Name: William Pacheco MRN: [id number] DOB: 1950-07-03    ADMISSION DATE:  01/08/2016 CONSULTATION DATE:  01/25/2016  REFERRING MD:  Dr. Elesa Massed  CHIEF COMPLAINT:  Found down, cardiac arrest  BRIEF: 65 y/o male with CAD, CHF had a cardiac arrest (PEA/Vfib) on 7/10 and received 1 hour, 29 minutes of CPR.     SUBJECTIVE:  No major events overnight, remains completely unresponsive.  Tolerating PS trials this AM.  VITAL SIGNS: BP 111/54 mmHg  Pulse 79  Temp(Src) 98.3 F (36.8 C) (Oral)  Resp 21  Ht 6' (1.829 m)  Wt 100.1 kg (220 lb 10.9 oz)  BMI 29.92 kg/m2  SpO2 99%  HEMODYNAMICS:    VENTILATOR SETTINGS: Vent Mode:  [-] PCV FiO2 (%):  [30 %] 30 % Set Rate:  [14 bmp] 14 bmp PEEP:  [5 cmH20] 5 cmH20 Plateau Pressure:  [16 cmH20-24 cmH20] 24 cmH20  INTAKE / OUTPUT: I/O last 3 completed shifts: In: 3712.1 [I.V.:2352.1; NG/GT:830; IV Piggyback:530] Out: 7335 [Urine:7335]  PHYSICAL EXAMINATION: General:  No distress, unresponsive, grimace to painful stimuli. Neuro: Not responsive to stimulation. No change in neuro exam, does not withdraw to pain. HENT: ETT in place, /AT, PERRL, EOM-I and MMM. PULM: Clear, anteriorly. CV: RRR, no MRG GI: BS+, soft, nontender, non distended MSK: normal tone and bulk Skin: No rash  LABS:  BMET  Recent Labs Lab 01/16/16 0330 01/17/16 0439 01/18/16 0337  NA 157* 160* 157*  K 3.6 3.7 4.2  CL 128* 128* 121*  CO2 24 23 25   BUN 76* 69* 85*  CREATININE 1.54* 1.44* 1.89*  GLUCOSE 270* 291* 407*   Electrolytes  Recent Labs Lab 01/13/16 0340  01/16/16 0330 01/17/16 0439 01/18/16 0337  CALCIUM 8.8*  < > 8.9 8.7* 9.4  MG 3.0*  --   --  2.8* 2.9*  PHOS 2.7  --   --  3.0 3.1  < > = values in this interval not displayed.  CBC  Recent Labs Lab 01/16/16 0330 01/17/16 0439 01/18/16 0337  WBC 10.2 11.5* 14.0*  HGB 10.9* 10.1* 10.9*  HCT 35.5* 33.5* 35.5*  PLT 249 275 324    Coag's  Recent Labs Lab 01/11/16 1816 01/12/16 0435  APTT 110*  --   INR  --  1.23   Sepsis Markers  Recent Labs Lab 01/12/16 0435  PROCALCITON 5.18   ABG  Recent Labs Lab 01/13/16 0550 01/17/16 0350 01/18/16 0423  PHART 7.512* 7.482* 7.494*  PCO2ART 31.6* 31.0* 28.6*  PO2ART 74.7* 83.6 87.8   Liver Enzymes  Recent Labs Lab 01/12/16 0435  AST 47*  ALT 146*  ALKPHOS 55  BILITOT 1.3*  ALBUMIN 2.6*   Cardiac Enzymes No results for input(s): TROPONINI, PROBNP in the last 168 hours.  Glucose  Recent Labs Lab 01/17/16 1152 01/17/16 1634 01/17/16 2000 01/18/16 01/18/16 0328 01/18/16 0836  GLUCAP 267* 306* 365* 397* 382* 256*   Imaging Dg Chest Port 1 View  01/18/2016  CLINICAL DATA:  Shortness of breath. EXAM: PORTABLE CHEST 1 VIEW COMPARISON:  01/17/2016. FINDINGS: Endotracheal tube and NG tube in stable position. Prior CABG. Heart size normal. Low lung volumes with mild bibasilar atelectasis. Mild left lower lobe infiltrate cannot be excluded. No pleural effusion or pneumothorax. IMPRESSION: 1. Lines and tubes in stable position. 2. Prior CABG.  Heart size normal. 3. Low lung volumes with mild bibasilar atelectasis. Mild left lower lobe infiltrate cannot be excluded . Electronically Signed   By:  Thomas  Register   On: 01/18/2016 06:57   STUDIES:  CT head 7/10 >> no intracranial acute abnormality; paranasal sinus disease EEG 7/10 > diffuse slowing MRI 7/12 >> Probable hypoxic ischemic injury affecting the thalami, caudate nuclei, medial aspects of the occipital and parietal brain. Questionable hippocampal involvement. No swelling or hemorrhage at this time. MRI 7/14 > Unchanged. Still shows anoxic injury  CULTURES: 7/11 blood > NGTD 7/11 resp > GPC in pair>>> 7/14 blood >   ANTIBIOTICS: 7/11 vanc > 7/11 7/11 cefepime>   SIGNIFICANT EVENTS:  LINES/TUBES: 7/10 ETT >> 7/10 R Fem CVL >>   DISCUSSION: 65 y/o male with an extensive cardiology  history including CAD, Cardiomyopathy and atrial fibrillation admitted after a cardiac arrest which was PEA and at times V-fib requiring 6 electric shocks.  His total time of CPR noted to be well over one hour, so likelihood of a meaningful neurologic outcome is near zero.  As of 7/12 he has an abnormal neurologic exam without evidence of purposeful neurologic movement. Neuro exam continues to be poor MRI showing diffuse ischemic injury. Brainstem reflexes remain intact.   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 PVS P:   RASS goal: 0 Fentanyl as needed for vent synchrony F/U Neurology recs Continue diureses at a lower dose.  PULMONARY A: Acute respiratory failure with hypoxemia Acute pulmonary edema Aspiration pneumonia P:   PSV as tolerated, not a trach/peg candidate given extensive neuro injury. VAP prevention bundle. D/C cefepime 7/19.  CARDIOVASCULAR A:  Cardiac arrest> Vfib Ischemic cardiomyopathy Atrial fibrillation: Remains in A fib, rate controlled in 100-110's CAD Troponin elevated Cardiogenic shock > resolved Atrial Fibrillation  P:  Tele monitoring Continue to follow cardiology recommendations Continue Amiodarone, Coreg Levophed for BP support, off.  RENAL A:   AKI due to cardiac arrest > improving Not a candidate for HD given severe neurologic injury Hypernatremia P:   Monitor BMET and UOP Replace electrolytes as needed Increased free water 400 q4 D5W at 50 ml/hr. Decrease Lasix to 40 mg IV q8x2doses given renal function. Zaroxolyn 5 mg PO x1.  GASTROINTESTINAL A:   No acute issues P:   Pepcid for stress ulcer prophylaxis Tolerating TF Continue senna for bowel regimen  HEMATOLOGIC A:   Heparin Gtt P:  Heparin gtt started for A. Fib Pharmacy following and adjusting per protocol SCD for DVT prophylaxis  INFECTIOUS A:   Aspiration pneumonia vs HCAP Ongoing  fever 7/14 P:   D/C cefepime 7/19 Follow fever curve and WBC  ENDOCRINE A:   DM   P:   SSI Continue lantus to 15 units bid  FAMILY  - Updates: No family bedside, reportedly they want to wait the full 14 days.  Will wait til next Monday then recommend withdrawal, not a candidate for trach/peg given extreme neurological injury. - Inter-disciplinary family meet or Palliative Care meeting due by: 01/16/16 did not occur, family did not show up, see above.  The patient is critically ill with multiple organ systems failure and requires high complexity decision making for assessment and support, frequent evaluation and titration of therapies, application of advanced monitoring technologies and extensive interpretation of multiple databases.   Critical Care Time devoted to patient care services described in this note is  35  Minutes. This time reflects time of care of this signee Dr Koren Bound. This critical care time does not reflect procedure time, or teaching time or supervisory  time of PA/NP/Med student/Med Resident etc but could involve care discussion time.  Alyson Reedy, M.D. [facility] Pulmonary/Critical Care Medicine. Pager: [phone]. After hours pager: [phone].  01/18/2016, 9:44 AM

## 2016-01-18 NOTE — Progress Notes (Signed)
eLink Physician-Brief Progress Note Patient Name: William Pacheco DOB: January 03, 1951 MRN: [id number]   Date of Service  01/18/2016  HPI/Events of Note  Blood glucose = 411. Already on Lantus 15 units BID and Q 4 hour resistant Novolog SSI.  eICU Interventions  Will order: 1. Increase Lantus to 20 units BID per Diabetes Coordinator recommendation.      Intervention Category Intermediate Interventions: Hyperglycemia - evaluation and treatment  Deven Furia Eugene 01/18/2016, 8:08 PM

## 2016-01-19 ENCOUNTER — Inpatient Hospital Stay (HOSPITAL_COMMUNITY): Payer: 59

## 2016-01-19 LAB — BASIC METABOLIC PANEL
Anion gap: 9 (ref 5–15)
BUN: 102 mg/dL — AB (ref 6–20)
CHLORIDE: 114 mmol/L — AB (ref 101–111)
CO2: 23 mmol/L (ref 22–32)
Calcium: 9 mg/dL (ref 8.9–10.3)
Creatinine, Ser: 1.96 mg/dL — ABNORMAL HIGH (ref 0.61–1.24)
GFR calc Af Amer: 40 mL/min — ABNORMAL LOW (ref >60)
GFR calc non Af Amer: 35 mL/min — ABNORMAL LOW (ref >60)
GLUCOSE: 453 mg/dL — AB (ref 65–99)
POTASSIUM: 3.6 mmol/L (ref 3.5–5.1)
Sodium: 146 mmol/L — ABNORMAL HIGH (ref 135–145)

## 2016-01-19 LAB — GLUCOSE, CAPILLARY
GLUCOSE-CAPILLARY: 420 mg/dL — AB (ref 65–99)
GLUCOSE-CAPILLARY: 384 mg/dL — AB (ref 65–99)
GLUCOSE-CAPILLARY: 433 mg/dL — AB (ref 65–99)
Glucose-Capillary: 300 mg/dL — ABNORMAL HIGH (ref 65–99)
Glucose-Capillary: 341 mg/dL — ABNORMAL HIGH (ref 65–99)
Glucose-Capillary: 456 mg/dL — ABNORMAL HIGH (ref 65–99)

## 2016-01-19 LAB — PHOSPHORUS: Phosphorus: 5.2 mg/dL — ABNORMAL HIGH (ref 2.5–4.6)

## 2016-01-19 LAB — BLOOD GAS, ARTERIAL
Acid-base deficit: 1.5 mmol/L (ref 0.0–2.0)
BICARBONATE: 21.3 meq/L (ref 20.0–24.0)
Drawn by: 46203
FIO2: 0.3
LHR: 14 {breaths}/min
O2 SAT: 98.8 %
PATIENT TEMPERATURE: 98.6
PCO2 ART: 27.9 mmHg — AB (ref 35.0–45.0)
PEEP/CPAP: 5 cmH2O
PH ART: 7.495 — AB (ref 7.350–7.450)
Pressure control: 22 cmH2O
TCO2: 22.2 mmol/L (ref 0–100)
pO2, Arterial: 137 mmHg — ABNORMAL HIGH (ref 80.0–100.0)

## 2016-01-19 LAB — CBC
HCT: 34 % — ABNORMAL LOW (ref 39.0–52.0)
HEMOGLOBIN: 10.5 g/dL — AB (ref 13.0–17.0)
MCH: 28.5 pg (ref 26.0–34.0)
MCHC: 30.9 g/dL (ref 30.0–36.0)
MCV: 92.4 fL (ref 78.0–100.0)
Platelets: 320 10*3/uL (ref 150–400)
RBC: 3.68 MIL/uL — AB (ref 4.22–5.81)
RDW: 16.3 % — ABNORMAL HIGH (ref 11.5–15.5)
WBC: 15.6 10*3/uL — ABNORMAL HIGH (ref 4.0–10.5)

## 2016-01-19 LAB — MAGNESIUM: Magnesium: 2.9 mg/dL — ABNORMAL HIGH (ref 1.7–2.4)

## 2016-01-19 LAB — HEPARIN LEVEL (UNFRACTIONATED): Heparin Unfractionated: 0.6 IU/mL (ref 0.30–0.70)

## 2016-01-19 MED ORDER — FUROSEMIDE 10 MG/ML IJ SOLN
40.0000 mg | Freq: Three times a day (TID) | INTRAMUSCULAR | Status: AC
  Administered 2016-01-19 (×2): 40 mg via INTRAVENOUS
  Filled 2016-01-19 (×2): qty 4

## 2016-01-19 MED ORDER — INSULIN GLARGINE 100 UNIT/ML ~~LOC~~ SOLN
25.0000 [IU] | Freq: Two times a day (BID) | SUBCUTANEOUS | Status: DC
  Administered 2016-01-19 – 2016-01-20 (×2): 25 [IU] via SUBCUTANEOUS
  Filled 2016-01-19 (×3): qty 0.25

## 2016-01-19 MED ORDER — METOLAZONE 5 MG PO TABS
5.0000 mg | ORAL_TABLET | Freq: Every day | ORAL | Status: AC
  Administered 2016-01-19: 5 mg via ORAL
  Filled 2016-01-19: qty 1

## 2016-01-19 NOTE — Progress Notes (Signed)
ANTICOAGULATION CONSULT NOTE - Follow Up Consult  Pharmacy Consult for Heparin Indication: atrial fibrillation  No Known Allergies  Patient Measurements: Height: 6' (182.9 cm) Weight: 224 lb 6.9 oz (101.8 kg) IBW/kg (Calculated) : 77.6 Heparin Dosing Weight: 102.7kg  Vital Signs: Temp: 97.9 F (36.6 C) (07/20 0800) Temp Source: Oral (07/20 0800) BP: 117/91 mmHg (07/20 0824) Pulse Rate: 67 (07/20 0824)  Labs:  Recent Labs  01/17/16 0439 01/18/16 0337 01/18/16 0338 01/19/16 0111 01/19/16 0112  HGB 10.1* 10.9*  --   --  10.5*  HCT 33.5* 35.5*  --   --  34.0*  PLT 275 324  --   --  320  HEPARINUNFRC 0.43  --  0.54 0.60  --   CREATININE 1.44* 1.89*  --   --  1.96*    Estimated Creatinine Clearance: 47.6 mL/min (by C-G formula based on Cr of 1.96).  Medications:  Heparin @ 1000 units/hr  Assessment: 65yom on xarelto pta for afib now s/p cardiac arrest and transitioned to IV heparin. Heparin level remains therapeutic. No bleeding issues noted. Hgb low but stable and PLT wnl.  Goal of Therapy:  Heparin level 0.3-0.7 units/ml Monitor platelets by anticoagulation protocol: Yes   Plan:  1) Continue heparin gtt 1000 units/hr 2) Daily HL and CBC  Sherron Monday, PharmD Clinical Pharmacy Resident Pager: [phone] 01/19/2016 8:52 AM

## 2016-01-19 NOTE — Progress Notes (Signed)
0000 CBG 427, MD notified and ordered to just give 20 units of novolog. Will continue to monitor.

## 2016-01-19 NOTE — Progress Notes (Signed)
PULMONARY / CRITICAL CARE MEDICINE   Name: William Pacheco MRN: [id number] DOB: [birth year]    ADMISSION DATE:  01/24/2016 CONSULTATION DATE:  01/21/2016  REFERRING MD:  Dr. Elesa Massed  CHIEF COMPLAINT:  Found down, cardiac arrest  BRIEF: 65 y/o male with CAD, CHF had a cardiac arrest (PEA/Vfib) on 7/10 and received 1 hour, 29 minutes of CPR.     SUBJECTIVE:  No major events overnight, remains completely unresponsive.    VITAL SIGNS: BP 106/64 mmHg  Pulse 73  Temp(Src) 97.9 F (36.6 C) (Oral)  Resp 21  Ht 6' (1.829 m)  Wt 101.8 kg (224 lb 6.9 oz)  BMI 30.43 kg/m2  SpO2 97%  HEMODYNAMICS:    VENTILATOR SETTINGS: Vent Mode:  [-] CPAP;PSV FiO2 (%):  [30 %] 30 % Set Rate:  [14 bmp] 14 bmp PEEP:  [5 cmH20] 5 cmH20 Pressure Support:  [8 cmH20] 8 cmH20 Plateau Pressure:  [16 cmH20-17 cmH20] 16 cmH20  INTAKE / OUTPUT: I/O last 3 completed shifts: In: 7211.2 [I.V.:2901.2; Other:200; NG/GT:3370; IV Piggyback:740] Out: 7085 [Urine:7085]  PHYSICAL EXAMINATION: General:  No distress, unresponsive, grimace to painful stimuli. Neuro: Not responsive to stimulation. No change in neuro exam, does not withdraw to pain. HENT: ETT in place, [place]/AT, PERRL, EOM-I and MMM. PULM: Clear, anteriorly. CV: RRR, no MRG GI: BS+, soft, nontender, non distended MSK: normal tone and bulk Skin: No rash  LABS:  BMET  Recent Labs Lab 01/17/16 0439 01/18/16 0337 01/19/16 0112  NA 160* 157* 146*  K 3.7 4.2 3.6  CL 128* 121* 114*  CO2 23 25 23   BUN 69* 85* 102*  CREATININE 1.44* 1.89* 1.96*  GLUCOSE 291* 407* 453*   Electrolytes  Recent Labs Lab 01/17/16 0439 01/18/16 0337 01/19/16 0112  CALCIUM 8.7* 9.4 9.0  MG 2.8* 2.9* 2.9*  PHOS 3.0 3.1 5.2*    CBC  Recent Labs Lab 01/17/16 0439 01/18/16 0337 01/19/16 0112  WBC 11.5* 14.0* 15.6*  HGB 10.1* 10.9* 10.5*  HCT 33.5* 35.5* 34.0*  PLT 275 324 320   Coag's No results for input(s): APTT, INR in the last 168 hours. Sepsis  Markers No results for input(s): LATICACIDVEN, PROCALCITON, O2SATVEN in the last 168 hours. ABG  Recent Labs Lab 01/17/16 0350 01/18/16 0423 01/19/16 0310  PHART 7.482* 7.494* 7.495*  PCO2ART 31.0* 28.6* 27.9*  PO2ART 83.6 87.8 137*   Liver Enzymes No results for input(s): AST, ALT, ALKPHOS, BILITOT, ALBUMIN in the last 168 hours. Cardiac Enzymes No results for input(s): TROPONINI, PROBNP in the last 168 hours.  Glucose  Recent Labs Lab 01/18/16 1207 01/18/16 1659 01/18/16 1929 01/18/16 2350 01/19/16 0419 01/19/16 0814  GLUCAP 332* 365* 411* 427* 341* 300*   Imaging Dg Chest Port 1 View  01/19/2016  CLINICAL DATA:  Intubation. EXAM: PORTABLE CHEST 1 VIEW COMPARISON:  01/18/2016. FINDINGS: Endotracheal tube and NG tube in stable position. Prior CABG. Heart size stable. Low lung volumes with mild bibasilar subsegmental axis. No pleural effusion or pneumothorax. IMPRESSION: 1. Lines and tubes in stable position. 2. Prior CABG.  Heart size stable. 3. Low lung volumes with mild bibasilar atelectasis. Chest is unchanged from prior exam. Electronically Signed   By: Maisie Fus  Register   On: 01/19/2016 06:47   STUDIES:  CT head 7/10 >> no intracranial acute abnormality; paranasal sinus disease EEG 7/10 > diffuse slowing MRI 7/12 >> Probable hypoxic ischemic injury affecting the thalami, caudate nuclei, medial aspects of the occipital and parietal brain. Questionable hippocampal involvement. No swelling  or hemorrhage at this time. MRI 7/14 > Unchanged. Still shows anoxic injury  CULTURES: 7/11 blood > NGTD 7/11 resp > GPC in pair>>> 7/14 blood >   ANTIBIOTICS: 7/11 vanc > 7/11 7/11 cefepime>   SIGNIFICANT EVENTS:  LINES/TUBES: 7/10 ETT >> 7/10 R Fem CVL >>   DISCUSSION: 65 y/o male with an extensive cardiology history including CAD, Cardiomyopathy and atrial fibrillation admitted after a cardiac arrest which was PEA and at times V-fib requiring 6 electric shocks.  His  total time of CPR noted to be well over one hour, so likelihood of a meaningful neurologic outcome is near zero.  As of 7/12 he has an abnormal neurologic exam without evidence of purposeful neurologic movement. Neuro exam continues to be poor MRI showing diffuse ischemic injury. Brainstem reflexes remain intact.   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 PVS P:   RASS goal: 0 D/C all sedation. F/U Neurology recs Discussed with neuro, not a trach/peg candidate due to poor prognosis.  PULMONARY A: Acute respiratory failure with hypoxemia Acute pulmonary edema Aspiration pneumonia P:   PSV as tolerated, not a trach/peg candidate given extensive neuro injury. VAP prevention bundle. D/C cefepime 7/19. Lasix.  CARDIOVASCULAR A:  Cardiac arrest> Vfib Ischemic cardiomyopathy Atrial fibrillation: Remains in A fib, rate controlled in 100-110's CAD Troponin elevated Cardiogenic shock > resolved Atrial Fibrillation  P:  Tele monitoring Continue to follow cardiology recommendations Continue Amiodarone, Coreg Levophed for BP support, off.  RENAL A:   AKI due to cardiac arrest > improving Not a candidate for HD given severe neurologic injury Hypernatremia P:   Monitor BMET and UOP Replace electrolytes as needed Increased free water 400 q4 D5W at 50 ml/hr. Decrease Lasix to 40 mg IV q8x2doses given renal function. Zaroxolyn 5 mg PO x1.  GASTROINTESTINAL A:   No acute issues P:   Pepcid for stress ulcer prophylaxis Tolerating TF Continue senna for bowel regimen  HEMATOLOGIC A:   Heparin Gtt P:  Heparin gtt started for A. Fib Pharmacy following and adjusting per protocol SCD for DVT prophylaxis  INFECTIOUS A:   Aspiration pneumonia vs HCAP Ongoing fever 7/14 P:   D/C cefepime 7/19 Follow fever curve and WBC  ENDOCRINE A:   DM   P:   SSI Continue lantus to 15 units  bid  FAMILY  - Updates: No family bedside, reportedly they want to wait the full 14 days.  Will wait til next Monday then recommend withdrawal, not a candidate for trach/peg given extreme neurological injury. - Inter-disciplinary family meet or Palliative Care meeting due by: 01/16/16 did not occur, family did not show up, see above.  The patient is critically ill with multiple organ systems failure and requires high complexity decision making for assessment and support, frequent evaluation and titration of therapies, application of advanced monitoring technologies and extensive interpretation of multiple databases.   Critical Care Time devoted to patient care services described in this note is  35  Minutes. This time reflects time of care of this signee Dr Koren Bound. This critical care time does not reflect procedure time, or teaching time or supervisory time of PA/NP/Med student/Med Resident etc but could involve care discussion time.  Alyson Reedy, M.D. [facility] Pulmonary/Critical Care Medicine. Pager: [phone]. After hours pager: [phone].  01/19/2016, 11:32 AM

## 2016-01-20 ENCOUNTER — Inpatient Hospital Stay (HOSPITAL_COMMUNITY): Payer: 59

## 2016-01-20 LAB — BASIC METABOLIC PANEL
ANION GAP: 13 (ref 5–15)
BUN: 113 mg/dL — ABNORMAL HIGH (ref 6–20)
CALCIUM: 9.1 mg/dL (ref 8.9–10.3)
CO2: 25 mmol/L (ref 22–32)
Chloride: 108 mmol/L (ref 101–111)
Creatinine, Ser: 1.84 mg/dL — ABNORMAL HIGH (ref 0.61–1.24)
GFR calc Af Amer: 43 mL/min — ABNORMAL LOW (ref >60)
GFR, EST NON AFRICAN AMERICAN: 37 mL/min — AB (ref >60)
GLUCOSE: 354 mg/dL — AB (ref 65–99)
Potassium: 3.2 mmol/L — ABNORMAL LOW (ref 3.5–5.1)
Sodium: 146 mmol/L — ABNORMAL HIGH (ref 135–145)

## 2016-01-20 LAB — POCT I-STAT 3, ART BLOOD GAS (G3+)
Acid-Base Excess: 1 mmol/L (ref 0.0–2.0)
BICARBONATE: 23.4 meq/L (ref 20.0–24.0)
O2 SAT: 99 %
PCO2 ART: 29.8 mmHg — AB (ref 35.0–45.0)
PO2 ART: 118 mmHg — AB (ref 80.0–100.0)
Patient temperature: 98.5
TCO2: 24 mmol/L (ref 0–100)
pH, Arterial: 7.503 — ABNORMAL HIGH (ref 7.350–7.450)

## 2016-01-20 LAB — GLUCOSE, CAPILLARY
GLUCOSE-CAPILLARY: 437 mg/dL — AB (ref 65–99)
GLUCOSE-CAPILLARY: 352 mg/dL — AB (ref 65–99)
GLUCOSE-CAPILLARY: 428 mg/dL — AB (ref 65–99)
Glucose-Capillary: 332 mg/dL — ABNORMAL HIGH (ref 65–99)
Glucose-Capillary: 273 mg/dL — ABNORMAL HIGH (ref 65–99)
Glucose-Capillary: 361 mg/dL — ABNORMAL HIGH (ref 65–99)

## 2016-01-20 LAB — CBC
HCT: 35 % — ABNORMAL LOW (ref 39.0–52.0)
Hemoglobin: 11 g/dL — ABNORMAL LOW (ref 13.0–17.0)
MCH: 28.3 pg (ref 26.0–34.0)
MCHC: 31.4 g/dL (ref 30.0–36.0)
MCV: 90 fL (ref 78.0–100.0)
PLATELETS: 369 10*3/uL (ref 150–400)
RBC: 3.89 MIL/uL — ABNORMAL LOW (ref 4.22–5.81)
RDW: 15.7 % — AB (ref 11.5–15.5)
WBC: 14.2 10*3/uL — AB (ref 4.0–10.5)

## 2016-01-20 LAB — HEPARIN LEVEL (UNFRACTIONATED): HEPARIN UNFRACTIONATED: 0.56 [IU]/mL (ref 0.30–0.70)

## 2016-01-20 LAB — MAGNESIUM: Magnesium: 3.2 mg/dL — ABNORMAL HIGH (ref 1.7–2.4)

## 2016-01-20 LAB — PHOSPHORUS: Phosphorus: 4.5 mg/dL (ref 2.5–4.6)

## 2016-01-20 MED ORDER — INSULIN GLARGINE 100 UNIT/ML ~~LOC~~ SOLN
35.0000 [IU] | Freq: Two times a day (BID) | SUBCUTANEOUS | Status: DC
Start: 2016-01-20 — End: 2016-01-21
  Administered 2016-01-20 – 2016-01-21 (×2): 35 [IU] via SUBCUTANEOUS
  Filled 2016-01-20 (×3): qty 0.35

## 2016-01-20 MED ORDER — POTASSIUM CHLORIDE 20 MEQ/15ML (10%) PO SOLN
40.0000 meq | ORAL | Status: AC
  Administered 2016-01-20 (×2): 40 meq
  Filled 2016-01-20 (×2): qty 30

## 2016-01-20 MED ORDER — INSULIN ASPART 100 UNIT/ML ~~LOC~~ SOLN
20.0000 [IU] | Freq: Once | SUBCUTANEOUS | Status: AC
  Administered 2016-01-20: 20 [IU] via SUBCUTANEOUS

## 2016-01-20 MED ORDER — DEXTROSE 5 % IV SOLN: INTRAVENOUS | Status: DC

## 2016-01-20 NOTE — Progress Notes (Signed)
ANTICOAGULATION CONSULT NOTE - Follow Up Consult  Pharmacy Consult for Heparin Indication: atrial fibrillation  No Known Allergies  Patient Measurements: Height: 6' (182.9 cm) Weight: 224 lb 10.4 oz (101.9 kg) IBW/kg (Calculated) : 77.6 Heparin Dosing Weight: 102.7kg  Vital Signs: Temp: 98.2 F (36.8 C) (07/21 0804) Temp Source: Oral (07/21 0347) BP: 150/74 mmHg (07/21 0900) Pulse Rate: 65 (07/21 0900)  Labs:  Recent Labs  01/18/16 0337 01/18/16 0338 01/19/16 0111 01/19/16 0112 01/20/16 0430  HGB 10.9*  --   --  10.5* 11.0*  HCT 35.5*  --   --  34.0* 35.0*  PLT 324  --   --  320 369  HEPARINUNFRC  --  0.54 0.60  --  0.56  CREATININE 1.89*  --   --  1.96* 1.84*    Estimated Creatinine Clearance: 50.7 mL/min (by C-G formula based on Cr of 1.84).    Assessment: 65yom on xarelto pta for afib now s/p cardiac arrest and transitioned to IV heparin. Heparin dripm 1000 uts/hr Heparin level 0.58 remains therapeutic. No bleeding issues noted. Hgb low but stable and PLT wnl.  Goal of Therapy:  Heparin level 0.3-0.7 units/ml Monitor platelets by anticoagulation protocol: Yes   Plan:  1) Continue heparin gtt 1000 units/hr 2) Daily HL and CBC  Leota Sauers Pharm.D. CPP, BCPS Clinical Pharmacist [phone] 01/20/2016 11:03 AM

## 2016-01-20 NOTE — Progress Notes (Signed)
Pt placed back on full support for increased WOB, breath stacking. Pt tolerating full support at this time. RT will continue to monitor.

## 2016-01-20 NOTE — Progress Notes (Signed)
PULMONARY / CRITICAL CARE MEDICINE   Name: William Pacheco MRN: [id number] DOB: 03-03-1951    ADMISSION DATE:  01/12/2016 CONSULTATION DATE:  01/01/2016  REFERRING MD:  Dr. Elesa Massed  CHIEF COMPLAINT:  Found down, cardiac arrest  BRIEF: 65 y/o male with CAD, CHF had a cardiac arrest (PEA/Vfib) on 7/10 and received 1 hour, 29 minutes of CPR.     SUBJECTIVE:  No major events overnight, remains completely unresponsive but tolerating weaning this AM.  VITAL SIGNS: BP 150/74 mmHg  Pulse 65  Temp(Src) 98.2 F (36.8 C) (Oral)  Resp 19  Ht 6' (1.829 m)  Wt 101.9 kg (224 lb 10.4 oz)  BMI 30.46 kg/m2  SpO2 98%  HEMODYNAMICS:    VENTILATOR SETTINGS: Vent Mode:  [-] PSV FiO2 (%):  [30 %] 30 % Set Rate:  [14 bmp] 14 bmp PEEP:  [5 cmH20] 5 cmH20 Pressure Support:  [5 cmH20-8 cmH20] 5 cmH20 Plateau Pressure:  [17 cmH20-18 cmH20] 18 cmH20  INTAKE / OUTPUT: I/O last 3 completed shifts: In: 4614.5 [I.V.:2684.5; NG/GT:1550; IV Piggyback:380] Out: 5765 [Urine:5765]  PHYSICAL EXAMINATION: General:  No distress, unresponsive, grimace to painful stimuli. Neuro: Not responsive to stimulation. No change in neuro exam, does not withdraw to pain. HENT: ETT in place, [place]/AT, PERRL, EOM-I and MMM. PULM: Clear, anteriorly. CV: RRR, no MRG GI: BS+, soft, nontender, non distended MSK: normal tone and bulk Skin: No rash  LABS:  BMET  Recent Labs Lab 01/18/16 0337 01/19/16 0112 01/20/16 0430  NA 157* 146* 146*  K 4.2 3.6 3.2*  CL 121* 114* 108  CO2 BUN 85* 102* 113*  CREATININE 1.89* 1.96* 1.84*  GLUCOSE 407* 453* 354*   Electrolytes  Recent Labs Lab 01/18/16 0337 01/19/16 0112 01/20/16 0430  CALCIUM 9.4 9.0 9.1  MG 2.9* 2.9* 3.2*  PHOS 3.1 5.2* 4.5    CBC  Recent Labs Lab 01/18/16 0337 01/19/16 0112 01/20/16 0430  WBC 14.0* 15.6* 14.2*  HGB 10.9* 10.5* 11.0*  HCT 35.5* 34.0* 35.0*  PLT 324 320 369   Coag's No results for input(s): APTT, INR in the  last 168 hours. Sepsis Markers No results for input(s): LATICACIDVEN, PROCALCITON, O2SATVEN in the last 168 hours. ABG  Recent Labs Lab 01/18/16 0423 01/19/16 0310 01/20/16 0358  PHART 7.494* 7.495* 7.503*  PCO2ART 28.6* 27.9* 29.8*  PO2ART 87.8 137* 118.0*   Liver Enzymes No results for input(s): AST, ALT, ALKPHOS, BILITOT, ALBUMIN in the last 168 hours. Cardiac Enzymes No results for input(s): TROPONINI, PROBNP in the last 168 hours.  Glucose  Recent Labs Lab 01/19/16 1715 01/19/16 2017 01/19/16 2351 01/20/16 0120 01/20/16 0411 01/20/16 0920  GLUCAP 384* 433* 456* 437* 332* 273*   Imaging Dg Chest Port 1 View  01/20/2016  CLINICAL DATA:  Intubation. EXAM: PORTABLE CHEST 1 VIEW COMPARISON:  01/19/2016. FINDINGS: Endotracheal tube, NG tube in stable position. Prior CABG. Heart size stable. Low lung volumes with mild bibasilar atelectasis. Mild left lower lobe infiltrate cannot be excluded. Left costophrenic angle not imaged. No pleural effusion identified. No pneumothorax. IMPRESSION: 1. Lines and tubes in stable position. 2. Prior CABG.  Heart size stable. 3. Low lung volumes with mild bibasilar atelectasis. Mild left lower lobe infiltrate cannot be excluded. Electronically Signed   By: Maisie Fus  Register   On: 01/20/2016 07:19   STUDIES:  CT head 7/10 >> no intracranial acute abnormality; paranasal sinus disease EEG 7/10 > diffuse slowing MRI 7/12 >> Probable hypoxic ischemic injury affecting  the thalami, caudate nuclei, medial aspects of the occipital and parietal brain. Questionable hippocampal involvement. No swelling or hemorrhage at this time. MRI 7/14 > Unchanged. Still shows anoxic injury  CULTURES: 7/11 blood > NGTD 7/11 resp > Normal flora 7/14 blood > NTD  ANTIBIOTICS: 7/11 vanc > 7/11 7/11 cefepime> 7/19  SIGNIFICANT EVENTS:  LINES/TUBES: 7/10 ETT >> 7/10 R Fem CVL >>   DISCUSSION: 65 y/o male with an extensive cardiology history including CAD,  Cardiomyopathy and atrial fibrillation admitted after a cardiac arrest which was PEA and at times V-fib requiring 6 electric shocks.  His total time of CPR noted to be well over one hour, so likelihood of a meaningful neurologic outcome is near zero.  As of 7/12 he has an abnormal neurologic exam without evidence of purposeful neurologic movement. Neuro exam continues to be poor MRI showing diffuse ischemic injury. Brainstem reflexes remain intact.   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 PVS P:   RASS goal: 0 D/Ced all sedation. F/U Neurology recs Discussed with neuro, not a trach/peg candidate due to poor prognosis.  PULMONARY A: Acute respiratory failure with hypoxemia Acute pulmonary edema Aspiration pneumonia P:   5/5 as tolerated but no extubation given neuro status. VAP prevention bundle. Not a candidate for trach/peg.  CARDIOVASCULAR A:  Cardiac arrest> Vfib Ischemic cardiomyopathy Atrial fibrillation: Remains in A fib, rate controlled in 100-110's CAD Troponin elevated Cardiogenic shock > resolved Atrial Fibrillation  P:  Tele monitoring Continue to follow cardiology recommendations Continue Amiodarone, Coreg D/C levophed.  RENAL A:   AKI due to cardiac arrest > improving Not a candidate for HD given severe neurologic injury Hypernatremia P:   Monitor BMET and UOP Replace electrolytes as needed Increased free water 400 q4 D5W at 50 ml/hr x24 more hours. Increase lasix 40 mg IV q6 x3 doses. Zaroxolyn 5 mg PO x1.  GASTROINTESTINAL A:   No acute issues P:   Pepcid for stress ulcer prophylaxis Tolerating TF Continue senna for bowel regimen  HEMATOLOGIC A:   Heparin Gtt P:  Heparin gtt started for A. Fib. Pharmacy following and adjusting per protocol. SCD for DVT prophylaxis.  INFECTIOUS A:   Aspiration pneumonia vs HCAP Ongoing fever 7/14 P:   D/Ced  cefepime 7/19 Follow fever curve and WBC  ENDOCRINE A:   DM   P:   SSI Continue lantus to 15 units bid  FAMILY  - Updates: No family bedside, reportedly they want to wait the full 14 days.  Will wait til next Monday then recommend withdrawal, not a candidate for trach/peg given extreme neurological injury.  - Inter-disciplinary family meet or Palliative Care meeting due by: 01/16/16 did not occur, family did not show up, see above.  The patient is critically ill with multiple organ systems failure and requires high complexity decision making for assessment and support, frequent evaluation and titration of therapies, application of advanced monitoring technologies and extensive interpretation of multiple databases.   Critical Care Time devoted to patient care services described in this note is  35  Minutes. This time reflects time of care of this signee Dr Koren Bound. This critical care time does not reflect procedure time, or teaching time or supervisory time of PA/NP/Med student/Med Resident etc but could involve care discussion time.  Alyson Reedy, M.D. [facility] Pulmonary/Critical Care Medicine. Pager: [phone]. After hours pager: [phone].  01/20/2016, 10:12 AM

## 2016-01-20 NOTE — Progress Notes (Signed)
eLink Physician-Brief Progress Note Patient Name: William Pacheco DOB: [birth year] MRN: [id number]   Date of Service  01/20/2016  HPI/Events of Note  Hyperglycemia with blood sugar slightly improved at 437  eICU Interventions  Additional 20 units of novolog now Recheck blood sugar in 1 hour.  If blood sugar remains elevated will initiate insulin gtt.     Intervention Category Intermediate Interventions: Hyperglycemia - evaluation and treatment  Lynn Sissel 01/20/2016, 1:48 AM

## 2016-01-20 NOTE — Progress Notes (Signed)
eLink Physician-Brief Progress Note Patient Name: William Pacheco DOB: [birth year] MRN: [id number]   Date of Service  01/20/2016  HPI/Events of Note  Hypokalemia  eICU Interventions  Potassium replaced     Intervention Category Intermediate Interventions: Electrolyte abnormality - evaluation and management  Haruki Arnold 01/20/2016, 6:09 AM

## 2016-01-20 NOTE — Progress Notes (Signed)
Pt has a Chief of Staff for Aug. 14, 2017- request for letter to be sent stating pt is in the hospital has been made- letter stating pt was admitted and remains in hospital at this time has been sent to address on the [address] per request. Copy has been placed in shadow chart- and original returned to pt's room for family.

## 2016-01-20 NOTE — Progress Notes (Signed)
Results for LANDUN, CZARNY (MRN [id number]) as of 01/20/2016 09:39  Ref. Range 01/19/2016 17:15 01/19/2016 20:17 01/19/2016 23:51 01/20/2016 01:20 01/20/2016 04:11  Glucose-Capillary Latest Ref Range: 65-99 mg/dL 176 (H) 160 (H) 737 (H) 437 (H) 332 (H)  Noted that CBGs continue to be elevated and greater than 300 mg/dl.  Recommend adding Novolog 4-5 units every 4 hours for tube feed coverage.  Continue Lantus 25 units BID and Novolog RESISTANT correction scale every 4 hours if blood sugars continue to be elevated. Smith Mince RN BSN CDE

## 2016-01-21 ENCOUNTER — Inpatient Hospital Stay (HOSPITAL_COMMUNITY): Payer: 59

## 2016-01-21 LAB — POCT I-STAT 3, ART BLOOD GAS (G3+)
Bicarbonate: 22.4 mEq/L (ref 20.0–24.0)
O2 Saturation: 99 %
PH ART: 7.508 — AB (ref 7.350–7.450)
PO2 ART: 143 mmHg — AB (ref 80.0–100.0)
TCO2: 23 mmol/L (ref 0–100)
pCO2 arterial: 28.1 mmHg — ABNORMAL LOW (ref 35.0–45.0)

## 2016-01-21 LAB — CBC
HEMATOCRIT: 32.4 % — AB (ref 39.0–52.0)
HEMOGLOBIN: 10.3 g/dL — AB (ref 13.0–17.0)
MCH: 28.5 pg (ref 26.0–34.0)
MCHC: 31.8 g/dL (ref 30.0–36.0)
MCV: 89.8 fL (ref 78.0–100.0)
Platelets: 341 10*3/uL (ref 150–400)
RBC: 3.61 MIL/uL — AB (ref 4.22–5.81)
RDW: 15.7 % — ABNORMAL HIGH (ref 11.5–15.5)
WBC: 11.9 10*3/uL — ABNORMAL HIGH (ref 4.0–10.5)

## 2016-01-21 LAB — BASIC METABOLIC PANEL
ANION GAP: 9 (ref 5–15)
BUN: 102 mg/dL — ABNORMAL HIGH (ref 6–20)
CALCIUM: 8.6 mg/dL — AB (ref 8.9–10.3)
CO2: 24 mmol/L (ref 22–32)
Chloride: 110 mmol/L (ref 101–111)
Creatinine, Ser: 1.64 mg/dL — ABNORMAL HIGH (ref 0.61–1.24)
GFR calc non Af Amer: 43 mL/min — ABNORMAL LOW (ref >60)
GFR, EST AFRICAN AMERICAN: 50 mL/min — AB (ref >60)
Glucose, Bld: 407 mg/dL — ABNORMAL HIGH (ref 65–99)
POTASSIUM: 3.8 mmol/L (ref 3.5–5.1)
Sodium: 143 mmol/L (ref 135–145)

## 2016-01-21 LAB — GLUCOSE, CAPILLARY
GLUCOSE-CAPILLARY: 403 mg/dL — AB (ref 65–99)
GLUCOSE-CAPILLARY: 345 mg/dL — AB (ref 65–99)
Glucose-Capillary: 431 mg/dL — ABNORMAL HIGH (ref 65–99)

## 2016-01-21 LAB — MAGNESIUM: MAGNESIUM: 3.4 mg/dL — AB (ref 1.7–2.4)

## 2016-01-21 LAB — PHOSPHORUS: PHOSPHORUS: 3.7 mg/dL (ref 2.5–4.6)

## 2016-01-21 LAB — HEPARIN LEVEL (UNFRACTIONATED): Heparin Unfractionated: 0.46 IU/mL (ref 0.30–0.70)

## 2016-01-21 MED ORDER — MORPHINE SULFATE 25 MG/ML IV SOLN
2.0000 mg/h | INTRAVENOUS | Status: DC
  Administered 2016-01-21: 2 mg/h via INTRAVENOUS
  Filled 2016-01-21: qty 10

## 2016-01-21 MED ORDER — ATROPINE SULFATE 1 % OP SOLN
2.0000 [drp] | Freq: Four times a day (QID) | OPHTHALMIC | Status: DC
  Administered 2016-01-21 (×3): 2 [drp] via SUBLINGUAL
  Filled 2016-01-21: qty 2

## 2016-01-21 MED ORDER — MORPHINE SULFATE (PF) 2 MG/ML IV SOLN
2.0000 mg | INTRAVENOUS | Status: DC | PRN
  Administered 2016-01-21 (×4): 4 mg via INTRAVENOUS
  Filled 2016-01-21 (×4): qty 2

## 2016-01-21 NOTE — Progress Notes (Signed)
PULMONARY / CRITICAL CARE MEDICINE   Name: William Pacheco MRN: [id number] DOB: [birth year]    ADMISSION DATE:  01/27/2016 CONSULTATION DATE:  01/21/2016  REFERRING MD:  Dr. Leonides Schanz  CHIEF COMPLAINT:  Found down, cardiac arrest  BRIEF: 65 y/o male with CAD, CHF had a cardiac arrest (PEA/Vfib) on 7/10 and received 1 hour, 29 minutes of CPR.     SUBJECTIVE:  No new issues.  VITAL SIGNS: BP 116/56 mmHg  Pulse 61  Temp(Src) 98.5 F (36.9 C) (Oral)  Resp 17  Ht 6' (1.829 m)  Wt 225 lb 8.5 oz (102.3 kg)  BMI 30.58 kg/m2  SpO2 100%  HEMODYNAMICS:    VENTILATOR SETTINGS: Vent Mode:  [-] PSV;CPAP FiO2 (%):  [30 %] 30 % Set Rate:  [14 bmp] 14 bmp PEEP:  [5 cmH20] 5 cmH20 Pressure Support:  [8 cmH20] 8 cmH20 Plateau Pressure:  [12 cmH20-17 cmH20] 12 cmH20  INTAKE / OUTPUT: I/O last 3 completed shifts: In: 5656.2 [I.V.:2686.2; NG/GT:2640; IV Piggyback:330] Out: 6105 [Urine:6105]  PHYSICAL EXAMINATION: General:  No distress, unresponsive, grimace to painful stimuli. Neuro:  No change in neuro exam, does not withdraw to pain. HENT: ETT in place, [place]/AT, PERRL, EOM-I and MMM. PULM: Clear, anteriorly. No accessory use  CV: RRR, no MRG GI: BS+, soft, nontender, non distended MSK: normal tone and bulk Skin: No rash  LABS:  BMET  Recent Labs Lab 01/19/16 0112 01/20/16 0430 01/21/16 0315  NA 146* 146* 143  K 3.6 3.2* 3.8  CL 114* 108 110  CO2 _0 BUN 102* 113* 102*  CREATININE 1.96* 1.84* 1.64*  GLUCOSE 453* 354* 407*   Electrolytes  Recent Labs Lab 01/19/16 0112 01/20/16 0430 01/21/16 0315  CALCIUM 9.0 9.1 8.6*  MG 2.9* 3.2* 3.4*  PHOS 5.2* 4.5 3.7    CBC  Recent Labs Lab 01/19/16 0112 01/20/16 0430 01/21/16 0315  WBC 15.6* 14.2* 11.9*  HGB 10.5* 11.0* 10.3*  HCT 34.0* 35.0* 32.4*  PLT 320 369 341   Coag's No results for input(s): APTT, INR in the last 168 hours. Sepsis Markers No results for input(s): LATICACIDVEN, PROCALCITON,  O2SATVEN in the last 168 hours. ABG  Recent Labs Lab 01/19/16 0310 01/20/16 0358 01/21/16 0405  PHART 7.495* 7.503* 7.508*  PCO2ART 27.9* 29.8* 28.1*  PO2ART 137* 118.0* 143.0*   Liver Enzymes No results for input(s): AST, ALT, ALKPHOS, BILITOT, ALBUMIN in the last 168 hours. Cardiac Enzymes No results for input(s): TROPONINI, PROBNP in the last 168 hours.  Glucose  Recent Labs Lab 01/20/16 1153 01/20/16 1644 01/20/16 2035 01/21/16 0034 01/21/16 0322 01/21/16 0741  GLUCAP 352* 361* 428* 431* 403* 345*   Imaging No results found. STUDIES:  CT head 7/10 >> no intracranial acute abnormality; paranasal sinus disease EEG 7/10 > diffuse slowing MRI 7/12 >> Probable hypoxic ischemic injury affecting the thalami, caudate nuclei, medial aspects of the occipital and parietal brain. Questionable hippocampal involvement. No swelling or hemorrhage at this time. MRI 7/14 > Unchanged. Still shows anoxic injury  CULTURES: 7/11 blood > NGTD 7/11 resp > Normal flora 7/14 blood > NTD  ANTIBIOTICS: 7/11 vanc > 7/11 7/11 cefepime> 7/19  SIGNIFICANT EVENTS:  LINES/TUBES: 7/10 ETT >> 7/10 R Fem CVL >>   DISCUSSION: 65 y/o male with an extensive cardiology history including CAD, Cardiomyopathy and atrial fibrillation admitted after a cardiac arrest which was PEA and at times V-fib requiring 6 electric shocks.  His total time of CPR noted to be well over  one hour, so likelihood of a meaningful neurologic outcome is near zero.  As of 7/12 he has an abnormal neurologic exam without evidence of purposeful neurologic movement. Neuro exam continues to be poor MRI showing diffuse ischemic injury. Brainstem reflexes remain intact. Awaiting family... As it stands for now the plan is to await 14d to see if there is any evidence of improvement.   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 PVS P:   RASS goal:  0 D/Ced all sedation. F/U Neurology recs Discussed with neuro, not a trach/peg candidate due to poor prognosis.  PULMONARY A: Acute respiratory failure with hypoxemia Acute pulmonary edema Aspiration pneumonia resp alk (iatrogenic) P:   5/5 as tolerated but no extubation given neuro status. PC decreased for rest mode will repeat abg in am  VAP prevention bundle. Not a candidate for trach/peg.  CARDIOVASCULAR A:  Cardiac arrest> Vfib Ischemic cardiomyopathy Atrial fibrillation: Remains in A fib, rate controlled in 100-110's CAD Troponin elevated Cardiogenic shock > resolved Atrial Fibrillation  P:  Tele monitoring Continue to follow cardiology recommendations Continue Amiodarone, Coreg & heparin    RENAL A:   AKI due to cardiac arrest > improving Not a candidate for HD given severe neurologic injury Hypernatremia-->resolved  P:   Monitor BMET and UOP Replace electrolytes as needed cont free water 400 q4 Dc D5w   GASTROINTESTINAL A:   No acute issues P:   Pepcid for stress ulcer prophylaxis Tolerating TF Continue senna for bowel regimen  HEMATOLOGIC A:   Heparin Gtt P:  Heparin gtt started for A. Fib. Pharmacy following and adjusting per protocol. SCD for DVT prophylaxis.  INFECTIOUS A:   Aspiration pneumonia vs HCAP Ongoing fever 7/14 P:   D/Ced cefepime 7/19 Follow fever curve and WBC  ENDOCRINE A:   DM   P:   SSI Continue lantus to 15 units bid  FAMILY  - Updates: No family bedside, reportedly they want to wait the full 14 days.  Will wait til next Monday then recommend withdrawal, not a candidate for trach/peg given extreme neurological injury.  - Inter-disciplinary family meet or Palliative Care meeting due by: 01/16/16 did not occur, family did not show up, see above.  Erick Colace ACNP-BC [place] Pager # [phone] OR # [phone] if no answer   01/21/2016, 8:21 AM

## 2016-01-21 NOTE — Procedures (Signed)
Extubation Procedure Note  Patient Details:   Name: RISHAV MAYE DOB: July 11, 1950 MRN: [id number]   Airway Documentation:     Evaluation  O2 sats: stable throughout Complications: No apparent complications Patient did tolerate procedure well. Bilateral breath sounds: Clear / Diminished   No  Pt. Was terminally extubated with RN at the bedside & family outside of the room. Pt. Was placed on 2L [place] & isn't in any distress at this time.  Lameka Disla, Margaretmary Dys 01/21/2016, 2:05 PM

## 2016-01-21 NOTE — Progress Notes (Signed)
eLink Physician-Brief Progress Note Patient Name: AASIN DENHART DOB: 1950/11/29 MRN: [id number]   Date of Service  01/21/2016  HPI/Events of Note  Patient is comfort measures on Morphine IV PRN.   eICU Interventions  Will order a Morphine IV infusion.      Intervention Category Intermediate Interventions: Other:  Lenell Antu 01/21/2016, 6:16 PM

## 2016-01-21 NOTE — Progress Notes (Signed)
ANTICOAGULATION CONSULT NOTE - Follow Up Consult  Pharmacy Consult for Heparin Indication: atrial fibrillation  No Known Allergies  Patient Measurements: Height: 6' (182.9 cm) Weight: 225 lb 8.5 oz (102.3 kg) IBW/kg (Calculated) : 77.6 Heparin Dosing Weight: 102.7kg  Vital Signs: Temp: 98.5 F (36.9 C) (07/22 0800) Temp Source: Oral (07/22 0800) BP: 116/56 mmHg (07/22 0800) Pulse Rate: 61 (07/22 0800)  Labs:  Recent Labs  01/19/16 0111  01/19/16 0112 01/20/16 0430 01/21/16 0315  HGB  --   < > 10.5* 11.0* 10.3*  HCT  --   --  34.0* 35.0* 32.4*  PLT  --   --  320 369 341  HEPARINUNFRC 0.60  --   --  0.56 0.46  CREATININE  --   --  1.96* 1.84* 1.64*  < > = values in this interval not displayed.  Estimated Creatinine Clearance: 57.1 mL/min (by C-G formula based on Cr of 1.64).    Assessment: 65yom on xarelto pta for afib now s/p cardiac arrest and transitioned to IV heparin. Heparin dripm 1000 uts/hr Heparin level 0.46 remains therapeutic. No bleeding issues noted. Hgb low but stable and PLT wnl.  Goal of Therapy:  Heparin level 0.3-0.7 units/ml Monitor platelets by anticoagulation protocol: Yes   Plan:  1) Continue heparin gtt 1000 units/hr 2) Daily HL and CBC  Leota Sauers Pharm.D. CPP, BCPS Clinical Pharmacist [phone] 01/21/2016 8:13 AM

## 2016-01-21 NOTE — Progress Notes (Signed)
[name] has approached me today. The family has come to terms w/ [name] events and is ready to discontinue life support today. We discussed the plan of care.  This included: As needed morphine Discontinuation of nutritional support Removal of ventilator Full DNR Comfort focused care.    Simonne Martinet ACNP-BC [facility] Pulmonary/Critical Care Pager # [phone] OR # [phone] if no answer

## 2016-01-24 ENCOUNTER — Encounter (HOSPITAL_COMMUNITY): Admission: RE | Payer: Self-pay | Source: Ambulatory Visit

## 2016-01-24 ENCOUNTER — Telehealth: Payer: Self-pay

## 2016-01-24 ENCOUNTER — Inpatient Hospital Stay (HOSPITAL_COMMUNITY): Admission: RE | Admit: 2016-01-24 | Payer: 59 | Source: Ambulatory Visit | Admitting: Orthopedic Surgery

## 2016-01-24 SURGERY — ARTHROPLASTY, KNEE, TOTAL
Anesthesia: General | Laterality: Right

## 2016-01-24 NOTE — Telephone Encounter (Signed)
On 01/29/2016 I received a death certificate from [employer] (original). The death certificate is for cremation. The patient is a patient of Doctor Vassie Loll. The death certificate will be taken to Pulmonary Unit @ [facility] this am for signature. On Jan 29, 2016 I received the death certificate back from Doctor Vassie Loll. I got the death certificate ready and called the funeral home to let them know the death certificate is ready for pickup.

## 2016-01-31 NOTE — Progress Notes (Signed)
Patient noted to be experiencing frequent and consistent PAC's with fluctuation of HR between 60-70's. SPO2 currently 72%. Family called and updated on present condition. RN will continue to monitor.

## 2016-01-31 DEATH — deceased

## 2016-02-06 IMAGING — CR DG CHEST 2V
2 series · 2 of 2 positions shown · non-contrast
Comparison: 07/25/2013

CLINICAL DATA: Preop hypertension.

EXAM:
CHEST  2 VIEW

[w chest pa]
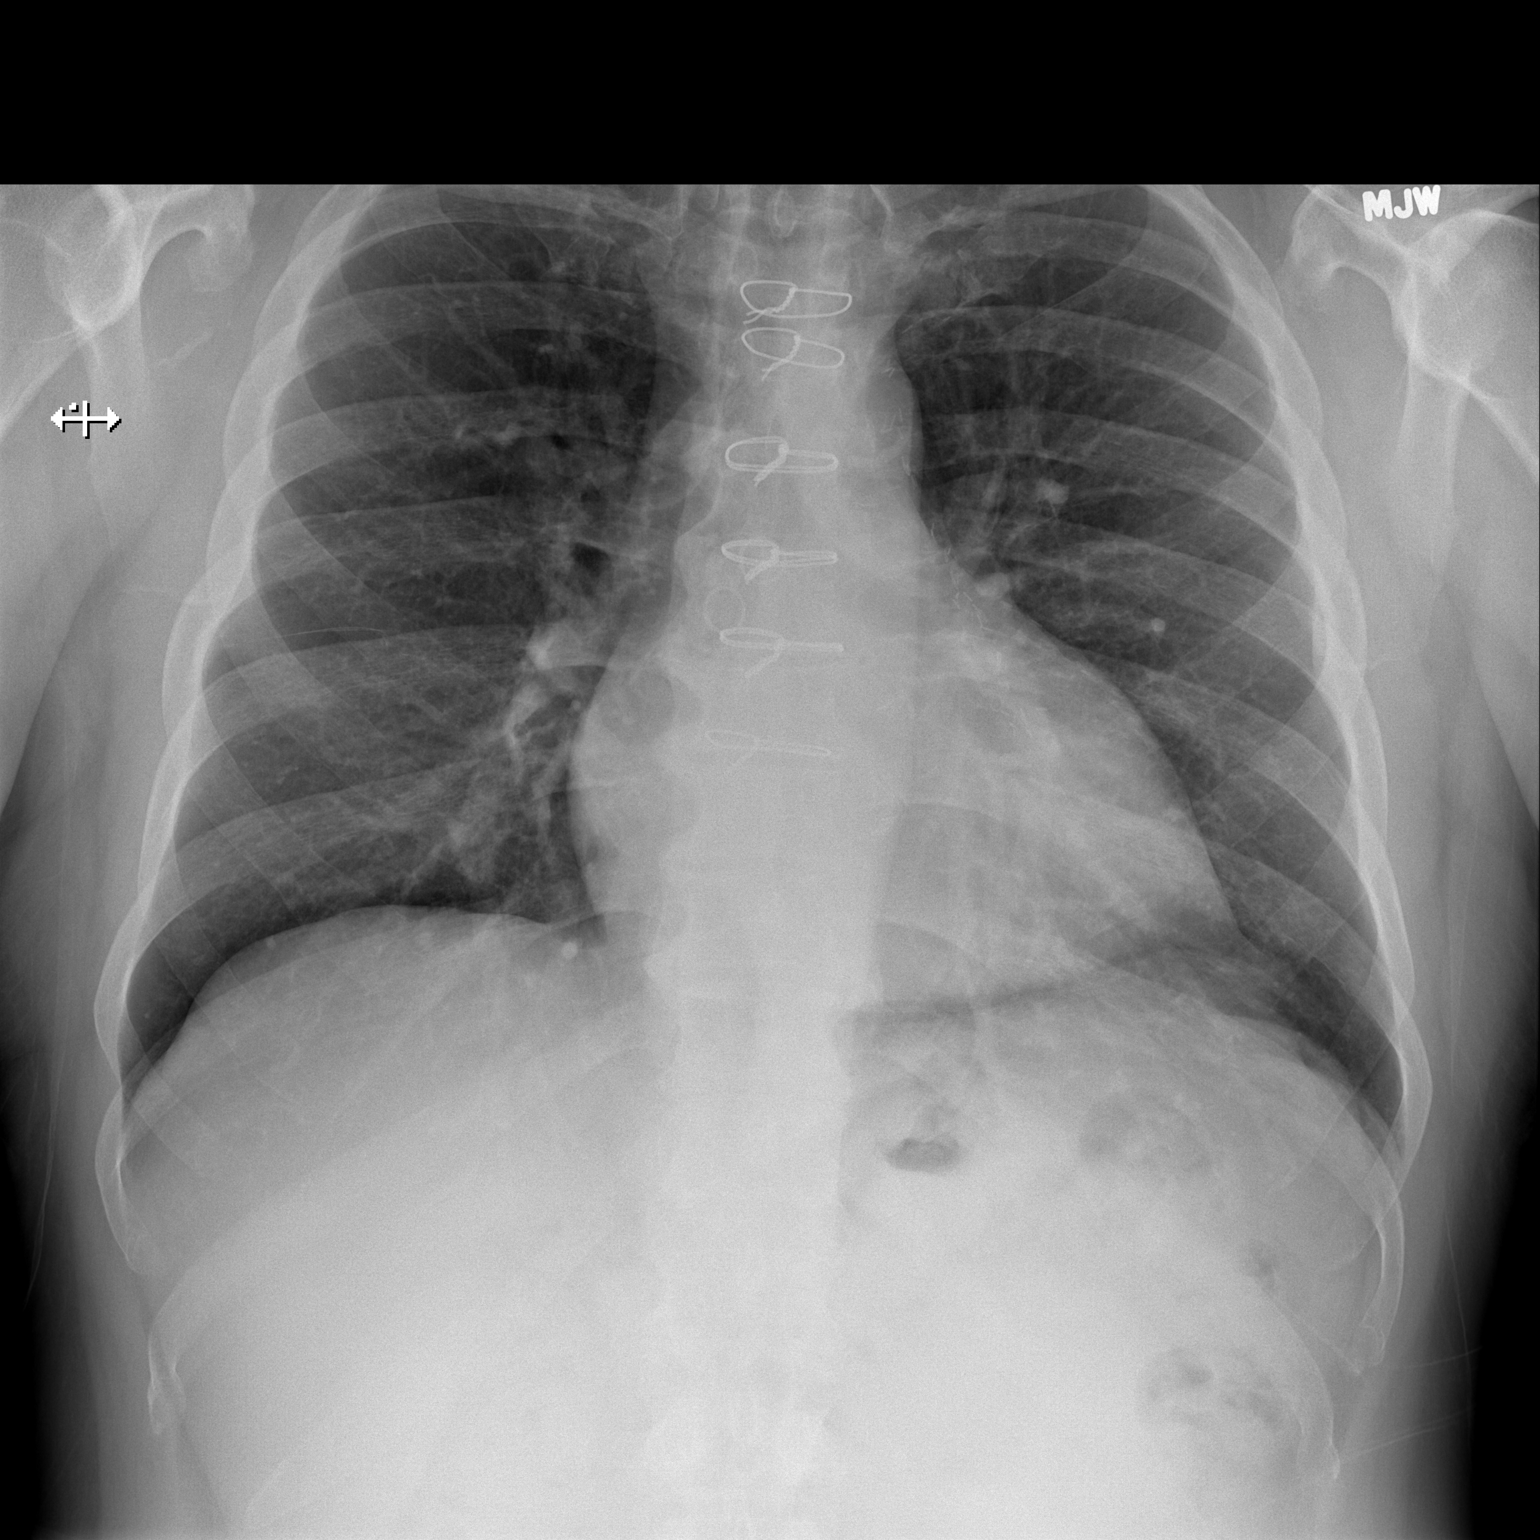

[w chest lat]
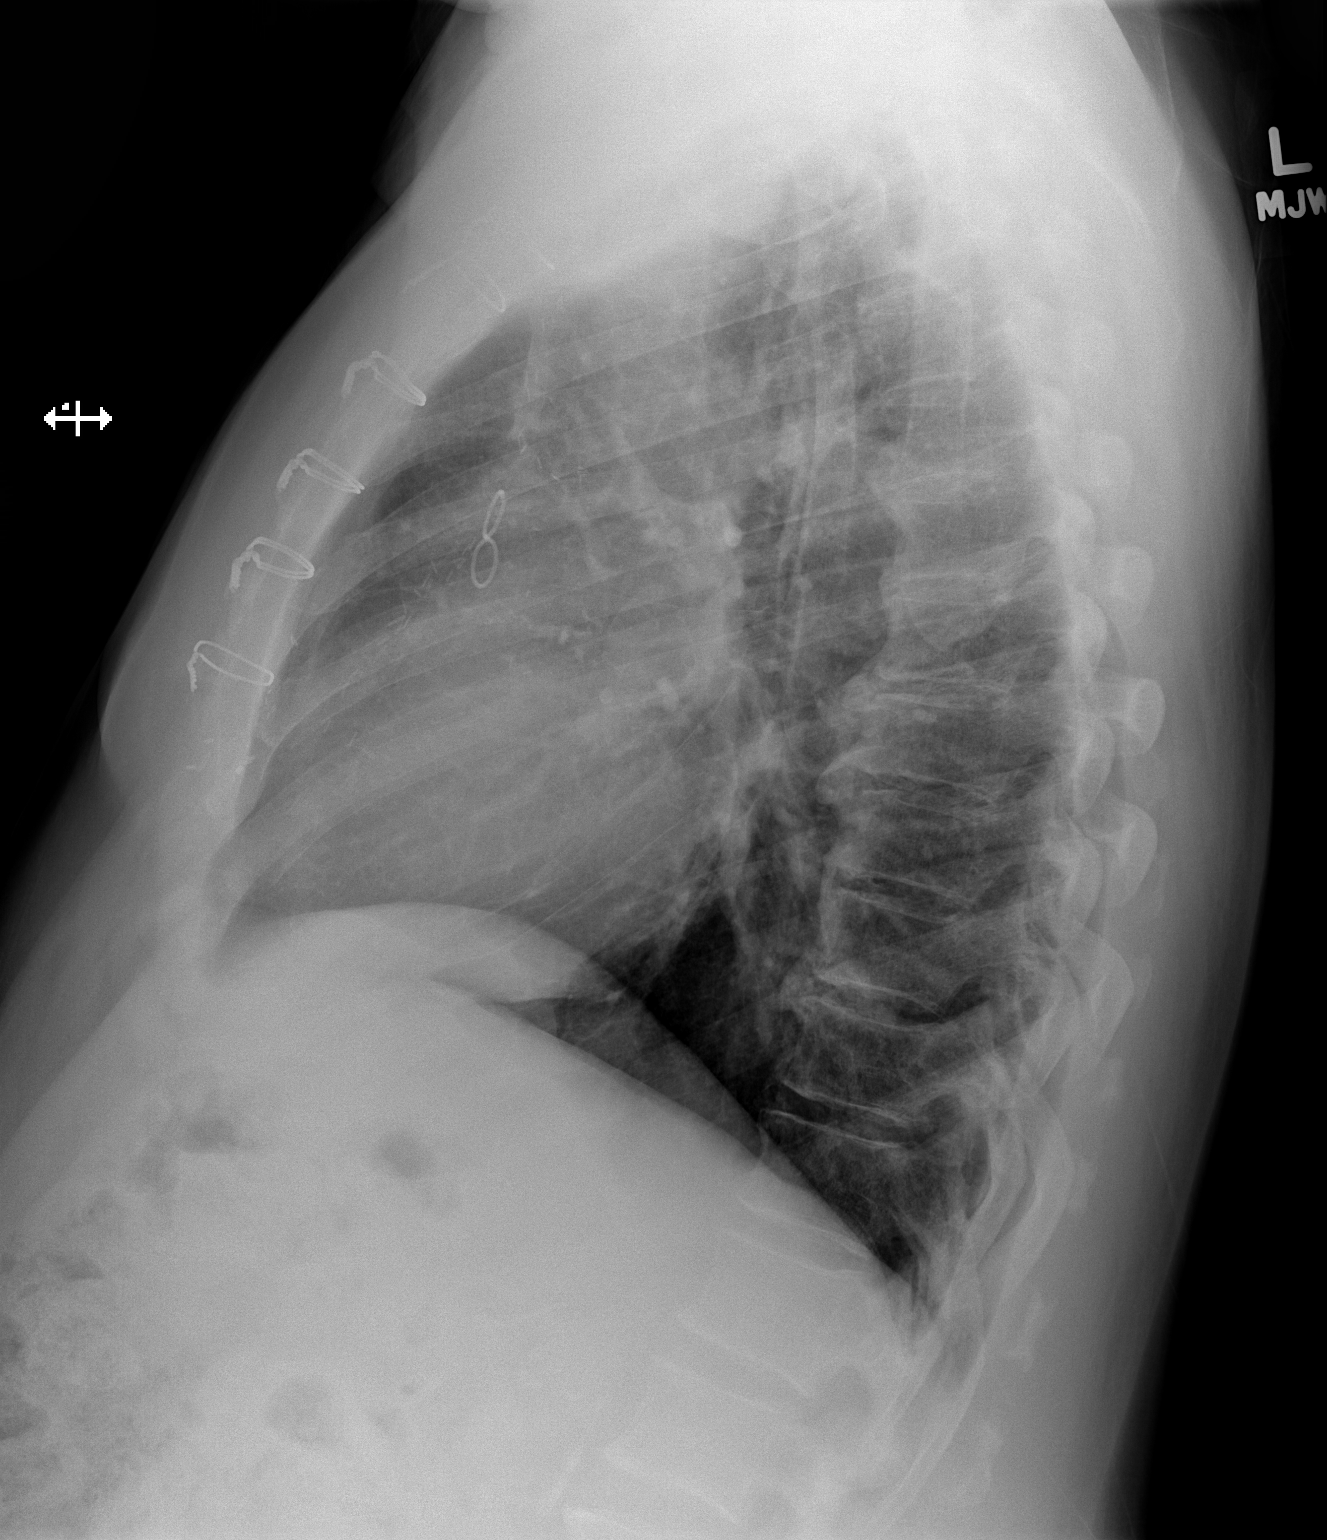

[2 of 2 positions shown; findings below may reference images not displayed]

FINDINGS: Prior CABG. Heart is upper limits normal in size. Lungs are clear.
No effusions or acute bony abnormality.
IMPRESSION: No active cardiopulmonary disease.

## 2016-02-10 ENCOUNTER — Ambulatory Visit: Payer: 59 | Admitting: Family

## 2016-02-10 ENCOUNTER — Encounter (HOSPITAL_COMMUNITY): Payer: 59

## 2016-02-10 ENCOUNTER — Other Ambulatory Visit (HOSPITAL_COMMUNITY): Payer: 59

## 2016-02-14 ENCOUNTER — Ambulatory Visit: Payer: 59 | Admitting: Podiatry

## 2016-03-02 ENCOUNTER — Other Ambulatory Visit: Payer: 59

## 2016-03-02 NOTE — Discharge Summary (Signed)
65 y/o male with CAD, CHF had a cardiac arrest (PEA/Vfib) on 7/10 and received prolonged CPR.  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 FCCP. [facility]   02/01/2016

## 2016-03-08 ENCOUNTER — Ambulatory Visit: Payer: 59 | Admitting: Endocrinology
